# Patient Record
Sex: Female | Born: 1945
Health system: Southern US, Community
[De-identification: ages and names within clinical notes are randomized; demographics above are authoritative.]

## PROBLEM LIST (undated history)

## (undated) DIAGNOSIS — Z95828 Presence of other vascular implants and grafts: Secondary | ICD-10-CM

## (undated) DIAGNOSIS — Z9289 Personal history of other medical treatment: Secondary | ICD-10-CM

## (undated) DIAGNOSIS — E669 Obesity, unspecified: Secondary | ICD-10-CM

## (undated) DIAGNOSIS — I700 Atherosclerosis of aorta: Secondary | ICD-10-CM

## (undated) DIAGNOSIS — D696 Thrombocytopenia, unspecified: Secondary | ICD-10-CM

## (undated) DIAGNOSIS — R011 Cardiac murmur, unspecified: Secondary | ICD-10-CM

## (undated) DIAGNOSIS — I10 Essential (primary) hypertension: Secondary | ICD-10-CM

## (undated) DIAGNOSIS — N135 Crossing vessel and stricture of ureter without hydronephrosis: Secondary | ICD-10-CM

## (undated) DIAGNOSIS — R338 Other retention of urine: Secondary | ICD-10-CM

## (undated) DIAGNOSIS — I5189 Other ill-defined heart diseases: Secondary | ICD-10-CM

## (undated) DIAGNOSIS — Z9221 Personal history of antineoplastic chemotherapy: Secondary | ICD-10-CM

## (undated) DIAGNOSIS — M109 Gout, unspecified: Secondary | ICD-10-CM

## (undated) DIAGNOSIS — M75100 Unspecified rotator cuff tear or rupture of unspecified shoulder, not specified as traumatic: Secondary | ICD-10-CM

## (undated) DIAGNOSIS — I519 Heart disease, unspecified: Secondary | ICD-10-CM

## (undated) DIAGNOSIS — G709 Myoneural disorder, unspecified: Secondary | ICD-10-CM

## (undated) DIAGNOSIS — N1330 Unspecified hydronephrosis: Secondary | ICD-10-CM

## (undated) DIAGNOSIS — D759 Disease of blood and blood-forming organs, unspecified: Secondary | ICD-10-CM

## (undated) DIAGNOSIS — E785 Hyperlipidemia, unspecified: Secondary | ICD-10-CM

## (undated) DIAGNOSIS — I959 Hypotension, unspecified: Secondary | ICD-10-CM

## (undated) DIAGNOSIS — C8330 Diffuse large B-cell lymphoma, unspecified site: Secondary | ICD-10-CM

## (undated) DIAGNOSIS — R112 Nausea with vomiting, unspecified: Secondary | ICD-10-CM

## (undated) DIAGNOSIS — D539 Nutritional anemia, unspecified: Secondary | ICD-10-CM

## (undated) DIAGNOSIS — N2889 Other specified disorders of kidney and ureter: Secondary | ICD-10-CM

## (undated) DIAGNOSIS — M069 Rheumatoid arthritis, unspecified: Secondary | ICD-10-CM

## (undated) DIAGNOSIS — R591 Generalized enlarged lymph nodes: Secondary | ICD-10-CM

## (undated) DIAGNOSIS — Z8709 Personal history of other diseases of the respiratory system: Secondary | ICD-10-CM

## (undated) DIAGNOSIS — M7989 Other specified soft tissue disorders: Secondary | ICD-10-CM

## (undated) DIAGNOSIS — R161 Splenomegaly, not elsewhere classified: Secondary | ICD-10-CM

## (undated) DIAGNOSIS — H269 Unspecified cataract: Secondary | ICD-10-CM

## (undated) DIAGNOSIS — N179 Acute kidney failure, unspecified: Secondary | ICD-10-CM

## (undated) DIAGNOSIS — J90 Pleural effusion, not elsewhere classified: Secondary | ICD-10-CM

## (undated) DIAGNOSIS — A419 Sepsis, unspecified organism: Secondary | ICD-10-CM

## (undated) DIAGNOSIS — Z8719 Personal history of other diseases of the digestive system: Secondary | ICD-10-CM

## (undated) HISTORY — PX: APPENDECTOMY: SHX54

## (undated) HISTORY — PX: BREAST SURGERY: SHX581

## (undated) HISTORY — PX: OTHER SURGICAL HISTORY: SHX169

## (undated) HISTORY — PX: CATARACT EXTRACTION, BILATERAL: SHX1313

## (undated) HISTORY — DX: Other retention of urine: R33.8

## (undated) HISTORY — PX: LAPAROSCOPIC SALPINGO OOPHERECTOMY: SHX5927

## (undated) HISTORY — DX: Hypotension, unspecified: I95.9

## (undated) HISTORY — PX: CARPAL TUNNEL RELEASE: SHX101

## (undated) HISTORY — DX: Sepsis, unspecified organism: A41.9

## (undated) HISTORY — DX: Hypercalcemia: E83.52

## (undated) HISTORY — DX: Hypomagnesemia: E83.42

## (undated) HISTORY — DX: Other disorders of bilirubin metabolism: E80.6

## (undated) HISTORY — DX: Diffuse large B-cell lymphoma, unspecified site: C83.30

## (undated) HISTORY — DX: Disease of blood and blood-forming organs, unspecified: D75.9

## (undated) HISTORY — DX: Nutritional anemia, unspecified: D53.9

## (undated) HISTORY — DX: Acute kidney failure, unspecified: N17.9

## (undated) HISTORY — DX: Thrombocytopenia, unspecified: D69.6

## (undated) HISTORY — DX: Nausea with vomiting, unspecified: R11.2

## (undated) HISTORY — DX: Other disorders of phosphorus metabolism: E83.39

## (undated) HISTORY — DX: Generalized enlarged lymph nodes: R59.1

---

## 2011-12-21 DIAGNOSIS — Z86010 Personal history of colonic polyps: Secondary | ICD-10-CM | POA: Diagnosis not present

## 2011-12-21 DIAGNOSIS — K5730 Diverticulosis of large intestine without perforation or abscess without bleeding: Secondary | ICD-10-CM | POA: Diagnosis not present

## 2012-03-04 DIAGNOSIS — Z79899 Other long term (current) drug therapy: Secondary | ICD-10-CM | POA: Diagnosis not present

## 2012-03-04 DIAGNOSIS — M069 Rheumatoid arthritis, unspecified: Secondary | ICD-10-CM | POA: Diagnosis not present

## 2012-03-04 DIAGNOSIS — M109 Gout, unspecified: Secondary | ICD-10-CM | POA: Diagnosis not present

## 2012-03-04 DIAGNOSIS — M6530 Trigger finger, unspecified finger: Secondary | ICD-10-CM | POA: Diagnosis not present

## 2012-03-07 DIAGNOSIS — M109 Gout, unspecified: Secondary | ICD-10-CM | POA: Diagnosis not present

## 2012-03-07 DIAGNOSIS — M069 Rheumatoid arthritis, unspecified: Secondary | ICD-10-CM | POA: Diagnosis not present

## 2012-03-07 DIAGNOSIS — Z79899 Other long term (current) drug therapy: Secondary | ICD-10-CM | POA: Diagnosis not present

## 2012-04-25 DIAGNOSIS — I10 Essential (primary) hypertension: Secondary | ICD-10-CM | POA: Diagnosis not present

## 2012-04-25 DIAGNOSIS — Z79899 Other long term (current) drug therapy: Secondary | ICD-10-CM | POA: Diagnosis not present

## 2012-04-25 DIAGNOSIS — E785 Hyperlipidemia, unspecified: Secondary | ICD-10-CM | POA: Diagnosis not present

## 2012-05-07 DIAGNOSIS — H16049 Marginal corneal ulcer, unspecified eye: Secondary | ICD-10-CM | POA: Diagnosis not present

## 2012-05-21 DIAGNOSIS — M109 Gout, unspecified: Secondary | ICD-10-CM | POA: Diagnosis not present

## 2012-05-21 DIAGNOSIS — E785 Hyperlipidemia, unspecified: Secondary | ICD-10-CM | POA: Diagnosis not present

## 2012-05-21 DIAGNOSIS — Z79899 Other long term (current) drug therapy: Secondary | ICD-10-CM | POA: Diagnosis not present

## 2012-05-21 DIAGNOSIS — M069 Rheumatoid arthritis, unspecified: Secondary | ICD-10-CM | POA: Diagnosis not present

## 2012-05-29 DIAGNOSIS — M542 Cervicalgia: Secondary | ICD-10-CM | POA: Diagnosis not present

## 2012-05-29 DIAGNOSIS — R079 Chest pain, unspecified: Secondary | ICD-10-CM | POA: Diagnosis not present

## 2012-07-29 DIAGNOSIS — R6889 Other general symptoms and signs: Secondary | ICD-10-CM | POA: Diagnosis not present

## 2012-07-29 DIAGNOSIS — M069 Rheumatoid arthritis, unspecified: Secondary | ICD-10-CM | POA: Diagnosis not present

## 2012-07-29 DIAGNOSIS — M6530 Trigger finger, unspecified finger: Secondary | ICD-10-CM | POA: Diagnosis not present

## 2012-07-29 DIAGNOSIS — M109 Gout, unspecified: Secondary | ICD-10-CM | POA: Diagnosis not present

## 2012-08-21 DIAGNOSIS — Z79899 Other long term (current) drug therapy: Secondary | ICD-10-CM | POA: Diagnosis not present

## 2012-08-21 DIAGNOSIS — M069 Rheumatoid arthritis, unspecified: Secondary | ICD-10-CM | POA: Diagnosis not present

## 2012-09-18 DIAGNOSIS — R062 Wheezing: Secondary | ICD-10-CM | POA: Diagnosis not present

## 2012-09-18 DIAGNOSIS — J209 Acute bronchitis, unspecified: Secondary | ICD-10-CM | POA: Diagnosis not present

## 2012-09-18 DIAGNOSIS — R05 Cough: Secondary | ICD-10-CM | POA: Diagnosis not present

## 2012-10-08 DIAGNOSIS — H524 Presbyopia: Secondary | ICD-10-CM | POA: Diagnosis not present

## 2012-10-08 DIAGNOSIS — H269 Unspecified cataract: Secondary | ICD-10-CM | POA: Diagnosis not present

## 2012-11-19 DIAGNOSIS — L820 Inflamed seborrheic keratosis: Secondary | ICD-10-CM | POA: Diagnosis not present

## 2012-11-19 DIAGNOSIS — D2360 Other benign neoplasm of skin of unspecified upper limb, including shoulder: Secondary | ICD-10-CM | POA: Diagnosis not present

## 2012-12-02 DIAGNOSIS — Z79899 Other long term (current) drug therapy: Secondary | ICD-10-CM | POA: Diagnosis not present

## 2012-12-02 DIAGNOSIS — Z124 Encounter for screening for malignant neoplasm of cervix: Secondary | ICD-10-CM | POA: Diagnosis not present

## 2012-12-02 DIAGNOSIS — Z1231 Encounter for screening mammogram for malignant neoplasm of breast: Secondary | ICD-10-CM | POA: Diagnosis not present

## 2012-12-02 DIAGNOSIS — Z0000 Encounter for general adult medical examination without abnormal findings: Secondary | ICD-10-CM | POA: Diagnosis not present

## 2012-12-02 DIAGNOSIS — E785 Hyperlipidemia, unspecified: Secondary | ICD-10-CM | POA: Diagnosis not present

## 2012-12-02 DIAGNOSIS — M069 Rheumatoid arthritis, unspecified: Secondary | ICD-10-CM | POA: Diagnosis not present

## 2012-12-02 DIAGNOSIS — I10 Essential (primary) hypertension: Secondary | ICD-10-CM | POA: Diagnosis not present

## 2012-12-17 DIAGNOSIS — Z1231 Encounter for screening mammogram for malignant neoplasm of breast: Secondary | ICD-10-CM | POA: Diagnosis not present

## 2012-12-30 DIAGNOSIS — M109 Gout, unspecified: Secondary | ICD-10-CM | POA: Diagnosis not present

## 2012-12-30 DIAGNOSIS — M069 Rheumatoid arthritis, unspecified: Secondary | ICD-10-CM | POA: Diagnosis not present

## 2012-12-30 DIAGNOSIS — Z79899 Other long term (current) drug therapy: Secondary | ICD-10-CM | POA: Diagnosis not present

## 2013-03-06 DIAGNOSIS — J351 Hypertrophy of tonsils: Secondary | ICD-10-CM | POA: Diagnosis not present

## 2013-03-06 DIAGNOSIS — H6120 Impacted cerumen, unspecified ear: Secondary | ICD-10-CM | POA: Diagnosis not present

## 2013-03-06 DIAGNOSIS — H9319 Tinnitus, unspecified ear: Secondary | ICD-10-CM | POA: Diagnosis not present

## 2013-03-06 DIAGNOSIS — H9190 Unspecified hearing loss, unspecified ear: Secondary | ICD-10-CM | POA: Diagnosis not present

## 2013-03-10 DIAGNOSIS — M069 Rheumatoid arthritis, unspecified: Secondary | ICD-10-CM | POA: Diagnosis not present

## 2013-03-10 DIAGNOSIS — Z79899 Other long term (current) drug therapy: Secondary | ICD-10-CM | POA: Diagnosis not present

## 2013-05-20 DIAGNOSIS — M109 Gout, unspecified: Secondary | ICD-10-CM | POA: Diagnosis not present

## 2013-05-20 DIAGNOSIS — Z79899 Other long term (current) drug therapy: Secondary | ICD-10-CM | POA: Diagnosis not present

## 2013-05-20 DIAGNOSIS — M069 Rheumatoid arthritis, unspecified: Secondary | ICD-10-CM | POA: Diagnosis not present

## 2013-05-20 DIAGNOSIS — M6530 Trigger finger, unspecified finger: Secondary | ICD-10-CM | POA: Diagnosis not present

## 2013-06-02 DIAGNOSIS — E785 Hyperlipidemia, unspecified: Secondary | ICD-10-CM | POA: Diagnosis not present

## 2013-06-02 DIAGNOSIS — I10 Essential (primary) hypertension: Secondary | ICD-10-CM | POA: Diagnosis not present

## 2013-06-11 DIAGNOSIS — M6530 Trigger finger, unspecified finger: Secondary | ICD-10-CM | POA: Diagnosis not present

## 2013-06-11 DIAGNOSIS — M069 Rheumatoid arthritis, unspecified: Secondary | ICD-10-CM | POA: Diagnosis not present

## 2013-06-11 DIAGNOSIS — Z79899 Other long term (current) drug therapy: Secondary | ICD-10-CM | POA: Diagnosis not present

## 2013-06-11 DIAGNOSIS — M109 Gout, unspecified: Secondary | ICD-10-CM | POA: Diagnosis not present

## 2013-06-11 DIAGNOSIS — E785 Hyperlipidemia, unspecified: Secondary | ICD-10-CM | POA: Diagnosis not present

## 2013-06-24 DIAGNOSIS — H43819 Vitreous degeneration, unspecified eye: Secondary | ICD-10-CM | POA: Diagnosis not present

## 2013-09-12 DIAGNOSIS — Z23 Encounter for immunization: Secondary | ICD-10-CM | POA: Diagnosis not present

## 2013-09-17 DIAGNOSIS — M109 Gout, unspecified: Secondary | ICD-10-CM | POA: Diagnosis not present

## 2013-09-17 DIAGNOSIS — M6530 Trigger finger, unspecified finger: Secondary | ICD-10-CM | POA: Diagnosis not present

## 2013-09-17 DIAGNOSIS — M069 Rheumatoid arthritis, unspecified: Secondary | ICD-10-CM | POA: Diagnosis not present

## 2013-09-17 DIAGNOSIS — Z79899 Other long term (current) drug therapy: Secondary | ICD-10-CM | POA: Diagnosis not present

## 2013-10-07 DIAGNOSIS — M542 Cervicalgia: Secondary | ICD-10-CM | POA: Diagnosis not present

## 2013-10-09 DIAGNOSIS — H269 Unspecified cataract: Secondary | ICD-10-CM | POA: Diagnosis not present

## 2013-10-09 DIAGNOSIS — H43819 Vitreous degeneration, unspecified eye: Secondary | ICD-10-CM | POA: Diagnosis not present

## 2013-10-09 DIAGNOSIS — H524 Presbyopia: Secondary | ICD-10-CM | POA: Diagnosis not present

## 2013-10-21 DIAGNOSIS — M6530 Trigger finger, unspecified finger: Secondary | ICD-10-CM | POA: Diagnosis not present

## 2013-10-21 DIAGNOSIS — M109 Gout, unspecified: Secondary | ICD-10-CM | POA: Diagnosis not present

## 2013-10-21 DIAGNOSIS — M542 Cervicalgia: Secondary | ICD-10-CM | POA: Diagnosis not present

## 2013-10-21 DIAGNOSIS — M069 Rheumatoid arthritis, unspecified: Secondary | ICD-10-CM | POA: Diagnosis not present

## 2013-10-23 DIAGNOSIS — M5030 Other cervical disc degeneration, unspecified cervical region: Secondary | ICD-10-CM | POA: Diagnosis not present

## 2013-10-23 DIAGNOSIS — M47812 Spondylosis without myelopathy or radiculopathy, cervical region: Secondary | ICD-10-CM | POA: Diagnosis not present

## 2013-10-23 DIAGNOSIS — M542 Cervicalgia: Secondary | ICD-10-CM | POA: Diagnosis not present

## 2013-11-10 DIAGNOSIS — M5412 Radiculopathy, cervical region: Secondary | ICD-10-CM | POA: Diagnosis not present

## 2013-11-10 DIAGNOSIS — M542 Cervicalgia: Secondary | ICD-10-CM | POA: Diagnosis not present

## 2013-12-25 DIAGNOSIS — Z0000 Encounter for general adult medical examination without abnormal findings: Secondary | ICD-10-CM | POA: Diagnosis not present

## 2013-12-25 DIAGNOSIS — I10 Essential (primary) hypertension: Secondary | ICD-10-CM | POA: Diagnosis not present

## 2013-12-25 DIAGNOSIS — E785 Hyperlipidemia, unspecified: Secondary | ICD-10-CM | POA: Diagnosis not present

## 2013-12-25 DIAGNOSIS — Z1231 Encounter for screening mammogram for malignant neoplasm of breast: Secondary | ICD-10-CM | POA: Diagnosis not present

## 2013-12-25 DIAGNOSIS — M069 Rheumatoid arthritis, unspecified: Secondary | ICD-10-CM | POA: Diagnosis not present

## 2013-12-25 DIAGNOSIS — Z79899 Other long term (current) drug therapy: Secondary | ICD-10-CM | POA: Diagnosis not present

## 2013-12-31 DIAGNOSIS — Z23 Encounter for immunization: Secondary | ICD-10-CM | POA: Diagnosis not present

## 2013-12-31 DIAGNOSIS — Z1231 Encounter for screening mammogram for malignant neoplasm of breast: Secondary | ICD-10-CM | POA: Diagnosis not present

## 2014-03-05 DIAGNOSIS — M069 Rheumatoid arthritis, unspecified: Secondary | ICD-10-CM | POA: Diagnosis not present

## 2014-03-05 DIAGNOSIS — M109 Gout, unspecified: Secondary | ICD-10-CM | POA: Diagnosis not present

## 2014-03-05 DIAGNOSIS — M542 Cervicalgia: Secondary | ICD-10-CM | POA: Diagnosis not present

## 2014-03-25 DIAGNOSIS — Z79899 Other long term (current) drug therapy: Secondary | ICD-10-CM | POA: Diagnosis not present

## 2014-03-25 DIAGNOSIS — M069 Rheumatoid arthritis, unspecified: Secondary | ICD-10-CM | POA: Diagnosis not present

## 2014-04-15 DIAGNOSIS — D2360 Other benign neoplasm of skin of unspecified upper limb, including shoulder: Secondary | ICD-10-CM | POA: Diagnosis not present

## 2014-04-15 DIAGNOSIS — L821 Other seborrheic keratosis: Secondary | ICD-10-CM | POA: Diagnosis not present

## 2014-05-27 DIAGNOSIS — Z79899 Other long term (current) drug therapy: Secondary | ICD-10-CM | POA: Diagnosis not present

## 2014-05-27 DIAGNOSIS — I10 Essential (primary) hypertension: Secondary | ICD-10-CM | POA: Diagnosis not present

## 2014-05-27 DIAGNOSIS — E785 Hyperlipidemia, unspecified: Secondary | ICD-10-CM | POA: Diagnosis not present

## 2014-06-16 DIAGNOSIS — M069 Rheumatoid arthritis, unspecified: Secondary | ICD-10-CM | POA: Diagnosis not present

## 2014-06-16 DIAGNOSIS — Z79899 Other long term (current) drug therapy: Secondary | ICD-10-CM | POA: Diagnosis not present

## 2014-06-16 DIAGNOSIS — E785 Hyperlipidemia, unspecified: Secondary | ICD-10-CM | POA: Diagnosis not present

## 2014-08-05 DIAGNOSIS — M069 Rheumatoid arthritis, unspecified: Secondary | ICD-10-CM | POA: Diagnosis not present

## 2014-08-05 DIAGNOSIS — M25449 Effusion, unspecified hand: Secondary | ICD-10-CM | POA: Diagnosis not present

## 2014-08-05 DIAGNOSIS — M109 Gout, unspecified: Secondary | ICD-10-CM | POA: Diagnosis not present

## 2014-08-05 DIAGNOSIS — R7301 Impaired fasting glucose: Secondary | ICD-10-CM | POA: Diagnosis not present

## 2014-09-04 DIAGNOSIS — Z23 Encounter for immunization: Secondary | ICD-10-CM | POA: Diagnosis not present

## 2014-09-10 DIAGNOSIS — M109 Gout, unspecified: Secondary | ICD-10-CM | POA: Diagnosis not present

## 2014-09-10 DIAGNOSIS — R7301 Impaired fasting glucose: Secondary | ICD-10-CM | POA: Diagnosis not present

## 2014-09-10 DIAGNOSIS — M25449 Effusion, unspecified hand: Secondary | ICD-10-CM | POA: Diagnosis not present

## 2014-09-10 DIAGNOSIS — M069 Rheumatoid arthritis, unspecified: Secondary | ICD-10-CM | POA: Diagnosis not present

## 2014-09-10 DIAGNOSIS — Z79899 Other long term (current) drug therapy: Secondary | ICD-10-CM | POA: Diagnosis not present

## 2014-10-08 DIAGNOSIS — H2513 Age-related nuclear cataract, bilateral: Secondary | ICD-10-CM | POA: Diagnosis not present

## 2014-12-17 DIAGNOSIS — I10 Essential (primary) hypertension: Secondary | ICD-10-CM | POA: Diagnosis not present

## 2014-12-17 DIAGNOSIS — R7301 Impaired fasting glucose: Secondary | ICD-10-CM | POA: Diagnosis not present

## 2014-12-17 DIAGNOSIS — M25449 Effusion, unspecified hand: Secondary | ICD-10-CM | POA: Diagnosis not present

## 2014-12-17 DIAGNOSIS — Z79899 Other long term (current) drug therapy: Secondary | ICD-10-CM | POA: Diagnosis not present

## 2014-12-17 DIAGNOSIS — E785 Hyperlipidemia, unspecified: Secondary | ICD-10-CM | POA: Diagnosis not present

## 2014-12-17 DIAGNOSIS — M1000 Idiopathic gout, unspecified site: Secondary | ICD-10-CM | POA: Diagnosis not present

## 2014-12-17 DIAGNOSIS — M069 Rheumatoid arthritis, unspecified: Secondary | ICD-10-CM | POA: Diagnosis not present

## 2014-12-29 DIAGNOSIS — Z1231 Encounter for screening mammogram for malignant neoplasm of breast: Secondary | ICD-10-CM | POA: Diagnosis not present

## 2014-12-29 DIAGNOSIS — E785 Hyperlipidemia, unspecified: Secondary | ICD-10-CM | POA: Diagnosis not present

## 2014-12-29 DIAGNOSIS — R8290 Unspecified abnormal findings in urine: Secondary | ICD-10-CM | POA: Diagnosis not present

## 2014-12-29 DIAGNOSIS — M069 Rheumatoid arthritis, unspecified: Secondary | ICD-10-CM | POA: Diagnosis not present

## 2014-12-29 DIAGNOSIS — I10 Essential (primary) hypertension: Secondary | ICD-10-CM | POA: Diagnosis not present

## 2014-12-29 DIAGNOSIS — Z0001 Encounter for general adult medical examination with abnormal findings: Secondary | ICD-10-CM | POA: Diagnosis not present

## 2014-12-29 DIAGNOSIS — R7301 Impaired fasting glucose: Secondary | ICD-10-CM | POA: Diagnosis not present

## 2014-12-29 DIAGNOSIS — Z124 Encounter for screening for malignant neoplasm of cervix: Secondary | ICD-10-CM | POA: Diagnosis not present

## 2015-01-01 DIAGNOSIS — Z1231 Encounter for screening mammogram for malignant neoplasm of breast: Secondary | ICD-10-CM | POA: Diagnosis not present

## 2015-01-05 DIAGNOSIS — R634 Abnormal weight loss: Secondary | ICD-10-CM | POA: Diagnosis not present

## 2015-01-05 DIAGNOSIS — Z7952 Long term (current) use of systemic steroids: Secondary | ICD-10-CM | POA: Diagnosis not present

## 2015-01-05 DIAGNOSIS — M109 Gout, unspecified: Secondary | ICD-10-CM | POA: Diagnosis not present

## 2015-01-05 DIAGNOSIS — M069 Rheumatoid arthritis, unspecified: Secondary | ICD-10-CM | POA: Diagnosis not present

## 2015-01-05 DIAGNOSIS — M7062 Trochanteric bursitis, left hip: Secondary | ICD-10-CM | POA: Diagnosis not present

## 2015-01-05 DIAGNOSIS — M25441 Effusion, right hand: Secondary | ICD-10-CM | POA: Diagnosis not present

## 2015-03-01 DIAGNOSIS — D125 Benign neoplasm of sigmoid colon: Secondary | ICD-10-CM | POA: Diagnosis not present

## 2015-03-01 DIAGNOSIS — D122 Benign neoplasm of ascending colon: Secondary | ICD-10-CM | POA: Diagnosis not present

## 2015-03-01 DIAGNOSIS — Z1211 Encounter for screening for malignant neoplasm of colon: Secondary | ICD-10-CM | POA: Diagnosis not present

## 2015-03-01 DIAGNOSIS — Z86010 Personal history of colonic polyps: Secondary | ICD-10-CM | POA: Diagnosis not present

## 2015-03-01 DIAGNOSIS — K5730 Diverticulosis of large intestine without perforation or abscess without bleeding: Secondary | ICD-10-CM | POA: Diagnosis not present

## 2015-03-16 DIAGNOSIS — M7062 Trochanteric bursitis, left hip: Secondary | ICD-10-CM | POA: Diagnosis not present

## 2015-03-16 DIAGNOSIS — M0609 Rheumatoid arthritis without rheumatoid factor, multiple sites: Secondary | ICD-10-CM | POA: Diagnosis not present

## 2015-03-16 DIAGNOSIS — M25441 Effusion, right hand: Secondary | ICD-10-CM | POA: Diagnosis not present

## 2015-03-16 DIAGNOSIS — Z7952 Long term (current) use of systemic steroids: Secondary | ICD-10-CM | POA: Diagnosis not present

## 2015-03-16 DIAGNOSIS — R7301 Impaired fasting glucose: Secondary | ICD-10-CM | POA: Diagnosis not present

## 2015-03-16 DIAGNOSIS — E785 Hyperlipidemia, unspecified: Secondary | ICD-10-CM | POA: Diagnosis not present

## 2015-03-22 DIAGNOSIS — L728 Other follicular cysts of the skin and subcutaneous tissue: Secondary | ICD-10-CM | POA: Diagnosis not present

## 2015-04-05 DIAGNOSIS — M25441 Effusion, right hand: Secondary | ICD-10-CM | POA: Diagnosis not present

## 2015-04-05 DIAGNOSIS — J069 Acute upper respiratory infection, unspecified: Secondary | ICD-10-CM | POA: Diagnosis not present

## 2015-04-05 DIAGNOSIS — M059 Rheumatoid arthritis with rheumatoid factor, unspecified: Secondary | ICD-10-CM | POA: Diagnosis not present

## 2015-04-05 DIAGNOSIS — R634 Abnormal weight loss: Secondary | ICD-10-CM | POA: Diagnosis not present

## 2015-04-05 DIAGNOSIS — M109 Gout, unspecified: Secondary | ICD-10-CM | POA: Diagnosis not present

## 2015-04-19 DIAGNOSIS — R634 Abnormal weight loss: Secondary | ICD-10-CM | POA: Diagnosis not present

## 2015-04-19 DIAGNOSIS — M25441 Effusion, right hand: Secondary | ICD-10-CM | POA: Diagnosis not present

## 2015-04-19 DIAGNOSIS — M0570 Rheumatoid arthritis with rheumatoid factor of unspecified site without organ or systems involvement: Secondary | ICD-10-CM | POA: Diagnosis not present

## 2015-04-19 DIAGNOSIS — K069 Disorder of gingiva and edentulous alveolar ridge, unspecified: Secondary | ICD-10-CM | POA: Diagnosis not present

## 2015-07-12 DIAGNOSIS — M109 Gout, unspecified: Secondary | ICD-10-CM | POA: Diagnosis not present

## 2015-07-12 DIAGNOSIS — Z79899 Other long term (current) drug therapy: Secondary | ICD-10-CM | POA: Diagnosis not present

## 2015-07-12 DIAGNOSIS — E785 Hyperlipidemia, unspecified: Secondary | ICD-10-CM | POA: Diagnosis not present

## 2015-07-12 DIAGNOSIS — M069 Rheumatoid arthritis, unspecified: Secondary | ICD-10-CM | POA: Diagnosis not present

## 2015-07-12 DIAGNOSIS — I10 Essential (primary) hypertension: Secondary | ICD-10-CM | POA: Diagnosis not present

## 2015-08-10 DIAGNOSIS — Z79899 Other long term (current) drug therapy: Secondary | ICD-10-CM | POA: Diagnosis not present

## 2015-08-10 DIAGNOSIS — M25431 Effusion, right wrist: Secondary | ICD-10-CM | POA: Diagnosis not present

## 2015-08-10 DIAGNOSIS — M109 Gout, unspecified: Secondary | ICD-10-CM | POA: Diagnosis not present

## 2015-08-10 DIAGNOSIS — M059 Rheumatoid arthritis with rheumatoid factor, unspecified: Secondary | ICD-10-CM | POA: Diagnosis not present

## 2015-08-25 DIAGNOSIS — Z23 Encounter for immunization: Secondary | ICD-10-CM | POA: Diagnosis not present

## 2015-10-13 DIAGNOSIS — H2513 Age-related nuclear cataract, bilateral: Secondary | ICD-10-CM | POA: Diagnosis not present

## 2015-10-15 DIAGNOSIS — Z79899 Other long term (current) drug therapy: Secondary | ICD-10-CM | POA: Diagnosis not present

## 2015-10-15 DIAGNOSIS — M0570 Rheumatoid arthritis with rheumatoid factor of unspecified site without organ or systems involvement: Secondary | ICD-10-CM | POA: Diagnosis not present

## 2016-01-04 DIAGNOSIS — Z79899 Other long term (current) drug therapy: Secondary | ICD-10-CM | POA: Diagnosis not present

## 2016-01-04 DIAGNOSIS — R7301 Impaired fasting glucose: Secondary | ICD-10-CM | POA: Diagnosis not present

## 2016-01-04 DIAGNOSIS — Z1159 Encounter for screening for other viral diseases: Secondary | ICD-10-CM | POA: Diagnosis not present

## 2016-01-04 DIAGNOSIS — M109 Gout, unspecified: Secondary | ICD-10-CM | POA: Diagnosis not present

## 2016-01-04 DIAGNOSIS — Z0000 Encounter for general adult medical examination without abnormal findings: Secondary | ICD-10-CM | POA: Diagnosis not present

## 2016-01-04 DIAGNOSIS — Z1231 Encounter for screening mammogram for malignant neoplasm of breast: Secondary | ICD-10-CM | POA: Diagnosis not present

## 2016-01-04 DIAGNOSIS — I10 Essential (primary) hypertension: Secondary | ICD-10-CM | POA: Diagnosis not present

## 2016-01-04 DIAGNOSIS — E785 Hyperlipidemia, unspecified: Secondary | ICD-10-CM | POA: Diagnosis not present

## 2016-01-05 DIAGNOSIS — M542 Cervicalgia: Secondary | ICD-10-CM | POA: Diagnosis not present

## 2016-01-05 DIAGNOSIS — Z79899 Other long term (current) drug therapy: Secondary | ICD-10-CM | POA: Diagnosis not present

## 2016-01-05 DIAGNOSIS — M109 Gout, unspecified: Secondary | ICD-10-CM | POA: Diagnosis not present

## 2016-01-05 DIAGNOSIS — M25441 Effusion, right hand: Secondary | ICD-10-CM | POA: Diagnosis not present

## 2016-01-05 DIAGNOSIS — M0570 Rheumatoid arthritis with rheumatoid factor of unspecified site without organ or systems involvement: Secondary | ICD-10-CM | POA: Diagnosis not present

## 2016-01-05 DIAGNOSIS — M25442 Effusion, left hand: Secondary | ICD-10-CM | POA: Diagnosis not present

## 2016-01-06 DIAGNOSIS — Z1231 Encounter for screening mammogram for malignant neoplasm of breast: Secondary | ICD-10-CM | POA: Diagnosis not present

## 2016-01-18 DIAGNOSIS — N63 Unspecified lump in breast: Secondary | ICD-10-CM | POA: Diagnosis not present

## 2016-01-18 DIAGNOSIS — R928 Other abnormal and inconclusive findings on diagnostic imaging of breast: Secondary | ICD-10-CM | POA: Diagnosis not present

## 2016-01-18 DIAGNOSIS — N6001 Solitary cyst of right breast: Secondary | ICD-10-CM | POA: Diagnosis not present

## 2016-04-07 DIAGNOSIS — M0570 Rheumatoid arthritis with rheumatoid factor of unspecified site without organ or systems involvement: Secondary | ICD-10-CM | POA: Diagnosis not present

## 2016-04-07 DIAGNOSIS — Z79899 Other long term (current) drug therapy: Secondary | ICD-10-CM | POA: Diagnosis not present

## 2016-05-04 DIAGNOSIS — Z79899 Other long term (current) drug therapy: Secondary | ICD-10-CM | POA: Diagnosis not present

## 2016-05-04 DIAGNOSIS — M0570 Rheumatoid arthritis with rheumatoid factor of unspecified site without organ or systems involvement: Secondary | ICD-10-CM | POA: Diagnosis not present

## 2016-05-04 DIAGNOSIS — F17200 Nicotine dependence, unspecified, uncomplicated: Secondary | ICD-10-CM | POA: Diagnosis not present

## 2016-07-19 DIAGNOSIS — M0570 Rheumatoid arthritis with rheumatoid factor of unspecified site without organ or systems involvement: Secondary | ICD-10-CM | POA: Diagnosis not present

## 2016-07-19 DIAGNOSIS — Z79899 Other long term (current) drug therapy: Secondary | ICD-10-CM | POA: Diagnosis not present

## 2016-09-12 DIAGNOSIS — Z23 Encounter for immunization: Secondary | ICD-10-CM | POA: Diagnosis not present

## 2016-09-21 DIAGNOSIS — Z79899 Other long term (current) drug therapy: Secondary | ICD-10-CM | POA: Diagnosis not present

## 2016-09-21 DIAGNOSIS — F17200 Nicotine dependence, unspecified, uncomplicated: Secondary | ICD-10-CM | POA: Diagnosis not present

## 2016-09-21 DIAGNOSIS — M109 Gout, unspecified: Secondary | ICD-10-CM | POA: Diagnosis not present

## 2016-09-21 DIAGNOSIS — M0570 Rheumatoid arthritis with rheumatoid factor of unspecified site without organ or systems involvement: Secondary | ICD-10-CM | POA: Diagnosis not present

## 2016-10-23 DIAGNOSIS — H43811 Vitreous degeneration, right eye: Secondary | ICD-10-CM | POA: Diagnosis not present

## 2016-10-23 DIAGNOSIS — H2513 Age-related nuclear cataract, bilateral: Secondary | ICD-10-CM | POA: Diagnosis not present

## 2016-10-24 DIAGNOSIS — R05 Cough: Secondary | ICD-10-CM | POA: Diagnosis not present

## 2016-10-24 DIAGNOSIS — J40 Bronchitis, not specified as acute or chronic: Secondary | ICD-10-CM | POA: Diagnosis not present

## 2016-10-24 DIAGNOSIS — R062 Wheezing: Secondary | ICD-10-CM | POA: Diagnosis not present

## 2016-10-31 DIAGNOSIS — H4302 Vitreous prolapse, left eye: Secondary | ICD-10-CM | POA: Diagnosis not present

## 2016-11-14 DIAGNOSIS — Z79899 Other long term (current) drug therapy: Secondary | ICD-10-CM | POA: Diagnosis not present

## 2016-11-14 DIAGNOSIS — M109 Gout, unspecified: Secondary | ICD-10-CM | POA: Diagnosis not present

## 2016-11-14 DIAGNOSIS — M0570 Rheumatoid arthritis with rheumatoid factor of unspecified site without organ or systems involvement: Secondary | ICD-10-CM | POA: Diagnosis not present

## 2016-11-28 DIAGNOSIS — H4302 Vitreous prolapse, left eye: Secondary | ICD-10-CM | POA: Diagnosis not present

## 2017-02-19 DIAGNOSIS — Z79899 Other long term (current) drug therapy: Secondary | ICD-10-CM | POA: Diagnosis not present

## 2017-02-19 DIAGNOSIS — M25431 Effusion, right wrist: Secondary | ICD-10-CM | POA: Diagnosis not present

## 2017-02-19 DIAGNOSIS — M0570 Rheumatoid arthritis with rheumatoid factor of unspecified site without organ or systems involvement: Secondary | ICD-10-CM | POA: Diagnosis not present

## 2017-02-19 DIAGNOSIS — M109 Gout, unspecified: Secondary | ICD-10-CM | POA: Diagnosis not present

## 2017-02-21 DIAGNOSIS — Z79899 Other long term (current) drug therapy: Secondary | ICD-10-CM | POA: Diagnosis not present

## 2017-02-21 DIAGNOSIS — M109 Gout, unspecified: Secondary | ICD-10-CM | POA: Diagnosis not present

## 2017-02-21 DIAGNOSIS — E785 Hyperlipidemia, unspecified: Secondary | ICD-10-CM | POA: Diagnosis not present

## 2017-02-21 DIAGNOSIS — I10 Essential (primary) hypertension: Secondary | ICD-10-CM | POA: Diagnosis not present

## 2017-02-21 DIAGNOSIS — M069 Rheumatoid arthritis, unspecified: Secondary | ICD-10-CM | POA: Diagnosis not present

## 2017-02-21 DIAGNOSIS — Z0001 Encounter for general adult medical examination with abnormal findings: Secondary | ICD-10-CM | POA: Diagnosis not present

## 2017-02-21 DIAGNOSIS — Z1389 Encounter for screening for other disorder: Secondary | ICD-10-CM | POA: Diagnosis not present

## 2017-02-21 DIAGNOSIS — Z1231 Encounter for screening mammogram for malignant neoplasm of breast: Secondary | ICD-10-CM | POA: Diagnosis not present

## 2017-02-21 DIAGNOSIS — H6123 Impacted cerumen, bilateral: Secondary | ICD-10-CM | POA: Diagnosis not present

## 2017-02-21 DIAGNOSIS — R7301 Impaired fasting glucose: Secondary | ICD-10-CM | POA: Diagnosis not present

## 2017-02-23 DIAGNOSIS — H60311 Diffuse otitis externa, right ear: Secondary | ICD-10-CM | POA: Diagnosis not present

## 2017-02-23 DIAGNOSIS — J342 Deviated nasal septum: Secondary | ICD-10-CM | POA: Diagnosis not present

## 2017-02-23 DIAGNOSIS — H6123 Impacted cerumen, bilateral: Secondary | ICD-10-CM | POA: Diagnosis not present

## 2017-02-23 DIAGNOSIS — H9193 Unspecified hearing loss, bilateral: Secondary | ICD-10-CM | POA: Diagnosis not present

## 2017-02-23 DIAGNOSIS — J3489 Other specified disorders of nose and nasal sinuses: Secondary | ICD-10-CM | POA: Diagnosis not present

## 2017-02-23 DIAGNOSIS — H73001 Acute myringitis, right ear: Secondary | ICD-10-CM | POA: Diagnosis not present

## 2017-03-06 DIAGNOSIS — Z1231 Encounter for screening mammogram for malignant neoplasm of breast: Secondary | ICD-10-CM | POA: Diagnosis not present

## 2017-05-03 DIAGNOSIS — K136 Irritative hyperplasia of oral mucosa: Secondary | ICD-10-CM | POA: Diagnosis not present

## 2017-07-06 ENCOUNTER — Other Ambulatory Visit: Payer: Self-pay

## 2017-07-19 DIAGNOSIS — I10 Essential (primary) hypertension: Secondary | ICD-10-CM | POA: Diagnosis not present

## 2017-07-19 DIAGNOSIS — R51 Headache: Secondary | ICD-10-CM | POA: Diagnosis not present

## 2017-07-23 DIAGNOSIS — R51 Headache: Secondary | ICD-10-CM | POA: Diagnosis not present

## 2017-07-23 DIAGNOSIS — I10 Essential (primary) hypertension: Secondary | ICD-10-CM | POA: Diagnosis not present

## 2017-08-13 DIAGNOSIS — I10 Essential (primary) hypertension: Secondary | ICD-10-CM | POA: Diagnosis not present

## 2017-08-13 DIAGNOSIS — R5383 Other fatigue: Secondary | ICD-10-CM | POA: Diagnosis not present

## 2017-08-13 DIAGNOSIS — M109 Gout, unspecified: Secondary | ICD-10-CM | POA: Diagnosis not present

## 2017-08-13 DIAGNOSIS — R110 Nausea: Secondary | ICD-10-CM | POA: Diagnosis not present

## 2017-08-13 DIAGNOSIS — Z79899 Other long term (current) drug therapy: Secondary | ICD-10-CM | POA: Diagnosis not present

## 2017-08-13 DIAGNOSIS — R51 Headache: Secondary | ICD-10-CM | POA: Diagnosis not present

## 2017-08-13 DIAGNOSIS — M069 Rheumatoid arthritis, unspecified: Secondary | ICD-10-CM | POA: Diagnosis not present

## 2017-08-13 DIAGNOSIS — M62838 Other muscle spasm: Secondary | ICD-10-CM | POA: Diagnosis not present

## 2017-08-13 DIAGNOSIS — M791 Myalgia: Secondary | ICD-10-CM | POA: Diagnosis not present

## 2017-08-13 DIAGNOSIS — R0602 Shortness of breath: Secondary | ICD-10-CM | POA: Diagnosis not present

## 2017-08-14 DIAGNOSIS — D649 Anemia, unspecified: Secondary | ICD-10-CM | POA: Diagnosis not present

## 2017-08-14 DIAGNOSIS — R748 Abnormal levels of other serum enzymes: Secondary | ICD-10-CM | POA: Diagnosis not present

## 2017-08-14 DIAGNOSIS — R945 Abnormal results of liver function studies: Secondary | ICD-10-CM | POA: Diagnosis not present

## 2017-08-14 DIAGNOSIS — D696 Thrombocytopenia, unspecified: Secondary | ICD-10-CM | POA: Diagnosis not present

## 2017-08-14 DIAGNOSIS — R110 Nausea: Secondary | ICD-10-CM | POA: Diagnosis not present

## 2017-08-14 DIAGNOSIS — R1084 Generalized abdominal pain: Secondary | ICD-10-CM | POA: Diagnosis not present

## 2017-08-14 DIAGNOSIS — R0602 Shortness of breath: Secondary | ICD-10-CM | POA: Diagnosis not present

## 2017-08-14 DIAGNOSIS — R5383 Other fatigue: Secondary | ICD-10-CM | POA: Diagnosis not present

## 2017-08-16 DIAGNOSIS — N1330 Unspecified hydronephrosis: Secondary | ICD-10-CM | POA: Diagnosis not present

## 2017-08-18 DIAGNOSIS — N2889 Other specified disorders of kidney and ureter: Secondary | ICD-10-CM

## 2017-08-18 HISTORY — DX: Other specified disorders of kidney and ureter: N28.89

## 2017-08-20 ENCOUNTER — Encounter (HOSPITAL_COMMUNITY): Payer: Self-pay | Admitting: *Deleted

## 2017-08-20 ENCOUNTER — Emergency Department (HOSPITAL_COMMUNITY): Payer: Medicare Other

## 2017-08-20 ENCOUNTER — Inpatient Hospital Stay (HOSPITAL_COMMUNITY)
Admission: EM | Admit: 2017-08-20 | Discharge: 2017-09-08 | DRG: 853 | Disposition: A | Discharge status: Rehabilitation Facility | Payer: Medicare Other | Attending: Nephrology | Admitting: Nephrology

## 2017-08-20 DIAGNOSIS — R2681 Unsteadiness on feet: Secondary | ICD-10-CM | POA: Diagnosis not present

## 2017-08-20 DIAGNOSIS — A419 Sepsis, unspecified organism: Secondary | ICD-10-CM | POA: Diagnosis not present

## 2017-08-20 DIAGNOSIS — K5900 Constipation, unspecified: Secondary | ICD-10-CM | POA: Diagnosis present

## 2017-08-20 DIAGNOSIS — D759 Disease of blood and blood-forming organs, unspecified: Secondary | ICD-10-CM | POA: Diagnosis not present

## 2017-08-20 DIAGNOSIS — I700 Atherosclerosis of aorta: Secondary | ICD-10-CM | POA: Diagnosis not present

## 2017-08-20 DIAGNOSIS — N179 Acute kidney failure, unspecified: Secondary | ICD-10-CM | POA: Diagnosis present

## 2017-08-20 DIAGNOSIS — I5041 Acute combined systolic (congestive) and diastolic (congestive) heart failure: Secondary | ICD-10-CM | POA: Diagnosis not present

## 2017-08-20 DIAGNOSIS — N136 Pyonephrosis: Secondary | ICD-10-CM | POA: Diagnosis present

## 2017-08-20 DIAGNOSIS — R410 Disorientation, unspecified: Secondary | ICD-10-CM | POA: Diagnosis not present

## 2017-08-20 DIAGNOSIS — R197 Diarrhea, unspecified: Secondary | ICD-10-CM | POA: Diagnosis not present

## 2017-08-20 DIAGNOSIS — R221 Localized swelling, mass and lump, neck: Secondary | ICD-10-CM | POA: Diagnosis not present

## 2017-08-20 DIAGNOSIS — M069 Rheumatoid arthritis, unspecified: Secondary | ICD-10-CM | POA: Diagnosis present

## 2017-08-20 DIAGNOSIS — D6959 Other secondary thrombocytopenia: Secondary | ICD-10-CM | POA: Diagnosis present

## 2017-08-20 DIAGNOSIS — R1110 Vomiting, unspecified: Secondary | ICD-10-CM | POA: Diagnosis not present

## 2017-08-20 DIAGNOSIS — R509 Fever, unspecified: Secondary | ICD-10-CM | POA: Diagnosis not present

## 2017-08-20 DIAGNOSIS — I959 Hypotension, unspecified: Secondary | ICD-10-CM | POA: Diagnosis present

## 2017-08-20 DIAGNOSIS — E871 Hypo-osmolality and hyponatremia: Secondary | ICD-10-CM | POA: Diagnosis not present

## 2017-08-20 DIAGNOSIS — Z6841 Body Mass Index (BMI) 40.0 and over, adult: Secondary | ICD-10-CM

## 2017-08-20 DIAGNOSIS — E875 Hyperkalemia: Secondary | ICD-10-CM | POA: Diagnosis not present

## 2017-08-20 DIAGNOSIS — M109 Gout, unspecified: Secondary | ICD-10-CM | POA: Diagnosis present

## 2017-08-20 DIAGNOSIS — J9811 Atelectasis: Secondary | ICD-10-CM | POA: Diagnosis not present

## 2017-08-20 DIAGNOSIS — R109 Unspecified abdominal pain: Secondary | ICD-10-CM | POA: Diagnosis not present

## 2017-08-20 DIAGNOSIS — Z823 Family history of stroke: Secondary | ICD-10-CM | POA: Diagnosis not present

## 2017-08-20 DIAGNOSIS — R591 Generalized enlarged lymph nodes: Secondary | ICD-10-CM

## 2017-08-20 DIAGNOSIS — F17200 Nicotine dependence, unspecified, uncomplicated: Secondary | ICD-10-CM | POA: Diagnosis present

## 2017-08-20 DIAGNOSIS — R05 Cough: Secondary | ICD-10-CM | POA: Diagnosis present

## 2017-08-20 DIAGNOSIS — J9601 Acute respiratory failure with hypoxia: Secondary | ICD-10-CM | POA: Diagnosis not present

## 2017-08-20 DIAGNOSIS — M6281 Muscle weakness (generalized): Secondary | ICD-10-CM | POA: Diagnosis not present

## 2017-08-20 DIAGNOSIS — Z5111 Encounter for antineoplastic chemotherapy: Secondary | ICD-10-CM | POA: Diagnosis not present

## 2017-08-20 DIAGNOSIS — I898 Other specified noninfective disorders of lymphatic vessels and lymph nodes: Secondary | ICD-10-CM | POA: Diagnosis not present

## 2017-08-20 DIAGNOSIS — R112 Nausea with vomiting, unspecified: Secondary | ICD-10-CM | POA: Diagnosis not present

## 2017-08-20 DIAGNOSIS — Z7401 Bed confinement status: Secondary | ICD-10-CM

## 2017-08-20 DIAGNOSIS — E876 Hypokalemia: Secondary | ICD-10-CM | POA: Diagnosis present

## 2017-08-20 DIAGNOSIS — J189 Pneumonia, unspecified organism: Secondary | ICD-10-CM | POA: Diagnosis present

## 2017-08-20 DIAGNOSIS — C8338 Diffuse large B-cell lymphoma, lymph nodes of multiple sites: Secondary | ICD-10-CM

## 2017-08-20 DIAGNOSIS — N3000 Acute cystitis without hematuria: Secondary | ICD-10-CM

## 2017-08-20 DIAGNOSIS — Q313 Laryngocele: Secondary | ICD-10-CM | POA: Diagnosis not present

## 2017-08-20 DIAGNOSIS — D500 Iron deficiency anemia secondary to blood loss (chronic): Secondary | ICD-10-CM | POA: Diagnosis not present

## 2017-08-20 DIAGNOSIS — J439 Emphysema, unspecified: Secondary | ICD-10-CM | POA: Diagnosis not present

## 2017-08-20 DIAGNOSIS — Y95 Nosocomial condition: Secondary | ICD-10-CM | POA: Diagnosis not present

## 2017-08-20 DIAGNOSIS — C8330 Diffuse large B-cell lymphoma, unspecified site: Secondary | ICD-10-CM

## 2017-08-20 DIAGNOSIS — N135 Crossing vessel and stricture of ureter without hydronephrosis: Secondary | ICD-10-CM

## 2017-08-20 DIAGNOSIS — R531 Weakness: Secondary | ICD-10-CM | POA: Diagnosis not present

## 2017-08-20 DIAGNOSIS — D638 Anemia in other chronic diseases classified elsewhere: Secondary | ICD-10-CM | POA: Diagnosis present

## 2017-08-20 DIAGNOSIS — J90 Pleural effusion, not elsewhere classified: Secondary | ICD-10-CM | POA: Diagnosis not present

## 2017-08-20 DIAGNOSIS — R0600 Dyspnea, unspecified: Secondary | ICD-10-CM | POA: Diagnosis not present

## 2017-08-20 DIAGNOSIS — N390 Urinary tract infection, site not specified: Secondary | ICD-10-CM | POA: Diagnosis present

## 2017-08-20 DIAGNOSIS — R5381 Other malaise: Secondary | ICD-10-CM | POA: Diagnosis not present

## 2017-08-20 DIAGNOSIS — E785 Hyperlipidemia, unspecified: Secondary | ICD-10-CM | POA: Diagnosis not present

## 2017-08-20 DIAGNOSIS — C8590 Non-Hodgkin lymphoma, unspecified, unspecified site: Secondary | ICD-10-CM

## 2017-08-20 DIAGNOSIS — D696 Thrombocytopenia, unspecified: Secondary | ICD-10-CM | POA: Diagnosis present

## 2017-08-20 DIAGNOSIS — Z87891 Personal history of nicotine dependence: Secondary | ICD-10-CM | POA: Diagnosis not present

## 2017-08-20 DIAGNOSIS — J181 Lobar pneumonia, unspecified organism: Secondary | ICD-10-CM | POA: Diagnosis not present

## 2017-08-20 DIAGNOSIS — D539 Nutritional anemia, unspecified: Secondary | ICD-10-CM | POA: Diagnosis present

## 2017-08-20 DIAGNOSIS — C8250 Diffuse follicle center lymphoma, unspecified site: Secondary | ICD-10-CM | POA: Diagnosis not present

## 2017-08-20 DIAGNOSIS — I10 Essential (primary) hypertension: Secondary | ICD-10-CM | POA: Diagnosis not present

## 2017-08-20 DIAGNOSIS — R590 Localized enlarged lymph nodes: Secondary | ICD-10-CM | POA: Diagnosis not present

## 2017-08-20 DIAGNOSIS — D649 Anemia, unspecified: Secondary | ICD-10-CM | POA: Diagnosis present

## 2017-08-20 DIAGNOSIS — Z79899 Other long term (current) drug therapy: Secondary | ICD-10-CM | POA: Diagnosis not present

## 2017-08-20 DIAGNOSIS — R0602 Shortness of breath: Secondary | ICD-10-CM | POA: Diagnosis not present

## 2017-08-20 DIAGNOSIS — F449 Dissociative and conversion disorder, unspecified: Secondary | ICD-10-CM | POA: Diagnosis not present

## 2017-08-20 DIAGNOSIS — R600 Localized edema: Secondary | ICD-10-CM | POA: Diagnosis not present

## 2017-08-20 DIAGNOSIS — E872 Acidosis: Secondary | ICD-10-CM | POA: Diagnosis present

## 2017-08-20 DIAGNOSIS — N1330 Unspecified hydronephrosis: Secondary | ICD-10-CM | POA: Diagnosis not present

## 2017-08-20 DIAGNOSIS — D6481 Anemia due to antineoplastic chemotherapy: Secondary | ICD-10-CM | POA: Diagnosis not present

## 2017-08-20 DIAGNOSIS — R634 Abnormal weight loss: Secondary | ICD-10-CM | POA: Diagnosis not present

## 2017-08-20 DIAGNOSIS — B9689 Other specified bacterial agents as the cause of diseases classified elsewhere: Secondary | ICD-10-CM | POA: Diagnosis not present

## 2017-08-20 DIAGNOSIS — N329 Bladder disorder, unspecified: Secondary | ICD-10-CM | POA: Diagnosis present

## 2017-08-20 DIAGNOSIS — D72829 Elevated white blood cell count, unspecified: Secondary | ICD-10-CM | POA: Diagnosis not present

## 2017-08-20 DIAGNOSIS — E860 Dehydration: Secondary | ICD-10-CM | POA: Diagnosis present

## 2017-08-20 DIAGNOSIS — E669 Obesity, unspecified: Secondary | ICD-10-CM | POA: Diagnosis present

## 2017-08-20 DIAGNOSIS — R488 Other symbolic dysfunctions: Secondary | ICD-10-CM | POA: Diagnosis not present

## 2017-08-20 DIAGNOSIS — R059 Cough, unspecified: Secondary | ICD-10-CM | POA: Diagnosis present

## 2017-08-20 DIAGNOSIS — R630 Anorexia: Secondary | ICD-10-CM | POA: Diagnosis not present

## 2017-08-20 DIAGNOSIS — Z436 Encounter for attention to other artificial openings of urinary tract: Secondary | ICD-10-CM | POA: Diagnosis not present

## 2017-08-20 DIAGNOSIS — J810 Acute pulmonary edema: Secondary | ICD-10-CM | POA: Diagnosis not present

## 2017-08-20 DIAGNOSIS — D72821 Monocytosis (symptomatic): Secondary | ICD-10-CM | POA: Diagnosis not present

## 2017-08-20 DIAGNOSIS — R160 Hepatomegaly, not elsewhere classified: Secondary | ICD-10-CM | POA: Diagnosis not present

## 2017-08-20 DIAGNOSIS — D630 Anemia in neoplastic disease: Secondary | ICD-10-CM | POA: Diagnosis not present

## 2017-08-20 DIAGNOSIS — R5383 Other fatigue: Secondary | ICD-10-CM | POA: Diagnosis not present

## 2017-08-20 DIAGNOSIS — G9341 Metabolic encephalopathy: Secondary | ICD-10-CM | POA: Diagnosis present

## 2017-08-20 DIAGNOSIS — R609 Edema, unspecified: Secondary | ICD-10-CM | POA: Diagnosis not present

## 2017-08-20 DIAGNOSIS — T83092A Other mechanical complication of nephrostomy catheter, initial encounter: Secondary | ICD-10-CM | POA: Diagnosis not present

## 2017-08-20 DIAGNOSIS — R2689 Other abnormalities of gait and mobility: Secondary | ICD-10-CM | POA: Diagnosis not present

## 2017-08-20 DIAGNOSIS — E46 Unspecified protein-calorie malnutrition: Secondary | ICD-10-CM | POA: Diagnosis not present

## 2017-08-20 DIAGNOSIS — Z881 Allergy status to other antibiotic agents status: Secondary | ICD-10-CM | POA: Diagnosis not present

## 2017-08-20 DIAGNOSIS — Z936 Other artificial openings of urinary tract status: Secondary | ICD-10-CM | POA: Diagnosis not present

## 2017-08-20 DIAGNOSIS — R338 Other retention of urine: Secondary | ICD-10-CM | POA: Diagnosis not present

## 2017-08-20 DIAGNOSIS — D479 Neoplasm of uncertain behavior of lymphoid, hematopoietic and related tissue, unspecified: Secondary | ICD-10-CM | POA: Diagnosis not present

## 2017-08-20 DIAGNOSIS — N99522 Malfunction of other external stoma of urinary tract: Secondary | ICD-10-CM

## 2017-08-20 DIAGNOSIS — Z9889 Other specified postprocedural states: Secondary | ICD-10-CM | POA: Diagnosis not present

## 2017-08-20 HISTORY — DX: Hypotension, unspecified: I95.9

## 2017-08-20 HISTORY — DX: Hypercalcemia: E83.52

## 2017-08-20 HISTORY — DX: Rheumatoid arthritis, unspecified: M06.9

## 2017-08-20 HISTORY — DX: Gout, unspecified: M10.9

## 2017-08-20 HISTORY — DX: Sepsis, unspecified organism: A41.9

## 2017-08-20 HISTORY — DX: Essential (primary) hypertension: I10

## 2017-08-20 HISTORY — DX: Thrombocytopenia, unspecified: D69.6

## 2017-08-20 HISTORY — DX: Nausea with vomiting, unspecified: R11.2

## 2017-08-20 HISTORY — DX: Acute kidney failure, unspecified: N17.9

## 2017-08-20 HISTORY — DX: Hyperlipidemia, unspecified: E78.5

## 2017-08-20 LAB — I-STAT CHEM 8, ED
BUN: 31 mg/dL — AB (ref 6–20)
CALCIUM ION: 1.84 mmol/L — AB (ref 1.15–1.40)
CHLORIDE: 91 mmol/L — AB (ref 101–111)
CREATININE: 2.2 mg/dL — AB (ref 0.44–1.00)
GLUCOSE: 110 mg/dL — AB (ref 65–99)
HEMATOCRIT: 28 % — AB (ref 36.0–46.0)
Hemoglobin: 9.5 g/dL — ABNORMAL LOW (ref 12.0–15.0)
Potassium: 3.7 mmol/L (ref 3.5–5.1)
Sodium: 131 mmol/L — ABNORMAL LOW (ref 135–145)
TCO2: 33 mmol/L — AB (ref 22–32)

## 2017-08-20 LAB — HEPATIC FUNCTION PANEL
ALBUMIN: 2.8 g/dL — AB (ref 3.5–5.0)
ALT: 32 U/L (ref 14–54)
AST: 29 U/L (ref 15–41)
Alkaline Phosphatase: 90 U/L (ref 38–126)
BILIRUBIN DIRECT: 0.7 mg/dL — AB (ref 0.1–0.5)
BILIRUBIN INDIRECT: 1.3 mg/dL — AB (ref 0.3–0.9)
Total Bilirubin: 2 mg/dL — ABNORMAL HIGH (ref 0.3–1.2)
Total Protein: 5.2 g/dL — ABNORMAL LOW (ref 6.5–8.1)

## 2017-08-20 LAB — CBC
HCT: 31.8 % — ABNORMAL LOW (ref 36.0–46.0)
Hemoglobin: 10.3 g/dL — ABNORMAL LOW (ref 12.0–15.0)
MCH: 32.9 pg (ref 26.0–34.0)
MCHC: 32.4 g/dL (ref 30.0–36.0)
MCV: 101.6 fL — ABNORMAL HIGH (ref 78.0–100.0)
PLATELETS: 123 10*3/uL — AB (ref 150–400)
RBC: 3.13 MIL/uL — ABNORMAL LOW (ref 3.87–5.11)
RDW: 17 % — AB (ref 11.5–15.5)
WBC: 19 10*3/uL — AB (ref 4.0–10.5)

## 2017-08-20 LAB — URINALYSIS, ROUTINE W REFLEX MICROSCOPIC
BILIRUBIN URINE: NEGATIVE
Glucose, UA: NEGATIVE mg/dL
HGB URINE DIPSTICK: NEGATIVE
KETONES UR: NEGATIVE mg/dL
Leukocytes, UA: NEGATIVE
Nitrite: NEGATIVE
PH: 5 (ref 5.0–8.0)
Protein, ur: NEGATIVE mg/dL
SPECIFIC GRAVITY, URINE: 1.013 (ref 1.005–1.030)

## 2017-08-20 LAB — I-STAT CG4 LACTIC ACID, ED
LACTIC ACID, VENOUS: 2.7 mmol/L — AB (ref 0.5–1.9)
Lactic Acid, Venous: 2.32 mmol/L (ref 0.5–1.9)

## 2017-08-20 LAB — BASIC METABOLIC PANEL
Anion gap: 11 (ref 5–15)
BUN: 27 mg/dL — AB (ref 6–20)
CALCIUM: 13.7 mg/dL — AB (ref 8.9–10.3)
CO2: 29 mmol/L (ref 22–32)
Chloride: 92 mmol/L — ABNORMAL LOW (ref 101–111)
Creatinine, Ser: 2.08 mg/dL — ABNORMAL HIGH (ref 0.44–1.00)
GFR calc Af Amer: 26 mL/min — ABNORMAL LOW (ref >60)
GFR, EST NON AFRICAN AMERICAN: 23 mL/min — AB (ref >60)
GLUCOSE: 113 mg/dL — AB (ref 65–99)
Potassium: 3.9 mmol/L (ref 3.5–5.1)
SODIUM: 132 mmol/L — AB (ref 135–145)

## 2017-08-20 LAB — LACTIC ACID, PLASMA: Lactic Acid, Venous: 2.4 mmol/L (ref 0.5–1.9)

## 2017-08-20 LAB — PROTIME-INR
INR: 1.16
Prothrombin Time: 14.7 seconds (ref 11.4–15.2)

## 2017-08-20 LAB — SAVE SMEAR

## 2017-08-20 LAB — LACTATE DEHYDROGENASE: LDH: 562 U/L — ABNORMAL HIGH (ref 98–192)

## 2017-08-20 LAB — PROCALCITONIN: Procalcitonin: 1.2 ng/mL

## 2017-08-20 LAB — APTT: aPTT: 24 seconds (ref 24–36)

## 2017-08-20 LAB — LIPASE, BLOOD: Lipase: 22 U/L (ref 11–51)

## 2017-08-20 MED ORDER — ONDANSETRON HCL 4 MG/2ML IJ SOLN
4.0000 mg | Freq: Three times a day (TID) | INTRAMUSCULAR | Status: DC | PRN
  Administered 2017-08-21 – 2017-08-24 (×2): 4 mg via INTRAVENOUS
  Filled 2017-08-20 (×2): qty 2

## 2017-08-20 MED ORDER — CEFTRIAXONE SODIUM 2 G IJ SOLR
2.0000 g | Freq: Once | INTRAMUSCULAR | Status: AC
  Administered 2017-08-20: 2 g via INTRAVENOUS
  Filled 2017-08-20 (×2): qty 2

## 2017-08-20 MED ORDER — DM-GUAIFENESIN ER 30-600 MG PO TB12
1.0000 | ORAL_TABLET | Freq: Two times a day (BID) | ORAL | Status: DC
  Administered 2017-08-21 – 2017-09-08 (×23): 1 via ORAL
  Filled 2017-08-20 (×30): qty 1

## 2017-08-20 MED ORDER — METHOTREXATE 2.5 MG PO TABS
2.5000 mg | ORAL_TABLET | Freq: Every day | ORAL | Status: DC
  Administered 2017-08-21: 2.5 mg via ORAL
  Filled 2017-08-20: qty 1

## 2017-08-20 MED ORDER — ZOLPIDEM TARTRATE 5 MG PO TABS: 5.0000 mg | ORAL_TABLET | Freq: Every evening | ORAL | Status: DC | PRN

## 2017-08-20 MED ORDER — ACETAMINOPHEN 325 MG PO TABS
650.0000 mg | ORAL_TABLET | Freq: Four times a day (QID) | ORAL | Status: DC | PRN
  Administered 2017-08-21 – 2017-09-04 (×7): 650 mg via ORAL
  Filled 2017-08-20 (×8): qty 2

## 2017-08-20 MED ORDER — CALCITONIN (SALMON) 200 UNIT/ML IJ SOLN
400.0000 [IU] | Freq: Once | INTRAMUSCULAR | Status: DC
  Filled 2017-08-20: qty 2

## 2017-08-20 MED ORDER — SODIUM CHLORIDE 0.9 % IV BOLUS (SEPSIS)
1000.0000 mL | Freq: Once | INTRAVENOUS | Status: AC
  Administered 2017-08-20: 1000 mL via INTRAVENOUS

## 2017-08-20 MED ORDER — DEXTROSE 5 % IV SOLN
1.0000 g | INTRAVENOUS | Status: DC
  Administered 2017-08-21: 1 g via INTRAVENOUS
  Filled 2017-08-20 (×2): qty 10

## 2017-08-20 MED ORDER — SIMVASTATIN 20 MG PO TABS
20.0000 mg | ORAL_TABLET | Freq: Every day | ORAL | Status: DC
  Administered 2017-08-21 – 2017-09-08 (×19): 20 mg via ORAL
  Filled 2017-08-20 (×19): qty 1

## 2017-08-20 MED ORDER — HYDRALAZINE HCL 20 MG/ML IJ SOLN: 5.0000 mg | INTRAMUSCULAR | Status: DC | PRN

## 2017-08-20 MED ORDER — SODIUM CHLORIDE 0.9 % IV SOLN
INTRAVENOUS | Status: DC
  Administered 2017-08-20: 21:00:00 via INTRAVENOUS

## 2017-08-20 MED ORDER — ACETAMINOPHEN 650 MG RE SUPP: 650.0000 mg | Freq: Four times a day (QID) | RECTAL | Status: DC | PRN

## 2017-08-20 MED ORDER — ALLOPURINOL 300 MG PO TABS
300.0000 mg | ORAL_TABLET | Freq: Every day | ORAL | Status: DC
  Administered 2017-08-21 – 2017-09-08 (×19): 300 mg via ORAL
  Filled 2017-08-20 (×19): qty 1

## 2017-08-20 MED ORDER — IOPAMIDOL (ISOVUE-300) INJECTION 61%
INTRAVENOUS | Status: AC
  Filled 2017-08-20: qty 30

## 2017-08-20 MED ORDER — SODIUM CHLORIDE 0.9 % IV SOLN
INTRAVENOUS | Status: DC
  Administered 2017-08-21 – 2017-08-22 (×2): via INTRAVENOUS

## 2017-08-20 MED ORDER — SODIUM CHLORIDE 0.9 % IV BOLUS (SEPSIS)
1500.0000 mL | Freq: Once | INTRAVENOUS | Status: AC
  Administered 2017-08-20: 1500 mL via INTRAVENOUS

## 2017-08-20 NOTE — Progress Notes (Signed)
Pharmacy Antibiotic Note  Kristina Torres is a 71 y.o. female admitted on 08/20/2017.  Pharmacy has been consulted for Ceftriaxone dosing for UTI.  Ceftriaxone does not require dose adjustment for renal impairment. Ceftriaxone 2g x1 ordered to be given in ED now.  Plan: Ceftriaxone 1g IV q24h  Pharmacy will sign off  Height: 4' 10.5" (148.6 cm) Weight: 175 lb (79.4 kg) IBW/kg (Calculated) : 42.05  Temp (24hrs), Avg:98.5 F (36.9 C), Min:98.5 F (36.9 C), Max:98.5 F (36.9 C)   Recent Labs Lab 08/20/17 1754 08/20/17 1933 08/20/17 1941  WBC 19.0*  --   --   CREATININE 2.08* 2.20*  --   LATICACIDVEN  --   --  2.70*    Estimated Creatinine Clearance: 21.1 mL/min (A) (by C-G formula based on SCr of 2.2 mg/dL (H)).    Allergies  Allergen Reactions  . Streptomycin Anaphylaxis     Thank you for allowing pharmacy to be a part of this patient's care. Nicole Cella, RPh Clinical Pharmacist Pager: [phone] 08/20/2017 7:53 PM

## 2017-08-20 NOTE — H&P (Addendum)
History and Physical    Jamieson Lisa ZOX:[id number] DOB: [birth year] DOA: 08/20/2017  Referring MD/NP/PA:   PCP: [name], MD   Patient coming from:  The patient is coming from home.  At baseline, pt is independent for most of ADL.       Chief Complaint: Nausea, vomiting, lightheadedness, headache, cough  HPI: Kristina Torres is a 71 y.o. female with medical history significant of hypertension, hyperlipidemia, gout, rheumatoid arthritis, renal mass, who presents with nausea, vomiting, lightheadedness, headache and cough.   Pt states that she has not been feeling good recently. She has been having nausea and vomiting for about a week. No diarrhea. She has mild abdominal pain over right upper quadrant abdomen, which is constant, dull, mild, nonradiating. She also has lightheadedness, generalized weakness. She was seen in [facility] on Tuesday and was found to have "right renal mass [place] with blockage". She was also started on ciprofloxacin for UTI. Pt states she is scheduled to see urologist, Dr. Alvan Dame on Thursday. She is also supposed to get CT scan per pt. Pt state she still has nausea, vomiting, lightheadedness and headache. She vomited 4 times today without blood in the vomitus. She denies symptoms of UTI or hematuria. She has dry cough, but denies chest pain or shortness of breath. No unilateral weakness. No fever or chills. Pt had hypotension with blood pressure 83/65, which improved to 100/55 after giving 1 L normal saline bolus in ED.  ED Course: pt was found to have WBC 19.0, platelet 123 which was 88 on 08/13/17, lactic acid 2.70, negative urinalysis, acute renal injury with creatinine 2.08 which was 0.93 on 08/13/17, negative chest x-ray, hypercalcemia with calcium 13.87, ionized calcium 1.84 tachypnea, oxygen saturation 90 on room air,--> 98% on 2 L nasal cannula oxygen, tachypnea, normal temperature. Patient is admitted to telemetry bed as inpatient.  # CT-abdomen/pelbis  showed:  1. Vague hypodense mass with central calcification in the right hepatic lobe presumably corresponding to the MRI mass from 2011which was thought to represent hemangioma. 2. Moderate retroperitoneal somewhat bulky adenopathy. Splenomegaly. Findings raise concern for metastatic disease or lymphoproliferative abnormality such as lymphoma.  3. Negative for a bowel obstruction or colon wall thickening 4. Severe right hydronephrosis and hydroureter with distal tapering of the ureter. No definitive stones are seen, findings could be secondary to a stricture 5. Calcified uterine fibroids  Review of Systems:   General: no fevers, chills, no body weight gain, has poor appetite, has fatigue HEENT: no blurry vision, hearing changes or sore throat Respiratory: no dyspnea, has coughing, no wheezing CV: no chest pain, no palpitations GI: has nausea, vomiting, abdominal pain, no diarrhea, constipation GU: no dysuria, burning on urination, increased urinary frequency, hematuria  Ext: no leg edema. Has deformity in fingers Neuro: no unilateral weakness, numbness, or tingling, no vision change or hearing loss Skin: no rash, no skin tear. MSK: No muscle spasm, no deformity, no limitation of range of movement in spin Heme: No easy bruising.  Travel history: No recent long distant travel.  Allergy:  Allergies  Allergen Reactions  . Streptomycin Anaphylaxis    Past Medical History:  Diagnosis Date  . Essential hypertension   . Gout   . HLD (hyperlipidemia)   . Rheumatoid arthritis [facility])     Past Surgical History:  Procedure Laterality Date  . APPENDECTOMY    . CARPAL TUNNEL RELEASE Right   . LAPAROSCOPIC SALPINGO OOPHERECTOMY Right     Social History:  reports that she quit smoking  about 2 weeks ago. She has never used smokeless tobacco. She reports that she does not drink alcohol or use drugs.  Family History:  Family History  Problem Relation Age of Onset  . CVA Mother       Prior to Admission medications   Medication Sig Start Date End Date Taking? Authorizing Provider  allopurinol (ZYLOPRIM) 300 MG tablet Take 300 mg by mouth daily. 05/19/17  Yes [provider]  Calcium Carb-Cholecalciferol (CALCIUM 1000 + D PO) Take 1 tablet by mouth daily.   Yes [provider]  ciprofloxacin (CIPRO) 250 MG tablet Take 250 mg by mouth 2 (two) times daily. 08/15/17  Yes [provider]  hydrochlorothiazide (MICROZIDE) 12.5 MG capsule Take 12.5 mg by mouth daily.  07/19/17  Yes [provider]  methotrexate (RHEUMATREX) 2.5 MG tablet Take 2.5 mg by mouth daily. 05/20/17  Yes [provider]  ondansetron (ZOFRAN-ODT) 4 MG disintegrating tablet Take 4 mg by mouth as needed. 08/14/17  Yes [provider]  simvastatin (ZOCOR) 20 MG tablet Take 20 mg by mouth daily. 08/17/17  Yes [provider]    Physical Exam: Vitals:   08/20/17 2130 08/20/17 2200 08/20/17 2334 08/21/17 0023  BP: (!) 102/48 (!) 115/56 115/61 (!) 119/56  Pulse: 72 75 77 71  Resp: 19 (!) 27 (!) 21 19  Temp:    97.6 F (36.4 C)  TempSrc:    Oral  SpO2: 99% 97% 93% 97%  Weight:    82.2 kg (181 lb 4.8 oz)  Height:       General: Not in acute distress. pale looking. HEENT:       Eyes: PERRL, EOMI, no scleral icterus.       ENT: No discharge from the ears and nose, no pharynx injection, no tonsillar enlargement.        Neck: No JVD, no bruit, no mass felt. Heme: No neck lymph node enlargement. Cardiac: S1/S2, RRR, No murmurs, No gallops or rubs. Respiratory: No rales, wheezing, rhonchi or rubs. GI: Soft, nondistended, has mild tenderness in RQU, no rebound pain, no organomegaly, BS present. GU: No hematuria Ext: No pitting leg edema bilaterally. 2+DP/PT pulse bilaterally. Has deformity in fingers Musculoskeletal: No joint deformities, No joint redness or warmth, no limitation of ROM in spin. Skin: No rashes.  Neuro: Alert, oriented X3, cranial  nerves II-XII grossly intact, moves all extremities normally. Psych: Patient is not psychotic, no suicidal or hemocidal ideation.  Labs on Admission: I have personally reviewed following labs and imaging studies  CBC:  Recent Labs Lab 08/20/17 1754 08/20/17 1933 08/21/17 0036 08/21/17 0233  WBC 19.0*  --  15.1* 15.9*  NEUTROABS  --   --  7.3  --   HGB 10.3* 9.5* 8.0* 9.1*  HCT 31.8* 28.0* 24.2* 27.9*  MCV 101.6*  --  100.8* 100.7*  PLT 123*  --  100* 149*   Basic Metabolic Panel:  Recent Labs Lab 08/20/17 1754 08/20/17 1933 08/21/17 0036  NA 132* 131* 132*  133*  K 3.9 3.7 3.5  3.5  CL 92* 91* 96*  96*  CO2 29  --  27  29  GLUCOSE 113* 110* 117*  122*  BUN 27* 31* 28*  28*  CREATININE 2.08* 2.20* 1.75*  1.74*  CALCIUM 13.7*  --  12.2*  12.3*   GFR: Estimated Creatinine Clearance: 27.2 mL/min (A) (by C-G formula based on SCr of 1.74 mg/dL (H)). Liver Function Tests:  Recent Labs Lab 08/20/17 1931  AST 29  ALT 32  ALKPHOS 90  BILITOT 2.0*  PROT 5.2*  ALBUMIN 2.8*    Recent Labs Lab 08/20/17 2255  LIPASE 22   No results for input(s): AMMONIA in the last 168 hours. Coagulation Profile:  Recent Labs Lab 08/20/17 2255  INR 1.16   Cardiac Enzymes: No results for input(s): CKTOTAL, CKMB, CKMBINDEX, TROPONINI in the last 168 hours. BNP (last 3 results) No results for input(s): PROBNP in the last 8760 hours. HbA1C: No results for input(s): HGBA1C in the last 72 hours. CBG: No results for input(s): GLUCAP in the last 168 hours. Lipid Profile: No results for input(s): CHOL, HDL, LDLCALC, TRIG, CHOLHDL, LDLDIRECT in the last 72 hours. Thyroid Function Tests: No results for input(s): TSH, T4TOTAL, FREET4, T3FREE, THYROIDAB in the last 72 hours. Anemia Panel:  Recent Labs  08/21/17 0036  VITAMINB12 767  FOLATE 9.7  FERRITIN 745*  TIBC 214*  IRON 29  RETICCTPCT 3.9*   Urine analysis:    Component Value Date/Time   COLORURINE YELLOW  08/20/2017 2115   APPEARANCEUR HAZY (A) 08/20/2017 2115   LABSPEC 1.013 08/20/2017 2115   PHURINE 5.0 08/20/2017 2115   GLUCOSEU NEGATIVE 08/20/2017 2115   HGBUR NEGATIVE 08/20/2017 2115   BILIRUBINUR NEGATIVE 08/20/2017 2115   KETONESUR NEGATIVE 08/20/2017 2115   PROTEINUR NEGATIVE 08/20/2017 2115   NITRITE NEGATIVE 08/20/2017 2115   LEUKOCYTESUR NEGATIVE 08/20/2017 2115   Sepsis Labs: @LABRCNTIP (procalcitonin:4,lacticidven:4) )No results found for this or any previous visit (from the past 240 hour(s)).   Radiological Exams on Admission: Ct Abdomen Pelvis Wo Contrast  Result Date: 08/20/2017 CLINICAL DATA:  Abdominal pain EXAM: CT ABDOMEN AND PELVIS WITHOUT CONTRAST TECHNIQUE: Multidetector CT imaging of the abdomen and pelvis was performed following the standard protocol without IV contrast. COMPARISON:  MRI 02/10/2010 FINDINGS: Lower chest: Lung bases demonstrate no acute consolidation or pleural effusion. The heart size is within normal limits. Hepatobiliary: Vague hypodense mass with central calcification in the right hepatic lobe measuring at least 8.5 cm and presumably corresponding to the 2011 demonstrated MRI mass/ thought than to represent hemangioma. No calcified gallstones. No biliary dilatation Pancreas: Unremarkable. No pancreatic ductal dilatation or surrounding inflammatory changes. Spleen: Enlarged, measuring 14 cm Adrenals/Urinary Tract: Left adrenal gland is within normal limits. Possible cyst in the mid to lower pole left kidney. Severe hydronephrosis and hydroureter of the right kidney with distal tapering of the ureter. Bladder within normal limits. No stones are seen Stomach/Bowel: The stomach is nonenlarged. There is no dilated small bowel. No colon wall thickening. Vascular/Lymphatic: Aortic atherosclerosis. Multiple enlarged periaortic/retroperitoneal lymph nodes, measuring up to 2 cm. Right greater than left iliac adenopathy. Reproductive: Calcified uterine fibroids.  No  adnexal masses. Other: Negative for free air or free fluid. Musculoskeletal: Degenerative changes. No acute or suspicious bone lesion IMPRESSION: 1. Vague hypodense mass with central calcification in the right hepatic lobe presumably corresponding to the MRI mass from 2011 which was thought to represent hemangioma 2. Moderate retroperitoneal somewhat bulky adenopathy. Splenomegaly. Findings raise concern for metastatic disease or lymphoproliferative abnormality such as lymphoma. Clinical correlation is recommended 3. Negative for a bowel obstruction or colon wall thickening 4. Severe right hydronephrosis and hydroureter with distal tapering of the ureter. No definitive stones are seen, findings could be secondary to a stricture 5. Calcified uterine fibroids Electronically Signed   By: Donavan Foil M.D.   On: 08/20/2017 23:55   Dg Chest 2 View  Result Date: 08/20/2017 CLINICAL DATA:  Weakness nausea and  vomiting EXAM: CHEST  2 VIEW COMPARISON:  None. FINDINGS: The heart size and mediastinal contours are within normal limits. Both lungs are clear. Degenerative changes of the spine. IMPRESSION: No active cardiopulmonary disease. Electronically Signed   By: Donavan Foil M.D.   On: 08/20/2017 19:42     EKG: Independently reviewed.  Sinus rhythm, QTC 430, early R-wave progression, low voltage    Assessment/Plan Principal Problem:   Nausea & vomiting Active Problems:   Rheumatoid arthritis (HCC)   Essential hypertension   Gout   UTI (urinary tract infection)   Sepsis (HCC)   HLD (hyperlipidemia)   Hypotension   AKI (acute kidney injury) ([place])   Hypercalcemia   Thrombocytopenia (HCC)   Cough   Macrocytic anemia   Nausea & vomiting: Etiology is not clear. May be related to liverl mass and possible UTI. Pending CT-abdomen/pelvis. -Will admit to tele bed as inpt -prn Zofran for nausea and morphine for pain -check lipase -f/u CT-abdomen/pelvis which was ordered by EDP physician. -treat  possible UTI as below -get CMP in AM for LFT  Possible UTI: pt has been treated for UTI with ciprofloxacin. in the past 4 days. Patient denies symptoms for UTI. Urinalysis is negative, which may be due to that patient isn't taking antibiotics. Since patient has leukocytosis, hypotension and possible sepsis, also pt is immunosuppressed due to methotrexate use, will continue Abx for now. -will switch cipro to IV Rocephin -f/u Bx and Ux  Hypotension and possible sepsis: Patient is hypotensive initially, which responded to IV fluids. Currently blood pressure is 100/55. Hemodynamically stable. Hypotension is partially from dehydration secondary to nausea and vomiting, but cannot rule out sepsis since patient has leukocytosis and tachypnea. Lactic acid is also elevated. -will get Procalcitonin and trend lactic acid levels per sepsis protocol. -IVF: 2.5L of NS bolus in ED, followed by 125 cc/h  -IV Rocephin as above  Hypercalcemia: Patient is taking calcium and vitamin D supplement. Also taking HCTZ which can partially explain hypercalcemia. Dehydration may also contributed partially. Patient may also malignancy as indicated by CT abdomen/pelvis. Currently patient's mental status is normal, oriented 3. -IVF as above -give one does of Calcitonin 4 units/kg -hold  calcium and vitamin D supplement -check PTH related peptide, vitamin D. 25, and vitamin D 1-25.  -repeat BMP at 2:00 AM  Dry cough: Etiology is not clear, chest x-ray negative. Likely due to upper respiratory viral infection vs. mild bronchitis. Patient denies shortness of breath and chest pain. -When necessary Mucinex for cough -Patient is on Rocephin which is also possible UTI, should cover mild bronchitis.  Essential hypertension: -hold HCTZ due to hypotension  Gout: -continue home allopurinol  HLD: -zocor  AKI and hydronephrosis and hydroureter: Acute renal injury with creatinine 2.08 which was 0.93 on 08/13/17. Likely due to  multifactorial etiology, including prerenal secondary to dehydration, hypercalcemia, and continuation of diuretics. CT scan showed severe right hydronephrosis and hydroureter with distal tapering of the ureter. No definitive stones are seen, findings could be secondary to a stricture - IVF as above - Check FeUrea - Follow up renal function by BMP - Hold HCTZ - Urology, Dr. Doroteo Bradford was consulted.   Thrombocytopenia ([place]):  platelet 123 which was 88 on 08/13/17. No bleeding tendency. Mental status is normal. Etiology is not clear. -f/u by CBC -check LDH and peripheral smear  Liver mass: She was seen in [facility] on Tuesday and was found to have "right renal mass [place] with blockage" per pt's report. CT-scan today  showed a vague hypodense mass with central calcification in the right hepatic lobe presumably corresponding to the MRI mass from 2011which was thought to represent hemangioma; and moderate retroperitoneal somewhat bulky adenopathy. Splenomegaly. Findings raise concern for metastatic disease or lymphoproliferative abnormality such as lymphoma.  -May need to consult to oncology in AM.  Rheumatoid arthritis: Stable -Continue methotrexate  Macrocytic anemia: hgb 10.0 and MCV 101.6 -will check LDH and peripheral smear  DVT ppx: SCD Code Status: Full code Family Communication: Yes, patient's husband at bed side Disposition Plan:  Anticipate discharge back to previous home environment Consults called: Urology, Dr. Doroteo Bradford was consulted. Admission status:   Inpatient/tele  Date of Service 08/21/2017    Ivor Costa [facility] Pager [phone]  If 7PM-7AM, please contact night-coverage [email or web address] Password [email or web address] 08/21/2017, 4:19 AM

## 2017-08-20 NOTE — ED Notes (Signed)
Pa aware of pts critical calcium level

## 2017-08-20 NOTE — ED Provider Notes (Signed)
[facility] DEPT Provider Note   CSN: [id number] Arrival date & time: 08/20/17  1727     History   Chief Complaint Chief Complaint  Patient presents with  . Abnormal Lab    HPI Kristina Torres is a 71 y.o. female.  HPI   Patient is a 71 year old female with a past history of rheumatoid arthritis and gout presenting for approximately 3 weeks of progressive shortness of breath and generalized weakness. Patient had a mechanical fall in the bathroom this morning, without trauma, where she was reaching for something and then had to slowly lower herself to the floor due to weakness and dizziness. Patient reports that this began with a "sinus infection with significant rhinorrhea and postnasal drip that has progressed to significant shortness of breath with exertion. Patient reports that the shortness of breath feels like there is a pressure from her upper abdomen. Patient reports that today she began feeling a pain or pressure in bilateral flanks. Patient reports she has had a headache intermittently since this sinus infection, and has been dizzy particularly with bending over and standing up, and feels that the weakness is primarily in her legs. Additionally patient reports that she has had no appetite for the past 4 days and has lost 5 pounds in one week. Patient has been checking blood pressure and notes that it is significantly lower than her baseline. Patient reports a chronic cough but is nonproductive of blood or sputum, patient denies chest pain, abdominal pain, nausea, vomiting, diarrhea, dysuria, lower extremity edema. Patient visited her primary care physician on August 25 with these complaints, and then again on August 27 with repeat lab work.   per her PCP several for "numbers" had changed prompting additional evaluation. Patient underwent chest x-ray and right upper quadrant ultrasound at [facility], however these records are not available. Patient reports that the right  upper quadrant ultrasound noted a right renal mass. Patient traveled to [facility] during the time of this sinus infection, and visited the [facility] over the past several days.   Past Medical History:  Diagnosis Date  . Gout   . Rheumatoid arthritis ([place])     There are no active problems to display for this patient.   Past Surgical History:  Procedure Laterality Date  . APPENDECTOMY    . CARPAL TUNNEL RELEASE Right   . LAPAROSCOPIC SALPINGO OOPHERECTOMY Right     OB History    No data available       Home Medications    Prior to Admission medications   Medication Sig Start Date End Date Taking? Authorizing Provider  allopurinol (ZYLOPRIM) 300 MG tablet Take 300 mg by mouth daily. 05/19/17  Yes [provider]  Calcium Carb-Cholecalciferol (CALCIUM 1000 + D PO) Take 1 tablet by mouth daily.   Yes [provider]  ciprofloxacin (CIPRO) 250 MG tablet Take 250 mg by mouth 2 (two) times daily. 08/15/17  Yes [provider]  hydrochlorothiazide (MICROZIDE) 12.5 MG capsule Take 12.5 mg by mouth daily.  07/19/17  Yes [provider]  methotrexate (RHEUMATREX) 2.5 MG tablet Take 2.5 mg by mouth daily. 05/20/17  Yes [provider]  ondansetron (ZOFRAN-ODT) 4 MG disintegrating tablet Take 4 mg by mouth as needed. 08/14/17  Yes [provider]  simvastatin (ZOCOR) 20 MG tablet Take 20 mg by mouth daily. 08/17/17  Yes [provider]    Family History No family history on file.  Social History Social History  Substance  Use Topics  . Smoking status: Former Smoker    Quit date: 08/06/2017  . Smokeless tobacco: Never Used  . Alcohol use No     Allergies   Streptomycin   Review of Systems Review of Systems  Constitutional: Positive for activity change, appetite change and unexpected weight change. Negative for chills and fever.  HENT: Positive for congestion and rhinorrhea.   Eyes: Negative  for visual disturbance.  Respiratory: Positive for cough and shortness of breath. Negative for chest tightness.   Cardiovascular: Positive for palpitations. Negative for chest pain and leg swelling.  Gastrointestinal: Positive for constipation. Negative for abdominal pain, nausea and vomiting.  Genitourinary: Positive for flank pain. Negative for dysuria.  Musculoskeletal: Positive for back pain. Negative for myalgias.  Skin: Negative for rash.  Neurological: Positive for dizziness, light-headedness and headaches. Negative for syncope.     Physical Exam Updated Vital Signs BP (!) 94/56   Pulse 83   Temp 98.5 F (36.9 C) (Oral)   Resp 18   Ht 4' 10.5" (1.486 m)   Wt 79.4 kg (175 lb)   SpO2 93%   BMI 35.95 kg/m   Physical Exam  Constitutional: She appears well-developed and well-nourished. No distress.  Patient appears fatigued  HENT:  Head: Normocephalic and atraumatic.  Mild dryness to mucous members.  Eyes: Pupils are equal, round, and reactive to light. Conjunctivae and EOM are normal.  Neck: Normal range of motion. Neck supple.  Cardiovascular: Normal rate, regular rhythm, S1 normal and S2 normal.   No murmur heard. Pulmonary/Chest: Effort normal and breath sounds normal. She has no wheezes. She has no rales.  Abdominal: Soft. She exhibits no distension. There is no tenderness. There is no guarding.  Musculoskeletal: Normal range of motion. She exhibits no edema or deformity.  Lymphadenopathy:    She has no cervical adenopathy.  Neurological: She is alert.  Cranial nerves grossly intact. Normal coordination and movement in upper/lower extremities.   Skin: Skin is warm and dry. No rash noted. No erythema.  Skin appears jaundiced.  Psychiatric: She has a normal mood and affect. Her behavior is normal. Judgment and thought content normal.  Nursing note and vitals reviewed.    ED Treatments / Results  Labs (all labs ordered are listed, but only abnormal results are  displayed) Labs Reviewed  BASIC METABOLIC PANEL - Abnormal; Notable for the following:       Result Value   Sodium 132 (*)    Chloride 92 (*)    Glucose, Bld 113 (*)    BUN 27 (*)    Creatinine, Ser 2.08 (*)    Calcium 13.7 (*)    GFR calc non Af Amer 23 (*)    GFR calc Af Amer 26 (*)    All other components within normal limits  CBC - Abnormal; Notable for the following:    WBC 19.0 (*)    RBC 3.13 (*)    Hemoglobin 10.3 (*)    HCT 31.8 (*)    MCV 101.6 (*)    RDW 17.0 (*)    Platelets 123 (*)    All other components within normal limits  URINALYSIS, ROUTINE W REFLEX MICROSCOPIC  HEPATIC FUNCTION PANEL  CALCIUM, IONIZED  PARATHYROID HORMONE, INTACT (NO CA)  CBG MONITORING, ED  I-STAT CHEM 8, ED    EKG  EKG Interpretation  Date/Time:  Monday August 20 2017 17:46:10 EDT Ventricular Rate:  102 PR Interval:  150 QRS Duration: 82 QT Interval:  330 QTC  Calculation: 430 R Axis:   59 Text Interpretation:  Sinus tachycardia Otherwise normal ECG No old tracing to compare Confirmed by Noemi Chapel [phone]) on 08/20/2017 6:04:27 PM       Radiology No results found.  Procedures Procedures (including critical care time)  Medications Ordered in ED Medications  sodium chloride 0.9 % bolus 1,000 mL (not administered)     Initial Impression / Assessment and Plan / ED Course  I have reviewed the triage vital signs and the nursing notes.  Pertinent labs & imaging results that were available during my care of the patient were reviewed by me and considered in my medical decision making (see chart for details).  Clinical Course as of Aug 21 8  Mon Aug 20, 2017  2016 Sepsis workup for patient discussed with Dr. Noemi Chapel. Additional 200 mL/hour NS maintenance fluid discussed and ordered.  [AM]  2029 Chest x-ray reviewed by me. No acute cardiopulmonary processes noted.  [AM]  2108 Patient informed of her test results. Patient accompanied by her husband. Questions  answered.  [AM]    Clinical Course User Index [AM] Albesa Seen, PA-C    Final Clinical Impressions(s) / ED Diagnoses   Final diagnoses:  None   MDM  Patient is a 71 year old female with a past history of rheumatoid arthritis and gout presenting for approximately 3 weeks of progressive shortness of breath and generalized weakness in the setting of an outpatient workup for renal mass. Patient ultimately found to have marked hypercalcemia (13.7 unionized 1.84 ionized calcium), AKI (SCr 2.08), and SIRS criteria without identified source of infection. Lactic acidosis demonstrated at 2.32. Urinalysis negative for any signs of infection. Patient's hypotension responded to initial fluid bolus. Patient was mentating well and otherwise non-toxic appearing during ED course. Patient was treated empirically with ceftriaxone and admitted to the hospital.  This is a shared visit with Dr. Noemi Chapel. Patient was independently evaluated by this attending physician. Attending physician consulted in evaluation and admission.  New Prescriptions New Prescriptions   No medications on file     Tamala Julian 08/21/17 Pasty Arch, MD 08/21/17 [phone]

## 2017-08-20 NOTE — ED Provider Notes (Signed)
Medical screening examination/treatment/procedure(s) were conducted as a shared visit with non-physician practitioner(s) and myself.  I personally evaluated the patient during the encounter.  Clinical Impression:   Final diagnoses:  AKI (acute kidney injury) ([place])  Hypercalcemia  Sepsis, due to unspecified organism [facility])    The patient is a 71 year old female, she has a complicated recent medical history, found to have an ultrasound confirmed renal cell mass of unknown etiology which is currently being worked up and is supposed to see urology in the near future for CT scan and further evaluation. Over the last week the patient has become more orthostatic, becoming very lightheaded when she stands and also becoming generally weak. On exam the patient has no tachycardia, soft abdomen, appears generally weak, dry and the mucous membranes, normal strength in all 4 extremities.  The patient is slightly hypotensive, she has an elevated white blood cell count, her creatinine is 2.0 and her calcium is over 13. The patient is significantly ill, she will need IV fluid resuscitation and likely antibiotics as a septic workup will need to be initiated.    D/w Dr. Blaine Hamper - will admit  The patient has multiple abnormal labs including an acute kidney injury with of severe leukocytosis and hypotension with an elevated lactic acid. The patient will be treated as if she is septic, the source is not clear, CT scan is pending at the time of admission, these findings and plan were communicated with the admitting hospitalist, Dr. Blaine Hamper who will follow-up`.  CRITICAL CARE Performed by: Johnna Acosta Total critical care time: 35 minutes Critical care time was exclusive of separately billable procedures and treating other patients. Critical care was necessary to treat or prevent imminent or life-threatening deterioration. Critical care was time spent personally by me on the following activities: development of treatment  plan with patient and/or surrogate as well as nursing, discussions with consultants, evaluation of patient's response to treatment, examination of patient, obtaining history from patient or surrogate, ordering and performing treatments and interventions, ordering and review of laboratory studies, ordering and review of radiographic studies, pulse oximetry and re-evaluation of patient's condition.      Noemi Chapel, MD 08/21/17 [phone]

## 2017-08-20 NOTE — ED Triage Notes (Signed)
Pt in for abnormal labs plateletes 88 & hemoglobin 10.2, WBV 11.3, pt c/o fatigue & cough, pt hypotensive in triage, pt c/o headache, c/o n/v/, denies diarrhea, LBM x 6 days, pt reports cyst on Liver, pt reports vomiting twice in the last 24 hrs, pt reports recent diagnosis of kidney mass, pt A&O x4

## 2017-08-20 NOTE — ED Notes (Signed)
Abnormal CG-4  & Chem-8 results reported to Dr. Sabra Heck

## 2017-08-21 ENCOUNTER — Inpatient Hospital Stay (HOSPITAL_COMMUNITY): Payer: Medicare Other

## 2017-08-21 ENCOUNTER — Encounter (HOSPITAL_COMMUNITY): Payer: Self-pay | Admitting: *Deleted

## 2017-08-21 DIAGNOSIS — R591 Generalized enlarged lymph nodes: Secondary | ICD-10-CM

## 2017-08-21 DIAGNOSIS — R112 Nausea with vomiting, unspecified: Secondary | ICD-10-CM

## 2017-08-21 DIAGNOSIS — D539 Nutritional anemia, unspecified: Secondary | ICD-10-CM

## 2017-08-21 DIAGNOSIS — N179 Acute kidney failure, unspecified: Secondary | ICD-10-CM

## 2017-08-21 DIAGNOSIS — D649 Anemia, unspecified: Secondary | ICD-10-CM | POA: Diagnosis present

## 2017-08-21 DIAGNOSIS — I10 Essential (primary) hypertension: Secondary | ICD-10-CM

## 2017-08-21 HISTORY — DX: Nutritional anemia, unspecified: D53.9

## 2017-08-21 HISTORY — PX: IR NEPHROSTOMY PLACEMENT RIGHT: IMG6064

## 2017-08-21 LAB — CBC WITH DIFFERENTIAL/PLATELET
BASOS PCT: 1 %
Basophils Absolute: 0.2 10*3/uL — ABNORMAL HIGH (ref 0.0–0.1)
EOS ABS: 0 10*3/uL (ref 0.0–0.7)
EOS PCT: 0 %
HCT: 24.2 % — ABNORMAL LOW (ref 36.0–46.0)
Hemoglobin: 8 g/dL — ABNORMAL LOW (ref 12.0–15.0)
LYMPHS PCT: 25 %
Lymphs Abs: 3.8 10*3/uL (ref 0.7–4.0)
MCH: 33.3 pg (ref 26.0–34.0)
MCHC: 33.1 g/dL (ref 30.0–36.0)
MCV: 100.8 fL — AB (ref 78.0–100.0)
MONOS PCT: 25 %
Monocytes Absolute: 3.8 10*3/uL — ABNORMAL HIGH (ref 0.1–1.0)
NEUTROS ABS: 7.3 10*3/uL (ref 1.7–7.7)
NEUTROS PCT: 49 %
PLATELETS: 100 10*3/uL — AB (ref 150–400)
RBC: 2.4 MIL/uL — ABNORMAL LOW (ref 3.87–5.11)
RDW: 16.9 % — AB (ref 11.5–15.5)
WBC: 15.1 10*3/uL — ABNORMAL HIGH (ref 4.0–10.5)

## 2017-08-21 LAB — BASIC METABOLIC PANEL
Anion gap: 8 (ref 5–15)
Anion gap: 9 (ref 5–15)
Anion gap: 12 (ref 5–15)
BUN: 28 mg/dL — ABNORMAL HIGH (ref 6–20)
BUN: 28 mg/dL — ABNORMAL HIGH (ref 6–20)
BUN: 28 mg/dL — ABNORMAL HIGH (ref 6–20)
CALCIUM: 12.8 mg/dL — AB (ref 8.9–10.3)
CHLORIDE: 100 mmol/L — AB (ref 101–111)
CHLORIDE: 96 mmol/L — AB (ref 101–111)
CHLORIDE: 96 mmol/L — AB (ref 101–111)
CO2: 27 mmol/L (ref 22–32)
CO2: 29 mmol/L (ref 22–32)
CO2: 23 mmol/L (ref 22–32)
Calcium: 12.2 mg/dL — ABNORMAL HIGH (ref 8.9–10.3)
Calcium: 12.3 mg/dL — ABNORMAL HIGH (ref 8.9–10.3)
Creatinine, Ser: 1.74 mg/dL — ABNORMAL HIGH (ref 0.44–1.00)
Creatinine, Ser: 1.75 mg/dL — ABNORMAL HIGH (ref 0.44–1.00)
Creatinine, Ser: 1.56 mg/dL — ABNORMAL HIGH (ref 0.44–1.00)
GFR calc non Af Amer: 28 mL/min — ABNORMAL LOW (ref >60)
GFR calc non Af Amer: 28 mL/min — ABNORMAL LOW (ref >60)
GFR calc non Af Amer: 32 mL/min — ABNORMAL LOW (ref >60)
GFR, EST AFRICAN AMERICAN: 37 mL/min — AB (ref >60)
GFR, EST AFRICAN AMERICAN: 33 mL/min — AB (ref >60)
GFR, EST AFRICAN AMERICAN: 33 mL/min — AB (ref >60)
Glucose, Bld: 117 mg/dL — ABNORMAL HIGH (ref 65–99)
Glucose, Bld: 122 mg/dL — ABNORMAL HIGH (ref 65–99)
Glucose, Bld: 85 mg/dL (ref 65–99)
POTASSIUM: 3.5 mmol/L (ref 3.5–5.1)
POTASSIUM: 3.5 mmol/L (ref 3.5–5.1)
Potassium: 3.3 mmol/L — ABNORMAL LOW (ref 3.5–5.1)
SODIUM: 132 mmol/L — AB (ref 135–145)
SODIUM: 133 mmol/L — AB (ref 135–145)
Sodium: 135 mmol/L (ref 135–145)

## 2017-08-21 LAB — GLUCOSE, CAPILLARY: GLUCOSE-CAPILLARY: 79 mg/dL (ref 65–99)

## 2017-08-21 LAB — CBC
HEMATOCRIT: 27.9 % — AB (ref 36.0–46.0)
HEMOGLOBIN: 9.1 g/dL — AB (ref 12.0–15.0)
MCH: 32.9 pg (ref 26.0–34.0)
MCHC: 32.6 g/dL (ref 30.0–36.0)
MCV: 100.7 fL — AB (ref 78.0–100.0)
Platelets: 111 10*3/uL — ABNORMAL LOW (ref 150–400)
RBC: 2.77 MIL/uL — AB (ref 3.87–5.11)
RDW: 17.1 % — ABNORMAL HIGH (ref 11.5–15.5)
WBC: 15.9 10*3/uL — AB (ref 4.0–10.5)

## 2017-08-21 LAB — FOLATE: FOLATE: 9.7 ng/mL (ref >5.9)

## 2017-08-21 LAB — LACTIC ACID, PLASMA
LACTIC ACID, VENOUS: 2.1 mmol/L — AB (ref 0.5–1.9)
Lactic Acid, Venous: 2.2 mmol/L (ref 0.5–1.9)

## 2017-08-21 LAB — FERRITIN: Ferritin: 745 ng/mL — ABNORMAL HIGH (ref 11–307)

## 2017-08-21 LAB — CREATININE, URINE, RANDOM: Creatinine, Urine: 133.15 mg/dL

## 2017-08-21 LAB — RETICULOCYTES
RBC.: 2.41 MIL/uL — AB (ref 3.87–5.11)
Retic Count, Absolute: 94 10*3/uL (ref 19.0–186.0)
Retic Ct Pct: 3.9 % — ABNORMAL HIGH (ref 0.4–3.1)

## 2017-08-21 LAB — IRON AND TIBC
Iron: 29 ug/dL (ref 28–170)
SATURATION RATIOS: 14 % (ref 10.4–31.8)
TIBC: 214 ug/dL — ABNORMAL LOW (ref 250–450)
UIBC: 185 ug/dL

## 2017-08-21 LAB — ALBUMIN: ALBUMIN: 2 g/dL — AB (ref 3.5–5.0)

## 2017-08-21 LAB — VITAMIN B12: VITAMIN B 12: 767 pg/mL (ref 180–914)

## 2017-08-21 MED ORDER — SODIUM CHLORIDE 0.9% FLUSH: 5.0000 mL | Freq: Three times a day (TID) | INTRAVENOUS | Status: DC

## 2017-08-21 MED ORDER — SODIUM CHLORIDE 0.9 % IV BOLUS (SEPSIS)
500.0000 mL | Freq: Once | INTRAVENOUS | Status: AC
  Administered 2017-08-21: 500 mL via INTRAVENOUS

## 2017-08-21 MED ORDER — LIDOCAINE HCL (PF) 1 % IJ SOLN
INTRAMUSCULAR | Status: AC
  Filled 2017-08-21: qty 30

## 2017-08-21 MED ORDER — CIPROFLOXACIN IN D5W 400 MG/200ML IV SOLN
INTRAVENOUS | Status: AC
  Administered 2017-08-21: 400 mg via INTRAVENOUS
  Filled 2017-08-21: qty 200

## 2017-08-21 MED ORDER — POTASSIUM CHLORIDE CRYS ER 20 MEQ PO TBCR
40.0000 meq | EXTENDED_RELEASE_TABLET | Freq: Once | ORAL | Status: AC
  Administered 2017-08-21: 40 meq via ORAL
  Filled 2017-08-21: qty 2

## 2017-08-21 MED ORDER — SODIUM CHLORIDE 0.9 % IV BOLUS (SEPSIS)
250.0000 mL | Freq: Once | INTRAVENOUS | Status: AC
  Administered 2017-08-21: 250 mL via INTRAVENOUS

## 2017-08-21 MED ORDER — ONDANSETRON HCL 4 MG/2ML IJ SOLN
INTRAMUSCULAR | Status: AC
  Filled 2017-08-21: qty 2

## 2017-08-21 MED ORDER — IOPAMIDOL (ISOVUE-300) INJECTION 61%
INTRAVENOUS | Status: AC
  Administered 2017-08-21: 10 mL
  Filled 2017-08-21: qty 50

## 2017-08-21 MED ORDER — FENTANYL CITRATE (PF) 100 MCG/2ML IJ SOLN
INTRAMUSCULAR | Status: AC
  Filled 2017-08-21: qty 4

## 2017-08-21 MED ORDER — FENTANYL CITRATE (PF) 100 MCG/2ML IJ SOLN
INTRAMUSCULAR | Status: AC | PRN
  Administered 2017-08-21: 50 ug via INTRAVENOUS

## 2017-08-21 MED ORDER — MIDAZOLAM HCL 2 MG/2ML IJ SOLN
INTRAMUSCULAR | Status: AC | PRN
  Administered 2017-08-21: 1 mg via INTRAVENOUS

## 2017-08-21 MED ORDER — MIDAZOLAM HCL 2 MG/2ML IJ SOLN
INTRAMUSCULAR | Status: AC
  Filled 2017-08-21: qty 4

## 2017-08-21 MED ORDER — LIDOCAINE HCL (PF) 1 % IJ SOLN
INTRAMUSCULAR | Status: AC | PRN
  Administered 2017-08-21: 10 mL

## 2017-08-21 MED ORDER — CIPROFLOXACIN IN D5W 400 MG/200ML IV SOLN
400.0000 mg | INTRAVENOUS | Status: AC
  Administered 2017-08-21: 400 mg via INTRAVENOUS

## 2017-08-21 MED ORDER — METHOTREXATE 2.5 MG PO TABS
17.5000 mg | ORAL_TABLET | ORAL | Status: DC
  Administered 2017-08-27: 17.5 mg via ORAL
  Filled 2017-08-21: qty 7

## 2017-08-21 NOTE — Consult Note (Signed)
[facility] CM Primary Care Navigator  08/21/2017  Kristina Torres 1946-04-07 [id number]    Went to see patient at the bedsideto identify possible discharge needs but sister (in the room)reports that patientis off the unit to radiology (interventional) at this time.  Will attemptto see patient at another time when she is available in the room.     Addendum: (08/22/17)   Went back to see patient to identify possible needs at discharge. Met with sister Kristina Torres) and friend Kristina Torres) as well. Patient reports having nausea, vomiting, lightheadedness, sleepiness and fatigue that had led to this admission. Patient's sister endorses Dr. [name] with [facility] as the primary care provider.  Patient had been driving prior to admission but husband Kristina Torres) will be transporting her to doctors' appointments after discharge.   Patient's sister shared using [facility] in [place] to obtain medications without difficulty. Patient manages her own medications at home using "pill box" system.  Sister reports that patient has a "good family support" to assist her after discharge. Patient's husband, sister and friend will be around to help her with care needs as stated.  Discharge plan is undetermined at this time pending resolution of hydronephrosis and acute kidney injury per MD note. Patient anticipates and hopes to go home as stated.  Patient and sister expressed understanding to call primary care provider's office when she returns home for a post discharge follow-up appointment within a week or sooner if needed.  Patient letter (with PCP's contact number) was provided as a reminder.  Explained to patient regarding[facility] CM services available for health management.  Patient and sister denied any needs or concerns at this time. Both voiced understanding to [facility] care management services from primary care provider if deemed necessaryin the  future.  [facility] care management information provided for future needs that she may have.   For questions, please contact:  Dannielle Huh, BSN, RN- [facility] Primary Care Navigator  Telephone: [phone] [place]

## 2017-08-21 NOTE — Consult Note (Signed)
Urology Consult  Referring physician: Dr. Blaine Hamper Reason for referral: right hydronephrosis, sepsis  Chief Complaint: abdominal pain  History of Present Illness: Ms Gertz is a 71yo with a hx of RA, HTN who presented to the ER with a 3 day history of abdominal pain with associated nausea and vomiting. No history of nephrolithiasis. She denies any LUTS. No dysuria or hematuria. Ct scan from the ER showed bulky retroperitoneal adenopathy and severe right hydronephrosis to the level of the bladder. There is an associated mass in the bladder likely representing a bladder tumor versus a ureteral tumor. Creatinine is elevated at 2.2. No hx of pelvic radiation. Currently she has dull constant, moderate nonradiating right abdominal/flank pain.   Past Medical History:  Diagnosis Date  . Essential hypertension   . Gout   . HLD (hyperlipidemia)   . Rheumatoid arthritis [facility])    Past Surgical History:  Procedure Laterality Date  . APPENDECTOMY    . CARPAL TUNNEL RELEASE Right   . LAPAROSCOPIC SALPINGO OOPHERECTOMY Right     Medications: I have reviewed the patient's current medications. Allergies:  Allergies  Allergen Reactions  . Streptomycin Anaphylaxis    Family History  Problem Relation Age of Onset  . CVA Mother    Social History:  reports that she quit smoking about 2 weeks ago. She has never used smokeless tobacco. She reports that she does not drink alcohol or use drugs.  Review of Systems  Constitutional: Positive for chills and malaise/fatigue.  Gastrointestinal: Positive for abdominal pain, nausea and vomiting.  Genitourinary: Positive for flank pain and urgency.  All other systems reviewed and are negative.   Physical Exam:  Vital signs in last 24 hours: Temp:  [97.6 F (36.4 C)-98.5 F (36.9 C)] 98.1 F (36.7 C) (09/04 0538) Pulse Rate:  [71-98] 75 (09/04 0538) Resp:  [18-27] 18 (09/04 0538) BP: (83-119)/(48-65) 108/48 (09/04 0538) SpO2:  [90 %-99 %] 90 % (09/04  0538) Weight:  [79.4 kg (175 lb)-82.2 kg (181 lb 4.8 oz)] 82.2 kg (181 lb 4.8 oz) (09/04 0023) Physical Exam  Constitutional: She is oriented to person, place, and time. She appears well-developed and well-nourished.  HENT:  Head: Normocephalic and atraumatic.  Eyes: Pupils are equal, round, and reactive to light. EOM are normal.  Neck: Normal range of motion. No thyromegaly present.  Cardiovascular: Normal rate and regular rhythm.   Respiratory: Effort normal. No respiratory distress.  GI: Soft. She exhibits no distension and no mass. There is no tenderness. There is no rebound and no guarding.  Musculoskeletal: Normal range of motion. She exhibits no edema.  Neurological: She is alert and oriented to person, place, and time.  Skin: Skin is warm and dry.  Psychiatric: She has a normal mood and affect. Her behavior is normal. Judgment and thought content normal.    Laboratory Data:  Results for orders placed or performed during the hospital encounter of 08/20/17 (from the past 72 hour(s))  Basic metabolic panel     Status: Abnormal   Collection Time: 08/20/17  5:54 PM  Result Value Ref Range   Sodium 132 (L) 135 - 145 mmol/L   Potassium 3.9 3.5 - 5.1 mmol/L   Chloride 92 (L) 101 - 111 mmol/L   CO2 29 22 - 32 mmol/L   Glucose, Bld 113 (H) 65 - 99 mg/dL   BUN 27 (H) 6 - 20 mg/dL   Creatinine, Ser 2.08 (H) 0.44 - 1.00 mg/dL   Calcium 13.7 (HH) 8.9 - 10.3 mg/dL  Comment: CRITICAL RESULT CALLED TO, READ BACK BY AND VERIFIED WITH: T. MORRIS RN [phone] GREEN R    GFR calc non Af Amer 23 (L) >60 mL/min   GFR calc Af Amer 26 (L) >60 mL/min    Comment: (NOTE) The eGFR has been calculated using the CKD EPI equation. This calculation has not been validated in all clinical situations. eGFR's persistently <60 mL/min signify possible Chronic Kidney Disease.    Anion gap 11 5 - 15  CBC     Status: Abnormal   Collection Time: 08/20/17  5:54 PM  Result Value Ref Range   WBC 19.0 (H)  4.0 - 10.5 K/uL   RBC 3.13 (L) 3.87 - 5.11 MIL/uL   Hemoglobin 10.3 (L) 12.0 - 15.0 g/dL   HCT 31.8 (L) 36.0 - 46.0 %   MCV 101.6 (H) 78.0 - 100.0 fL   MCH 32.9 26.0 - 34.0 pg   MCHC 32.4 30.0 - 36.0 g/dL   RDW 17.0 (H) 11.5 - 15.5 %   Platelets 123 (L) 150 - 400 K/uL  Hepatic function panel     Status: Abnormal   Collection Time: 08/20/17  7:31 PM  Result Value Ref Range   Total Protein 5.2 (L) 6.5 - 8.1 g/dL   Albumin 2.8 (L) 3.5 - 5.0 g/dL   AST 29 15 - 41 U/L   ALT 32 14 - 54 U/L   Alkaline Phosphatase 90 38 - 126 U/L   Total Bilirubin 2.0 (H) 0.3 - 1.2 mg/dL   Bilirubin, Direct 0.7 (H) 0.1 - 0.5 mg/dL   Indirect Bilirubin 1.3 (H) 0.3 - 0.9 mg/dL  I-Stat Chem 8, ED     Status: Abnormal   Collection Time: 08/20/17  7:33 PM  Result Value Ref Range   Sodium 131 (L) 135 - 145 mmol/L   Potassium 3.7 3.5 - 5.1 mmol/L   Chloride 91 (L) 101 - 111 mmol/L   BUN 31 (H) 6 - 20 mg/dL   Creatinine, Ser 2.20 (H) 0.44 - 1.00 mg/dL   Glucose, Bld 110 (H) 65 - 99 mg/dL   Calcium, Ion 1.84 (HH) 1.15 - 1.40 mmol/L   TCO2 33 (H) 22 - 32 mmol/L   Hemoglobin 9.5 (L) 12.0 - 15.0 g/dL   HCT 28.0 (L) 36.0 - 46.0 %   Comment NOTIFIED PHYSICIAN   I-Stat CG4 Lactic Acid, ED  (not at  [facility])     Status: Abnormal   Collection Time: 08/20/17  7:41 PM  Result Value Ref Range   Lactic Acid, Venous 2.70 (HH) 0.5 - 1.9 mmol/L   Comment NOTIFIED PHYSICIAN   Urinalysis, Routine w reflex microscopic     Status: Abnormal   Collection Time: 08/20/17  9:15 PM  Result Value Ref Range   Color, Urine YELLOW YELLOW   APPearance HAZY (A) CLEAR   Specific Gravity, Urine 1.013 1.005 - 1.030   pH 5.0 5.0 - 8.0   Glucose, UA NEGATIVE NEGATIVE mg/dL   Hgb urine dipstick NEGATIVE NEGATIVE   Bilirubin Urine NEGATIVE NEGATIVE   Ketones, ur NEGATIVE NEGATIVE mg/dL   Protein, ur NEGATIVE NEGATIVE mg/dL   Nitrite NEGATIVE NEGATIVE   Leukocytes, UA NEGATIVE NEGATIVE  Lactic acid, plasma     Status: Abnormal    Collection Time: 08/20/17 10:51 PM  Result Value Ref Range   Lactic Acid, Venous 2.4 (HH) 0.5 - 1.9 mmol/L    Comment: CRITICAL RESULT CALLED TO, READ BACK BY AND VERIFIED WITH: HINSON D,RN 08/20/17 2345  WAYK   Save smear     Status: None   Collection Time: 08/20/17 10:55 PM  Result Value Ref Range   Smear Review SMEAR STAINED AND AVAILABLE FOR REVIEW   Lactate dehydrogenase     Status: Abnormal   Collection Time: 08/20/17 10:55 PM  Result Value Ref Range   LDH 562 (H) 98 - 192 U/L  Procalcitonin     Status: None   Collection Time: 08/20/17 10:55 PM  Result Value Ref Range   Procalcitonin 1.20 ng/mL    Comment:        Interpretation: PCT > 0.5 ng/mL and <= 2 ng/mL: Systemic infection (sepsis) is possible, but other conditions are known to elevate PCT as well. (NOTE)         ICU PCT Algorithm               Non ICU PCT Algorithm    ----------------------------     ------------------------------         PCT < 0.25 ng/mL                 PCT < 0.1 ng/mL     Stopping of antibiotics            Stopping of antibiotics       strongly encouraged.               strongly encouraged.    ----------------------------     ------------------------------       PCT level decrease by               PCT < 0.25 ng/mL       >= 80% from peak PCT       OR PCT 0.25 - 0.5 ng/mL          Stopping of antibiotics                                             encouraged.     Stopping of antibiotics           encouraged.    ----------------------------     ------------------------------       PCT level decrease by              PCT >= 0.25 ng/mL       < 80% from peak PCT        AND PCT >= 0.5 ng/mL             Continuing antibiotics                                              encouraged.       Continuing antibiotics            encouraged.    ----------------------------     ------------------------------     PCT level increase compared          PCT > 0.5 ng/mL         with peak PCT AND          PCT >=  0.5 ng/mL             Escalation of antibiotics  strongly encouraged.      Escalation of antibiotics        strongly encouraged.   Protime-INR     Status: None   Collection Time: 08/20/17 10:55 PM  Result Value Ref Range   Prothrombin Time 14.7 11.4 - 15.2 seconds   INR 1.16   APTT     Status: None   Collection Time: 08/20/17 10:55 PM  Result Value Ref Range   aPTT 24 24 - 36 seconds  Lipase, blood     Status: None   Collection Time: 08/20/17 10:55 PM  Result Value Ref Range   Lipase 22 11 - 51 U/L  I-Stat CG4 Lactic Acid, ED  (not at  [facility])     Status: Abnormal   Collection Time: 08/20/17 11:06 PM  Result Value Ref Range   Lactic Acid, Venous 2.32 (HH) 0.5 - 1.9 mmol/L   Comment NOTIFIED PHYSICIAN   Vitamin B12     Status: None   Collection Time: 08/21/17 12:36 AM  Result Value Ref Range   Vitamin B-12 767 180 - 914 pg/mL    Comment: (NOTE) This assay is not validated for testing neonatal or myeloproliferative syndrome specimens for Vitamin B12 levels.   Folate     Status: None   Collection Time: 08/21/17 12:36 AM  Result Value Ref Range   Folate 9.7 >5.9 ng/mL  Iron and TIBC     Status: Abnormal   Collection Time: 08/21/17 12:36 AM  Result Value Ref Range   Iron 29 28 - 170 ug/dL   TIBC 214 (L) 250 - 450 ug/dL   Saturation Ratios 14 10.4 - 31.8 %   UIBC 185 ug/dL  Ferritin     Status: Abnormal   Collection Time: 08/21/17 12:36 AM  Result Value Ref Range   Ferritin 745 (H) 11 - 307 ng/mL  Reticulocytes     Status: Abnormal   Collection Time: 08/21/17 12:36 AM  Result Value Ref Range   Retic Ct Pct 3.9 (H) 0.4 - 3.1 %   RBC. 2.41 (L) 3.87 - 5.11 MIL/uL   Retic Count, Absolute 94.0 19.0 - 186.0 K/uL  CBC with Differential/Platelet     Status: Abnormal   Collection Time: 08/21/17 12:36 AM  Result Value Ref Range   WBC 15.1 (H) 4.0 - 10.5 K/uL   RBC 2.40 (L) 3.87 - 5.11 MIL/uL   Hemoglobin 8.0 (L) 12.0 - 15.0 g/dL   HCT  24.2 (L) 36.0 - 46.0 %   MCV 100.8 (H) 78.0 - 100.0 fL   MCH 33.3 26.0 - 34.0 pg   MCHC 33.1 30.0 - 36.0 g/dL   RDW 16.9 (H) 11.5 - 15.5 %   Platelets 100 (L) 150 - 400 K/uL    Comment: REPEATED TO VERIFY PLATELET COUNT CONFIRMED BY SMEAR    Neutrophils Relative % 49 %   Lymphocytes Relative 25 %   Monocytes Relative 25 %   Eosinophils Relative 0 %   Basophils Relative 1 %   Neutro Abs 7.3 1.7 - 7.7 K/uL   Lymphs Abs 3.8 0.7 - 4.0 K/uL   Monocytes Absolute 3.8 (H) 0.1 - 1.0 K/uL   Eosinophils Absolute 0.0 0.0 - 0.7 K/uL   Basophils Absolute 0.2 (H) 0.0 - 0.1 K/uL   RBC Morphology POLYCHROMASIA PRESENT    WBC Morphology ATYPICAL LYMPHOCYTES   Basic metabolic panel     Status: Abnormal   Collection Time: 08/21/17 12:36 AM  Result Value Ref Range   Sodium 133 (  L) 135 - 145 mmol/L   Potassium 3.5 3.5 - 5.1 mmol/L   Chloride 96 (L) 101 - 111 mmol/L   CO2 29 22 - 32 mmol/L   Glucose, Bld 122 (H) 65 - 99 mg/dL   BUN 28 (H) 6 - 20 mg/dL   Creatinine, Ser 1.74 (H) 0.44 - 1.00 mg/dL   Calcium 12.3 (H) 8.9 - 10.3 mg/dL   GFR calc non Af Amer 28 (L) >60 mL/min   GFR calc Af Amer 33 (L) >60 mL/min    Comment: (NOTE) The eGFR has been calculated using the CKD EPI equation. This calculation has not been validated in all clinical situations. eGFR's persistently <60 mL/min signify possible Chronic Kidney Disease.    Anion gap 8 5 - 15  Basic metabolic panel     Status: Abnormal   Collection Time: 08/21/17 12:36 AM  Result Value Ref Range   Sodium 132 (L) 135 - 145 mmol/L   Potassium 3.5 3.5 - 5.1 mmol/L   Chloride 96 (L) 101 - 111 mmol/L   CO2 27 22 - 32 mmol/L   Glucose, Bld 117 (H) 65 - 99 mg/dL   BUN 28 (H) 6 - 20 mg/dL   Creatinine, Ser 1.75 (H) 0.44 - 1.00 mg/dL   Calcium 12.2 (H) 8.9 - 10.3 mg/dL   GFR calc non Af Amer 28 (L) >60 mL/min   GFR calc Af Amer 33 (L) >60 mL/min    Comment: (NOTE) The eGFR has been calculated using the CKD EPI equation. This calculation has not  been validated in all clinical situations. eGFR's persistently <60 mL/min signify possible Chronic Kidney Disease.    Anion gap 9 5 - 15  CBC     Status: Abnormal   Collection Time: 08/21/17  2:33 AM  Result Value Ref Range   WBC 15.9 (H) 4.0 - 10.5 K/uL   RBC 2.77 (L) 3.87 - 5.11 MIL/uL   Hemoglobin 9.1 (L) 12.0 - 15.0 g/dL   HCT 27.9 (L) 36.0 - 46.0 %   MCV 100.7 (H) 78.0 - 100.0 fL   MCH 32.9 26.0 - 34.0 pg   MCHC 32.6 30.0 - 36.0 g/dL   RDW 17.1 (H) 11.5 - 15.5 %   Platelets 111 (L) 150 - 400 K/uL    Comment: CONSISTENT WITH PREVIOUS RESULT  Lactic acid, plasma     Status: Abnormal   Collection Time: 08/21/17  2:33 AM  Result Value Ref Range   Lactic Acid, Venous 2.2 (HH) 0.5 - 1.9 mmol/L    Comment: CRITICAL RESULT CALLED TO, READ BACK BY AND VERIFIED WITH: WICKER K,RN 08/21/17 0312 WAYK    No results found for this or any previous visit (from the past 240 hour(s)). Creatinine:  Recent Labs  08/20/17 1754 08/20/17 1933 08/21/17 0036  CREATININE 2.08* 2.20* 1.75*  1.74*   Baseline Creatinine: unknown  Impression/Assessment:  70yo with right hydronephrosis, sepsis, ureteral versus bladder mass  Plan:  1. Right hydronephrosis: I discussed the various causes of distal hydronephrosis with the patient including stricture, nephrolithiasis and malignancy. I discussed the management including ureteral stent placement versus nephrostomy tube placement. Given the severity of the hydronephrosis with the likely mass in the ureter/bladder ureteral stents would likely fail. I would recommend nephrostomy tube placement with interventional radiology.  2. Continue broad spectrum antibiotics pending urine culture 3. Ureteral/bladder mass: We will schedule for cystoscopy as an outpatient pending resolution of her sepsis.   Nicolette Bang 08/21/2017, 8:50 AM

## 2017-08-21 NOTE — Procedures (Signed)
R PCN 10 Fr Clear reddish urine sent for Cx EBL 0 Comp 0

## 2017-08-21 NOTE — Sedation Documentation (Signed)
Patient is resting comfortably. 

## 2017-08-21 NOTE — Sedation Documentation (Signed)
Patient denies pain and is resting comfortably.  

## 2017-08-21 NOTE — Progress Notes (Signed)
Patient seen in the bathroom on the floor, when asked patient replied that she was trying to wipe herself, legs gave out and slid to the floor.  Patient was apoligizing for not listening or waiting for the NT to help as the NT clearly stated to the patient to wait so that NT can bring wash clothes and linen.  VS taken BP 81/60 P 92, notified Dr. Karleen Hampshire, new order for NS 250 ml bolus given.  Notified husband regarding the fall, he verbalized understanding and was not surprised and was very appreciative for calling him.  Patient changed to low bed.  Will continue to monitor.

## 2017-08-21 NOTE — Consult Note (Signed)
[place]  [phone]   [place]  DOB: [birth year]  MR#: [id number]  CSN#: [id number]    Requesting Physician: [facility]  Patient Care Team: [name], MD as PCP - General (Internal Medicine)  Reason for consult:  Liver mass in right hepatic lobe, RP adenopathy, rule out malignancy   History of present illness:   Kristina Torres presented to the hospital on 08/20/17 for N/V along with lightheadedness, headache, and cough. She was admitted for right hydronephrosis. I was called to evaluate her skin plan is, and mild pancytopenia, to rule out malignancy.  She has not been feeling well for 2 weeks. BP med was doubled then decreased by PCP, that's when things started to "deteriorate," per husband.  2 weeks ago, appetite decreased and po intake has been low. Denies fever or pain; she has RA so "it's hard to tell, sometimes." RA in thumb is problematic. Methotrexate taken 4 tablets Monday, 3 on Tuesday weekly for 12 years. Never experienced low counts from MTX. Rheumatologist recently retired, so PCP assumed care of this. Last seen in April for RA.   A liver lesion found on MRI in 2011 thought to be hemangioma. She was told not to engage in contact sports. CT scan 08/20/2017 showed the same lesion, still likely representing hemangioma, likely not malignancy. CT also revealed enlarged retroperitoneal lymph nodes. Denies kidney disease, decreased urine output or darker urine. Denies flank pain. Colonoscopy 4 years ago, polyps identified.   Her husbands reports she has been slightly confused at home for 4-5 days. This may be caused by elevated calcium.   Benign calcifications in the liver likely from hemangioma. abnormally enlarged lymph nodes in the abdomen identified. Can be reactive from autoimmune disease.   Elevated Ca, decreased appetite, anemia, weakness, and fatigue can be related to malignancy. I  recommend biopsy of a lymph node and CT chest to rule out lymphoma.   Low blood counts likely related to MTX, we will monitor. We will consider bone marrow biopsy if this worsens or persists.   Mammo 02/2017  Was normal.  Denies blood clots. Denies depression or mood disorder.  Smoking hx 40 years  REVIEW OF SYSTEMS:   Constitutional: Denies fevers, chills or abnormal night sweats, (+) fatigue  Eyes: Denies blurriness of vision, double vision or watery eyes Ears, nose, mouth, throat, and face: Denies mucositis or sore throat Respiratory: Denies cough, dyspnea or wheezes Cardiovascular: Denies palpitation, chest discomfort or lower extremity swelling Gastrointestinal:  Denies nausea, heartburn or change in bowel habits Skin: Denies abnormal skin rashes Lymphatics: Denies new lymphadenopathy or easy bruising Neurological:Denies numbness, tingling or new weaknesses Behavioral/Psych: Mood is stable, no new changes  All other systems were reviewed with the patient and are negative.  MEDICAL HISTORY:  Past Medical History:  Diagnosis Date  . Essential hypertension   . Gout   . HLD (hyperlipidemia)   . Rheumatoid arthritis ([place])     SURGICAL HISTORY: Past Surgical History:  Procedure Laterality Date  . APPENDECTOMY    . CARPAL TUNNEL RELEASE Right   . IR NEPHROSTOMY PLACEMENT RIGHT  08/21/2017  . LAPAROSCOPIC SALPINGO OOPHERECTOMY Right     SOCIAL HISTORY: Social History   Social History  . Marital status: Married    Spouse name: N/A  . Number of children: N/A  . Years of education: N/A   Occupational History  . Not on file.   Social History Main Topics  .  Smoking status: Former Smoker    Quit date: 08/06/2017  . Smokeless tobacco: Never Used  . Alcohol use No  . Drug use: No  . Sexual activity: Not on file   Other Topics Concern  . Not on file   Social History Narrative  . No narrative on file    FAMILY HISTORY: Family History  Problem Relation Age of Onset   . CVA Mother     ALLERGIES:  is allergic to streptomycin.  MEDICATIONS:  Current Facility-Administered Medications  Medication Dose Route Frequency Provider Last Rate Last Dose  . 0.9 %  sodium chloride infusion   Intravenous Continuous Lorretta Harp, MD 125 mL/hr at 08/21/17 0012    . acetaminophen (TYLENOL) tablet 650 mg  650 mg Oral Q6H PRN Lorretta Harp, MD       Or  . acetaminophen (TYLENOL) suppository 650 mg  650 mg Rectal Q6H PRN Lorretta Harp, MD      . allopurinol (ZYLOPRIM) tablet 300 mg  300 mg Oral Daily Lorretta Harp, MD   300 mg at 08/21/17 1126  . cefTRIAXone (ROCEPHIN) 1 g in dextrose 5 % 50 mL IVPB  1 g Intravenous Q24H Eber Hong, MD      . dextromethorphan-guaiFENesin [facility] DM) 30-600 MG per 12 hr tablet 1 tablet  1 tablet Oral BID Lorretta Harp, MD   1 tablet at 08/21/17 1126  . fentaNYL (SUBLIMAZE) 100 MCG/2ML injection           . hydrALAZINE (APRESOLINE) injection 5 mg  5 mg Intravenous Q2H PRN Lorretta Harp, MD      . lidocaine (PF) (XYLOCAINE) 1 % injection           . lidocaine (PF) (XYLOCAINE) 1 % injection    PRN Hoss, Arthur, MD   10 mL at 08/21/17 1425  . [START ON 08/27/2017] methotrexate (RHEUMATREX) tablet 17.5 mg  17.5 mg Oral Weekly Bajbus, Lauren D, RPH      . midazolam (VERSED) 2 MG/2ML injection           . ondansetron (ZOFRAN) injection 4 mg  4 mg Intravenous Q8H PRN Lorretta Harp, MD   4 mg at 08/21/17 0552  . simvastatin (ZOCOR) tablet 20 mg  20 mg Oral Daily Lorretta Harp, MD   20 mg at 08/21/17 1126  . zolpidem (AMBIEN) tablet 5 mg  5 mg Oral QHS PRN Lorretta Harp, MD        PHYSICAL EXAMINATION: ECOG PERFORMANCE STATUS: 3 - Symptomatic, >50% confined to bed  Vitals:   08/21/17 1405 08/21/17 1415  BP: (!) 104/56 107/67  Pulse: 72 76  Resp: (!) 28 (!) 26  Temp:    SpO2: 98% 97%   Filed Weights   08/20/17 1735 08/21/17 0023  Weight: 175 lb (79.4 kg) 181 lb 4.8 oz (82.2 kg)    GENERAL:alert, no distress and comfortable SKIN: skin color, texture, turgor  are normal, no rashes or significant lesions EYES: normal, conjunctiva are pink and non-injected, sclera clear OROPHARYNX:no exudate, no erythema and lips, buccal mucosa, and tongue normal  NECK: supple, thyroid normal size, non-tender, without nodularity LYMPH:  no palpable lymphadenopathy in the cervical, axillary or inguinal LUNGS: clear to auscultation and percussion with normal breathing effort HEART: regular rate & rhythm and no murmurs and no lower extremity edema ABDOMEN:abdomen soft, non-tender and normal bowel sounds Musculoskeletal:no cyanosis of digits and no clubbing  PSYCH: alert & oriented x 3 with fluent speech NEURO: no focal motor/sensory deficits  LABORATORY  DATA:  I have reviewed the data as listed Lab Results  Component Value Date   WBC 15.9 (H) 08/21/2017   HGB 9.1 (L) 08/21/2017   HCT 27.9 (L) 08/21/2017   MCV 100.7 (H) 08/21/2017   PLT 111 (L) 08/21/2017    Recent Labs  08/20/17 1754 08/20/17 1931 08/20/17 1933 08/21/17 0036 08/21/17 0832  NA 132*  --  131* 132*  133* 135  K 3.9  --  3.7 3.5  3.5 3.3*  CL 92*  --  91* 96*  96* 100*  CO2 29  --   --  '27  29 23  '$ GLUCOSE 113*  --  110* 117*  122* 85  BUN 27*  --  31* 28*  28* 28*  CREATININE 2.08*  --  2.20* 1.75*  1.74* 1.56*  CALCIUM 13.7*  --   --  12.2*  12.3* 12.8*  GFRNONAA 23*  --   --  28*  28* 32*  GFRAA 26*  --   --  33*  33* 37*  PROT  --  5.2*  --   --   --   ALBUMIN  --  2.8*  --   --  2.0*  AST  --  29  --   --   --   ALT  --  32  --   --   --   ALKPHOS  --  90  --   --   --   BILITOT  --  2.0*  --   --   --   BILIDIR  --  0.7*  --   --   --   IBILI  --  1.3*  --   --   --     RADIOGRAPHIC STUDIES: I have personally reviewed the radiological images as listed and agreed with the findings in the report. Ct Abdomen Pelvis Wo Contrast  Result Date: 08/20/2017 CLINICAL DATA:  Abdominal pain EXAM: CT ABDOMEN AND PELVIS WITHOUT CONTRAST TECHNIQUE: Multidetector CT imaging of  the abdomen and pelvis was performed following the standard protocol without IV contrast. COMPARISON:  MRI 02/10/2010 FINDINGS: Lower chest: Lung bases demonstrate no acute consolidation or pleural effusion. The heart size is within normal limits. Hepatobiliary: Vague hypodense mass with central calcification in the right hepatic lobe measuring at least 8.5 cm and presumably corresponding to the 2011 demonstrated MRI mass/ thought than to represent hemangioma. No calcified gallstones. No biliary dilatation Pancreas: Unremarkable. No pancreatic ductal dilatation or surrounding inflammatory changes. Spleen: Enlarged, measuring 14 cm Adrenals/Urinary Tract: Left adrenal gland is within normal limits. Possible cyst in the mid to lower pole left kidney. Severe hydronephrosis and hydroureter of the right kidney with distal tapering of the ureter. Bladder within normal limits. No stones are seen Stomach/Bowel: The stomach is nonenlarged. There is no dilated small bowel. No colon wall thickening. Vascular/Lymphatic: Aortic atherosclerosis. Multiple enlarged periaortic/retroperitoneal lymph nodes, measuring up to 2 cm. Right greater than left iliac adenopathy. Reproductive: Calcified uterine fibroids.  No adnexal masses. Other: Negative for free air or free fluid. Musculoskeletal: Degenerative changes. No acute or suspicious bone lesion IMPRESSION: 1. Vague hypodense mass with central calcification in the right hepatic lobe presumably corresponding to the MRI mass from 2011 which was thought to represent hemangioma 2. Moderate retroperitoneal somewhat bulky adenopathy. Splenomegaly. Findings raise concern for metastatic disease or lymphoproliferative abnormality such as lymphoma. Clinical correlation is recommended 3. Negative for a bowel obstruction or colon wall thickening 4. Severe right hydronephrosis and hydroureter  with distal tapering of the ureter. No definitive stones are seen, findings could be secondary to a  stricture 5. Calcified uterine fibroids Electronically Signed   By: Donavan Foil M.D.   On: 08/20/2017 23:55   Dg Chest 2 View  Result Date: 08/20/2017 CLINICAL DATA:  Weakness nausea and vomiting EXAM: CHEST  2 VIEW COMPARISON:  None. FINDINGS: The heart size and mediastinal contours are within normal limits. Both lungs are clear. Degenerative changes of the spine. IMPRESSION: No active cardiopulmonary disease. Electronically Signed   By: Donavan Foil M.D.   On: 08/20/2017 19:42   Ir Nephrostomy Placement Right  Result Date: 08/21/2017 INDICATION: Right ureteral obstruction EXAM: PERCUTANEOUS RIGHT NEPHROSTOMY PLACEMENT COMPARISON:  None. MEDICATIONS: Ciprofloxacin 400 mg IV; The antibiotic was administered in an appropriate time frame prior to skin puncture. ANESTHESIA/SEDATION: Fentanyl 50 mcg IV; Versed 1 mg IV Moderate Sedation Time:  18 The patient was continuously monitored during the procedure by the interventional radiology nurse under my direct supervision. CONTRAST:  10 cc Isovue-300 - administered into the collecting system(s) FLUOROSCOPY TIME:  Fluoroscopy Time: 1 minutes  seconds (3 mGy). COMPLICATIONS: None immediate. PROCEDURE: Informed written consent was obtained from the patient after a thorough discussion of the procedural risks, benefits and alternatives. All questions were addressed. Maximal Sterile Barrier Technique was utilized including caps, mask, sterile gowns, sterile gloves, sterile drape, hand hygiene and skin antiseptic. A timeout was performed prior to the initiation of the procedure. The back was prepped and draped in a sterile fashion. Under sonographic guidance, a 21 gauge needle was inserted into a posterior lower pole calyx and removed over a 018 wire. This was up sized to an Amplatz. Ten [place] dilator followed by a 10 [place] drain were inserted. It was looped and string fixed in the renal pelvis. Contrast was injected. A sample of urine was sent for culture. FINDINGS:  Imaging documents placement of a 10 French right percutaneous nephrostomy catheter into the renal pelvis. IMPRESSION: Successful right percutaneous nephrostomy catheter placement. Electronically Signed   By: Marybelle Killings M.D.   On: 08/21/2017 14:48    ASSESSMENT & PLAN:  Avital Dancy is a 71 y.o. female with a history of HTN, gout, HLD and Rheumatoid Arthritis.   1. Liver Mass in Right hepatic Lobe and PR adenopathy , rule out lymphoma or malignancy 2. Macrocytic anemia, likely secondary to MTX and anemia of chronic disease  3. Mild thrombocytopenia, possibly related to MTX, or autoimmune mediated 4. RA 5. HTN  6. Right hydronephrosis secondary to ureteral obstruction, status post nephrostomy tube placement 7. Hypercalcemia   Recommendations: -scan reviewed, her liver lesion is calcified, probably hemangioma which was found on her prior MRI in 2011. This is not suspicious for malignancy  -she does have retroperitoneal adenopathy, largest lymph node 2 cm, given her recent fatigue, anorexia, and weight loss, hypercalcemia and significantly elevated LDH, malignancy needs to be ruled out. It is feasible, I'll recommend IR CT-guided biopsy.  -given her extensive history of smoking, I will also recommend a CT chest wo contrast to rule out malignancy  -if the above work up negative, then I  think her symptoms probably related to her hydronephrosis, and she should recover after nephrostomy tube placement. I presume she will follow-up with urology  -If her RP node is difficult to biopsy, I think it's OK to followup with a CT scan in 2 months.  -I recommend one dose of pamidronate '60mg'$  for her persistent hypercalcemia.  -will f/u after  biopsy. Please call me for questions   All questions were answered. The patient knows to call the clinic with any problems, questions or concerns.      Truitt Merle, MD 08/21/2017 6:41 PM

## 2017-08-21 NOTE — Progress Notes (Signed)
Patient ID: Kristina Torres, female   DOB: 1946/12/12, 71 y.o.   MRN: [id number] Request for a lymph node biopsy has been made. The patient requires CT with contrast to delineate anatomy prior to LN biopsy.

## 2017-08-21 NOTE — Progress Notes (Signed)
New Admission Note:  Arrival Method: Stretcher  Mental Orientation: Alert and oriented x 4 Telemetry: N/A Assessment: Completed Skin: Warm and dry.  IV: NSL's Pain: Denies  Tubes: N/A Safety Measures: Safety Fall Prevention Plan was given, discussed and signed. Admission: Completed 2 [place] Orientation: Patient has been orientated to the room, unit and the staff. Family: Husband  Orders have been reviewed and implemented. Will continue to monitor the patient. Call light has been placed within reach and bed alarm has been activated.   Sima Matas BSN, RN  Phone Number: [phone]

## 2017-08-21 NOTE — Progress Notes (Signed)
PROGRESS NOTE    Kristina Torres  KKX:[id number] DOB: 10/09/1946 DOA: 08/20/2017 PCP: [name], MD   Brief Narrative: Kristina Torres is a 71 y.o. female with medical history significant of hypertension, hyperlipidemia, gout, rheumatoid arthritis, renal mass, who presents with nausea, vomiting, lightheadedness, headache and cough. She was found to have severe right hydronephrosis and hydroureter, along with AKI. CT abd without contrast showed retroperitoneal adenopathy.  Urology consulted and she is undergoing IR percutaneous nephrostomy, oncology consulted to evaluate the retroperitoneal adenopathy and evaluation of lymphoma.    Assessment & Plan:   Principal Problem:   Nausea & vomiting Active Problems:   Rheumatoid arthritis ([place])   Essential hypertension   Gout   UTI (urinary tract infection)   Sepsis ([facility])   HLD (hyperlipidemia)   Hypotension   AKI (acute kidney injury) ([place])   Hypercalcemia   Thrombocytopenia ([facility])   Cough   Macrocytic anemia    Severe right hydronephrosis and hydroureter: would explain her AKI and UTI and her symptoms of nausea, and vomiting.  Urology consulted and plan for IR  Percutaneous nephrostomy tube placement on the right  today.  Hydrate with IV fluids.  Repeat renal parameters in am.  Continue with rocephin for UTI.  Urine cultures pending. Blood cultures are pending.    Hypotension/ SIRS: Improving with fluid boluses and maintenance fluids.    Nausea and vomiting:  Resolved.    Hypercalcemia:  Hydrate and rpeat calcium level in am.    Elevated lactic acid: possibly from UTI/SIRS/Dehydration.  Hydrate and repeat later today.    Anemia:  Macrocytic.  Anemia panel reviewed.    Hypokalemia: repleted.    Thrombocytopenia and anemia:  - unclear etiology, in view of the retroperitoneal adenopathy, will need LN biopsy for evaluation of lymphoma.  Oncology consulted.   AKI:  Suspect a combination of ATN from  infection and pre renal from dehydration.  Improving with IV fluids.   RA: - on methotrexate.   DVT prophylaxis: SCD'S Code Status:  Full code.  Family Communication: none at bedside. Spoke briefly over the phone with the patient's husband.  Disposition Plan: pending resolution of the hydronephrosis and AKI. Marland Kitchen   Consultants:   IR  UROLOGY.   Procedures: IR nephrostomy tube placement.  Antimicrobials: rocephin. Since admission.  Subjective: Reports feeling better, coughing.   Objective: Vitals:   08/20/17 2334 08/21/17 0023 08/21/17 0538 08/21/17 0900  BP: 115/61 (!) 119/56 (!) 108/48 (!) 81/60  Pulse: 77 71 75 92  Resp: (!) 21 19 18 18   Temp:  97.6 F (36.4 C) 98.1 F (36.7 C) 98.1 F (36.7 C)  TempSrc:  Oral Oral Oral  SpO2: 93% 97% 90% 90%  Weight:  82.2 kg (181 lb 4.8 oz)    Height:        Intake/Output Summary (Last 24 hours) at 08/21/17 1138 Last data filed at 08/21/17 0800  Gross per 24 hour  Intake              725 ml  Output              100 ml  Net              625 ml   Filed Weights   08/20/17 1735 08/21/17 0023  Weight: 79.4 kg (175 lb) 82.2 kg (181 lb 4.8 oz)    Examination:  General exam: Appears calm and comfortable  Respiratory system: Clear to auscultation. Respiratory effort normal. Cardiovascular system: S1 & S2 heard,  RRR. No JVD, murmurs, rubs, gallops or clicks. No pedal edema. Gastrointestinal system: Abdomen is nondistended, soft and nontender. No organomegaly or masses felt. Normal bowel sounds heard. Central nervous system: Alert and oriented. No focal neurological deficits. Extremities: Symmetric 5 x 5 power. Skin: No rashes, lesions or ulcers Psychiatry: Judgement and insight appear normal. Mood & affect appropriate.     Data Reviewed: I have personally reviewed following labs and imaging studies  CBC:  Recent Labs Lab 08/20/17 1754 08/20/17 1933 08/21/17 0036 08/21/17 0233  WBC 19.0*  --  15.1* 15.9*  NEUTROABS  --    --  7.3  --   HGB 10.3* 9.5* 8.0* 9.1*  HCT 31.8* 28.0* 24.2* 27.9*  MCV 101.6*  --  100.8* 100.7*  PLT 123*  --  100* 962*   Basic Metabolic Panel:  Recent Labs Lab 08/20/17 1754 08/20/17 1933 08/21/17 0036 08/21/17 0832  NA 132* 131* 132*  133* 135  K 3.9 3.7 3.5  3.5 3.3*  CL 92* 91* 96*  96* 100*  CO2 29  --  27  29 23   GLUCOSE 113* 110* 117*  122* 85  BUN 27* 31* 28*  28* 28*  CREATININE 2.08* 2.20* 1.75*  1.74* 1.56*  CALCIUM 13.7*  --  12.2*  12.3* 12.8*   GFR: Estimated Creatinine Clearance: 30.3 mL/min (A) (by C-G formula based on SCr of 1.56 mg/dL (H)). Liver Function Tests:  Recent Labs Lab 08/20/17 1931 08/21/17 0832  AST 29  --   ALT 32  --   ALKPHOS 90  --   BILITOT 2.0*  --   PROT 5.2*  --   ALBUMIN 2.8* 2.0*    Recent Labs Lab 08/20/17 2255  LIPASE 22   No results for input(s): AMMONIA in the last 168 hours. Coagulation Profile:  Recent Labs Lab 08/20/17 2255  INR 1.16   Cardiac Enzymes: No results for input(s): CKTOTAL, CKMB, CKMBINDEX, TROPONINI in the last 168 hours. BNP (last 3 results) No results for input(s): PROBNP in the last 8760 hours. HbA1C: No results for input(s): HGBA1C in the last 72 hours. CBG: No results for input(s): GLUCAP in the last 168 hours. Lipid Profile: No results for input(s): CHOL, HDL, LDLCALC, TRIG, CHOLHDL, LDLDIRECT in the last 72 hours. Thyroid Function Tests: No results for input(s): TSH, T4TOTAL, FREET4, T3FREE, THYROIDAB in the last 72 hours. Anemia Panel:  Recent Labs  08/21/17 0036  VITAMINB12 767  FOLATE 9.7  FERRITIN 745*  TIBC 214*  IRON 29  RETICCTPCT 3.9*   Sepsis Labs:  Recent Labs Lab 08/20/17 1941 08/20/17 2251 08/20/17 2255 08/20/17 2306 08/21/17 0233  PROCALCITON  --   --  1.20  --   --   LATICACIDVEN 2.70* 2.4*  --  2.32* 2.2*    No results found for this or any previous visit (from the past 240 hour(s)).       Radiology Studies: Ct Abdomen Pelvis  Wo Contrast  Result Date: 08/20/2017 CLINICAL DATA:  Abdominal pain EXAM: CT ABDOMEN AND PELVIS WITHOUT CONTRAST TECHNIQUE: Multidetector CT imaging of the abdomen and pelvis was performed following the standard protocol without IV contrast. COMPARISON:  MRI 02/10/2010 FINDINGS: Lower chest: Lung bases demonstrate no acute consolidation or pleural effusion. The heart size is within normal limits. Hepatobiliary: Vague hypodense mass with central calcification in the right hepatic lobe measuring at least 8.5 cm and presumably corresponding to the 2011 demonstrated MRI mass/ thought than to represent hemangioma. No calcified gallstones. No biliary  dilatation Pancreas: Unremarkable. No pancreatic ductal dilatation or surrounding inflammatory changes. Spleen: Enlarged, measuring 14 cm Adrenals/Urinary Tract: Left adrenal gland is within normal limits. Possible cyst in the mid to lower pole left kidney. Severe hydronephrosis and hydroureter of the right kidney with distal tapering of the ureter. Bladder within normal limits. No stones are seen Stomach/Bowel: The stomach is nonenlarged. There is no dilated small bowel. No colon wall thickening. Vascular/Lymphatic: Aortic atherosclerosis. Multiple enlarged periaortic/retroperitoneal lymph nodes, measuring up to 2 cm. Right greater than left iliac adenopathy. Reproductive: Calcified uterine fibroids.  No adnexal masses. Other: Negative for free air or free fluid. Musculoskeletal: Degenerative changes. No acute or suspicious bone lesion IMPRESSION: 1. Vague hypodense mass with central calcification in the right hepatic lobe presumably corresponding to the MRI mass from 2011 which was thought to represent hemangioma 2. Moderate retroperitoneal somewhat bulky adenopathy. Splenomegaly. Findings raise concern for metastatic disease or lymphoproliferative abnormality such as lymphoma. Clinical correlation is recommended 3. Negative for a bowel obstruction or colon wall thickening  4. Severe right hydronephrosis and hydroureter with distal tapering of the ureter. No definitive stones are seen, findings could be secondary to a stricture 5. Calcified uterine fibroids Electronically Signed   By: Donavan Foil M.D.   On: 08/20/2017 23:55   Dg Chest 2 View  Result Date: 08/20/2017 CLINICAL DATA:  Weakness nausea and vomiting EXAM: CHEST  2 VIEW COMPARISON:  None. FINDINGS: The heart size and mediastinal contours are within normal limits. Both lungs are clear. Degenerative changes of the spine. IMPRESSION: No active cardiopulmonary disease. Electronically Signed   By: Donavan Foil M.D.   On: 08/20/2017 19:42        Scheduled Meds: . allopurinol  300 mg Oral Daily  . dextromethorphan-guaiFENesin  1 tablet Oral BID  . methotrexate  2.5 mg Oral Daily  . simvastatin  20 mg Oral Daily   Continuous Infusions: . sodium chloride 125 mL/hr at 08/21/17 0012  . cefTRIAXone (ROCEPHIN)  IV       LOS: 1 day    Time spent: [place], MD [facility] Pager [phone] If 7PM-7AM, please contact night-coverage [email or web address] Password [email or web address] 08/21/2017, 11:38 AM

## 2017-08-21 NOTE — Progress Notes (Signed)
Patient given 250 ml bolus.

## 2017-08-21 NOTE — Consult Note (Signed)
Chief Complaint: Patient was seen in consultation today for right percutaneous nephrostomy placement Chief Complaint  Patient presents with  . Abnormal Lab   at the request of Dr Alyson Ingles  Referring Physician(s): [place]  Supervising Physician: Marybelle Killings  Patient Status: [facility] - In-pt  History of Present Illness: Kristina Torres is a 71 y.o. female   Hx HTN; HLD; Rh  Arthritis; renal mass N/V RUQ pain x 1 week [facility] saw pt last week---dx renal mass Scheduled to see Urology Dr Karsten Ro this week Came to ED with N/V and fever  CT yesterday: IMPRESSION: 1. Vague hypodense mass with central calcification in the right hepatic lobe presumably corresponding to the MRI mass from 2011 which was thought to represent hemangioma 2. Moderate retroperitoneal somewhat bulky adenopathy. Splenomegaly. Findings raise concern for metastatic disease or lymphoproliferative abnormality such as lymphoma. Clinical correlation is recommended 3. Negative for a bowel obstruction or colon wall thickening 4. Severe right hydronephrosis and hydroureter with distal tapering of the ureter. No definitive stones are seen, findings could be secondary to a stricture 5. Calcified uterine fibroids  New liver mass; UTI; thrombocytopenia Rt hydronephrosis  Dr Alyson Ingles consulted and requesting Percutaneous nephrostomy placement Dr Barbie Banner has reviewed imaging and approves procedure  Past Medical History:  Diagnosis Date  . Essential hypertension   . Gout   . HLD (hyperlipidemia)   . Rheumatoid arthritis [facility])     Past Surgical History:  Procedure Laterality Date  . APPENDECTOMY    . CARPAL TUNNEL RELEASE Right   . LAPAROSCOPIC SALPINGO OOPHERECTOMY Right     Allergies: Streptomycin  Medications: Prior to Admission medications   Medication Sig Start Date End Date Taking? Authorizing Provider  allopurinol (ZYLOPRIM) 300 MG tablet Take 300 mg by mouth daily. 05/19/17  Yes [provider]  Calcium Carb-Cholecalciferol (CALCIUM 1000 + D PO) Take 1 tablet by mouth daily.   Yes [provider]  ciprofloxacin (CIPRO) 250 MG tablet Take 250 mg by mouth 2 (two) times daily. 08/15/17  Yes [provider]  hydrochlorothiazide (MICROZIDE) 12.5 MG capsule Take 12.5 mg by mouth daily.  07/19/17  Yes [provider]  methotrexate (RHEUMATREX) 2.5 MG tablet Take 2.5 mg by mouth daily. 05/20/17  Yes [provider]  ondansetron (ZOFRAN-ODT) 4 MG disintegrating tablet Take 4 mg by mouth as needed. 08/14/17  Yes [provider]  simvastatin (ZOCOR) 20 MG tablet Take 20 mg by mouth daily. 08/17/17  Yes [provider]     Family History  Problem Relation Age of Onset  . CVA Mother     Social History   Social History  . Marital status: Married    Spouse name: N/A  . Number of children: N/A  . Years of education: N/A   Social History Main Topics  . Smoking status: Former Smoker    Quit date: 08/06/2017  . Smokeless tobacco: Never Used  . Alcohol use No  . Drug use: No  . Sexual activity: Not Asked   Other Topics Concern  . None   Social History Narrative  . None    Review of Systems: A 12 point ROS discussed and pertinent positives are indicated in the HPI above.  All other systems are negative.  Review of Systems  Constitutional: Positive for activity change, fatigue and fever.  Respiratory: Positive for cough and shortness of breath.   Gastrointestinal: Positive for nausea and vomiting.  Neurological: Positive for weakness.  Psychiatric/Behavioral: Negative for behavioral problems  and confusion.    Vital Signs: BP (!) 81/60 (BP Location: Right Arm)   Pulse 92   Temp 98.1 F (36.7 C) (Oral)   Resp 18   Ht 4' 10.5" (1.486 m)   Wt 181 lb 4.8 oz (82.2 kg)   SpO2 90%   BMI 37.25 kg/m   Physical Exam  Constitutional: She is oriented to person, place, and time.  Cardiovascular: Normal rate and regular  rhythm.   Pulmonary/Chest: Effort normal and breath sounds normal.  Abdominal: Soft. Bowel sounds are normal. There is tenderness.  Musculoskeletal: Normal range of motion.  Neurological: She is alert and oriented to person, place, and time.  Skin: Skin is warm and dry.  Psychiatric: She has a normal mood and affect. Her behavior is normal. Judgment and thought content normal.  Nursing note and vitals reviewed.   Mallampati Score:  MD Evaluation Airway: WNL Heart: WNL Abdomen: WNL Chest/ Lungs: WNL ASA  Classification: 2 Mallampati/Airway Score: One  Imaging: Ct Abdomen Pelvis Wo Contrast  Result Date: 08/20/2017 CLINICAL DATA:  Abdominal pain EXAM: CT ABDOMEN AND PELVIS WITHOUT CONTRAST TECHNIQUE: Multidetector CT imaging of the abdomen and pelvis was performed following the standard protocol without IV contrast. COMPARISON:  MRI 02/10/2010 FINDINGS: Lower chest: Lung bases demonstrate no acute consolidation or pleural effusion. The heart size is within normal limits. Hepatobiliary: Vague hypodense mass with central calcification in the right hepatic lobe measuring at least 8.5 cm and presumably corresponding to the 2011 demonstrated MRI mass/ thought than to represent hemangioma. No calcified gallstones. No biliary dilatation Pancreas: Unremarkable. No pancreatic ductal dilatation or surrounding inflammatory changes. Spleen: Enlarged, measuring 14 cm Adrenals/Urinary Tract: Left adrenal gland is within normal limits. Possible cyst in the mid to lower pole left kidney. Severe hydronephrosis and hydroureter of the right kidney with distal tapering of the ureter. Bladder within normal limits. No stones are seen Stomach/Bowel: The stomach is nonenlarged. There is no dilated small bowel. No colon wall thickening. Vascular/Lymphatic: Aortic atherosclerosis. Multiple enlarged periaortic/retroperitoneal lymph nodes, measuring up to 2 cm. Right greater than left iliac adenopathy. Reproductive:  Calcified uterine fibroids.  No adnexal masses. Other: Negative for free air or free fluid. Musculoskeletal: Degenerative changes. No acute or suspicious bone lesion IMPRESSION: 1. Vague hypodense mass with central calcification in the right hepatic lobe presumably corresponding to the MRI mass from 2011 which was thought to represent hemangioma 2. Moderate retroperitoneal somewhat bulky adenopathy. Splenomegaly. Findings raise concern for metastatic disease or lymphoproliferative abnormality such as lymphoma. Clinical correlation is recommended 3. Negative for a bowel obstruction or colon wall thickening 4. Severe right hydronephrosis and hydroureter with distal tapering of the ureter. No definitive stones are seen, findings could be secondary to a stricture 5. Calcified uterine fibroids Electronically Signed   By: Donavan Foil M.D.   On: 08/20/2017 23:55   Dg Chest 2 View  Result Date: 08/20/2017 CLINICAL DATA:  Weakness nausea and vomiting EXAM: CHEST  2 VIEW COMPARISON:  None. FINDINGS: The heart size and mediastinal contours are within normal limits. Both lungs are clear. Degenerative changes of the spine. IMPRESSION: No active cardiopulmonary disease. Electronically Signed   By: Donavan Foil M.D.   On: 08/20/2017 19:42    Labs:  CBC:  Recent Labs  08/20/17 1754 08/20/17 1933 08/21/17 0036 08/21/17 0233  WBC 19.0*  --  15.1* 15.9*  HGB 10.3* 9.5* 8.0* 9.1*  HCT 31.8* 28.0* 24.2* 27.9*  PLT 123*  --  100* 111*  COAGS:  Recent Labs  08/20/17 2255  INR 1.16  APTT 24    BMP:  Recent Labs  08/20/17 1754 08/20/17 1933 08/21/17 0036 08/21/17 0832  NA 132* 131* 132*  133* 135  K 3.9 3.7 3.5  3.5 3.3*  CL 92* 91* 96*  96* 100*  CO2 29  --  27  29 23   GLUCOSE 113* 110* 117*  122* 85  BUN 27* 31* 28*  28* 28*  CALCIUM 13.7*  --  12.2*  12.3* 12.8*  CREATININE 2.08* 2.20* 1.75*  1.74* 1.56*  GFRNONAA 23*  --  28*  28* 32*  GFRAA 26*  --  33*  33* 37*    LIVER  FUNCTION TESTS:  Recent Labs  08/20/17 1931 08/21/17 0832  BILITOT 2.0*  --   AST 29  --   ALT 32  --   ALKPHOS 90  --   PROT 5.2*  --   ALBUMIN 2.8* 2.0*    TUMOR MARKERS: No results for input(s): AFPTM, CEA, CA199, CHROMGRNA in the last 8760 hours.  Assessment and Plan:  R hydronephrosis New liver mass; LAN Scheduled for Right percutaneous nephrostomy placement Risks and benefits discussed with the patient including, but not limited to infection, bleeding, significant bleeding causing loss or decrease in renal function or damage to adjacent structures.  All of the patient's questions were answered, patient is agreeable to proceed. Consent signed and in chart.   Thank you for this interesting consult.  I greatly enjoyed meeting [facility] and look forward to participating in their care.  A copy of this report was sent to the requesting provider on this date.  Electronically Signed: Dagan Heinz A, PA-C 08/21/2017, 10:14 AM   I spent a total of 40 Minutes    in face to face in clinical consultation, greater than 50% of which was counseling/coordinating care for R PCN

## 2017-08-22 LAB — CBC
HEMATOCRIT: 24.7 % — AB (ref 36.0–46.0)
Hemoglobin: 8 g/dL — ABNORMAL LOW (ref 12.0–15.0)
MCH: 33.1 pg (ref 26.0–34.0)
MCHC: 32.4 g/dL (ref 30.0–36.0)
MCV: 102.1 fL — AB (ref 78.0–100.0)
PLATELETS: 130 10*3/uL — AB (ref 150–400)
RBC: 2.42 MIL/uL — ABNORMAL LOW (ref 3.87–5.11)
RDW: 17.5 % — AB (ref 11.5–15.5)
WBC: 14 10*3/uL — ABNORMAL HIGH (ref 4.0–10.5)

## 2017-08-22 LAB — URINE CULTURE
CULTURE: NO GROWTH
Culture: NO GROWTH

## 2017-08-22 LAB — BASIC METABOLIC PANEL
Anion gap: 6 (ref 5–15)
BUN: 27 mg/dL — AB (ref 6–20)
CHLORIDE: 105 mmol/L (ref 101–111)
CO2: 26 mmol/L (ref 22–32)
CREATININE: 1.34 mg/dL — AB (ref 0.44–1.00)
Calcium: 13.3 mg/dL (ref 8.9–10.3)
GFR calc Af Amer: 45 mL/min — ABNORMAL LOW (ref >60)
GFR calc non Af Amer: 39 mL/min — ABNORMAL LOW (ref >60)
Glucose, Bld: 85 mg/dL (ref 65–99)
POTASSIUM: 3.8 mmol/L (ref 3.5–5.1)
Sodium: 137 mmol/L (ref 135–145)

## 2017-08-22 LAB — UREA NITROGEN, URINE: Urea Nitrogen, Ur: 364 mg/dL

## 2017-08-22 LAB — PARATHYROID HORMONE, INTACT (NO CA): PTH: 16 pg/mL (ref 15–65)

## 2017-08-22 LAB — GLUCOSE, CAPILLARY: GLUCOSE-CAPILLARY: 84 mg/dL (ref 65–99)

## 2017-08-22 LAB — VITAMIN D 25 HYDROXY (VIT D DEFICIENCY, FRACTURES): VIT D 25 HYDROXY: 30.2 ng/mL (ref 30.0–100.0)

## 2017-08-22 LAB — PATHOLOGIST SMEAR REVIEW

## 2017-08-22 LAB — CALCIUM, IONIZED: Calcium, Ionized, Serum: 7.9 mg/dL — ABNORMAL HIGH (ref 4.5–5.6)

## 2017-08-22 MED ORDER — CALCITONIN (SALMON) 200 UNIT/ACT NA SOLN
1.0000 | Freq: Every day | NASAL | Status: DC
Start: 2017-08-22 — End: 2017-08-23
  Administered 2017-08-22 – 2017-08-23 (×2): 1 via NASAL
  Filled 2017-08-22: qty 3.7

## 2017-08-22 MED ORDER — SODIUM CHLORIDE 0.9 % IV SOLN
60.0000 mg | Freq: Once | INTRAVENOUS | Status: AC
  Administered 2017-08-22: 60 mg via INTRAVENOUS
  Filled 2017-08-22: qty 20

## 2017-08-22 MED ORDER — SODIUM CHLORIDE 0.9 % IV SOLN: INTRAVENOUS | Status: DC

## 2017-08-22 NOTE — Evaluation (Signed)
Physical Therapy Evaluation Patient Details Name: Deserie Dirks MRN: [id number] DOB: 10-18-1946 Today's Date: 08/22/2017   History of Present Illness  71 year old married female, PMH of HTN, HLD, gout, rheumatoid arthritis and renal mass presented to ED on 08/20/17 with complaints of nausea, nonbloody emesis, RUQ abdominal pain, lightheadedness, generalized weakness and cough.  Found to have UTI with renal blockage due to mass.  Urology consulted. IR placed right percutaneous nephrostomy.  Clinical Impression  Patient presents with decreased independence with mobility due to weakness, decreased cognition and arousal, decreased balance, decreased activity tolerance and will benefit from skilled PT during acute stay.  Spouse present and reports pt with increased lethargy later in the day.  Currently feel she would need SNF level rehab at d/c unless able to demonstrate improved mobility when more alert and able to participate.  Will follow.     Follow Up Recommendations Supervision/Assistance - 24 hour;SNF    Equipment Recommendations  Rolling walker with 5" wheels    Recommendations for Other Services       Precautions / Restrictions        Mobility  Bed Mobility Overal bed mobility: Needs Assistance Bed Mobility: Rolling;Sidelying to Sit;Sit to Supine Rolling: Mod assist Sidelying to sit: Mod assist   Sit to supine: Mod assist   General bed mobility comments: cues, increased time and reaching for rail with assist, assisted legs off bed and trunk upright, then assist for legs into bed  Transfers Overall transfer level: Needs assistance Equipment used: 1 person hand held assist Transfers: Sit to/from Stand Sit to Stand: Max assist         General transfer comment: lifting and lowering assist from EOB, mod A to steady in standing at EOB  Ambulation/Gait             General Gait Details: deferred due to pt unsteady, lethargic and no walker in room  Stairs             Wheelchair Mobility    Modified Rankin (Stroke Patients Only)       Balance Overall balance assessment: Needs assistance   Sitting balance-Leahy Scale: Poor Sitting balance - Comments: leaning to L initially, then anteriorly at EOB with lethargy     Standing balance-Leahy Scale: Poor Standing balance comment: mod support in standing without walker                             Pertinent Vitals/Pain Pain Assessment: No/denies pain    Home Living Family/patient expects to be discharged to:: Private residence Living Arrangements: Spouse/significant other Available Help at Discharge: Family Type of Home: House Home Access: Stairs to enter Entrance Stairs-Rails: Right Entrance Stairs-Number of Steps: 4 Home Layout: One level;Laundry or work area in [employer]: None      Prior Function Level of Independence: Independent               Journalist, newspaper        Extremity/Trunk Assessment   Upper Extremity Assessment Upper Extremity Assessment: Generalized weakness    Lower Extremity Assessment Lower Extremity Assessment: Generalized weakness       Communication   Communication: No difficulties  Cognition Arousal/Alertness: Lethargic Behavior During Therapy: Flat affect Overall Cognitive Status: Impaired/Different from baseline Area of Impairment: Orientation;Attention;Following commands;Problem solving                 Orientation Level: Disoriented to;Place;Time Current Attention Level: Sustained   Following  Commands: Follows one step commands inconsistently     Problem Solving: Slow processing;Decreased initiation;Requires verbal cues;Requires tactile cues General Comments: spouse states pt more lethargic as day goes on      General Comments General comments (skin integrity, edema, etc.): redness on bottom after purewick removal and cleansing due to fecal incontinence, RN informed    Exercises     Assessment/Plan     PT Assessment Patient needs continued PT services  PT Problem List Decreased strength;Decreased activity tolerance;Decreased balance;Decreased knowledge of use of DME;Decreased cognition;Decreased mobility;Decreased safety awareness       PT Treatment Interventions DME instruction;Gait training;Therapeutic exercise;Patient/family education;Therapeutic activities;Balance training;Functional mobility training;Stair training    PT Goals (Current goals can be found in the Care Plan section)  Acute Rehab PT Goals Patient Stated Goal: To return to independent PT Goal Formulation: With patient/family Time For Goal Achievement: 09/05/17 Potential to Achieve Goals: Good    Frequency Min 3X/week   Barriers to discharge        Co-evaluation               AM-PAC PT "6 Clicks" Daily Activity  Outcome Measure Difficulty turning over in bed (including adjusting bedclothes, sheets and blankets)?: Unable Difficulty moving from lying on back to sitting on the side of the bed? : Unable Difficulty sitting down on and standing up from a chair with arms (e.g., wheelchair, bedside commode, etc,.)?: Unable Help needed moving to and from a bed to chair (including a wheelchair)?: A Lot Help needed walking in hospital room?: Total Help needed climbing 3-5 steps with a railing? : Total 6 Click Score: 7    End of Session Equipment Utilized During Treatment: Gait belt Activity Tolerance: Patient limited by lethargy Patient left: in bed;with call bell/phone within reach;with family/visitor present   PT Visit Diagnosis: Other abnormalities of gait and mobility (R26.89);Unsteadiness on feet (R26.81);Muscle weakness (generalized) (M62.81)    Time: 2706-2376 PT Time Calculation (min) (ACUTE ONLY): 23 min   Charges:   PT Evaluation $PT Eval Moderate Complexity: 1 Mod PT Treatments $Therapeutic Activity: 8-22 mins   PT G CodesMagda Kiel, [place] [phone] 08/22/2017   Reginia Naas 08/22/2017, 5:34 PM

## 2017-08-22 NOTE — Progress Notes (Addendum)
PROGRESS NOTE   Kristina Torres  ZOX:[id number]    DOB: 1946/02/20    DOA: 08/20/2017  PCP: [name], MD   I have briefly reviewed patients previous medical records in [facility].  Brief Narrative:  71 year old married female, PMH of HTN, HLD, gout, rheumatoid arthritis and renal mass presented to ED on 08/20/17 with complaints of nausea, nonbloody emesis, RUQ abdominal pain, lightheadedness, generalized weakness and cough. She was seen at [facility] on Tuesday prior to admission and found to have "right renal mass on ultrasound with blockage", started on oral ciprofloxacin for UTI and was supposed to see Urologist on Thursday with a CT scan. In the ED, transiently hypotensive, noted to have acute kidney injury with creatinine of 2.08, hypercalcemia with calcium of 13.87, oxygen saturation 90% on room air and CT abdomen demonstrated right hepatic lobe with hypodense mass, moderate retroperitoneal bulky lymphadenopathy, splenomegaly, severe right hydronephrosis and hydroureter. Urology consulted. IR placed right percutaneous nephrostomy. Oncology consulted and recommended retroperitoneal lymph node biopsy by IR, scheduled for 9/6. Improving.  Assessment & Plan:   Principal Problem:   Nausea & vomiting Active Problems:   Rheumatoid arthritis ([place])   Essential hypertension   Gout   UTI (urinary tract infection)   Sepsis (HCC)   HLD (hyperlipidemia)   Hypotension   AKI (acute kidney injury) ([place])   Hypercalcemia   Thrombocytopenia (HCC)   Cough   Macrocytic anemia   Lymphadenopathy   1. Severe right hydronephrosis and hydroureter: Urology consulted 9/4 and recommended nephrostomy tube placement by IR which was done same day. Patient had been empirically started on IV ceftriaxone but urine culture from 9/3 and from drain on 9/4 are negative and hence ceftriaxone discontinued. Await urology input regarding further management. 2. Hypotension: Likely related to dehydration  from poor oral intake and GI losses. Ongoing soft blood pressures. Continue IV fluids. Although had SIRS criteria on admission, not likely from infectious source. Blood cultures 2: Negative to date. 3. Nausea and vomiting: Secondary to obstructive uropathy and acute kidney injury. Resolved. 4. Hypercalcemia: Patient liberally takes Tums at home for unclear reasons. Despite IV hydration and improvement in her creatinine, calcium 13.3. As per oncology recommendations, will give her dose of pamidronate and also add calcitonin. Holding HCTZ, calcium and vitamin D supplements. Follow BMP. 5. Anemia: Stable. Closely follow CBCs and transfuse if hemoglobin <7. As per oncology, likely related to methotrexate and anemia of chronic disease. 6. Thrombocytopenia: Stable without bleeding. As per oncology, likely related to methotrexate or autoimmune mediated. 7. Hypokalemia: Replaced: 8. Elevated lactate: Likely due to dehydration and acute kidney injury. Improved. 9. Acute kidney injury: As per admitting physician, creatinine was 0.93 on 08/13/17. Presented with creatinine of 2.08. Secondary to obstructive uropathy and dehydration. Creatinine has improved to 1.34. Continue IV fluids and follow BMP in a.m. 10. Dry cough: Apparently chronic and patient smokes. Chest x-ray negative. Stable. 11. Essential hypertension: HCTZ held. Ongoing soft blood pressures but asymptomatic. 12. Gout: No acute flare. Continue allopurinol. 13. Hyperlipidemia: Statins. 14. Liver mass: CT abdomen showed awake hypodense mass with central calcification in the right hepatic lobe presumably corresponding to the MRI mass from 2011 which was thought to represent hemangioma. CT also showed moderate retroperitoneal somewhat bulky lymphadenopathy and splenomegaly raising concerns for metastatic disease or lymphoproliferative abnormality such as lymphoma. Oncology consulted 9/4 and indicate that the liver lesion is not suspicious for malignancy.  Oncology didn't recommend CT-guided retroperitoneal lymph node biopsy which will be done by IR  on 9/6 and a CT chest without contrast to rule out malignancy (ordered). 15. Rheumatoid arthritis: Methotrexate.  16. Urethral/bladder mass: As per urology consultation, outpatient cystoscopy 17. Somnolence/confusion: Likely related to acute kidney injury and hypercalcemia. Improved. Avoid sedating medications. 53. Fall: As per RN, sustained fall yesterday without injuries. PT evaluation. Fall precautions.   DVT prophylaxis: SCDs Code Status: Full Family Communication: Discussed in detail with patient's sister and brother-in-law at bedside. Disposition: To be determined pending clinical improvement and PT evaluation.   Consultants:  Urology Medical oncology Interventional radiology   Procedures:  Right percutaneous nephrostomy  Antimicrobials:  IV Rocephin-discontinued 9/5    Subjective: Seen this morning. Family at bedside. Reports feeling generally weak. As per RN, sustained a fall on 9/4 without sustaining injuries. As per sister, MS improved and much alert and coherant.   ROS: No pain, nausea or vomiting  Objective:  Vitals:   08/22/17 0552 08/22/17 0853 08/22/17 1245 08/22/17 1246  BP: (!) 85/63 (!) 98/59 92/61 (!) 109/43  Pulse: 84 80 78 73  Resp: 18 18 18 18   Temp: 98.5 F (36.9 C) 98.2 F (36.8 C)    TempSrc: Oral Oral    SpO2: 97% 95% 97% 97%  Weight:      Height:        Examination:  General exam: Pleasant elderly female, moderately built and overweight, lying comfortably propped up in bed. Oral mucosa slightly dry. Respiratory system: Clear to auscultation. Respiratory effort normal. Cardiovascular system: S1 & S2 heard, RRR. No JVD, murmurs, rubs, gallops or clicks. No pedal edema. Telemetry: Sinus rhythm with a few PVCs. Gastrointestinal system: Abdomen is nondistended, soft and nontender. No organomegaly or masses felt. Normal bowel sounds heard. Right  percutaneous nephrostomy +. Central nervous system: Alert and oriented. No focal neurological deficits. Extremities: Symmetric 5 x 5 power. Skin: No rashes, lesions or ulcers Psychiatry: Judgement and insight appear normal. Mood & affect flat.     Data Reviewed: I have personally reviewed following labs and imaging studies  CBC:  Recent Labs Lab 08/20/17 1754 08/20/17 1933 08/21/17 0036 08/21/17 0233 08/22/17 1051  WBC 19.0*  --  15.1* 15.9* 14.0*  NEUTROABS  --   --  7.3  --   --   HGB 10.3* 9.5* 8.0* 9.1* 8.0*  HCT 31.8* 28.0* 24.2* 27.9* 24.7*  MCV 101.6*  --  100.8* 100.7* 102.1*  PLT 123*  --  100* 111* 025*   Basic Metabolic Panel:  Recent Labs Lab 08/20/17 1754 08/20/17 1933 08/21/17 0036 08/21/17 0832 08/22/17 1051  NA 132* 131* 132*  133* 135 137  K 3.9 3.7 3.5  3.5 3.3* 3.8  CL 92* 91* 96*  96* 100* 105  CO2 29  --  27  29 23 26   GLUCOSE 113* 110* 117*  122* 85 85  BUN 27* 31* 28*  28* 28* 27*  CREATININE 2.08* 2.20* 1.75*  1.74* 1.56* 1.34*  CALCIUM 13.7*  --  12.2*  12.3* 12.8* 13.3*   Liver Function Tests:  Recent Labs Lab 08/20/17 1931 08/21/17 0832  AST 29  --   ALT 32  --   ALKPHOS 90  --   BILITOT 2.0*  --   PROT 5.2*  --   ALBUMIN 2.8* 2.0*   Coagulation Profile:  Recent Labs Lab 08/20/17 2255  INR 1.16   Cardiac Enzymes: No results for input(s): CKTOTAL, CKMB, CKMBINDEX, TROPONINI in the last 168 hours. HbA1C: No results for input(s): HGBA1C in the last 72  hours. CBG:  Recent Labs Lab 08/21/17 1149 08/22/17 0759  GLUCAP 79 84    Recent Results (from the past 240 hour(s))  Blood Culture (routine x 2)     Status: None (Preliminary result)   Collection Time: 08/20/17  8:10 PM  Result Value Ref Range Status   Specimen Description BLOOD RIGHT HAND  Final   Special Requests IN PEDIATRIC BOTTLE Blood Culture adequate volume  Final   Culture NO GROWTH 2 DAYS  Final   Report Status PENDING  Incomplete  Blood  Culture (routine x 2)     Status: None (Preliminary result)   Collection Time: 08/20/17  8:13 PM  Result Value Ref Range Status   Specimen Description BLOOD RIGHT ANTECUBITAL  Final   Special Requests   Final    BOTTLES DRAWN AEROBIC AND ANAEROBIC Blood Culture adequate volume   Culture NO GROWTH 2 DAYS  Final   Report Status PENDING  Incomplete  Urine Culture     Status: None   Collection Time: 08/20/17  9:15 PM  Result Value Ref Range Status   Specimen Description URINE, CATHETERIZED  Final   Special Requests NONE  Final   Culture NO GROWTH  Final   Report Status 08/22/2017 FINAL  Final  Urine Culture     Status: None   Collection Time: 08/21/17  2:32 PM  Result Value Ref Range Status   Specimen Description URINE, CATHETERIZED  URINE FROM DRAIN PLACEMENT  Final   Special Requests NONE  Final   Culture NO GROWTH  Final   Report Status 08/22/2017 FINAL  Final         Radiology Studies: Ct Abdomen Pelvis Wo Contrast  Result Date: 08/20/2017 CLINICAL DATA:  Abdominal pain EXAM: CT ABDOMEN AND PELVIS WITHOUT CONTRAST TECHNIQUE: Multidetector CT imaging of the abdomen and pelvis was performed following the standard protocol without IV contrast. COMPARISON:  MRI 02/10/2010 FINDINGS: Lower chest: Lung bases demonstrate no acute consolidation or pleural effusion. The heart size is within normal limits. Hepatobiliary: Vague hypodense mass with central calcification in the right hepatic lobe measuring at least 8.5 cm and presumably corresponding to the 2011 demonstrated MRI mass/ thought than to represent hemangioma. No calcified gallstones. No biliary dilatation Pancreas: Unremarkable. No pancreatic ductal dilatation or surrounding inflammatory changes. Spleen: Enlarged, measuring 14 cm Adrenals/Urinary Tract: Left adrenal gland is within normal limits. Possible cyst in the mid to lower pole left kidney. Severe hydronephrosis and hydroureter of the right kidney with distal tapering of the  ureter. Bladder within normal limits. No stones are seen Stomach/Bowel: The stomach is nonenlarged. There is no dilated small bowel. No colon wall thickening. Vascular/Lymphatic: Aortic atherosclerosis. Multiple enlarged periaortic/retroperitoneal lymph nodes, measuring up to 2 cm. Right greater than left iliac adenopathy. Reproductive: Calcified uterine fibroids.  No adnexal masses. Other: Negative for free air or free fluid. Musculoskeletal: Degenerative changes. No acute or suspicious bone lesion IMPRESSION: 1. Vague hypodense mass with central calcification in the right hepatic lobe presumably corresponding to the MRI mass from 2011 which was thought to represent hemangioma 2. Moderate retroperitoneal somewhat bulky adenopathy. Splenomegaly. Findings raise concern for metastatic disease or lymphoproliferative abnormality such as lymphoma. Clinical correlation is recommended 3. Negative for a bowel obstruction or colon wall thickening 4. Severe right hydronephrosis and hydroureter with distal tapering of the ureter. No definitive stones are seen, findings could be secondary to a stricture 5. Calcified uterine fibroids Electronically Signed   By: Donavan Foil M.D.   On: 08/20/2017  23:55   Dg Chest 2 View  Result Date: 08/20/2017 CLINICAL DATA:  Weakness nausea and vomiting EXAM: CHEST  2 VIEW COMPARISON:  None. FINDINGS: The heart size and mediastinal contours are within normal limits. Both lungs are clear. Degenerative changes of the spine. IMPRESSION: No active cardiopulmonary disease. Electronically Signed   By: Donavan Foil M.D.   On: 08/20/2017 19:42   Ir Nephrostomy Placement Right  Result Date: 08/21/2017 INDICATION: Right ureteral obstruction EXAM: PERCUTANEOUS RIGHT NEPHROSTOMY PLACEMENT COMPARISON:  None. MEDICATIONS: Ciprofloxacin 400 mg IV; The antibiotic was administered in an appropriate time frame prior to skin puncture. ANESTHESIA/SEDATION: Fentanyl 50 mcg IV; Versed 1 mg IV Moderate  Sedation Time:  18 The patient was continuously monitored during the procedure by the interventional radiology nurse under my direct supervision. CONTRAST:  10 cc Isovue-300 - administered into the collecting system(s) FLUOROSCOPY TIME:  Fluoroscopy Time: 1 minutes  seconds (3 mGy). COMPLICATIONS: None immediate. PROCEDURE: Informed written consent was obtained from the patient after a thorough discussion of the procedural risks, benefits and alternatives. All questions were addressed. Maximal Sterile Barrier Technique was utilized including caps, mask, sterile gowns, sterile gloves, sterile drape, hand hygiene and skin antiseptic. A timeout was performed prior to the initiation of the procedure. The back was prepped and draped in a sterile fashion. Under sonographic guidance, a 21 gauge needle was inserted into a posterior lower pole calyx and removed over a 018 wire. This was up sized to an Amplatz. Ten [place] dilator followed by a 10 [place] drain were inserted. It was looped and string fixed in the renal pelvis. Contrast was injected. A sample of urine was sent for culture. FINDINGS: Imaging documents placement of a 10 French right percutaneous nephrostomy catheter into the renal pelvis. IMPRESSION: Successful right percutaneous nephrostomy catheter placement. Electronically Signed   By: Marybelle Killings M.D.   On: 08/21/2017 14:48        Scheduled Meds: . allopurinol  300 mg Oral Daily  . dextromethorphan-guaiFENesin  1 tablet Oral BID  . [START ON 08/27/2017] methotrexate  17.5 mg Oral Weekly  . simvastatin  20 mg Oral Daily   Continuous Infusions: . sodium chloride 125 mL/hr at 08/22/17 0005  . cefTRIAXone (ROCEPHIN)  IV Stopped (08/21/17 2351)     LOS: 2 days     Jeilani Grupe, MD, FACP, FHM. [facility] Pager [phone]  If 7PM-7AM, please contact night-coverage [email or web address] Password [facility] 08/22/2017, 4:35 PM

## 2017-08-22 NOTE — Care Management Note (Signed)
Case Management Note  Patient Details  Name: Kristina Torres MRN: [id number] Date of Birth: 04-03-1946  Subjective/Objective:                 Patient from home w spouse. Admitted for NV/D, UTI, on Rocephin. Liver mass in right hepatic lobe, RP adenopathy, rule out malignancy. Nephrostomy tube placed by IR yesterday. Will have lymph node biopsy tomorrow.    Action/Plan:  CM will continue to follow.  Expected Discharge Date:  08/24/17               Expected Discharge Plan:  [place]  In-House Referral:     Discharge planning Services  CM Consult  Post Acute Care Choice:    Choice offered to:     DME Arranged:    DME Agency:     HH Arranged:    HH Agency:     Status of Service:  In process, will continue to follow  If discussed at Long Length of Stay Meetings, dates discussed:    Additional Comments:  Carles Collet, RN 08/22/2017, 2:34 PM

## 2017-08-22 NOTE — Progress Notes (Signed)
Patient ID: Kristina Torres, female   DOB: Apr 18, 1946, 71 y.o.   MRN: [id number]    Referring Physician(s): Dr. Nicolette Bang  Supervising Physician: Sandi Mariscal  Patient Status: [facility] - In-pt  Chief Complaint: right PCN   Subjective: Patient is more awake and alert today.  She has no complaints.    Allergies: Streptomycin  Medications: Prior to Admission medications   Medication Sig Start Date End Date Taking? Authorizing Provider  allopurinol (ZYLOPRIM) 300 MG tablet Take 300 mg by mouth daily. 05/19/17  Yes [provider]  Calcium Carb-Cholecalciferol (CALCIUM 1000 + D PO) Take 1 tablet by mouth daily.   Yes [provider]  ciprofloxacin (CIPRO) 250 MG tablet Take 250 mg by mouth 2 (two) times daily. 08/15/17  Yes [provider]  hydrochlorothiazide (MICROZIDE) 12.5 MG capsule Take 12.5 mg by mouth daily.  07/19/17  Yes [provider]  methotrexate (RHEUMATREX) 2.5 MG tablet Take 17.5 mg by mouth once a week. Take 7 tabs (2.5mg  tablets) qMonday 05/20/17  Yes [provider]  ondansetron (ZOFRAN-ODT) 4 MG disintegrating tablet Take 4 mg by mouth as needed. 08/14/17  Yes [provider]  simvastatin (ZOCOR) 20 MG tablet Take 20 mg by mouth daily. 08/17/17  Yes [provider]    Vital Signs: BP (!) 98/59 (BP Location: Left Arm)   Pulse 80   Temp 98.2 F (36.8 C) (Oral)   Resp 18   Ht 4' 10.5" (1.486 m)   Wt 200 lb (90.7 kg)   SpO2 95%   BMI 41.09 kg/m   Physical Exam: Abd: right flank with PCN in place with drain site c/d/i.  Bag with clear urine output. Heart: regular Lungs: CTAB  Imaging: Ct Abdomen Pelvis Wo Contrast  Result Date: 08/20/2017 CLINICAL DATA:  Abdominal pain EXAM: CT ABDOMEN AND PELVIS WITHOUT CONTRAST TECHNIQUE: Multidetector CT imaging of the abdomen and pelvis was performed following the standard protocol without IV contrast. COMPARISON:  MRI 02/10/2010 FINDINGS: Lower chest: Lung bases  demonstrate no acute consolidation or pleural effusion. The heart size is within normal limits. Hepatobiliary: Vague hypodense mass with central calcification in the right hepatic lobe measuring at least 8.5 cm and presumably corresponding to the 2011 demonstrated MRI mass/ thought than to represent hemangioma. No calcified gallstones. No biliary dilatation Pancreas: Unremarkable. No pancreatic ductal dilatation or surrounding inflammatory changes. Spleen: Enlarged, measuring 14 cm Adrenals/Urinary Tract: Left adrenal gland is within normal limits. Possible cyst in the mid to lower pole left kidney. Severe hydronephrosis and hydroureter of the right kidney with distal tapering of the ureter. Bladder within normal limits. No stones are seen Stomach/Bowel: The stomach is nonenlarged. There is no dilated small bowel. No colon wall thickening. Vascular/Lymphatic: Aortic atherosclerosis. Multiple enlarged periaortic/retroperitoneal lymph nodes, measuring up to 2 cm. Right greater than left iliac adenopathy. Reproductive: Calcified uterine fibroids.  No adnexal masses. Other: Negative for free air or free fluid. Musculoskeletal: Degenerative changes. No acute or suspicious bone lesion IMPRESSION: 1. Vague hypodense mass with central calcification in the right hepatic lobe presumably corresponding to the MRI mass from 2011 which was thought to represent hemangioma 2. Moderate retroperitoneal somewhat bulky adenopathy. Splenomegaly. Findings raise concern for metastatic disease or lymphoproliferative abnormality such as lymphoma. Clinical correlation is recommended 3. Negative for a bowel obstruction or colon wall thickening 4. Severe right hydronephrosis and hydroureter with distal tapering of the ureter. No definitive stones are seen, findings could be secondary to a stricture 5. Calcified uterine fibroids Electronically  Signed   By: Donavan Foil M.D.   On: 08/20/2017 23:55   Dg Chest 2 View  Result Date:  08/20/2017 CLINICAL DATA:  Weakness nausea and vomiting EXAM: CHEST  2 VIEW COMPARISON:  None. FINDINGS: The heart size and mediastinal contours are within normal limits. Both lungs are clear. Degenerative changes of the spine. IMPRESSION: No active cardiopulmonary disease. Electronically Signed   By: Donavan Foil M.D.   On: 08/20/2017 19:42   Ir Nephrostomy Placement Right  Result Date: 08/21/2017 INDICATION: Right ureteral obstruction EXAM: PERCUTANEOUS RIGHT NEPHROSTOMY PLACEMENT COMPARISON:  None. MEDICATIONS: Ciprofloxacin 400 mg IV; The antibiotic was administered in an appropriate time frame prior to skin puncture. ANESTHESIA/SEDATION: Fentanyl 50 mcg IV; Versed 1 mg IV Moderate Sedation Time:  18 The patient was continuously monitored during the procedure by the interventional radiology nurse under my direct supervision. CONTRAST:  10 cc Isovue-300 - administered into the collecting system(s) FLUOROSCOPY TIME:  Fluoroscopy Time: 1 minutes  seconds (3 mGy). COMPLICATIONS: None immediate. PROCEDURE: Informed written consent was obtained from the patient after a thorough discussion of the procedural risks, benefits and alternatives. All questions were addressed. Maximal Sterile Barrier Technique was utilized including caps, mask, sterile gowns, sterile gloves, sterile drape, hand hygiene and skin antiseptic. A timeout was performed prior to the initiation of the procedure. The back was prepped and draped in a sterile fashion. Under sonographic guidance, a 21 gauge needle was inserted into a posterior lower pole calyx and removed over a 018 wire. This was up sized to an Amplatz. Ten [place] dilator followed by a 10 [place] drain were inserted. It was looped and string fixed in the renal pelvis. Contrast was injected. A sample of urine was sent for culture. FINDINGS: Imaging documents placement of a 10 French right percutaneous nephrostomy catheter into the renal pelvis. IMPRESSION: Successful right percutaneous  nephrostomy catheter placement. Electronically Signed   By: Marybelle Killings M.D.   On: 08/21/2017 14:48    Labs:  CBC:  Recent Labs  08/20/17 1754 08/20/17 1933 08/21/17 0036 08/21/17 0233 08/22/17 1051  WBC 19.0*  --  15.1* 15.9* 14.0*  HGB 10.3* 9.5* 8.0* 9.1* 8.0*  HCT 31.8* 28.0* 24.2* 27.9* 24.7*  PLT 123*  --  100* 111* 130*    COAGS:  Recent Labs  08/20/17 2255  INR 1.16  APTT 24    BMP:  Recent Labs  08/20/17 1754 08/20/17 1933 08/21/17 0036 08/21/17 0832 08/22/17 1051  NA 132* 131* 132*  133* 135 137  K 3.9 3.7 3.5  3.5 3.3* 3.8  CL 92* 91* 96*  96* 100* 105  CO2 29  --  27  29 23 26   GLUCOSE 113* 110* 117*  122* 85 85  BUN 27* 31* 28*  28* 28* 27*  CALCIUM 13.7*  --  12.2*  12.3* 12.8* 13.3*  CREATININE 2.08* 2.20* 1.75*  1.74* 1.56* 1.34*  GFRNONAA 23*  --  28*  28* 32* 39*  GFRAA 26*  --  33*  33* 37* 45*    LIVER FUNCTION TESTS:  Recent Labs  08/20/17 1931 08/21/17 0832  BILITOT 2.0*  --   AST 29  --   ALT 32  --   ALKPHOS 90  --   PROT 5.2*  --   ALBUMIN 2.8* 2.0*    Assessment and Plan: 1. S/p R PCN placement Drain is doing well and working well. Drain will need to stay in place until urology can plan for cystoscopy  as an outpatient and further plans.  Will defer further care to urology.  She will need a routine drain exchange at 6 weeks if drain still in place.  2. RTP LAD  We will plan to pursue a left RTP lymph node biopsy tomorrow.  She will be NPo p MN.  I have discussed this with the patient and she understands and is agreeable to proceed. Risks and benefits discussed with the patient including, but not limited to bleeding, infection, damage to adjacent structures or low yield requiring additional tests. All of the patient's questions were answered, patient is agreeable to proceed. Consent signed and in chart.    Electronically Signed: Henreitta Cea 08/22/2017, 12:16 PM   I spent a total of 25 Minutes at  the the patient's bedside AND on the patient's hospital floor or unit, greater than 50% of which was counseling/coordinating care for obstructive uropathy, retroperitoneal lymphadenopathy

## 2017-08-23 ENCOUNTER — Inpatient Hospital Stay (HOSPITAL_COMMUNITY): Payer: Medicare Other

## 2017-08-23 DIAGNOSIS — N1330 Unspecified hydronephrosis: Secondary | ICD-10-CM

## 2017-08-23 DIAGNOSIS — F449 Dissociative and conversion disorder, unspecified: Secondary | ICD-10-CM

## 2017-08-23 DIAGNOSIS — R160 Hepatomegaly, not elsewhere classified: Secondary | ICD-10-CM

## 2017-08-23 DIAGNOSIS — G9341 Metabolic encephalopathy: Secondary | ICD-10-CM

## 2017-08-23 DIAGNOSIS — D696 Thrombocytopenia, unspecified: Secondary | ICD-10-CM

## 2017-08-23 DIAGNOSIS — R531 Weakness: Secondary | ICD-10-CM

## 2017-08-23 DIAGNOSIS — M069 Rheumatoid arthritis, unspecified: Secondary | ICD-10-CM

## 2017-08-23 LAB — CBC
HEMATOCRIT: 23.8 % — AB (ref 36.0–46.0)
Hemoglobin: 7.7 g/dL — ABNORMAL LOW (ref 12.0–15.0)
MCH: 33.3 pg (ref 26.0–34.0)
MCHC: 32.4 g/dL (ref 30.0–36.0)
MCV: 103 fL — ABNORMAL HIGH (ref 78.0–100.0)
Platelets: 120 10*3/uL — ABNORMAL LOW (ref 150–400)
RBC: 2.31 MIL/uL — ABNORMAL LOW (ref 3.87–5.11)
RDW: 18.1 % — AB (ref 11.5–15.5)
WBC: 14.3 10*3/uL — AB (ref 4.0–10.5)

## 2017-08-23 LAB — BASIC METABOLIC PANEL
ANION GAP: 8 (ref 5–15)
BUN: 31 mg/dL — ABNORMAL HIGH (ref 6–20)
CALCIUM: 12.9 mg/dL — AB (ref 8.9–10.3)
CO2: 24 mmol/L (ref 22–32)
Chloride: 105 mmol/L (ref 101–111)
Creatinine, Ser: 1.14 mg/dL — ABNORMAL HIGH (ref 0.44–1.00)
GFR, EST AFRICAN AMERICAN: 55 mL/min — AB (ref >60)
GFR, EST NON AFRICAN AMERICAN: 47 mL/min — AB (ref >60)
Glucose, Bld: 76 mg/dL (ref 65–99)
Potassium: 3.5 mmol/L (ref 3.5–5.1)
Sodium: 137 mmol/L (ref 135–145)

## 2017-08-23 LAB — GLUCOSE, CAPILLARY
GLUCOSE-CAPILLARY: 84 mg/dL (ref 65–99)
Glucose-Capillary: 96 mg/dL (ref 65–99)

## 2017-08-23 MED ORDER — SODIUM CHLORIDE 0.9 % IV SOLN
INTRAVENOUS | Status: DC
  Administered 2017-08-23 – 2017-08-24 (×2): via INTRAVENOUS

## 2017-08-23 MED ORDER — FENTANYL CITRATE (PF) 100 MCG/2ML IJ SOLN
INTRAMUSCULAR | Status: AC | PRN
  Administered 2017-08-23: 50 ug via INTRAVENOUS

## 2017-08-23 MED ORDER — CALCITONIN (SALMON) 200 UNIT/ML IJ SOLN
4.0000 [IU]/kg | Freq: Two times a day (BID) | INTRAMUSCULAR | Status: AC
  Administered 2017-08-23 – 2017-08-24 (×4): 362 [IU] via INTRAMUSCULAR
  Filled 2017-08-23 (×4): qty 1.81

## 2017-08-23 MED ORDER — MIDAZOLAM HCL 2 MG/2ML IJ SOLN
INTRAMUSCULAR | Status: AC
  Filled 2017-08-23: qty 2

## 2017-08-23 MED ORDER — MIDAZOLAM HCL 2 MG/2ML IJ SOLN
INTRAMUSCULAR | Status: AC | PRN
  Administered 2017-08-23: 1 mg via INTRAVENOUS

## 2017-08-23 MED ORDER — FENTANYL CITRATE (PF) 100 MCG/2ML IJ SOLN
INTRAMUSCULAR | Status: AC
  Filled 2017-08-23: qty 2

## 2017-08-23 MED ORDER — LIDOCAINE HCL (PF) 1 % IJ SOLN
INTRAMUSCULAR | Status: AC
  Filled 2017-08-23: qty 30

## 2017-08-23 MED ORDER — FUROSEMIDE 10 MG/ML IJ SOLN
20.0000 mg | Freq: Once | INTRAMUSCULAR | Status: AC
  Administered 2017-08-23: 20 mg via INTRAVENOUS
  Filled 2017-08-23: qty 2

## 2017-08-23 NOTE — Progress Notes (Addendum)
PROGRESS NOTE   Kristina Torres  XVQ:[id number]    DOB: [birth year]    DOA: 08/20/2017  PCP: [name], MD   I have briefly reviewed patients previous medical records in [facility].  Brief Narrative:  71 year old married female, PMH of HTN, HLD, gout, rheumatoid arthritis and renal mass presented to ED on 08/20/17 with complaints of nausea, nonbloody emesis, RUQ abdominal pain, lightheadedness, generalized weakness and cough. She was seen at [facility] on Tuesday prior to admission and found to have "right renal mass on ultrasound with blockage", started on oral ciprofloxacin for UTI and was supposed to see Urologist on Thursday with a CT scan. In the ED, transiently hypotensive, noted to have acute kidney injury with creatinine of 2.08, hypercalcemia with calcium of 13.87, oxygen saturation 90% on room air and CT abdomen demonstrated right hepatic lobe with hypodense mass, moderate retroperitoneal bulky lymphadenopathy, splenomegaly, severe right hydronephrosis and hydroureter. Urology consulted. IR placed right percutaneous nephrostomy. Oncology consulted and recommended retroperitoneal lymph node biopsy by IR, scheduled for 9/6. Ongoing hypercalcemia and mental status changes, being managed as below.  Assessment & Plan:   Principal Problem:   Nausea & vomiting Active Problems:   Rheumatoid arthritis ([place])   Essential hypertension   Gout   UTI (urinary tract infection)   Sepsis (HCC)   HLD (hyperlipidemia)   Hypotension   AKI (acute kidney injury) ([place])   Hypercalcemia   Thrombocytopenia (HCC)   Cough   Macrocytic anemia   Lymphadenopathy   1. Severe right hydronephrosis and hydroureter: Urology consulted 9/4 and recommended nephrostomy tube placement by IR which was done same day. Patient had been empirically started on IV ceftriaxone but urine culture from 9/3 and from drain on 9/4 were negative and hence ceftriaxone discontinued. Await urology input regarding  further management. 2. Hypotension: Likely related to dehydration from poor oral intake and GI losses. Continue IV fluids. Although had SIRS criteria on admission, not likely from infectious source. Blood cultures 2: Negative to date. Resolved. 3. Nausea and vomiting: Secondary to obstructive uropathy and acute kidney injury. Resolved. 4. Hypercalcemia: Patient liberally takes Tums at home for unclear reasons. Holding HCTZ, calcium and vitamin D supplements. Despite aggressive IV normal saline hydration and creatinine normalized, persistent hypercalcemia (corrected calcium approximately 14.5 on 9/6). Status post pamidronate 60 mg IV 1 on 6/5 and may take a couple days to take effect. Changed calcitonin to IV as discussed with pharmacy and oncology. Also provided a dose of Lasix IV. This is likely contributing to patient's mental status changes. Follow CMP in a.m. Vitamin D, 25-hydroxy: 30.2. PTH related peptide: Pending. PTH: 16. 5. Anemia: Closely follow CBCs and transfuse if hemoglobin <7. As per oncology, likely related to methotrexate and anemia of chronic disease. Hemoglobin has dropped to 7.7 in the absence of reported acute bleeding. Follow CBC in a.m. anemia panel: Iron 29, TIBC 214, saturation 14, ferritin 745, folate 9.6, B12: 767. Suggests anemia of chronic disease. 6. Thrombocytopenia: Stable without bleeding. As per oncology, likely related to methotrexate or autoimmune mediated. 7. Hypokalemia: Replaced: 8. Elevated lactate: Likely due to dehydration and acute kidney injury. Improved. 9. Acute kidney injury: As per admitting physician, creatinine was 0.93 on 08/13/17. Presented with creatinine of 2.08. Secondary to obstructive uropathy and dehydration. Creatinine has improved to 1.14. Continue IV fluids and follow BMP in a.m. 10. Dry cough: Apparently chronic and patient smokes. Chest x-ray negative. Stable. 11. Essential hypertension: HCTZ held. Blood pressure is currently controlled off  antihypertensives.  12. Gout: No acute flare. Continue allopurinol. 13. Hyperlipidemia: Statins. 14. Liver mass: CT abdomen showed awake hypodense mass with central calcification in the right hepatic lobe presumably corresponding to the MRI mass from 2011 which was thought to represent hemangioma. CT also showed moderate retroperitoneal somewhat bulky lymphadenopathy and splenomegaly raising concerns for metastatic disease or lymphoproliferative abnormality such as lymphoma. Oncology consulted 9/4 and indicate that the liver lesion is not suspicious for malignancy. Oncology did recommend CT-guided retroperitoneal lymph node biopsy which will be done by IR on 9/6 and a CT chest without contrast to rule out malignancy (ordered). Discussed with oncology. 15. Rheumatoid arthritis: Methotrexate.  16. Urethral/bladder mass: As per urology consultation, outpatient cystoscopy 17. Acute metabolic encephalopathy: Likely related to acute kidney injury and hypercalcemia (likely the main cause). Improved. Avoid sedating medications and carefully reviewed current medications. No focal deficits. If mental status does not start to improve with correction of metabolic abnormalities, then consider CT head. 37. Fall: As per RN, sustained fall yesterday without injuries. PT evaluation. Fall precautions.   DVT prophylaxis: SCDs Code Status: Full Family Communication: Discussed in detail with patient's sister and brother-in-law at bedside. Disposition: To be determined pending clinical improvement. Will likely need SNF.   Consultants:  Urology Medical oncology Interventional radiology   Procedures:  Right percutaneous nephrostomy  Antimicrobials:  IV Rocephin-discontinued 9/5    Subjective: Patient somnolent but easily arousable. Oriented to self. Mumbles incomprehensively at times. As per spouse at bedside, ongoing intermittent confusion, talks about people who have not been part of that leg for a long time,  poor appetite, did not sleep well all last night and started sleeping earlier this morning. No significant pain reported. Generalized weakness.  ROS: No pain, nausea or vomiting  Objective:  Vitals:   08/23/17 1446 08/23/17 1515 08/23/17 1517 08/23/17 1520  BP: 115/62 105/63 101/66 109/74  Pulse: 95 91 (!) 101 (!) 102  Resp: (!) 30 (!) 27 (!) 28 10  Temp:      TempSrc:      SpO2: 94% 100% 94% 90%  Weight:      Height:      Temperature 97.52F.  Examination:  General exam: Pleasant elderly female, moderately built and overweight, lying comfortably propped up in bed. Oral mucosa moist. Generally ill looking but does not appear in distress. Respiratory system: Clear to auscultation without wheezing, rhonchi or crackles. No increased work of breathing. Cardiovascular system: S1 and S2 heard, RRR. No JVD, murmurs or pedal edema. Telemetry: Sinus rhythm. Gastrointestinal system: Abdomen is nondistended, soft and nontender. No organomegaly or masses felt. Normal bowel sounds heard. Right percutaneous nephrostomy +. Central nervous system: Mental status changes as indicated above. No focal neurological deficits. Extremities: Moves all extremities symmetrically. Skin: No rashes, lesions or ulcers Psychiatry: Judgement and insight impaired. Mood & affect cannot be assessed.     Data Reviewed: I have personally reviewed following labs and imaging studies  CBC:  Recent Labs Lab 08/20/17 1754 08/20/17 1933 08/21/17 0036 08/21/17 0233 08/22/17 1051 08/23/17 0422  WBC 19.0*  --  15.1* 15.9* 14.0* 14.3*  NEUTROABS  --   --  7.3  --   --   --   HGB 10.3* 9.5* 8.0* 9.1* 8.0* 7.7*  HCT 31.8* 28.0* 24.2* 27.9* 24.7* 23.8*  MCV 101.6*  --  100.8* 100.7* 102.1* 103.0*  PLT 123*  --  100* 111* 130* 301*   Basic Metabolic Panel:  Recent Labs Lab 08/20/17 1754 08/20/17 1933 08/21/17 0036 08/21/17  4403 08/22/17 1051 08/23/17 0422  NA 132* 131* 132*  133* 135 137 137  K 3.9 3.7 3.5   3.5 3.3* 3.8 3.5  CL 92* 91* 96*  96* 100* 105 105  CO2 29  --  27  29 23 26 24   GLUCOSE 113* 110* 117*  122* 85 85 76  BUN 27* 31* 28*  28* 28* 27* 31*  CREATININE 2.08* 2.20* 1.75*  1.74* 1.56* 1.34* 1.14*  CALCIUM 13.7*  --  12.2*  12.3* 12.8* 13.3* 12.9*   Liver Function Tests:  Recent Labs Lab 08/20/17 1931 08/21/17 0832  AST 29  --   ALT 32  --   ALKPHOS 90  --   BILITOT 2.0*  --   PROT 5.2*  --   ALBUMIN 2.8* 2.0*   Coagulation Profile:  Recent Labs Lab 08/20/17 2255  INR 1.16   Cardiac Enzymes: No results for input(s): CKTOTAL, CKMB, CKMBINDEX, TROPONINI in the last 168 hours. HbA1C: No results for input(s): HGBA1C in the last 72 hours. CBG:  Recent Labs Lab 08/21/17 1149 08/22/17 0759 08/23/17 0738 08/23/17 1151  GLUCAP 79 84 84 96    Recent Results (from the past 240 hour(s))  Blood Culture (routine x 2)     Status: None (Preliminary result)   Collection Time: 08/20/17  8:10 PM  Result Value Ref Range Status   Specimen Description BLOOD RIGHT HAND  Final   Special Requests IN PEDIATRIC BOTTLE Blood Culture adequate volume  Final   Culture NO GROWTH 3 DAYS  Final   Report Status PENDING  Incomplete  Blood Culture (routine x 2)     Status: None (Preliminary result)   Collection Time: 08/20/17  8:13 PM  Result Value Ref Range Status   Specimen Description BLOOD RIGHT ANTECUBITAL  Final   Special Requests   Final    BOTTLES DRAWN AEROBIC AND ANAEROBIC Blood Culture adequate volume   Culture NO GROWTH 3 DAYS  Final   Report Status PENDING  Incomplete  Urine Culture     Status: None   Collection Time: 08/20/17  9:15 PM  Result Value Ref Range Status   Specimen Description URINE, CATHETERIZED  Final   Special Requests NONE  Final   Culture NO GROWTH  Final   Report Status 08/22/2017 FINAL  Final  Urine Culture     Status: None   Collection Time: 08/21/17  2:32 PM  Result Value Ref Range Status   Specimen Description URINE, CATHETERIZED   URINE FROM DRAIN PLACEMENT  Final   Special Requests NONE  Final   Culture NO GROWTH  Final   Report Status 08/22/2017 FINAL  Final         Radiology Studies: No results found.      Scheduled Meds: . allopurinol  300 mg Oral Daily  . calcitonin  4 Units/kg Intramuscular BID  . dextromethorphan-guaiFENesin  1 tablet Oral BID  . fentaNYL      . lidocaine (PF)      . [START ON 08/27/2017] methotrexate  17.5 mg Oral Weekly  . midazolam      . simvastatin  20 mg Oral Daily   Continuous Infusions: . sodium chloride 125 mL/hr at 08/23/17 1055     LOS: 3 days     HONGALGI,ANAND, MD, FACP, FHM. [facility] Pager [phone]  If 7PM-7AM, please contact night-coverage [email or web address] Password [email or web address] 08/23/2017, 3:28 PM

## 2017-08-23 NOTE — Progress Notes (Signed)
Kristina Torres   DOB:12-15-1946   PP#:[id number]   WPY#:[id number]  Hematology and Oncology service follow up note   Subjective: Pt underwent retroperitoneal lymph node biopsy by interventional radiology this afternoon, was very sleepy when I saw her after procedure. Her husband is very concerned that she is still very weak, intermittently confused, poor appetite. Her calcium remains to be high.   Objective:  Vitals:   08/23/17 1520 08/23/17 1825  BP: 109/74 (!) 104/49  Pulse: (!) 102 80  Resp: 10 (!) 22  Temp:  97.9 F (36.6 C)  SpO2: 90% 95%    Body mass index is 41.09 kg/m.  Intake/Output Summary (Last 24 hours) at 08/23/17 2055 Last data filed at 08/23/17 1749  Gross per 24 hour  Intake              205 ml  Output             1702 ml  Net            -1497 ml     Sclerae unicteric  Oropharynx clear  No peripheral adenopathy  Lungs clear -- no rales or rhonchi  Heart regular rate and rhythm  Abdomen benign  MSK no focal spinal tenderness, no peripheral edema  Neuro nonfocal, somnolent    CBG (last 3)   Recent Labs  08/22/17 0759 08/23/17 0738 08/23/17 1151  GLUCAP 84 84 96     Labs:  Urine Studies No results for input(s): UHGB, CRYS in the last 72 hours.  Invalid input(s): UACOL, UAPR, USPG, UPH, UTP, UGL, UKET, UBIL, UNIT, UROB, [place], UEPI, UWBC, [place], [place], [place], [place], [place]  Basic Metabolic Panel:  Recent Labs Lab 08/20/17 1754 08/20/17 1933 08/21/17 0036 08/21/17 0832 08/22/17 1051 08/23/17 0422  NA 132* 131* 132*  133* 135 137 137  K 3.9 3.7 3.5  3.5 3.3* 3.8 3.5  CL 92* 91* 96*  96* 100* 105 105  CO2 29  --  27  29 23 26 24   GLUCOSE 113* 110* 117*  122* 85 85 76  BUN 27* 31* 28*  28* 28* 27* 31*  CREATININE 2.08* 2.20* 1.75*  1.74* 1.56* 1.34* 1.14*  CALCIUM 13.7*  --  12.2*  12.3* 12.8* 13.3* 12.9*   GFR Estimated Creatinine Clearance: 43.9 mL/min (A) (by C-G formula based on SCr of 1.14 mg/dL (H)). Liver Function  Tests:  Recent Labs Lab 08/20/17 1931 08/21/17 0832  AST 29  --   ALT 32  --   ALKPHOS 90  --   BILITOT 2.0*  --   PROT 5.2*  --   ALBUMIN 2.8* 2.0*    Recent Labs Lab 08/20/17 2255  LIPASE 22   No results for input(s): AMMONIA in the last 168 hours. Coagulation profile  Recent Labs Lab 08/20/17 2255  INR 1.16    CBC:  Recent Labs Lab 08/20/17 1754 08/20/17 1933 08/21/17 0036 08/21/17 0233 08/22/17 1051 08/23/17 0422  WBC 19.0*  --  15.1* 15.9* 14.0* 14.3*  NEUTROABS  --   --  7.3  --   --   --   HGB 10.3* 9.5* 8.0* 9.1* 8.0* 7.7*  HCT 31.8* 28.0* 24.2* 27.9* 24.7* 23.8*  MCV 101.6*  --  100.8* 100.7* 102.1* 103.0*  PLT 123*  --  100* 111* 130* 120*   Cardiac Enzymes: No results for input(s): CKTOTAL, CKMB, CKMBINDEX, TROPONINI in the last 168 hours. BNP: Invalid input(s): POCBNP CBG:  Recent Labs Lab 08/21/17 1149 08/22/17 0759 08/23/17  8242 08/23/17 1151  GLUCAP 79 84 84 96   D-Dimer No results for input(s): DDIMER in the last 72 hours. Hgb A1c No results for input(s): HGBA1C in the last 72 hours. Lipid Profile No results for input(s): CHOL, HDL, LDLCALC, TRIG, CHOLHDL, LDLDIRECT in the last 72 hours. Thyroid function studies No results for input(s): TSH, T4TOTAL, T3FREE, THYROIDAB in the last 72 hours.  Invalid input(s): FREET3 Anemia work up  Recent Labs  08/21/17 0036  VITAMINB12 767  FOLATE 9.7  FERRITIN 745*  TIBC 214*  IRON 29  RETICCTPCT 3.9*   Microbiology Recent Results (from the past 240 hour(s))  Blood Culture (routine x 2)     Status: None (Preliminary result)   Collection Time: 08/20/17  8:10 PM  Result Value Ref Range Status   Specimen Description BLOOD RIGHT HAND  Final   Special Requests IN PEDIATRIC BOTTLE Blood Culture adequate volume  Final   Culture NO GROWTH 3 DAYS  Final   Report Status PENDING  Incomplete  Blood Culture (routine x 2)     Status: None (Preliminary result)   Collection Time: 08/20/17   8:13 PM  Result Value Ref Range Status   Specimen Description BLOOD RIGHT ANTECUBITAL  Final   Special Requests   Final    BOTTLES DRAWN AEROBIC AND ANAEROBIC Blood Culture adequate volume   Culture NO GROWTH 3 DAYS  Final   Report Status PENDING  Incomplete  Urine Culture     Status: None   Collection Time: 08/20/17  9:15 PM  Result Value Ref Range Status   Specimen Description URINE, CATHETERIZED  Final   Special Requests NONE  Final   Culture NO GROWTH  Final   Report Status 08/22/2017 FINAL  Final  Urine Culture     Status: None   Collection Time: 08/21/17  2:32 PM  Result Value Ref Range Status   Specimen Description URINE, CATHETERIZED  URINE FROM DRAIN PLACEMENT  Final   Special Requests NONE  Final   Culture NO GROWTH  Final   Report Status 08/22/2017 FINAL  Final      Studies:  Ct Chest Wo Contrast  Result Date: 08/23/2017 CLINICAL DATA:  History of dry cough EXAM: CT CHEST WITHOUT CONTRAST TECHNIQUE: Multidetector CT imaging of the chest was performed following the standard protocol without IV contrast. Patient appears to have been scanned in the prone position. COMPARISON:  08/20/2017, 08/23/2017, 08/20/2017 FINDINGS: Cardiovascular: Limited evaluation without intravenous contrast. Atherosclerotic calcifications of the aorta. No aneurysmal dilatation. Borderline heart size. No pericardial effusion. Mediastinum/Nodes: No thyroid mass. Midline trachea. Scattered subcentimeter mediastinal lymph nodes. Limited evaluation for hilar adenopathy. Esophagus within normal limits. Enlarged left supraclavicular lymph node measuring 14 mm. Lungs/Pleura: Hazy dependent atelectasis anteriorly. Mild emphysema in the upper lobes. Mild bronchiectasis and bandlike consolidation in the posterior lower lobes, possibly scarring. Negative for pneumothorax. Small dependent anterior pleural effusions. Upper Abdomen: Vague hypodense mass in the liver with coarse calcification re- demonstrated. Prominent  appearing spleen. Edema within the lateral chest walls inferiorly. Musculoskeletal: Degenerative changes. No definite acute or suspicious bone lesion. IMPRESSION: 1. Mild emphysema and small pleural effusions. Mild posterior lower lobe bronchiectasis with associated consolidation, uncertain if this is chronic consolidation or mild infiltrate. 2. Enlarged left supraclavicular lymph node measuring up to 14 mm 3. Small pleural effusions 4. Visible spleen in the upper abdomen appears enlarged 5. Partially visualized centrally calcified mass in the liver Aortic Atherosclerosis (ICD10-I70.0) and Emphysema (ICD10-J43.9). Electronically Signed   By: Maudie Mercury  Francoise Ceo M.D.   On: 08/23/2017 15:34   Ct Biopsy  Result Date: 08/23/2017 INDICATION: 71 year old female with retroperitoneal lymphadenopathy. She presents for biopsy for tissue diagnosis. EXAM: CT BIOPSY MEDICATIONS: None. ANESTHESIA/SEDATION: Moderate (conscious) sedation was employed during this procedure. A total of Versed 1 mg and Fentanyl 50 mcg was administered intravenously. Moderate Sedation Time: 15 minutes. The patient's level of consciousness and vital signs were monitored continuously by radiology nursing throughout the procedure under my direct supervision. FLUOROSCOPY TIME:  Fluoroscopy Time: 0 minutes 0 seconds (0 mGy). COMPLICATIONS: None immediate. PROCEDURE: Informed written consent was obtained from the patient after a thorough discussion of the procedural risks, benefits and alternatives. All questions were addressed. A timeout was performed prior to the initiation of the procedure. The patient was placed prone on the CT gantry. A planning axial CT scan was performed. A left para-aortic retroperitoneal lymph node was successfully identified. The overlying skin was marked and then sterilely prepped and draped in standard fashion using chlorhexidine skin prep. Local anesthesia was attained by infiltration with 1% lidocaine. A small dermatotomy was  made. Under intermittent CT guidance, a 17 gauge introducer needle was advanced through the left paraspinal musculature and positioned at the margin of the retroperitoneal lymph node. 18 gauge core biopsies were then coaxially obtained. Biopsy specimens were placed in saline and delivered to pathology for further analysis. The introducer needle was removed. Post biopsy axial CT imaging demonstrates no evidence of immediate complication. The patient tolerated the procedure well. IMPRESSION: Successful core biopsy of left periaortic retroperitoneal lymph node. Of note, the lymph node is very deep and only 1 core could be obtained. Signed, Criselda Peaches, MD [facility] Radiology Electronically Signed   By: Jacqulynn Cadet M.D.   On: 08/23/2017 17:33    Assessment: Kristina Torres is a 71 y.o. female with a history of HTN, gout, HLD and Rheumatoid Arthritis.   1. Liver Mass in Right hepatic Lobe and PR adenopathy , rule out lymphoma or malignancy 2. Macrocytic anemia, likely secondary to MTX and anemia of chronic disease, worsening  3. Mild thrombocytopenia, possibly related to MTX, or autoimmune mediated 4. RA 5. HTN  6. Right hydronephrosis secondary to ureteral obstruction, status post nephrostomy tube placement 7. Severe hypercalcemia, persistent  8. Nausea and vomiting  9. Metabolic encephalopathy, likely secondary to hypercalcemia  Plan:  -Her retroperitoneal lymph node biopsy was done this afternoon, results still pending, we'll contact pathology tomorrow morning. -I reviewed her CT chest scan findings with her husband, which was unremarkable, except a 1.4 cm left supraclavicular lymph node -if her node biopsy is negative, I will suggest re-consult urology to consider cystoscopy to rule out ureter or bladder cancer  -I discussed hypercalcemia management with Dr. Algis Liming, will try lasix and calcitonin, she has received pamidronate on  9/5, I anticipate her calcium level will come down in the next few days -her worsening anemia is likely related to hydration, consider 1-2u RBC transfusion if Hb<7.5 -Her baseline anemia and thrombocytopenia is probably related to her methotrexate, I don't have a high suspicion for primary bone marrow disease, will check MM panel although it's unlikely (no bone lesion on CT, resolving AKI, low TP level, etc), no lab evidence of nutritional anemia or hemoptysis  -I will follow up tomorrow    Truitt Merle, MD 08/23/2017  8:55 PM

## 2017-08-23 NOTE — Procedures (Signed)
Interventional Radiology Procedure Note  Procedure: Bx of left RP adenopathy.  Very deep, could only obtain 1 core.   Complications: None  Estimated Blood Loss: None  Recommendations: - Bedrest  Signed,  Criselda Peaches, MD

## 2017-08-24 ENCOUNTER — Other Ambulatory Visit: Payer: Self-pay | Admitting: Interventional Radiology

## 2017-08-24 ENCOUNTER — Inpatient Hospital Stay (HOSPITAL_COMMUNITY): Payer: Medicare Other

## 2017-08-24 DIAGNOSIS — E876 Hypokalemia: Secondary | ICD-10-CM

## 2017-08-24 HISTORY — PX: OTHER SURGICAL HISTORY: SHX169

## 2017-08-24 LAB — BASIC METABOLIC PANEL WITH GFR
Anion gap: 6 (ref 5–15)
BUN: 32 mg/dL — ABNORMAL HIGH (ref 6–20)
CO2: 28 mmol/L (ref 22–32)
Calcium: 11.6 mg/dL — ABNORMAL HIGH (ref 8.9–10.3)
Chloride: 105 mmol/L (ref 101–111)
Creatinine, Ser: 1.05 mg/dL — ABNORMAL HIGH (ref 0.44–1.00)
GFR calc Af Amer: >60 mL/min
GFR calc non Af Amer: 52 mL/min — ABNORMAL LOW
Glucose, Bld: 110 mg/dL — ABNORMAL HIGH (ref 65–99)
Potassium: 3.2 mmol/L — ABNORMAL LOW (ref 3.5–5.1)
Sodium: 139 mmol/L (ref 135–145)

## 2017-08-24 LAB — CBC
HCT: 23.7 % — ABNORMAL LOW (ref 36.0–46.0)
Hemoglobin: 7.7 g/dL — ABNORMAL LOW (ref 12.0–15.0)
MCH: 33.2 pg (ref 26.0–34.0)
MCHC: 32.5 g/dL (ref 30.0–36.0)
MCV: 102.2 fL — ABNORMAL HIGH (ref 78.0–100.0)
PLATELETS: 120 10*3/uL — AB (ref 150–400)
RBC: 2.32 MIL/uL — AB (ref 3.87–5.11)
RDW: 18 % — ABNORMAL HIGH (ref 11.5–15.5)
WBC: 14.5 10*3/uL — ABNORMAL HIGH (ref 4.0–10.5)

## 2017-08-24 LAB — ALBUMIN: ALBUMIN: 1.9 g/dL — AB (ref 3.5–5.0)

## 2017-08-24 MED ORDER — FENTANYL CITRATE (PF) 100 MCG/2ML IJ SOLN
INTRAMUSCULAR | Status: AC
  Filled 2017-08-24: qty 2

## 2017-08-24 MED ORDER — PIPERACILLIN-TAZOBACTAM 3.375 G IVPB
3.3750 g | Freq: Three times a day (TID) | INTRAVENOUS | Status: DC
  Administered 2017-08-24 – 2017-08-28 (×8): 3.375 g via INTRAVENOUS
  Filled 2017-08-24 (×15): qty 50

## 2017-08-24 MED ORDER — LIDOCAINE HCL (PF) 1 % IJ SOLN
INTRAMUSCULAR | Status: AC
  Filled 2017-08-24: qty 30

## 2017-08-24 MED ORDER — VANCOMYCIN HCL 10 G IV SOLR
2000.0000 mg | Freq: Once | INTRAVENOUS | Status: AC
  Administered 2017-08-24: 2000 mg via INTRAVENOUS
  Filled 2017-08-24 (×2): qty 2000

## 2017-08-24 MED ORDER — VANCOMYCIN HCL IN DEXTROSE 750-5 MG/150ML-% IV SOLN
750.0000 mg | Freq: Two times a day (BID) | INTRAVENOUS | Status: DC
  Administered 2017-08-25 – 2017-08-27 (×4): 750 mg via INTRAVENOUS
  Filled 2017-08-24 (×7): qty 150

## 2017-08-24 MED ORDER — SODIUM CHLORIDE 0.9 % IV SOLN
INTRAVENOUS | Status: AC
  Administered 2017-08-24: 11:00:00 via INTRAVENOUS

## 2017-08-24 MED ORDER — FUROSEMIDE 10 MG/ML IJ SOLN
20.0000 mg | Freq: Once | INTRAMUSCULAR | Status: AC
  Administered 2017-08-24: 20 mg via INTRAVENOUS
  Filled 2017-08-24: qty 2

## 2017-08-24 MED ORDER — MIDAZOLAM HCL 2 MG/2ML IJ SOLN
INTRAMUSCULAR | Status: AC
  Filled 2017-08-24: qty 2

## 2017-08-24 MED ORDER — POTASSIUM CHLORIDE CRYS ER 20 MEQ PO TBCR
40.0000 meq | EXTENDED_RELEASE_TABLET | Freq: Every day | ORAL | Status: DC
  Administered 2017-08-24: 40 meq via ORAL
  Filled 2017-08-24 (×2): qty 2

## 2017-08-24 MED ORDER — POTASSIUM CHLORIDE CRYS ER 20 MEQ PO TBCR
40.0000 meq | EXTENDED_RELEASE_TABLET | Freq: Once | ORAL | Status: AC
  Administered 2017-08-24: 40 meq via ORAL
  Filled 2017-08-24: qty 2

## 2017-08-24 NOTE — Progress Notes (Signed)
PROGRESS NOTE   Kristina Torres  ERD:[id number]    DOB: [birth year]    DOA: 08/20/2017  PCP: [name], MD   I have briefly reviewed patients previous medical records in [facility].  Brief Narrative:  71 year old married female, PMH of HTN, HLD, gout, rheumatoid arthritis and renal mass presented to ED on 08/20/17 with complaints of nausea, nonbloody emesis, RUQ abdominal pain, lightheadedness, generalized weakness and cough. She was seen at [facility] on Tuesday prior to admission and found to have "right renal mass on ultrasound with blockage", started on oral ciprofloxacin for UTI and was supposed to see Urologist on Thursday with a CT scan. In the ED, transiently hypotensive, noted to have acute kidney injury with creatinine of 2.08, hypercalcemia with calcium of 13.87, oxygen saturation 90% on room air and CT abdomen demonstrated right hepatic lobe with hypodense mass, moderate retroperitoneal bulky lymphadenopathy, splenomegaly, severe right hydronephrosis and hydroureter. Urology consulted. IR placed right percutaneous nephrostomy. Oncology consulted and underwent retroperitoneal lymph node biopsy 9/6, preliminary pathology shows malignancy, suspicious for lymphoma but inadequate sample and hence oncology requested IR for additional biopsy to be done 9/7. Hypercalcemia improving. Mental status slightly better.  Assessment & Plan:   Principal Problem:   Nausea & vomiting Active Problems:   Rheumatoid arthritis ([place])   Essential hypertension   Gout   UTI (urinary tract infection)   Sepsis (HCC)   HLD (hyperlipidemia)   Hypotension   AKI (acute kidney injury) ([place])   Hypercalcemia   Thrombocytopenia (HCC)   Cough   Macrocytic anemia   Lymphadenopathy   1. Severe right hydronephrosis and hydroureter: Urology consulted 9/4 and recommended nephrostomy tube placement by IR which was done same day. Patient had been empirically started on IV ceftriaxone but urine culture  from 9/3 and from drain on 9/4 were negative and hence ceftriaxone discontinued. Discussed with consulting urologist on 9/7, recommends DC with percutaneous nephrostomy until outpatient follow-up with Dr. Karsten Ro regarding further evaluation including cystoscopy etc. He indicated that bladder mass could well be lymphoma. 2. Hypotension: Likely related to dehydration from poor oral intake and GI losses. Continue IV fluids. Although had SIRS criteria on admission, not likely from infectious source. Blood cultures 2: Negative to date. Ongoing intermittent soft blood pressures but asymptomatic. 3. Nausea and vomiting: Secondary to obstructive uropathy and acute kidney injury. Resolved. 4. Hypercalcemia: Patient liberally takes Tums at home for unclear reasons. Holding HCTZ, calcium and vitamin D supplements. Despite aggressive IV normal saline hydration and creatinine normalized, persistent hypercalcemia (corrected calcium approximately 14.5 on 9/6). Status post pamidronate 60 mg IV 1 on 6/5 and may take a couple days to take effect. Changed calcitonin to IM as discussed with pharmacy and oncology. Also provided a dose of Lasix IV, continue. This is likely contributing to patient's mental status changes. Vitamin D, 25-hydroxy: 30.2. PTH related peptide: Pending. PTH: 16. Hypercalcemia in mental status slowly improving. 5. Anemia: Closely follow CBCs and transfuse if hemoglobin <7. As per oncology, likely related to methotrexate and anemia of chronic disease. Hemoglobin has dropped to 7.7 in the absence of reported acute bleeding. Anemia panel: Iron 29, TIBC 214, saturation 14, ferritin 745, folate 9.6, B12: 767. Suggests anemia of chronic disease. Hemoglobin stable in the mid 7 range. As per oncology, transfuse if hemoglobin <7.5 g per DL. 6. Thrombocytopenia: Stable without bleeding. As per oncology, likely related to methotrexate or autoimmune mediated. 7. Hypokalemia: Replaced: 8. Elevated lactate: Likely  due to dehydration and acute kidney injury.  Improved. 9. Acute kidney injury: As per admitting physician, creatinine was 0.93 on 08/13/17. Presented with creatinine of 2.08. Secondary to obstructive uropathy and dehydration. Creatinine has improved to 1.14. Continue IV fluids and follow BMP in a.m. 10. Dry cough: Apparently chronic and patient smokes. Chest x-ray negative. CT chest shows mild posterior lower lobe bronchiectasis with associated consolidation, uncertain if this is chronic consolidation or mild infiltrate. As per family, has some chronic cough. Monitor off of antibiotics for now. 11. Essential hypertension: HCTZ held. Blood pressure is currently controlled off antihypertensives. 12. Gout: No acute flare. Continue allopurinol. 13. Hyperlipidemia: Statins. 14. Liver mass: CT abdomen showed awake hypodense mass with central calcification in the right hepatic lobe presumably corresponding to the MRI mass from 2011 which was thought to represent hemangioma. CT also showed moderate retroperitoneal somewhat bulky lymphadenopathy and splenomegaly raising concerns for metastatic disease or lymphoproliferative abnormality such as lymphoma. Oncology consulted 9/4 and indicate that the liver lesion is not suspicious for malignancy. As per oncology, preliminary results from retroperitoneal lymph node biopsy done on 9/6 by IR suggest malignancy and possible lymphoma but inadequate tissue sample and hence repeat biopsy was requested of IR. 15. Rheumatoid arthritis: Methotrexate.  16. Urethral/bladder mass: As per urology consultation, outpatient cystoscopy. Please see discussion in problem #1. 17. Acute metabolic encephalopathy: Likely related to acute kidney injury and hypercalcemia (likely the main cause). Avoid sedating medications and carefully reviewed current medications. No focal deficits. If mental status does not start to improve with correction of metabolic abnormalities, then consider CT head.  Mental status better today as per family at bedside. Continue to monitor closely. 82. Fall: As per RN, sustained fall yesterday without injuries. PT evaluation. Fall precautions. 19. Nutrition: Poor oral intake as per family. Dietitian consulted.   DVT prophylaxis: SCDs Code Status: Full Family Communication: Discussed in detail with patient's spouse and sister at bedside. Disposition: To be determined pending clinical improvement. Will likely need SNF. Not medically stable already for discharge at this time.   Consultants:  Urology Medical oncology Interventional radiology   Procedures:  Right percutaneous nephrostomy Retroperitoneal lymph node biopsy 9/7.  Antimicrobials:  IV Rocephin-discontinued 9/5    Subjective: Patient seen this morning. Awake, alert and oriented to person and place but not to time. Denies complaints. Denies pain, dyspnea. Has intermittent mild dry cough. As per family, much more alert and coherent today than she was yesterday.  ROS: No pain, nausea or vomiting  Objective:  Vitals:   08/24/17 1603 08/24/17 1605 08/24/17 1610 08/24/17 1616  BP: 116/61 112/64 109/62 101/65  Pulse: 91 87 90 90  Resp: (!) 28 (!) 26 (!) 31 (!) 24  Temp:      TempSrc:      SpO2: 91% 93% 96% 95%  Weight:      Height:        Examination:  General exam: Pleasant elderly female, moderately built and overweight, lying comfortably propped up in bed. Oral mucosa moist. Generally ill looking but does not appear in distress.Looks better than she did compared to yesterday. Awake and alert, smiling and answer simple questions appropriately. Respiratory system: Clear to auscultation without wheezing, rhonchi or crackles. No increased work of breathing. Stable. Cardiovascular system: S1 and S2 heard, RRR. No JVD, murmurs or pedal edema. Telemetry: Sinus rhythm. Stable. Gastrointestinal system: Abdomen is nondistended, soft and nontender. No organomegaly or masses felt. Normal  bowel sounds heard. Right percutaneous nephrostomy +. Central nervous system: Mental status changes as indicated above. No  focal neurological deficits. Improved. Extremities: Moves all extremities symmetrically. Skin: No rashes, lesions or ulcers Psychiatry: Judgement and insight impaired. Mood & affect cannot be assessed.     Data Reviewed: I have personally reviewed following labs and imaging studies  CBC:  Recent Labs Lab 08/21/17 0036 08/21/17 0233 08/22/17 1051 08/23/17 0422 08/24/17 0454  WBC 15.1* 15.9* 14.0* 14.3* 14.5*  NEUTROABS 7.3  --   --   --   --   HGB 8.0* 9.1* 8.0* 7.7* 7.7*  HCT 24.2* 27.9* 24.7* 23.8* 23.7*  MCV 100.8* 100.7* 102.1* 103.0* 102.2*  PLT 100* 111* 130* 120* 098*   Basic Metabolic Panel:  Recent Labs Lab 08/21/17 0036 08/21/17 0832 08/22/17 1051 08/23/17 0422 08/24/17 0454  NA 132*  133* 135 137 137 139  K 3.5  3.5 3.3* 3.8 3.5 3.2*  CL 96*  96* 100* 105 105 105  CO2 27  29 23 26 24 28   GLUCOSE 117*  122* 85 85 76 110*  BUN 28*  28* 28* 27* 31* 32*  CREATININE 1.75*  1.74* 1.56* 1.34* 1.14* 1.05*  CALCIUM 12.2*  12.3* 12.8* 13.3* 12.9* 11.6*   Liver Function Tests:  Recent Labs Lab 08/20/17 1931 08/21/17 0832 08/24/17 0454  AST 29  --   --   ALT 32  --   --   ALKPHOS 90  --   --   BILITOT 2.0*  --   --   PROT 5.2*  --   --   ALBUMIN 2.8* 2.0* 1.9*   Coagulation Profile:  Recent Labs Lab 08/20/17 2255  INR 1.16   Cardiac Enzymes: No results for input(s): CKTOTAL, CKMB, CKMBINDEX, TROPONINI in the last 168 hours. HbA1C: No results for input(s): HGBA1C in the last 72 hours. CBG:  Recent Labs Lab 08/21/17 1149 08/22/17 0759 08/23/17 0738 08/23/17 1151  GLUCAP 79 84 84 96    Recent Results (from the past 240 hour(s))  Blood Culture (routine x 2)     Status: None (Preliminary result)   Collection Time: 08/20/17  8:10 PM  Result Value Ref Range Status   Specimen Description BLOOD RIGHT HAND  Final    Special Requests IN PEDIATRIC BOTTLE Blood Culture adequate volume  Final   Culture NO GROWTH 4 DAYS  Final   Report Status PENDING  Incomplete  Blood Culture (routine x 2)     Status: None (Preliminary result)   Collection Time: 08/20/17  8:13 PM  Result Value Ref Range Status   Specimen Description BLOOD RIGHT ANTECUBITAL  Final   Special Requests   Final    BOTTLES DRAWN AEROBIC AND ANAEROBIC Blood Culture adequate volume   Culture NO GROWTH 4 DAYS  Final   Report Status PENDING  Incomplete  Urine Culture     Status: None   Collection Time: 08/20/17  9:15 PM  Result Value Ref Range Status   Specimen Description URINE, CATHETERIZED  Final   Special Requests NONE  Final   Culture NO GROWTH  Final   Report Status 08/22/2017 FINAL  Final  Urine Culture     Status: None   Collection Time: 08/21/17  2:32 PM  Result Value Ref Range Status   Specimen Description URINE, CATHETERIZED  URINE FROM DRAIN PLACEMENT  Final   Special Requests NONE  Final   Culture NO GROWTH  Final   Report Status 08/22/2017 FINAL  Final         Radiology Studies: Ct Chest Wo Contrast  Result Date:  08/23/2017 CLINICAL DATA:  History of dry cough EXAM: CT CHEST WITHOUT CONTRAST TECHNIQUE: Multidetector CT imaging of the chest was performed following the standard protocol without IV contrast. Patient appears to have been scanned in the prone position. COMPARISON:  08/20/2017, 08/23/2017, 08/20/2017 FINDINGS: Cardiovascular: Limited evaluation without intravenous contrast. Atherosclerotic calcifications of the aorta. No aneurysmal dilatation. Borderline heart size. No pericardial effusion. Mediastinum/Nodes: No thyroid mass. Midline trachea. Scattered subcentimeter mediastinal lymph nodes. Limited evaluation for hilar adenopathy. Esophagus within normal limits. Enlarged left supraclavicular lymph node measuring 14 mm. Lungs/Pleura: Hazy dependent atelectasis anteriorly. Mild emphysema in the upper lobes. Mild  bronchiectasis and bandlike consolidation in the posterior lower lobes, possibly scarring. Negative for pneumothorax. Small dependent anterior pleural effusions. Upper Abdomen: Vague hypodense mass in the liver with coarse calcification re- demonstrated. Prominent appearing spleen. Edema within the lateral chest walls inferiorly. Musculoskeletal: Degenerative changes. No definite acute or suspicious bone lesion. IMPRESSION: 1. Mild emphysema and small pleural effusions. Mild posterior lower lobe bronchiectasis with associated consolidation, uncertain if this is chronic consolidation or mild infiltrate. 2. Enlarged left supraclavicular lymph node measuring up to 14 mm 3. Small pleural effusions 4. Visible spleen in the upper abdomen appears enlarged 5. Partially visualized centrally calcified mass in the liver Aortic Atherosclerosis (ICD10-I70.0) and Emphysema (ICD10-J43.9). Electronically Signed   By: Donavan Foil M.D.   On: 08/23/2017 15:34   Ct Biopsy  Result Date: 08/23/2017 INDICATION: 71 year old female with retroperitoneal lymphadenopathy. She presents for biopsy for tissue diagnosis. EXAM: CT BIOPSY MEDICATIONS: None. ANESTHESIA/SEDATION: Moderate (conscious) sedation was employed during this procedure. A total of Versed 1 mg and Fentanyl 50 mcg was administered intravenously. Moderate Sedation Time: 15 minutes. The patient's level of consciousness and vital signs were monitored continuously by radiology nursing throughout the procedure under my direct supervision. FLUOROSCOPY TIME:  Fluoroscopy Time: 0 minutes 0 seconds (0 mGy). COMPLICATIONS: None immediate. PROCEDURE: Informed written consent was obtained from the patient after a thorough discussion of the procedural risks, benefits and alternatives. All questions were addressed. A timeout was performed prior to the initiation of the procedure. The patient was placed prone on the CT gantry. A planning axial CT scan was performed. A left para-aortic  retroperitoneal lymph node was successfully identified. The overlying skin was marked and then sterilely prepped and draped in standard fashion using chlorhexidine skin prep. Local anesthesia was attained by infiltration with 1% lidocaine. A small dermatotomy was made. Under intermittent CT guidance, a 17 gauge introducer needle was advanced through the left paraspinal musculature and positioned at the margin of the retroperitoneal lymph node. 18 gauge core biopsies were then coaxially obtained. Biopsy specimens were placed in saline and delivered to pathology for further analysis. The introducer needle was removed. Post biopsy axial CT imaging demonstrates no evidence of immediate complication. The patient tolerated the procedure well. IMPRESSION: Successful core biopsy of left periaortic retroperitoneal lymph node. Of note, the lymph node is very deep and only 1 core could be obtained. Signed, Criselda Peaches, MD [facility] Electronically Signed   By: Jacqulynn Cadet M.D.   On: 08/23/2017 17:33        Scheduled Meds: . allopurinol  300 mg Oral Daily  . calcitonin  4 Units/kg Intramuscular BID  . dextromethorphan-guaiFENesin  1 tablet Oral BID  . lidocaine (PF)      . [START ON 08/27/2017] methotrexate  17.5 mg Oral Weekly  . simvastatin  20 mg Oral Daily   Continuous Infusions: .  sodium chloride 125 mL/hr at 08/24/17 1100     LOS: 4 days     HONGALGI,ANAND, MD, FACP, FHM. [facility] Pager [phone]  If 7PM-7AM, please contact night-coverage [email or web address] Password [facility] 08/24/2017, 4:42 PM

## 2017-08-24 NOTE — Progress Notes (Signed)
Addendum  After had just put in my progress note, patient's RN paged to indicate that patient was not febrile with a temperature of 101.12F. Unclear source. As stated in previous note, CT chest had shown bronchiectasis with concern for chronic or acute infiltrate. We will get blood cultures, urine culture from nephrostomy tube and start empirically on vancomycin and Zosyn. Discussed with RN.  Vernell Leep, MD, FACP, FHM. [facility] Pager [phone]  If 7PM-7AM, please contact night-coverage [email or web address] Password [email or web address] 08/24/2017, 5:07 PM

## 2017-08-24 NOTE — Progress Notes (Signed)
Referring Physician(s): Dr Ky Barban  Supervising Physician: Jacqulynn Cadet  Patient Status:  [facility] - In-pt  Chief Complaint:  LAN Rt hydronephrosis Weakness; fatigue; wt loss  Subjective:  LAN---L retroperitoneal LN bx 9/6 R Hydronephrosis---PCN placed 08/21/17  Dr Burr Medico calls and asking for additional samples for further diagnosis and treatment plans  Allergies: Streptomycin  Medications: Prior to Admission medications   Medication Sig Start Date End Date Taking? Authorizing Provider  allopurinol (ZYLOPRIM) 300 MG tablet Take 300 mg by mouth daily. 05/19/17  Yes [provider]  Calcium Carb-Cholecalciferol (CALCIUM 1000 + D PO) Take 1 tablet by mouth daily.   Yes [provider]  ciprofloxacin (CIPRO) 250 MG tablet Take 250 mg by mouth 2 (two) times daily. 08/15/17  Yes [provider]  hydrochlorothiazide (MICROZIDE) 12.5 MG capsule Take 12.5 mg by mouth daily.  07/19/17  Yes [provider]  methotrexate (RHEUMATREX) 2.5 MG tablet Take 17.5 mg by mouth once a week. Take 7 tabs (2.5mg  tablets) qMonday 05/20/17  Yes [provider]  ondansetron (ZOFRAN-ODT) 4 MG disintegrating tablet Take 4 mg by mouth as needed. 08/14/17  Yes [provider]  simvastatin (ZOCOR) 20 MG tablet Take 20 mg by mouth daily. 08/17/17  Yes [provider]     Vital Signs: BP (!) 93/49 (BP Location: Right Arm)   Pulse 75   Temp (!) 97.5 F (36.4 C) (Oral)   Resp 18   Ht 4' 10.5" (1.486 m)   Wt 200 lb 6.4 oz (90.9 kg)   SpO2 92%   BMI 41.17 kg/m   Physical Exam  Cardiovascular: Normal rate and regular rhythm.   Pulmonary/Chest: Effort normal.  Abdominal: Soft.  Musculoskeletal: Normal range of motion.  Neurological: She is alert.  Confusion is minimal; but thoughts are jumbled  Skin: Skin is warm and dry.  Psychiatric: She has a normal mood and affect.  Pt is confused this am Consented husband at bedside  Nursing note and  vitals reviewed.   Imaging: Ct Abdomen Pelvis Wo Contrast  Result Date: 08/20/2017 CLINICAL DATA:  Abdominal pain EXAM: CT ABDOMEN AND PELVIS WITHOUT CONTRAST TECHNIQUE: Multidetector CT imaging of the abdomen and pelvis was performed following the standard protocol without IV contrast. COMPARISON:  MRI 02/10/2010 FINDINGS: Lower chest: Lung bases demonstrate no acute consolidation or pleural effusion. The heart size is within normal limits. Hepatobiliary: Vague hypodense mass with central calcification in the right hepatic lobe measuring at least 8.5 cm and presumably corresponding to the 2011 demonstrated MRI mass/ thought than to represent hemangioma. No calcified gallstones. No biliary dilatation Pancreas: Unremarkable. No pancreatic ductal dilatation or surrounding inflammatory changes. Spleen: Enlarged, measuring 14 cm Adrenals/Urinary Tract: Left adrenal gland is within normal limits. Possible cyst in the mid to lower pole left kidney. Severe hydronephrosis and hydroureter of the right kidney with distal tapering of the ureter. Bladder within normal limits. No stones are seen Stomach/Bowel: The stomach is nonenlarged. There is no dilated small bowel. No colon wall thickening. Vascular/Lymphatic: Aortic atherosclerosis. Multiple enlarged periaortic/retroperitoneal lymph nodes, measuring up to 2 cm. Right greater than left iliac adenopathy. Reproductive: Calcified uterine fibroids.  No adnexal masses. Other: Negative for free air or free fluid. Musculoskeletal: Degenerative changes. No acute or suspicious bone lesion IMPRESSION: 1. Vague hypodense mass with central calcification in the right hepatic lobe presumably corresponding to the MRI mass from 2011 which was thought to represent hemangioma 2. Moderate retroperitoneal somewhat bulky adenopathy. Splenomegaly. Findings raise concern for  metastatic disease or lymphoproliferative abnormality such as lymphoma. Clinical correlation is recommended 3.  Negative for a bowel obstruction or colon wall thickening 4. Severe right hydronephrosis and hydroureter with distal tapering of the ureter. No definitive stones are seen, findings could be secondary to a stricture 5. Calcified uterine fibroids Electronically Signed   By: Donavan Foil M.D.   On: 08/20/2017 23:55   Dg Chest 2 View  Result Date: 08/20/2017 CLINICAL DATA:  Weakness nausea and vomiting EXAM: CHEST  2 VIEW COMPARISON:  None. FINDINGS: The heart size and mediastinal contours are within normal limits. Both lungs are clear. Degenerative changes of the spine. IMPRESSION: No active cardiopulmonary disease. Electronically Signed   By: Donavan Foil M.D.   On: 08/20/2017 19:42   Ct Chest Wo Contrast  Result Date: 08/23/2017 CLINICAL DATA:  History of dry cough EXAM: CT CHEST WITHOUT CONTRAST TECHNIQUE: Multidetector CT imaging of the chest was performed following the standard protocol without IV contrast. Patient appears to have been scanned in the prone position. COMPARISON:  08/20/2017, 08/23/2017, 08/20/2017 FINDINGS: Cardiovascular: Limited evaluation without intravenous contrast. Atherosclerotic calcifications of the aorta. No aneurysmal dilatation. Borderline heart size. No pericardial effusion. Mediastinum/Nodes: No thyroid mass. Midline trachea. Scattered subcentimeter mediastinal lymph nodes. Limited evaluation for hilar adenopathy. Esophagus within normal limits. Enlarged left supraclavicular lymph node measuring 14 mm. Lungs/Pleura: Hazy dependent atelectasis anteriorly. Mild emphysema in the upper lobes. Mild bronchiectasis and bandlike consolidation in the posterior lower lobes, possibly scarring. Negative for pneumothorax. Small dependent anterior pleural effusions. Upper Abdomen: Vague hypodense mass in the liver with coarse calcification re- demonstrated. Prominent appearing spleen. Edema within the lateral chest walls inferiorly. Musculoskeletal: Degenerative changes. No definite acute  or suspicious bone lesion. IMPRESSION: 1. Mild emphysema and small pleural effusions. Mild posterior lower lobe bronchiectasis with associated consolidation, uncertain if this is chronic consolidation or mild infiltrate. 2. Enlarged left supraclavicular lymph node measuring up to 14 mm 3. Small pleural effusions 4. Visible spleen in the upper abdomen appears enlarged 5. Partially visualized centrally calcified mass in the liver Aortic Atherosclerosis (ICD10-I70.0) and Emphysema (ICD10-J43.9). Electronically Signed   By: Donavan Foil M.D.   On: 08/23/2017 15:34   Ct Biopsy  Result Date: 08/23/2017 INDICATION: 71 year old female with retroperitoneal lymphadenopathy. She presents for biopsy for tissue diagnosis. EXAM: CT BIOPSY MEDICATIONS: None. ANESTHESIA/SEDATION: Moderate (conscious) sedation was employed during this procedure. A total of Versed 1 mg and Fentanyl 50 mcg was administered intravenously. Moderate Sedation Time: 15 minutes. The patient's level of consciousness and vital signs were monitored continuously by radiology nursing throughout the procedure under my direct supervision. FLUOROSCOPY TIME:  Fluoroscopy Time: 0 minutes 0 seconds (0 mGy). COMPLICATIONS: None immediate. PROCEDURE: Informed written consent was obtained from the patient after a thorough discussion of the procedural risks, benefits and alternatives. All questions were addressed. A timeout was performed prior to the initiation of the procedure. The patient was placed prone on the CT gantry. A planning axial CT scan was performed. A left para-aortic retroperitoneal lymph node was successfully identified. The overlying skin was marked and then sterilely prepped and draped in standard fashion using chlorhexidine skin prep. Local anesthesia was attained by infiltration with 1% lidocaine. A small dermatotomy was made. Under intermittent CT guidance, a 17 gauge introducer needle was advanced through the left paraspinal musculature and  positioned at the margin of the retroperitoneal lymph node. 18 gauge core biopsies were then coaxially obtained. Biopsy specimens were placed in saline and delivered to pathology for  further analysis. The introducer needle was removed. Post biopsy axial CT imaging demonstrates no evidence of immediate complication. The patient tolerated the procedure well. IMPRESSION: Successful core biopsy of left periaortic retroperitoneal lymph node. Of note, the lymph node is very deep and only 1 core could be obtained. Signed, Criselda Peaches, MD [facility] Electronically Signed   By: Jacqulynn Cadet M.D.   On: 08/23/2017 17:33   Ir Nephrostomy Placement Right  Result Date: 08/21/2017 INDICATION: Right ureteral obstruction EXAM: PERCUTANEOUS RIGHT NEPHROSTOMY PLACEMENT COMPARISON:  None. MEDICATIONS: Ciprofloxacin 400 mg IV; The antibiotic was administered in an appropriate time frame prior to skin puncture. ANESTHESIA/SEDATION: Fentanyl 50 mcg IV; Versed 1 mg IV Moderate Sedation Time:  18 The patient was continuously monitored during the procedure by the interventional radiology nurse under my direct supervision. CONTRAST:  10 cc Isovue-300 - administered into the collecting system(s) FLUOROSCOPY TIME:  Fluoroscopy Time: 1 minutes  seconds (3 mGy). COMPLICATIONS: None immediate. PROCEDURE: Informed written consent was obtained from the patient after a thorough discussion of the procedural risks, benefits and alternatives. All questions were addressed. Maximal Sterile Barrier Technique was utilized including caps, mask, sterile gowns, sterile gloves, sterile drape, hand hygiene and skin antiseptic. A timeout was performed prior to the initiation of the procedure. The back was prepped and draped in a sterile fashion. Under sonographic guidance, a 21 gauge needle was inserted into a posterior lower pole calyx and removed over a 018 wire. This was up sized to an  Amplatz. Ten [place] dilator followed by a 10 [place] drain were inserted. It was looped and string fixed in the renal pelvis. Contrast was injected. A sample of urine was sent for culture. FINDINGS: Imaging documents placement of a 10 French right percutaneous nephrostomy catheter into the renal pelvis. IMPRESSION: Successful right percutaneous nephrostomy catheter placement. Electronically Signed   By: Marybelle Killings M.D.   On: 08/21/2017 14:48    Labs:  CBC:  Recent Labs  08/21/17 0233 08/22/17 1051 08/23/17 0422 08/24/17 0454  WBC 15.9* 14.0* 14.3* 14.5*  HGB 9.1* 8.0* 7.7* 7.7*  HCT 27.9* 24.7* 23.8* 23.7*  PLT 111* 130* 120* 120*    COAGS:  Recent Labs  08/20/17 2255  INR 1.16  APTT 24    BMP:  Recent Labs  08/21/17 0832 08/22/17 1051 08/23/17 0422 08/24/17 0454  NA 135 137 137 139  K 3.3* 3.8 3.5 3.2*  CL 100* 105 105 105  CO2 23 26 24 28   GLUCOSE 85 85 76 110*  BUN 28* 27* 31* 32*  CALCIUM 12.8* 13.3* 12.9* 11.6*  CREATININE 1.56* 1.34* 1.14* 1.05*  GFRNONAA 32* 39* 47* 52*  GFRAA 37* 45* 55* >60    LIVER FUNCTION TESTS:  Recent Labs  08/20/17 1931 08/21/17 0832 08/24/17 0454  BILITOT 2.0*  --   --   AST 29  --   --   ALT 32  --   --   ALKPHOS 90  --   --   PROT 5.2*  --   --   ALBUMIN 2.8* 2.0* 1.9*    Assessment and Plan:  Left RP LN bx 08/23/2017 Need for additonal samples for diagnosis and treatment planning Risks and benefits discussed with the patient including, but not limited to bleeding, infection, damage to adjacent structures or low yield requiring additional tests. All of the patient's questions were answered, patient is agreeable to proceed. Consent signed and in chart.   Electronically Signed:  Abhishek Levesque A, PA-C 08/24/2017, 10:08 AM   I spent a total of  20 minutesat the the patient's bedside AND on the patient's hospital floor or unit, greater than 50% of which was counseling/coordinating care for LAN bx

## 2017-08-24 NOTE — Progress Notes (Signed)
Pt's urostomy was flushed with 7ml of normal saline. Line is patent and ns saline returns observed with no active bleeding noted. Bleeding noted from the biopsy noted as well. Will cont to monitor

## 2017-08-24 NOTE — Care Management Important Message (Signed)
Important Message  Patient Details  Name: Kristina Torres MRN: [id number] Date of Birth: 11-04-1946   Medicare Important Message Given:  Yes    Kaili Castille Abena 08/24/2017, 9:10 AM

## 2017-08-24 NOTE — Progress Notes (Signed)
Report given to Caitlyn and bedside rounding done. No significant changes noted

## 2017-08-24 NOTE — Progress Notes (Signed)
Kristina Torres   DOB:[birth year]   ZO#:[id number]   JWJ#:[id number]  Hematology and Oncology service follow up note   Subjective: Pt's calcium has come down to 11.6 this morning, more alert this morning. She underwent core biopsy of left [place] node by IR in late afternoon, per my request. She developed fever after the biopsy. Antibiotics started, cultures sent  Objective:  Vitals:   08/24/17 1610 08/24/17 1616  BP: 109/62 101/65  Pulse: 90 90  Resp: (!) 31 (!) 24  Temp:    SpO2: 96% 95%    Body mass index is 41.17 kg/m.  Intake/Output Summary (Last 24 hours) at 08/24/17 2015 Last data filed at 08/24/17 1836  Gross per 24 hour  Intake             2220 ml  Output             2600 ml  Net             -380 ml     Sclerae unicteric  Oropharynx clear  No peripheral adenopathy  Lungs clear -- no rales or rhonchi  Heart regular rate and rhythm  Abdomen benign  MSK no focal spinal tenderness, no peripheral edema  Neuro nonfocal, somnolent    CBG (last 3)   Recent Labs  08/22/17 0759 08/23/17 0738 08/23/17 1151  GLUCAP 84 84 96     Labs:  Urine Studies No results for input(s): UHGB, CRYS in the last 72 hours.  Invalid input(s): UACOL, UAPR, USPG, UPH, UTP, UGL, UKET, UBIL, UNIT, UROB, [place], UEPI, UWBC, [place], [place], [place], [place], [place]  Basic Metabolic Panel:  Recent Labs Lab 08/21/17 0036 08/21/17 0832 08/22/17 1051 08/23/17 0422 08/24/17 0454  NA 132*  133* 135 137 137 139  K 3.5  3.5 3.3* 3.8 3.5 3.2*  CL 96*  96* 100* 105 105 105  CO2 27  29 23 26 24 28   GLUCOSE 117*  122* 85 85 76 110*  BUN 28*  28* 28* 27* 31* 32*  CREATININE 1.75*  1.74* 1.56* 1.34* 1.14* 1.05*  CALCIUM 12.2*  12.3* 12.8* 13.3* 12.9* 11.6*   GFR Estimated Creatinine Clearance: 47.8 mL/min (A) (by C-G formula based on SCr of 1.05 mg/dL (H)). Liver Function Tests:  Recent Labs Lab 08/20/17 1931 08/21/17 0832 08/24/17 0454  AST 29  --   --   ALT 32  --   --   ALKPHOS 90  --    --   BILITOT 2.0*  --   --   PROT 5.2*  --   --   ALBUMIN 2.8* 2.0* 1.9*    Recent Labs Lab 08/20/17 2255  LIPASE 22   No results for input(s): AMMONIA in the last 168 hours. Coagulation profile  Recent Labs Lab 08/20/17 2255  INR 1.16    CBC:  Recent Labs Lab 08/21/17 0036 08/21/17 0233 08/22/17 1051 08/23/17 0422 08/24/17 0454  WBC 15.1* 15.9* 14.0* 14.3* 14.5*  NEUTROABS 7.3  --   --   --   --   HGB 8.0* 9.1* 8.0* 7.7* 7.7*  HCT 24.2* 27.9* 24.7* 23.8* 23.7*  MCV 100.8* 100.7* 102.1* 103.0* 102.2*  PLT 100* 111* 130* 120* 120*   Cardiac Enzymes: No results for input(s): CKTOTAL, CKMB, CKMBINDEX, TROPONINI in the last 168 hours. BNP: Invalid input(s): POCBNP CBG:  Recent Labs Lab 08/21/17 1149 08/22/17 0759 08/23/17 0738 08/23/17 1151  GLUCAP 79 84 84 96   D-Dimer No results for input(s): DDIMER in the  last 72 hours. Hgb A1c No results for input(s): HGBA1C in the last 72 hours. Lipid Profile No results for input(s): CHOL, HDL, LDLCALC, TRIG, CHOLHDL, LDLDIRECT in the last 72 hours. Thyroid function studies No results for input(s): TSH, T4TOTAL, T3FREE, THYROIDAB in the last 72 hours.  Invalid input(s): FREET3 Anemia work up No results for input(s): VITAMINB12, FOLATE, FERRITIN, TIBC, IRON, RETICCTPCT in the last 72 hours. Microbiology Recent Results (from the past 240 hour(s))  Blood Culture (routine x 2)     Status: None (Preliminary result)   Collection Time: 08/20/17  8:10 PM  Result Value Ref Range Status   Specimen Description BLOOD RIGHT HAND  Final   Special Requests IN PEDIATRIC BOTTLE Blood Culture adequate volume  Final   Culture NO GROWTH 4 DAYS  Final   Report Status PENDING  Incomplete  Blood Culture (routine x 2)     Status: None (Preliminary result)   Collection Time: 08/20/17  8:13 PM  Result Value Ref Range Status   Specimen Description BLOOD RIGHT ANTECUBITAL  Final   Special Requests   Final    BOTTLES DRAWN AEROBIC AND  ANAEROBIC Blood Culture adequate volume   Culture NO GROWTH 4 DAYS  Final   Report Status PENDING  Incomplete  Urine Culture     Status: None   Collection Time: 08/20/17  9:15 PM  Result Value Ref Range Status   Specimen Description URINE, CATHETERIZED  Final   Special Requests NONE  Final   Culture NO GROWTH  Final   Report Status 08/22/2017 FINAL  Final  Urine Culture     Status: None   Collection Time: 08/21/17  2:32 PM  Result Value Ref Range Status   Specimen Description URINE, CATHETERIZED  URINE FROM DRAIN PLACEMENT  Final   Special Requests NONE  Final   Culture NO GROWTH  Final   Report Status 08/22/2017 FINAL  Final  Culture, blood (Routine X 2) w Reflex to ID Panel     Status: None (Preliminary result)   Collection Time: 08/24/17  6:14 PM  Result Value Ref Range Status   Specimen Description BLOOD RIGHT HAND  Final   Special Requests   Final    BOTTLES DRAWN AEROBIC AND ANAEROBIC Blood Culture adequate volume   Culture PENDING  Incomplete   Report Status PENDING  Incomplete      Studies:  Ct Chest Wo Contrast  Result Date: 08/23/2017 CLINICAL DATA:  History of dry cough EXAM: CT CHEST WITHOUT CONTRAST TECHNIQUE: Multidetector CT imaging of the chest was performed following the standard protocol without IV contrast. Patient appears to have been scanned in the prone position. COMPARISON:  08/20/2017, 08/23/2017, 08/20/2017 FINDINGS: Cardiovascular: Limited evaluation without intravenous contrast. Atherosclerotic calcifications of the aorta. No aneurysmal dilatation. Borderline heart size. No pericardial effusion. Mediastinum/Nodes: No thyroid mass. Midline trachea. Scattered subcentimeter mediastinal lymph nodes. Limited evaluation for hilar adenopathy. Esophagus within normal limits. Enlarged left supraclavicular lymph node measuring 14 mm. Lungs/Pleura: Hazy dependent atelectasis anteriorly. Mild emphysema in the upper lobes. Mild bronchiectasis and bandlike consolidation in  the posterior lower lobes, possibly scarring. Negative for pneumothorax. Small dependent anterior pleural effusions. Upper Abdomen: Vague hypodense mass in the liver with coarse calcification re- demonstrated. Prominent appearing spleen. Edema within the lateral chest walls inferiorly. Musculoskeletal: Degenerative changes. No definite acute or suspicious bone lesion. IMPRESSION: 1. Mild emphysema and small pleural effusions. Mild posterior lower lobe bronchiectasis with associated consolidation, uncertain if this is chronic consolidation or mild infiltrate. 2.  Enlarged left supraclavicular lymph node measuring up to 14 mm 3. Small pleural effusions 4. Visible spleen in the upper abdomen appears enlarged 5. Partially visualized centrally calcified mass in the liver Aortic Atherosclerosis (ICD10-I70.0) and Emphysema (ICD10-J43.9). Electronically Signed   By: Donavan Foil M.D.   On: 08/23/2017 15:34   US Guided Needle Placement  Result Date: 08/24/2017 INDICATION: 71 year old female with high-grade lymphoma of uncertain type. Repeat biopsy is requested to facilitate flow cytometry and ultimate diagnosis. EXAM: Ultrasound-guided biopsy left supraclavicular lymph node MEDICATIONS: None. ANESTHESIA/SEDATION: None FLUOROSCOPY TIME:  Fluoroscopy Time: 0 minutes 0 seconds (0 mGy). COMPLICATIONS: None immediate. PROCEDURE: Informed written consent was obtained from the patient after a thorough discussion of the procedural risks, benefits and alternatives. All questions were addressed. A timeout was performed prior to the initiation of the procedure. The left neck was interrogated with ultrasound. There are multiple prominent hypoechoic lymph nodes. The patient's breathing is hyperdynamic. The lymph nodes are in close proximity to the carotid artery. The region was sterilely prepped and draped in standard fashion using chlorhexidine skin prep. Local anesthesia was attained by infiltration with 1% lidocaine. A small  dermatotomy was made. Under real-time sonographic guidance, multiple 18 gauge core biopsies were carefully obtained using a a [employer] automated biopsy device. The course the needle was identified on all needle passes confirming that biopsy specimens were obtained of the lymph nodes. There is no evidence of immediate complication. IMPRESSION: Technically successful ultrasound-guided core biopsy of left supraclavicular adenopathy. Of note, the biopsy cores were fragmented and gelatinous. Numerous biopsy passes were obtained in an attempt to acquire enough material for testing. Electronically Signed   By: Jacqulynn Cadet M.D.   On: 08/24/2017 17:09   Ct Biopsy  Result Date: 08/23/2017 INDICATION: 71 year old female with retroperitoneal lymphadenopathy. She presents for biopsy for tissue diagnosis. EXAM: CT BIOPSY MEDICATIONS: None. ANESTHESIA/SEDATION: Moderate (conscious) sedation was employed during this procedure. A total of Versed 1 mg and Fentanyl 50 mcg was administered intravenously. Moderate Sedation Time: 15 minutes. The patient's level of consciousness and vital signs were monitored continuously by radiology nursing throughout the procedure under my direct supervision. FLUOROSCOPY TIME:  Fluoroscopy Time: 0 minutes 0 seconds (0 mGy). COMPLICATIONS: None immediate. PROCEDURE: Informed written consent was obtained from the patient after a thorough discussion of the procedural risks, benefits and alternatives. All questions were addressed. A timeout was performed prior to the initiation of the procedure. The patient was placed prone on the CT gantry. A planning axial CT scan was performed. A left para-aortic retroperitoneal lymph node was successfully identified. The overlying skin was marked and then sterilely prepped and draped in standard fashion using chlorhexidine skin prep. Local anesthesia was attained by infiltration with 1% lidocaine. A small dermatotomy was made. Under intermittent CT  guidance, a 17 gauge introducer needle was advanced through the left paraspinal musculature and positioned at the margin of the retroperitoneal lymph node. 18 gauge core biopsies were then coaxially obtained. Biopsy specimens were placed in saline and delivered to pathology for further analysis. The introducer needle was removed. Post biopsy axial CT imaging demonstrates no evidence of immediate complication. The patient tolerated the procedure well. IMPRESSION: Successful core biopsy of left periaortic retroperitoneal lymph node. Of note, the lymph node is very deep and only 1 core could be obtained. Signed, Criselda Peaches, MD [facility] Rad[facility] Electronically Signed   By: Jacqulynn Cadet M.D.   On: 08/23/2017 17:33    Assessment:  Kristina Torres is a 71 y.o. female with a history of HTN, gout, HLD and Rheumatoid Arthritis.   1. Liver Mass in Right hepatic Lobe and PR adenopathy , rule out lymphoma or malignancy 2. Macrocytic anemia, likely secondary to MTX and anemia of chronic disease, worsening  3. Mild thrombocytopenia, possibly related to MTX, or autoimmune mediated 4. RA 5. HTN  6. Right hydronephrosis secondary to ureteral obstruction, status post nephrostomy tube placement 7. Severe hypercalcemia, improved  8. Nausea and vomiting, anorexia  9. Metabolic encephalopathy, likely secondary to hypercalcemia, improved 10. Fever 08/24/2017  Plan:  -her PR node biopsy was reviewed by pathologist Dr. Berdine Dance and Smir today, and I spoke to both of them. Although lymphoma is suspected, Dr. Gari Crown feels there is insufficient tissue to make the diagnosis and obtain further tests such as flow cytometry, he also feels this could be  lymphoproliferative disorder secondary to methotrexate or rheumatoid arthritis, which could be difficult to diagnosis sometime.  -I have spoken with IR Dr. Laurence Ferrari and he agrees with second core needle biopsy of her  left [place] node for tissue which was done later this afternoon. I will review the pathology with Dr. Gari Crown on Monday  -I have updated the above with pt's husband and sister today -I agree with antibiotics and ID work up for her fever today  -I will follow up on Monday, Dr. Julien Nordmann is on call this weekend, please call if needed.   Truitt Merle, MD 08/24/2017  8:15 PM

## 2017-08-24 NOTE — Progress Notes (Signed)
Pharmacy Antibiotic Note  Kristina Torres is a 71 y.o. female admitted on 08/20/2017 with general complaints of n/v, lightheadedness.  Pharmacy has been consulted for empiric vancomycin and zosyn dosing.  Patient recently on ceftriaxone for possible UTI but discontinued after negative cultures. Patient now febrile to 101.1, unclear source. Possible infiltrate on CXR. Planning blood and urine cultures.  Plan: Vancomycin 2000mg  IV now then 750mg  IV every 12  hours.  Goal trough 15-20 mcg/mL. Zosyn 3.375g IV q8h (4 hour infusion).  Height: 4' 10.5" (148.6 cm) Weight: 200 lb 6.4 oz (90.9 kg) IBW/kg (Calculated) : 42.05  Temp (24hrs), Avg:97.9 F (36.6 C), Min:97.5 F (36.4 C), Max:98.2 F (36.8 C)   Recent Labs Lab 08/20/17 1941 08/20/17 2251 08/20/17 2306 08/21/17 0036 08/21/17 0233 08/21/17 0832 08/21/17 1636 08/22/17 1051 08/23/17 0422 08/24/17 0454  WBC  --   --   --  15.1* 15.9*  --   --  14.0* 14.3* 14.5*  CREATININE  --   --   --  1.75*  1.74*  --  1.56*  --  1.34* 1.14* 1.05*  LATICACIDVEN 2.70* 2.4* 2.32*  --  2.2*  --  2.1*  --   --   --     Estimated Creatinine Clearance: 47.8 mL/min (A) (by C-G formula based on SCr of 1.05 mg/dL (H)).    Allergies  Allergen Reactions  . Streptomycin Anaphylaxis    Thank you for allowing pharmacy to be a part of this patient's care.  Erin Hearing PharmD., BCPS Clinical Pharmacist Pager [phone] 08/24/2017 5:19 PM

## 2017-08-24 NOTE — Progress Notes (Signed)
Physical Therapy Treatment Patient Details Name: Kristina Torres MRN: [id number] DOB: 06-16-1946 Today's Date: 08/24/2017    History of Present Illness 71 year old married female, PMH of HTN, HLD, gout, rheumatoid arthritis and renal mass presented to ED on 08/20/17 with complaints of nausea, nonbloody emesis, RUQ abdominal pain, lightheadedness, generalized weakness and cough.  Found to have UTI with renal blockage due to mass.  Urology consulted. IR placed right percutaneous nephrostomy.    PT Comments    Patient limited today by pain and lethargy.  Patient did agree to and performed LE exercises.  Will return at later date.   Follow Up Recommendations  SNF;Supervision/Assistance - 24 hour     Equipment Recommendations  Rolling walker with 5" wheels    Recommendations for Other Services       Precautions / Restrictions Precautions Precautions: Fall Restrictions Weight Bearing Restrictions: No    Mobility  Bed Mobility               General bed mobility comments: Declined any mobility.  Transfers                    Ambulation/Gait                 Stairs            Wheelchair Mobility    Modified Rankin (Stroke Patients Only)       Balance                                            Cognition Arousal/Alertness: Lethargic Behavior During Therapy: Flat affect Overall Cognitive Status: Impaired/Different from baseline Area of Impairment: Orientation;Attention;Following commands;Problem solving                 Orientation Level: Disoriented to;Place;Time Current Attention Level: Sustained   Following Commands: Follows one step commands inconsistently     Problem Solving: Slow processing;Decreased initiation;Requires verbal cues;Requires tactile cues General Comments: Patient has had biopsies today.  Fatigued and lethargic.      Exercises General Exercises - Lower Extremity Ankle Circles/Pumps:  AROM;Both;10 reps;Supine;Other (comment) (Performed PROM to bil ankles and forefoot) Heel Slides: AAROM;Both;Supine;Limitations Heel Slides Limitations: Able to attempt 1-2 reps on each side.  Unable due to pain. Hip ABduction/ADduction: AAROM;Both;5 reps;Supine;Limitations Hip Abduction/Adduction Limitations: Limited by pain.  Performed passive hip rotation for stretching with knees in extension.    General Comments        Pertinent Vitals/Pain Pain Assessment: Faces Faces Pain Scale: Hurts whole lot Pain Location: "All over" Pain Descriptors / Indicators: Aching;Sore Pain Intervention(s): Limited activity within patient's tolerance;Monitored during session;Repositioned    Home Living                      Prior Function            PT Goals (current goals can now be found in the care plan section) Progress towards PT goals: Not progressing toward goals - comment (Due to pain)    Frequency    Min 3X/week      PT Plan Current plan remains appropriate    Co-evaluation              AM-PAC PT "6 Clicks" Daily Activity  Outcome Measure  Difficulty turning over in bed (including adjusting bedclothes, sheets and blankets)?: Unable Difficulty moving from lying on back to  sitting on the side of the bed? : Unable Difficulty sitting down on and standing up from a chair with arms (e.g., wheelchair, bedside commode, etc,.)?: Unable Help needed moving to and from a bed to chair (including a wheelchair)?: A Lot Help needed walking in hospital room?: Total Help needed climbing 3-5 steps with a railing? : Total 6 Click Score: 7    End of Session   Activity Tolerance: Patient limited by pain;Patient limited by lethargy Patient left: in bed;with call bell/phone within reach;with family/visitor present   PT Visit Diagnosis: Other abnormalities of gait and mobility (R26.89);Unsteadiness on feet (R26.81);Muscle weakness (generalized) (M62.81);Pain Pain - part of body:   (Generalized)     Time: 3729-0211 PT Time Calculation (min) (ACUTE ONLY): 14 min  Charges:  $Therapeutic Exercise: 8-22 mins                    G Codes:       Carita Pian. Sanjuana Kava, [facility] Acute Rehab Services Pager [place] 08/24/2017, 7:38 PM

## 2017-08-24 NOTE — Sedation Documentation (Signed)
NO SEDATION MEDICATIONS GIVEN DURING PROCEDURE.

## 2017-08-24 NOTE — Sedation Documentation (Signed)
Pt very sleepy.  Will continue to monitor the need for sedation medications.

## 2017-08-24 NOTE — Progress Notes (Signed)
Initial Nutrition Assessment  DOCUMENTATION CODES:   Morbid obesity  INTERVENTION:  - Once diet is advanced, provide medically appropriate nutritional supplements  NUTRITION DIAGNOSIS:   Inadequate oral intake related to poor appetite as evidenced by per patient/family report.  GOAL:   Patient will meet greater than or equal to 90% of their needs  MONITOR:   PO intake, Supplement acceptance, Weight trends, I & O's  REASON FOR ASSESSMENT:   Consult Assessment of nutrition requirement/status  ASSESSMENT:   Pt with PMH of arthritis, gout, essential HTN, HLD presents with N/V   Spoke with pt's family at bedside. Pt actively vomiting at time of visit.   Pt's husband reports that pt has had recent weight loss of 15 lbs. However, reported UBW is 165-175 lbs. Pt is current weight is 200 lbs. Per chart review, no recent weight history is available.   Pt's husband reports decreased PO intake for the past week, stating she has only consuming bites. Per family report, pt got a few bites and all liquids down this morning. Per chart review, pt is consuming 0-80% of meals.  Pt's family brought in Boost supplements for pt while admitted. RD discussed providing nutritional supplements while pt admitted once diet is advanced.  Unable to complete nutrition focused physical exam at this time.  Labs reviewed; Potassium 3.2, Hemoglobin 7.7, Albumin 1.9 Medications reviewed; Zofran  Diet Order:  Diet NPO time specified Except for: Sips with Meds  Skin:  Reviewed, no issues  Last BM:  08/22/17  Height:   Ht Readings from Last 1 Encounters:  08/20/17 4' 10.5" (1.486 m)    Weight:   Wt Readings from Last 1 Encounters:  08/23/17 200 lb 6.4 oz (90.9 kg)    Ideal Body Weight:  44 kg  BMI:  Body mass index is 41.17 kg/m.  Estimated Nutritional Needs:   Kcal:  2000-2200  Protein:  110-120 grams  Fluid:  >/= 2 L/d  EDUCATION NEEDS:   Education needs no appropriate at this  time  [place] Ranger, MS, RDN, LDN 08/24/2017 3:07 PM

## 2017-08-25 ENCOUNTER — Inpatient Hospital Stay (HOSPITAL_COMMUNITY): Payer: Medicare Other

## 2017-08-25 DIAGNOSIS — R630 Anorexia: Secondary | ICD-10-CM

## 2017-08-25 DIAGNOSIS — R634 Abnormal weight loss: Secondary | ICD-10-CM

## 2017-08-25 DIAGNOSIS — E860 Dehydration: Secondary | ICD-10-CM

## 2017-08-25 DIAGNOSIS — J189 Pneumonia, unspecified organism: Secondary | ICD-10-CM

## 2017-08-25 DIAGNOSIS — J9601 Acute respiratory failure with hypoxia: Secondary | ICD-10-CM

## 2017-08-25 LAB — ALBUMIN: Albumin: 2 g/dL — ABNORMAL LOW (ref 3.5–5.0)

## 2017-08-25 LAB — BASIC METABOLIC PANEL
ANION GAP: 7 (ref 5–15)
BUN: 31 mg/dL — ABNORMAL HIGH (ref 6–20)
CHLORIDE: 108 mmol/L (ref 101–111)
CO2: 27 mmol/L (ref 22–32)
Calcium: 10.4 mg/dL — ABNORMAL HIGH (ref 8.9–10.3)
Creatinine, Ser: 1.01 mg/dL — ABNORMAL HIGH (ref 0.44–1.00)
GFR calc non Af Amer: 55 mL/min — ABNORMAL LOW (ref >60)
Glucose, Bld: 107 mg/dL — ABNORMAL HIGH (ref 65–99)
Potassium: 3.2 mmol/L — ABNORMAL LOW (ref 3.5–5.1)
Sodium: 142 mmol/L (ref 135–145)

## 2017-08-25 LAB — CBC
HCT: 27.1 % — ABNORMAL LOW (ref 36.0–46.0)
Hemoglobin: 8.6 g/dL — ABNORMAL LOW (ref 12.0–15.0)
MCH: 32.7 pg (ref 26.0–34.0)
MCHC: 31.7 g/dL (ref 30.0–36.0)
MCV: 103 fL — AB (ref 78.0–100.0)
Platelets: 113 10*3/uL — ABNORMAL LOW (ref 150–400)
RBC: 2.63 MIL/uL — AB (ref 3.87–5.11)
RDW: 17.9 % — ABNORMAL HIGH (ref 11.5–15.5)
WBC: 14.4 10*3/uL — AB (ref 4.0–10.5)

## 2017-08-25 LAB — CULTURE, BLOOD (ROUTINE X 2)
Culture: NO GROWTH
Culture: NO GROWTH
SPECIAL REQUESTS: ADEQUATE
SPECIAL REQUESTS: ADEQUATE

## 2017-08-25 LAB — PROCALCITONIN: PROCALCITONIN: 1.48 ng/mL

## 2017-08-25 MED ORDER — PANTOPRAZOLE SODIUM 40 MG PO TBEC
40.0000 mg | DELAYED_RELEASE_TABLET | Freq: Every day | ORAL | Status: DC
  Administered 2017-08-25 – 2017-09-08 (×15): 40 mg via ORAL
  Filled 2017-08-25 (×15): qty 1

## 2017-08-25 MED ORDER — POTASSIUM CHLORIDE CRYS ER 20 MEQ PO TBCR
40.0000 meq | EXTENDED_RELEASE_TABLET | Freq: Two times a day (BID) | ORAL | Status: DC
  Administered 2017-08-25 – 2017-08-26 (×2): 40 meq via ORAL
  Filled 2017-08-25 (×5): qty 2

## 2017-08-25 MED ORDER — POLYETHYLENE GLYCOL 3350 17 G PO PACK
17.0000 g | PACK | Freq: Every day | ORAL | Status: DC
  Administered 2017-08-25 – 2017-08-30 (×3): 17 g via ORAL
  Filled 2017-08-25 (×6): qty 1

## 2017-08-25 MED ORDER — SODIUM CHLORIDE 0.9 % IV SOLN
INTRAVENOUS | Status: AC
Start: 2017-08-25 — End: 2017-08-26
  Administered 2017-08-25 – 2017-08-26 (×2): via INTRAVENOUS

## 2017-08-25 MED ORDER — GUAIFENESIN 200 MG PO TABS
200.0000 mg | ORAL_TABLET | ORAL | Status: DC | PRN
  Filled 2017-08-25: qty 1

## 2017-08-25 MED ORDER — SENNA 8.6 MG PO TABS
1.0000 | ORAL_TABLET | Freq: Every day | ORAL | Status: DC
  Filled 2017-08-25 (×6): qty 1

## 2017-08-25 MED ORDER — GUAIFENESIN-DM 100-10 MG/5ML PO SYRP: 5.0000 mL | ORAL_SOLUTION | ORAL | Status: DC | PRN

## 2017-08-25 MED ORDER — FUROSEMIDE 10 MG/ML IJ SOLN
20.0000 mg | Freq: Once | INTRAMUSCULAR | Status: AC
  Administered 2017-08-25: 20 mg via INTRAVENOUS
  Filled 2017-08-25: qty 2

## 2017-08-25 MED ORDER — ALUM & MAG HYDROXIDE-SIMETH 200-200-20 MG/5ML PO SUSP
15.0000 mL | ORAL | Status: DC | PRN
  Administered 2017-08-25: 15 mL via ORAL
  Filled 2017-08-25: qty 30

## 2017-08-25 NOTE — Progress Notes (Signed)
Patient ID: Kristina Torres, female   DOB: March 02, 1946, 71 y.o.   MRN: [id number] Subjective: The patient is seen and examined today. Her husband was at the bedside. She is feeling fine today with no specific complaints except for dry mouth and lack of appetite. She denied having any fever or chills. She has no nausea, vomiting, diarrhea or constipation.  Objective: Vital signs in last 24 hours: Temp:  [97.5 F (36.4 C)-101.1 F (38.4 C)] 98.3 F (36.8 C) (09/08 0452) Pulse Rate:  [75-91] 90 (09/08 0452) Resp:  [18-31] 19 (09/08 0452) BP: (93-138)/(49-68) 94/53 (09/08 0452) SpO2:  [90 %-96 %] 90 % (09/08 0452) Weight:  [91.2 kg (201 lb)] 91.2 kg (201 lb) (09/07 2134)  Intake/Output from previous day: 09/07 0701 - 09/08 0700 In: 2785 [P.O.:480; I.V.:2250; IV Piggyback:50] Out: 1525 [IONGE:9528] Intake/Output this shift: No intake/output data recorded.  General appearance: alert, cooperative, fatigued and no distress Resp: clear to auscultation bilaterally Cardio: regular rate and rhythm, S1, S2 normal, no murmur, click, rub or gallop GI: soft, non-tender; bowel sounds normal; no masses,  no organomegaly Extremities: extremities normal, atraumatic, no cyanosis or edema  Lab Results:   Recent Labs  08/24/17 0454 08/25/17 0336  WBC 14.5* 14.4*  HGB 7.7* 8.6*  HCT 23.7* 27.1*  PLT 120* 113*   BMET  Recent Labs  08/24/17 0454 08/25/17 0336  NA 139 142  K 3.2* 3.2*  CL 105 108  CO2 28 27  GLUCOSE 110* 107*  BUN 32* 31*  CREATININE 1.05* 1.01*  CALCIUM 11.6* 10.4*    Studies/Results: Ct Chest Wo Contrast  Result Date: 08/23/2017 CLINICAL DATA:  History of dry cough EXAM: CT CHEST WITHOUT CONTRAST TECHNIQUE: Multidetector CT imaging of the chest was performed following the standard protocol without IV contrast. Patient appears to have been scanned in the prone position. COMPARISON:  08/20/2017, 08/23/2017, 08/20/2017 FINDINGS: Cardiovascular: Limited evaluation without  intravenous contrast. Atherosclerotic calcifications of the aorta. No aneurysmal dilatation. Borderline heart size. No pericardial effusion. Mediastinum/Nodes: No thyroid mass. Midline trachea. Scattered subcentimeter mediastinal lymph nodes. Limited evaluation for hilar adenopathy. Esophagus within normal limits. Enlarged left supraclavicular lymph node measuring 14 mm. Lungs/Pleura: Hazy dependent atelectasis anteriorly. Mild emphysema in the upper lobes. Mild bronchiectasis and bandlike consolidation in the posterior lower lobes, possibly scarring. Negative for pneumothorax. Small dependent anterior pleural effusions. Upper Abdomen: Vague hypodense mass in the liver with coarse calcification re- demonstrated. Prominent appearing spleen. Edema within the lateral chest walls inferiorly. Musculoskeletal: Degenerative changes. No definite acute or suspicious bone lesion. IMPRESSION: 1. Mild emphysema and small pleural effusions. Mild posterior lower lobe bronchiectasis with associated consolidation, uncertain if this is chronic consolidation or mild infiltrate. 2. Enlarged left supraclavicular lymph node measuring up to 14 mm 3. Small pleural effusions 4. Visible spleen in the upper abdomen appears enlarged 5. Partially visualized centrally calcified mass in the liver Aortic Atherosclerosis (ICD10-I70.0) and Emphysema (ICD10-J43.9). Electronically Signed   By: Donavan Foil M.D.   On: 08/23/2017 15:34   US Guided Needle Placement  Result Date: 08/24/2017 INDICATION: 71 year old female with high-grade lymphoma of uncertain type. Repeat biopsy is requested to facilitate flow cytometry and ultimate diagnosis. EXAM: Ultrasound-guided biopsy left supraclavicular lymph node MEDICATIONS: None. ANESTHESIA/SEDATION: None FLUOROSCOPY TIME:  Fluoroscopy Time: 0 minutes 0 seconds (0 mGy). COMPLICATIONS: None immediate. PROCEDURE: Informed written consent was obtained from the patient after a thorough discussion of the  procedural risks, benefits and alternatives. All questions were addressed. A timeout was performed prior to  the initiation of the procedure. The left neck was interrogated with ultrasound. There are multiple prominent hypoechoic lymph nodes. The patient's breathing is hyperdynamic. The lymph nodes are in close proximity to the carotid artery. The region was sterilely prepped and draped in standard fashion using chlorhexidine skin prep. Local anesthesia was attained by infiltration with 1% lidocaine. A small dermatotomy was made. Under real-time sonographic guidance, multiple 18 gauge core biopsies were carefully obtained using a a [employer] automated biopsy device. The course the needle was identified on all needle passes confirming that biopsy specimens were obtained of the lymph nodes. There is no evidence of immediate complication. IMPRESSION: Technically successful ultrasound-guided core biopsy of left supraclavicular adenopathy. Of note, the biopsy cores were fragmented and gelatinous. Numerous biopsy passes were obtained in an attempt to acquire enough material for testing. Electronically Signed   By: Jacqulynn Cadet M.D.   On: 08/24/2017 17:09   Ct Biopsy  Result Date: 08/23/2017 INDICATION: 71 year old female with retroperitoneal lymphadenopathy. She presents for biopsy for tissue diagnosis. EXAM: CT BIOPSY MEDICATIONS: None. ANESTHESIA/SEDATION: Moderate (conscious) sedation was employed during this procedure. A total of Versed 1 mg and Fentanyl 50 mcg was administered intravenously. Moderate Sedation Time: 15 minutes. The patient's level of consciousness and vital signs were monitored continuously by radiology nursing throughout the procedure under my direct supervision. FLUOROSCOPY TIME:  Fluoroscopy Time: 0 minutes 0 seconds (0 mGy). COMPLICATIONS: None immediate. PROCEDURE: Informed written consent was obtained from the patient after a thorough discussion of the procedural risks, benefits and  alternatives. All questions were addressed. A timeout was performed prior to the initiation of the procedure. The patient was placed prone on the CT gantry. A planning axial CT scan was performed. A left para-aortic retroperitoneal lymph node was successfully identified. The overlying skin was marked and then sterilely prepped and draped in standard fashion using chlorhexidine skin prep. Local anesthesia was attained by infiltration with 1% lidocaine. A small dermatotomy was made. Under intermittent CT guidance, a 17 gauge introducer needle was advanced through the left paraspinal musculature and positioned at the margin of the retroperitoneal lymph node. 18 gauge core biopsies were then coaxially obtained. Biopsy specimens were placed in saline and delivered to pathology for further analysis. The introducer needle was removed. Post biopsy axial CT imaging demonstrates no evidence of immediate complication. The patient tolerated the procedure well. IMPRESSION: Successful core biopsy of left periaortic retroperitoneal lymph node. Of note, the lymph node is very deep and only 1 core could be obtained. Signed, Criselda Peaches, MD [facility] Electronically Signed   By: Jacqulynn Cadet M.D.   On: 08/23/2017 17:33    Medications: I have reviewed the patient's current medications.  Assessment/Plan: This is a very pleasant 71 years old white female with: 1) persistent lymphadenopathy suspicious for low-grade lymphoma. Initial core biopsy of the retroperitoneal lymph node was not conclusive. The patient had repeat ultrasound guided biopsy of left supraclavicular lymph node but the pathology report is still pending. 2) hypercalcemia: Improving with IV hydration. 3) dehydration: Continue IV hydration. 4) lack of appetite and weight loss: Management per the primary team. She was seen by the dietitian.   LOS: 5 days    Eilleen Kempf 08/25/2017

## 2017-08-25 NOTE — Progress Notes (Signed)
Unable to wean from 02 Sats 83% on room air 92% on 2 L [place] Will continue to encourage incentive spirometer

## 2017-08-25 NOTE — Progress Notes (Signed)
PROGRESS NOTE   Kristina Torres  IRS:[id number]    DOB: 10/28/1946    DOA: 08/20/2017  PCP: [name], MD   I have briefly reviewed patients previous medical records in [facility].  Brief Narrative:  71 year old married female, PMH of HTN, HLD, gout, rheumatoid arthritis and renal mass presented to ED on 08/20/17 with complaints of nausea, nonbloody emesis, RUQ abdominal pain, lightheadedness, generalized weakness and cough. She was seen at [facility] on Tuesday prior to admission and found to have "right renal mass on ultrasound with blockage", started on oral ciprofloxacin for UTI and was supposed to see Urologist on Thursday with a CT scan. In the ED, transiently hypotensive, noted to have acute kidney injury with creatinine of 2.08, hypercalcemia with calcium of 13.87, oxygen saturation 90% on room air and CT abdomen demonstrated right hepatic lobe with hypodense mass, moderate retroperitoneal bulky lymphadenopathy, splenomegaly, severe right hydronephrosis and hydroureter. Urology consulted. IR placed right percutaneous nephrostomy. Oncology consulted and underwent retroperitoneal lymph node biopsy 9/6, preliminary pathology shows malignancy, suspicious for lymphoma but inadequate sample and hence oncology had IR do additional biopsy on 9/7-results pending. Hypercalcemia improving. Mental status slightly better.  Assessment & Plan:   Principal Problem:   Nausea & vomiting Active Problems:   Rheumatoid arthritis ([place])   Essential hypertension   Gout   UTI (urinary tract infection)   Sepsis (HCC)   HLD (hyperlipidemia)   Hypotension   AKI (acute kidney injury) ([place])   Hypercalcemia   Thrombocytopenia (HCC)   Cough   Macrocytic anemia   Lymphadenopathy   1. Severe right hydronephrosis and hydroureter: Urology consulted 9/4 and recommended nephrostomy tube placement by IR which was done same day. Patient had been empirically started on IV ceftriaxone but urine  culture from 9/3 and from drain on 9/4 were negative and hence ceftriaxone discontinued. Discussed with consulting urologist on 9/7, recommends DC with percutaneous nephrostomy until outpatient follow-up with Dr. Karsten Ro regarding further evaluation including cystoscopy etc. He indicated that bladder mass could well be lymphoma. 2. Hypotension: Likely related to dehydration from poor oral intake and GI losses. Continue IV fluids. Although had SIRS criteria on admission, not likely from infectious source. Blood cultures 2: Negative to date. Ongoing intermittent soft blood pressures but asymptomatic. 3. Nausea and vomiting: Secondary to obstructive uropathy and acute kidney injury. Having intermittent nausea but no vomiting. Poor appetite. 4. Hypercalcemia: Patient liberally takes Tums at home for unclear reasons. Holding HCTZ, calcium and vitamin D supplements. Despite aggressive IV normal saline hydration and creatinine normalized, persistent hypercalcemia (corrected calcium approximately 14.5 on 9/6). Status post pamidronate 60 mg IV 1 on 6/5 and may take a couple days to take effect. Changed calcitonin to IM as discussed with pharmacy and oncology. Also provided a dose of Lasix IV, continue. This is likely contributing to patient's mental status changes. Vitamin D, 25-hydroxy: 30.2. PTH related peptide: Pending. PTH: 16. Corrected serum calcium: 12. Improving. Mental status better but still has periods of lethargy, somnolence and confusion. 5. Anemia: Closely follow CBCs and transfuse if hemoglobin <7. As per oncology, likely related to methotrexate and anemia of chronic disease. Anemia panel: Iron 29, TIBC 214, saturation 14, ferritin 745, folate 9.6, B12: 767. Suggests anemia of chronic disease. As per oncology, transfuse if hemoglobin <7.5 g per DL. Hemoglobin 8.6. 6. Thrombocytopenia: Stable without bleeding. As per oncology, likely related to methotrexate or autoimmune mediated. 7. Hypokalemia:  Replaced: 8. Elevated lactate: Likely due to dehydration and acute kidney injury.  Improved. 9. Acute kidney injury: As per admitting physician, creatinine was 0.93 on 08/13/17. Presented with creatinine of 2.08. Secondary to obstructive uropathy and dehydration. Creatinine normalized. Continue IV fluids and follow BMP in a.m. 10. Dry cough/fever/? HCAP: Has chronic cough and patient smokes. Chest x-ray negative. CT chest shows mild posterior lower lobe bronchiectasis with associated consolidation, uncertain if this is chronic consolidation or mild infiltrate. Developed fever of 101F on 9/7. Cultures drawn and initiated IV vancomycin and Zosyn. Pro-calcitonin 1.48. Repeat blood cultures from 9/7: Negative to date. 11. Essential hypertension: HCTZ held. Blood pressure is currently controlled off antihypertensives. 12. Gout: No acute flare. Continue allopurinol. 13. Hyperlipidemia: Statins. 14. Liver mass: CT abdomen showed awake hypodense mass with central calcification in the right hepatic lobe presumably corresponding to the MRI mass from 2011 which was thought to represent hemangioma. CT also showed moderate retroperitoneal somewhat bulky lymphadenopathy and splenomegaly raising concerns for metastatic disease or lymphoproliferative abnormality such as lymphoma. Oncology consulted 9/4 and indicate that the liver lesion is not suspicious for malignancy. As per oncology, based on initial lymph node biopsy, lymphoma is suspected but inadequate tissue sample and findings could also be due to lymphoproliferative disorder secondary to MTX or RA. Second biopsy off left [place] node done on 9/7-results to be reviewed on Monday. 15. Rheumatoid arthritis: Methotrexate.  16. Urethral/bladder mass: As per urology consultation, outpatient cystoscopy. Please see discussion in problem #1. 17. Acute metabolic encephalopathy: Likely related to acute kidney injury and hypercalcemia (likely the main cause). Avoid sedating  medications and carefully reviewed current medications. No focal deficits. If mental status does not start to improve with correction of metabolic abnormalities, then consider CT head. Mental status as outlined above. Continue to monitor closely. 4. Fall: As per RN, sustained fall yesterday without injuries. PT evaluation. Fall precautions. 19. Nutrition: Poor oral intake as per family. Dietitian consulted. 20. Acute respiratory failure with hypoxia: May be related to bronchiectasis, possible pneumonia and atelectasis. Treat pneumonia. Incentive spirometry. Wean as tolerated.   DVT prophylaxis: SCDs Code Status: Full Family Communication: Discussed in detail with patient's spouse and sister at bedside. Disposition: To be determined pending clinical improvement. Will likely need SNF. Not medically stable already for discharge at this time.   Consultants:  Urology Medical oncology Interventional radiology   Procedures:  Right percutaneous nephrostomy Retroperitoneal lymph node biopsy 9/6. Left supraclavicular lymph node biopsy 9/7  Antimicrobials:  IV Rocephin-discontinued 9/5  IV vancomycin and Zosyn 9/7 >   Subjective: Patient had been sitting on chair since 5 this morning until my visit at approximately 9 AM. Wanted to return to the bed. As per spouse, mental status has improved and is more alert than 2 days ago but still has periods of somnolence and confusion. Advised him that this may take several days to clear as her metabolic meters continued to improve. No other complaints reported.  ROS: No pain, nausea or vomiting  Objective:  Vitals:   08/24/17 1700 08/24/17 2134 08/25/17 0452 08/25/17 1131  BP:  (!) 138/51 (!) 94/53   Pulse:  88 90   Resp:  19 19   Temp: (!) 101.1 F (38.4 C) 98.1 F (36.7 C) 98.3 F (36.8 C)   TempSrc: Rectal Oral Oral   SpO2:  96% 90% 93%  Weight:  91.2 kg (201 lb)    Height:        Examination:  General exam: Pleasant elderly female,  moderately built and overweight, sitting up on her reclining chair this  morning. Chronically ill-looking but in no obvious distress. Does not look septic or toxic. Respiratory system: Slightly diminished breath sounds in the bases with occasional basal crackles but otherwise clear to auscultation. No increased work of breathing. Cardiovascular system: S1 and S2 heard, RRR. No JVD, murmurs or pedal edema. Telemetry: Sinus rhythm. Stable Gastrointestinal system: Abdomen is nondistended, soft and nontender. No organomegaly or masses felt. Normal bowel sounds heard. Right percutaneous nephrostomy +. Stable Central nervous system: Alert and oriented 2. No focal neurological deficits. Improved. Extremities: Moves all extremities symmetrically. Skin: No rashes, lesions or ulcers Psychiatry: Judgement and insight impaired. Mood & affect pleasant.     Data Reviewed: I have personally reviewed following labs and imaging studies  CBC:  Recent Labs Lab 08/21/17 0036 08/21/17 0233 08/22/17 1051 08/23/17 0422 08/24/17 0454 08/25/17 0336  WBC 15.1* 15.9* 14.0* 14.3* 14.5* 14.4*  NEUTROABS 7.3  --   --   --   --   --   HGB 8.0* 9.1* 8.0* 7.7* 7.7* 8.6*  HCT 24.2* 27.9* 24.7* 23.8* 23.7* 27.1*  MCV 100.8* 100.7* 102.1* 103.0* 102.2* 103.0*  PLT 100* 111* 130* 120* 120* 696*   Basic Metabolic Panel:  Recent Labs Lab 08/21/17 0832 08/22/17 1051 08/23/17 0422 08/24/17 0454 08/25/17 0336  NA 135 137 137 139 142  K 3.3* 3.8 3.5 3.2* 3.2*  CL 100* 105 105 105 108  CO2 23 26 24 28 27   GLUCOSE 85 85 76 110* 107*  BUN 28* 27* 31* 32* 31*  CREATININE 1.56* 1.34* 1.14* 1.05* 1.01*  CALCIUM 12.8* 13.3* 12.9* 11.6* 10.4*   Liver Function Tests:  Recent Labs Lab 08/20/17 1931 08/21/17 0832 08/24/17 0454 08/25/17 0336  AST 29  --   --   --   ALT 32  --   --   --   ALKPHOS 90  --   --   --   BILITOT 2.0*  --   --   --   PROT 5.2*  --   --   --   ALBUMIN 2.8* 2.0* 1.9* 2.0*    Coagulation Profile:  Recent Labs Lab 08/20/17 2255  INR 1.16   Cardiac Enzymes: No results for input(s): CKTOTAL, CKMB, CKMBINDEX, TROPONINI in the last 168 hours. HbA1C: No results for input(s): HGBA1C in the last 72 hours. CBG:  Recent Labs Lab 08/21/17 1149 08/22/17 0759 08/23/17 0738 08/23/17 1151  GLUCAP 79 84 84 96    Recent Results (from the past 240 hour(s))  Blood Culture (routine x 2)     Status: None   Collection Time: 08/20/17  8:10 PM  Result Value Ref Range Status   Specimen Description BLOOD RIGHT HAND  Final   Special Requests IN PEDIATRIC BOTTLE Blood Culture adequate volume  Final   Culture NO GROWTH 5 DAYS  Final   Report Status 08/25/2017 FINAL  Final  Blood Culture (routine x 2)     Status: None   Collection Time: 08/20/17  8:13 PM  Result Value Ref Range Status   Specimen Description BLOOD RIGHT ANTECUBITAL  Final   Special Requests   Final    BOTTLES DRAWN AEROBIC AND ANAEROBIC Blood Culture adequate volume   Culture NO GROWTH 5 DAYS  Final   Report Status 08/25/2017 FINAL  Final  Urine Culture     Status: None   Collection Time: 08/20/17  9:15 PM  Result Value Ref Range Status   Specimen Description URINE, CATHETERIZED  Final   Special Requests NONE  Final   Culture NO GROWTH  Final   Report Status 08/22/2017 FINAL  Final  Urine Culture     Status: None   Collection Time: 08/21/17  2:32 PM  Result Value Ref Range Status   Specimen Description URINE, CATHETERIZED  URINE FROM DRAIN PLACEMENT  Final   Special Requests NONE  Final   Culture NO GROWTH  Final   Report Status 08/22/2017 FINAL  Final  Culture, blood (Routine X 2) w Reflex to ID Panel     Status: None (Preliminary result)   Collection Time: 08/24/17  6:13 PM  Result Value Ref Range Status   Specimen Description BLOOD LEFT HAND  Final   Special Requests   Final    IN PEDIATRIC BOTTLE Blood Culture results may not be optimal due to an excessive volume of blood received in  culture bottles   Culture NO GROWTH < 24 HOURS  Final   Report Status PENDING  Incomplete  Culture, blood (Routine X 2) w Reflex to ID Panel     Status: None (Preliminary result)   Collection Time: 08/24/17  6:14 PM  Result Value Ref Range Status   Specimen Description BLOOD RIGHT HAND  Final   Special Requests   Final    BOTTLES DRAWN AEROBIC AND ANAEROBIC Blood Culture adequate volume   Culture NO GROWTH < 24 HOURS  Final   Report Status PENDING  Incomplete         Radiology Studies: Ct Chest Wo Contrast  Result Date: 08/23/2017 CLINICAL DATA:  History of dry cough EXAM: CT CHEST WITHOUT CONTRAST TECHNIQUE: Multidetector CT imaging of the chest was performed following the standard protocol without IV contrast. Patient appears to have been scanned in the prone position. COMPARISON:  08/20/2017, 08/23/2017, 08/20/2017 FINDINGS: Cardiovascular: Limited evaluation without intravenous contrast. Atherosclerotic calcifications of the aorta. No aneurysmal dilatation. Borderline heart size. No pericardial effusion. Mediastinum/Nodes: No thyroid mass. Midline trachea. Scattered subcentimeter mediastinal lymph nodes. Limited evaluation for hilar adenopathy. Esophagus within normal limits. Enlarged left supraclavicular lymph node measuring 14 mm. Lungs/Pleura: Hazy dependent atelectasis anteriorly. Mild emphysema in the upper lobes. Mild bronchiectasis and bandlike consolidation in the posterior lower lobes, possibly scarring. Negative for pneumothorax. Small dependent anterior pleural effusions. Upper Abdomen: Vague hypodense mass in the liver with coarse calcification re- demonstrated. Prominent appearing spleen. Edema within the lateral chest walls inferiorly. Musculoskeletal: Degenerative changes. No definite acute or suspicious bone lesion. IMPRESSION: 1. Mild emphysema and small pleural effusions. Mild posterior lower lobe bronchiectasis with associated consolidation, uncertain if this is chronic  consolidation or mild infiltrate. 2. Enlarged left supraclavicular lymph node measuring up to 14 mm 3. Small pleural effusions 4. Visible spleen in the upper abdomen appears enlarged 5. Partially visualized centrally calcified mass in the liver Aortic Atherosclerosis (ICD10-I70.0) and Emphysema (ICD10-J43.9). Electronically Signed   By: Donavan Foil M.D.   On: 08/23/2017 15:34   US Guided Needle Placement  Result Date: 08/24/2017 INDICATION: 71 year old female with high-grade lymphoma of uncertain type. Repeat biopsy is requested to facilitate flow cytometry and ultimate diagnosis. EXAM: Ultrasound-guided biopsy left supraclavicular lymph node MEDICATIONS: None. ANESTHESIA/SEDATION: None FLUOROSCOPY TIME:  Fluoroscopy Time: 0 minutes 0 seconds (0 mGy). COMPLICATIONS: None immediate. PROCEDURE: Informed written consent was obtained from the patient after a thorough discussion of the procedural risks, benefits and alternatives. All questions were addressed. A timeout was performed prior to the initiation of the procedure. The left neck was interrogated with ultrasound. There are multiple prominent hypoechoic lymph nodes.  The patient's breathing is hyperdynamic. The lymph nodes are in close proximity to the carotid artery. The region was sterilely prepped and draped in standard fashion using chlorhexidine skin prep. Local anesthesia was attained by infiltration with 1% lidocaine. A small dermatotomy was made. Under real-time sonographic guidance, multiple 18 gauge core biopsies were carefully obtained using a a [employer] automated biopsy device. The course the needle was identified on all needle passes confirming that biopsy specimens were obtained of the lymph nodes. There is no evidence of immediate complication. IMPRESSION: Technically successful ultrasound-guided core biopsy of left supraclavicular adenopathy. Of note, the biopsy cores were fragmented and gelatinous. Numerous biopsy passes were obtained in  an attempt to acquire enough material for testing. Electronically Signed   By: Jacqulynn Cadet M.D.   On: 08/24/2017 17:09   Ct Biopsy  Result Date: 08/23/2017 INDICATION: 71 year old female with retroperitoneal lymphadenopathy. She presents for biopsy for tissue diagnosis. EXAM: CT BIOPSY MEDICATIONS: None. ANESTHESIA/SEDATION: Moderate (conscious) sedation was employed during this procedure. A total of Versed 1 mg and Fentanyl 50 mcg was administered intravenously. Moderate Sedation Time: 15 minutes. The patient's level of consciousness and vital signs were monitored continuously by radiology nursing throughout the procedure under my direct supervision. FLUOROSCOPY TIME:  Fluoroscopy Time: 0 minutes 0 seconds (0 mGy). COMPLICATIONS: None immediate. PROCEDURE: Informed written consent was obtained from the patient after a thorough discussion of the procedural risks, benefits and alternatives. All questions were addressed. A timeout was performed prior to the initiation of the procedure. The patient was placed prone on the CT gantry. A planning axial CT scan was performed. A left para-aortic retroperitoneal lymph node was successfully identified. The overlying skin was marked and then sterilely prepped and draped in standard fashion using chlorhexidine skin prep. Local anesthesia was attained by infiltration with 1% lidocaine. A small dermatotomy was made. Under intermittent CT guidance, a 17 gauge introducer needle was advanced through the left paraspinal musculature and positioned at the margin of the retroperitoneal lymph node. 18 gauge core biopsies were then coaxially obtained. Biopsy specimens were placed in saline and delivered to pathology for further analysis. The introducer needle was removed. Post biopsy axial CT imaging demonstrates no evidence of immediate complication. The patient tolerated the procedure well. IMPRESSION: Successful core biopsy of left periaortic retroperitoneal lymph node. Of  note, the lymph node is very deep and only 1 core could be obtained. Signed, Criselda Peaches, MD [facility] Electronically Signed   By: Jacqulynn Cadet M.D.   On: 08/23/2017 17:33        Scheduled Meds: . allopurinol  300 mg Oral Daily  . dextromethorphan-guaiFENesin  1 tablet Oral BID  . [START ON 08/27/2017] methotrexate  17.5 mg Oral Weekly  . potassium chloride  40 mEq Oral BID  . simvastatin  20 mg Oral Daily   Continuous Infusions: . piperacillin-tazobactam (ZOSYN)  IV Stopped (08/25/17 1055)  . vancomycin Stopped (08/25/17 0958)     LOS: 5 days     Zoey Bidwell, MD, FACP, FHM. [facility] Pager [phone]  If 7PM-7AM, please contact night-coverage [email or web address] Password [email or web address] 08/25/2017, 2:53 PM

## 2017-08-26 ENCOUNTER — Inpatient Hospital Stay (HOSPITAL_COMMUNITY): Payer: Medicare Other

## 2017-08-26 LAB — URINE CULTURE: CULTURE: NO GROWTH

## 2017-08-26 LAB — BASIC METABOLIC PANEL
ANION GAP: 5 (ref 5–15)
BUN: 29 mg/dL — ABNORMAL HIGH (ref 6–20)
CHLORIDE: 109 mmol/L (ref 101–111)
CO2: 27 mmol/L (ref 22–32)
Calcium: 8.5 mg/dL — ABNORMAL LOW (ref 8.9–10.3)
Creatinine, Ser: 1.12 mg/dL — ABNORMAL HIGH (ref 0.44–1.00)
GFR calc non Af Amer: 48 mL/min — ABNORMAL LOW (ref >60)
GFR, EST AFRICAN AMERICAN: 56 mL/min — AB (ref >60)
Glucose, Bld: 102 mg/dL — ABNORMAL HIGH (ref 65–99)
POTASSIUM: 3.4 mmol/L — AB (ref 3.5–5.1)
Sodium: 141 mmol/L (ref 135–145)

## 2017-08-26 LAB — PTH-RELATED PEPTIDE: PTH-related peptide: <2 pmol/L

## 2017-08-26 LAB — ALBUMIN: Albumin: 1.7 g/dL — ABNORMAL LOW (ref 3.5–5.0)

## 2017-08-26 MED ORDER — SODIUM CHLORIDE 0.9 % IV SOLN: INTRAVENOUS | Status: DC

## 2017-08-26 NOTE — NC FL2 (Signed)
[place] LEVEL OF CARE SCREENING TOOL     IDENTIFICATION  Patient Name: Kristina Torres Birthdate: 13-Jul-1946 Sex: female Admission Date (Current Location): 08/20/2017  [facility] and [place] Number:  [employer] and Address:  [facility] [place] [place] [address] [place], [place] [address]      Provider Number: [id number]  Attending Physician Name and Address:  Modena Jansky, MD  Relative Name and Phone Number:       Current Level of Care: Hospital Recommended Level of Care: [place] Prior Approval Number:    Date Approved/Denied:   PASRR Number: [id number]  Discharge Plan: SNF    Current Diagnoses: Patient Active Problem List   Diagnosis Date Noted  . Macrocytic anemia 08/21/2017  . Lymphadenopathy   . UTI (urinary tract infection) 08/20/2017  . Sepsis ([place]) 08/20/2017  . Hypotension 08/20/2017  . AKI (acute kidney injury) ([place]) 08/20/2017  . Nausea & vomiting 08/20/2017  . Hypercalcemia 08/20/2017  . Thrombocytopenia ([place]) 08/20/2017  . Cough 08/20/2017  . Rheumatoid arthritis ([place])   . Essential hypertension   . Gout   . HLD (hyperlipidemia)     Orientation RESPIRATION BLADDER Height & Weight     Self, Place  Normal Urostomy, External catheter Weight: 201 lb 11.5 oz (91.5 kg) Height:  4' 10.5" (148.6 cm)  BEHAVIORAL SYMPTOMS/MOOD NEUROLOGICAL BOWEL NUTRITION STATUS      Incontinent    AMBULATORY STATUS COMMUNICATION OF NEEDS Skin   Extensive Assist Verbally Normal                       Personal Care Assistance Level of Assistance  Bathing, Feeding, Dressing Bathing Assistance: Maximum assistance Feeding assistance: Limited assistance Dressing Assistance: Maximum assistance     Functional Limitations Info             SPECIAL CARE FACTORS FREQUENCY  PT (By licensed PT), OT (By licensed OT)     PT Frequency: 5x/wk OT Frequency: 5x/wk            Contractures       Additional Factors Info  Code Status, Allergies Code Status Info: Full Allergies Info: Streptomycin           Current Medications (08/26/2017):  This is the current hospital active medication list Current Facility-Administered Medications  Medication Dose Route Frequency Provider Last Rate Last Dose  . 0.9 %  sodium chloride infusion   Intravenous Continuous Hongalgi, Anand D, MD      . acetaminophen (TYLENOL) tablet 650 mg  650 mg Oral Q6H PRN Ivor Costa, MD   650 mg at 08/24/17 1704   Or  . acetaminophen (TYLENOL) suppository 650 mg  650 mg Rectal Q6H PRN Ivor Costa, MD      . allopurinol (ZYLOPRIM) tablet 300 mg  300 mg Oral Daily Ivor Costa, MD   300 mg at 08/26/17 1020  . alum & mag hydroxide-simeth (MAALOX/MYLANTA) 200-200-20 MG/5ML suspension 15 mL  15 mL Oral Q4H PRN Modena Jansky, MD   15 mL at 08/25/17 0755  . dextromethorphan-guaiFENesin (MUCINEX DM) 30-600 MG per 12 hr tablet 1 tablet  1 tablet Oral BID Ivor Costa, MD   1 tablet at 08/26/17 1020  . guaiFENesin-dextromethorphan (ROBITUSSIN DM) 100-10 MG/5ML syrup 5 mL  5 mL Oral Q4H PRN Modena Jansky, MD      . Derrill Memo ON 08/27/2017] methotrexate (RHEUMATREX) tablet 17.5 mg  17.5 mg Oral Weekly Bajbus, Lauren  D, RPH      . ondansetron (ZOFRAN) injection 4 mg  4 mg Intravenous Q8H PRN Ivor Costa, MD   4 mg at 08/24/17 1438  . pantoprazole (PROTONIX) EC tablet 40 mg  40 mg Oral Daily Modena Jansky, MD   40 mg at 08/26/17 1000  . piperacillin-tazobactam (ZOSYN) IVPB 3.375 g  3.375 g Intravenous Q8H Lyndee Leo, [facility]   Stopped at 08/26/17 1041  . polyethylene glycol (MIRALAX / GLYCOLAX) packet 17 g  17 g Oral Daily Modena Jansky, MD   17 g at 08/26/17 1020  . potassium chloride SA (K-DUR,KLOR-CON) CR tablet 40 mEq  40 mEq Oral BID Modena Jansky, MD   40 mEq at 08/26/17 1000  . senna (SENOKOT) tablet 8.6 mg  1 tablet Oral QHS Hongalgi, Anand D, MD      . simvastatin (ZOCOR) tablet 20 mg  20 mg Oral Daily Ivor Costa, MD   20 mg at 08/26/17 1020  . vancomycin (VANCOCIN) IVPB 750 mg/150 ml premix  750 mg Intravenous Q12H Lyndee Leo, [facility]   Stopped at 08/26/17 1146     Discharge Medications: Please see discharge summary for a list of discharge medications.  Relevant Imaging Results:  Relevant Lab Results:   Additional Information SS#: [id number]  Geralynn Ochs, LCSW

## 2017-08-26 NOTE — Progress Notes (Signed)
Pt refused to take night time oral medicines,pt did not complain of nausea explain to pt why she needed to take her meds pt still refused, family member at bedside will continue to monitor

## 2017-08-26 NOTE — Progress Notes (Signed)
Patient's IV access infiltrated, nurse and IV team nurse tried, waiting for another team of IV nurses to check the patient, notified pharmacy regarding Zosyn not started at 2 pm.  Will start Zosyn as soon as patient has the access.

## 2017-08-26 NOTE — Progress Notes (Signed)
PROGRESS NOTE   Kristina Torres  YIR:[id number]    DOB: 1946-03-27    DOA: 08/20/2017  PCP: [name], MD   I have briefly reviewed patients previous medical records in [facility].  Brief Narrative:  71 year old married female, PMH of HTN, HLD, gout, rheumatoid arthritis and renal mass presented to ED on 08/20/17 with complaints of nausea, nonbloody emesis, RUQ abdominal pain, lightheadedness, generalized weakness and cough. She was seen at [facility] on Tuesday prior to admission and found to have "right renal mass on ultrasound with blockage", started on oral ciprofloxacin for UTI and was supposed to see Urologist on Thursday with a CT scan. In the ED, transiently hypotensive, noted to have acute kidney injury with creatinine of 2.08, hypercalcemia with calcium of 13.87, oxygen saturation 90% on room air and CT abdomen demonstrated right hepatic lobe with hypodense mass, moderate retroperitoneal bulky lymphadenopathy, splenomegaly, severe right hydronephrosis and hydroureter. Urology consulted. IR placed right percutaneous nephrostomy. Oncology consulted and underwent retroperitoneal lymph node biopsy 9/6, preliminary pathology shows malignancy, suspicious for lymphoma but inadequate sample and hence oncology had IR do additional biopsy on 9/7-results pending. Hypercalcemia almost resolved. Mental status slowly improving..  Assessment & Plan:   Principal Problem:   Nausea & vomiting Active Problems:   Rheumatoid arthritis ([place])   Essential hypertension   Gout   UTI (urinary tract infection)   Sepsis (HCC)   HLD (hyperlipidemia)   Hypotension   AKI (acute kidney injury) ([place])   Hypercalcemia   Thrombocytopenia (HCC)   Cough   Macrocytic anemia   Lymphadenopathy   1. Severe right hydronephrosis and hydroureter: Urology consulted 9/4 and recommended nephrostomy tube placement by IR which was done same day. Patient had been empirically started on IV ceftriaxone but  urine culture from 9/3 and from drain on 9/4 were negative and hence ceftriaxone discontinued. Discussed with consulting urologist on 9/7, recommends DC with percutaneous nephrostomy until outpatient follow-up with Dr. Karsten Ro regarding further evaluation including cystoscopy etc. He indicated that bladder mass could well be lymphoma. 2. Hypotension: Likely related to dehydration from poor oral intake and GI losses. Continue IV fluids. Although had SIRS criteria on admission, not likely from infectious source. Blood cultures 2: Negative to date. Ongoing intermittent soft blood pressures but asymptomatic. 3. Nausea and vomiting: Secondary to obstructive uropathy and acute kidney injury. Resolved. Poor appetite. 4. Hypercalcemia: Patient liberally takes Tums at home for unclear reasons. Holding HCTZ, calcium and vitamin D supplements. Despite aggressive IV normal saline hydration and creatinine normalized, persistent hypercalcemia (corrected calcium approximately 14.5 on 9/6). Status post pamidronate 60 mg IV 1 on 6/5 and may take a couple days to take effect. Changed calcitonin to IM as discussed with pharmacy and oncology. Also provided a dose of Lasix IV, continue. This was likely contributing to patient's mental status changes. Vitamin D, 25-hydroxy: 30.2. PTH related peptide: Pending. PTH: 16. Corrected serum calcium: 10.3. Calcium almost normal. 5. Anemia: Closely follow CBCs and transfuse if hemoglobin <7. As per oncology, likely related to methotrexate and anemia of chronic disease. Anemia panel: Iron 29, TIBC 214, saturation 14, ferritin 745, folate 9.6, B12: 767. Suggests anemia of chronic disease. As per oncology, transfuse if hemoglobin <7.5 g per DL. Hemoglobin 8.6. 6. Thrombocytopenia: Stable without bleeding. As per oncology, likely related to methotrexate or autoimmune mediated. 7. Hypokalemia: Replaced. Follow. 8. Elevated lactate: Likely due to dehydration and acute kidney injury.  Improved. 9. Acute kidney injury: As per admitting physician, creatinine was 0.93 on  08/13/17. Presented with creatinine of 2.08. Secondary to obstructive uropathy and dehydration. Creatinine normalized. Reduce IVF 10. HCAP, lobar pneumonia (left mid and lower lung): CT chest shows mild posterior lower lobe bronchiectasis with associated consolidation, uncertain if this is chronic consolidation or mild infiltrate. Developed fever of 101F on 9/7. Cultures drawn and initiated IV vancomycin and Zosyn. Pro-calcitonin 1.48. Repeat blood cultures from 9/7: Negative to date. Chest x-ray 9/8 confirmed pneumonia. 11. Essential hypertension: HCTZ held. Blood pressure is currently controlled off antihypertensives. 12. Gout: No acute flare. Continue allopurinol. 13. Hyperlipidemia: Statins. 14. Liver mass: CT abdomen showed awake hypodense mass with central calcification in the right hepatic lobe presumably corresponding to the MRI mass from 2011 which was thought to represent hemangioma. CT also showed moderate retroperitoneal somewhat bulky lymphadenopathy and splenomegaly raising concerns for metastatic disease or lymphoproliferative abnormality such as lymphoma. Oncology consulted 9/4 and indicate that the liver lesion is not suspicious for malignancy. As per oncology, based on initial RP lymph node biopsy, lymphoma is suspected but inadequate tissue sample and findings could also be due to lymphoproliferative disorder secondary to MTX or RA. Second biopsy off left [place] node done on 9/7-results to be reviewed on Monday. 15. Rheumatoid arthritis: Methotrexate.  16. Urethral/bladder mass: As per urology consultation, outpatient cystoscopy. Please see discussion in problem #1. 17. Acute metabolic encephalopathy: Likely related to acute kidney injury and hypercalcemia (likely the main cause). Avoid sedating medications. Encouraged sleep hygiene. No focal deficits. Mental status slowly improving but family still  concerned. After discussing with patient and family, ordered CT head to evaluate. 59. Fall: As per RN, sustained fall yesterday without injuries. PT evaluation. Fall precautions. 19. Nutrition: Poor oral intake as per family. Dietitian consulted. 20. Acute respiratory failure with hypoxia: May be related to bronchiectasis, pneumonia and atelectasis. Treat pneumonia. Incentive spirometry. Wean as tolerated.   DVT prophylaxis: SCDs Code Status: Full Family Communication: Discussed in detail with patient's 2 sisters and a RN friend at bedside in the morning and then subsequently with spouse and daughter later in the afternoon. Spent approximately 30 minutes in all in discussing with them. Disposition: To be determined pending clinical improvement. Will likely need SNF. Not medically stable already for discharge at this time.   Consultants:  Urology Medical oncology Interventional radiology   Procedures:  Right percutaneous nephrostomy Retroperitoneal lymph node biopsy 9/6. Left supraclavicular lymph node biopsy 9/7  Antimicrobials:  IV Rocephin-discontinued 9/5  IV vancomycin and Zosyn 9/7 >   Subjective: As per family, patient definitely more alert than she has been over the last couple of days, ongoing periods of confusion. Appetite remains poor. Denies dyspnea or cough. Patient wants to get out of bed.  ROS: No pain, nausea or vomiting  Objective:  Vitals:   08/25/17 1131 08/25/17 1842 08/25/17 2216 08/26/17 0539  BP:   110/60 98/60  Pulse:  75 94 95  Resp:  18 18 18   Temp:  98 F (36.7 C) 98.5 F (36.9 C) 99 F (37.2 C)  TempSrc:  Oral Oral Oral  SpO2: 93% 98% 94% 90%  Weight:   91.5 kg (201 lb 11.5 oz)   Height:        Examination:  General exam: Pleasant elderly female, moderately built and overweight, lying propped up in bed. Chronically ill-looking. Respiratory system: Reduced breath sounds in the bases with occasional basal crackles. Rest of lung fields clear  to auscultation. No increased work of breathing. Cardiovascular system: S1 and S2 heard, RRR. No JVD,  murmurs or pedal edema. Telemetry: Sinus rhythm. Stable Gastrointestinal system: Abdomen is nondistended, soft and nontender. No organomegaly or masses felt. Normal bowel sounds heard. Right percutaneous nephrostomy +. Stable Central nervous system: Alert and oriented 2. No focal neurological deficits. Mental status gradually improving. Extremities: Moves all extremities symmetrically. Skin: No rashes, lesions or ulcers Psychiatry: Judgement and insight impaired. Mood & affect pleasant.     Data Reviewed: I have personally reviewed following labs and imaging studies  CBC:  Recent Labs Lab 08/21/17 0036 08/21/17 0233 08/22/17 1051 08/23/17 0422 08/24/17 0454 08/25/17 0336  WBC 15.1* 15.9* 14.0* 14.3* 14.5* 14.4*  NEUTROABS 7.3  --   --   --   --   --   HGB 8.0* 9.1* 8.0* 7.7* 7.7* 8.6*  HCT 24.2* 27.9* 24.7* 23.8* 23.7* 27.1*  MCV 100.8* 100.7* 102.1* 103.0* 102.2* 103.0*  PLT 100* 111* 130* 120* 120* 539*   Basic Metabolic Panel:  Recent Labs Lab 08/22/17 1051 08/23/17 0422 08/24/17 0454 08/25/17 0336 08/26/17 0418  NA 137 137 139 142 141  K 3.8 3.5 3.2* 3.2* 3.4*  CL 105 105 105 108 109  CO2 26 24 28 27 27   GLUCOSE 85 76 110* 107* 102*  BUN 27* 31* 32* 31* 29*  CREATININE 1.34* 1.14* 1.05* 1.01* 1.12*  CALCIUM 13.3* 12.9* 11.6* 10.4* 8.5*   Liver Function Tests:  Recent Labs Lab 08/20/17 1931 08/21/17 0832 08/24/17 0454 08/25/17 0336 08/26/17 0418  AST 29  --   --   --   --   ALT 32  --   --   --   --   ALKPHOS 90  --   --   --   --   BILITOT 2.0*  --   --   --   --   PROT 5.2*  --   --   --   --   ALBUMIN 2.8* 2.0* 1.9* 2.0* 1.7*   Coagulation Profile:  Recent Labs Lab 08/20/17 2255  INR 1.16   Cardiac Enzymes: No results for input(s): CKTOTAL, CKMB, CKMBINDEX, TROPONINI in the last 168 hours. HbA1C: No results for input(s): HGBA1C in the  last 72 hours. CBG:  Recent Labs Lab 08/21/17 1149 08/22/17 0759 08/23/17 0738 08/23/17 1151  GLUCAP 79 84 84 96    Recent Results (from the past 240 hour(s))  Blood Culture (routine x 2)     Status: None   Collection Time: 08/20/17  8:10 PM  Result Value Ref Range Status   Specimen Description BLOOD RIGHT HAND  Final   Special Requests IN PEDIATRIC BOTTLE Blood Culture adequate volume  Final   Culture NO GROWTH 5 DAYS  Final   Report Status 08/25/2017 FINAL  Final  Blood Culture (routine x 2)     Status: None   Collection Time: 08/20/17  8:13 PM  Result Value Ref Range Status   Specimen Description BLOOD RIGHT ANTECUBITAL  Final   Special Requests   Final    BOTTLES DRAWN AEROBIC AND ANAEROBIC Blood Culture adequate volume   Culture NO GROWTH 5 DAYS  Final   Report Status 08/25/2017 FINAL  Final  Urine Culture     Status: None   Collection Time: 08/20/17  9:15 PM  Result Value Ref Range Status   Specimen Description URINE, CATHETERIZED  Final   Special Requests NONE  Final   Culture NO GROWTH  Final   Report Status 08/22/2017 FINAL  Final  Urine Culture     Status:  None   Collection Time: 08/21/17  2:32 PM  Result Value Ref Range Status   Specimen Description URINE, CATHETERIZED  URINE FROM DRAIN PLACEMENT  Final   Special Requests NONE  Final   Culture NO GROWTH  Final   Report Status 08/22/2017 FINAL  Final  Culture, blood (Routine X 2) w Reflex to ID Panel     Status: None (Preliminary result)   Collection Time: 08/24/17  6:13 PM  Result Value Ref Range Status   Specimen Description BLOOD LEFT HAND  Final   Special Requests   Final    IN PEDIATRIC BOTTLE Blood Culture results may not be optimal due to an excessive volume of blood received in culture bottles   Culture NO GROWTH 2 DAYS  Final   Report Status PENDING  Incomplete  Culture, blood (Routine X 2) w Reflex to ID Panel     Status: None (Preliminary result)   Collection Time: 08/24/17  6:14 PM  Result  Value Ref Range Status   Specimen Description BLOOD RIGHT HAND  Final   Special Requests   Final    BOTTLES DRAWN AEROBIC AND ANAEROBIC Blood Culture adequate volume   Culture NO GROWTH 2 DAYS  Final   Report Status PENDING  Incomplete  Urine Culture     Status: None   Collection Time: 08/24/17 11:01 PM  Result Value Ref Range Status   Specimen Description URINE, CLEAN CATCH  Final   Special Requests NONE  Final   Culture NO GROWTH  Final   Report Status 08/26/2017 FINAL  Final         Radiology Studies: US Guided Needle Placement  Result Date: 08/24/2017 INDICATION: 71 year old female with high-grade lymphoma of uncertain type. Repeat biopsy is requested to facilitate flow cytometry and ultimate diagnosis. EXAM: Ultrasound-guided biopsy left supraclavicular lymph node MEDICATIONS: None. ANESTHESIA/SEDATION: None FLUOROSCOPY TIME:  Fluoroscopy Time: 0 minutes 0 seconds (0 mGy). COMPLICATIONS: None immediate. PROCEDURE: Informed written consent was obtained from the patient after a thorough discussion of the procedural risks, benefits and alternatives. All questions were addressed. A timeout was performed prior to the initiation of the procedure. The left neck was interrogated with ultrasound. There are multiple prominent hypoechoic lymph nodes. The patient's breathing is hyperdynamic. The lymph nodes are in close proximity to the carotid artery. The region was sterilely prepped and draped in standard fashion using chlorhexidine skin prep. Local anesthesia was attained by infiltration with 1% lidocaine. A small dermatotomy was made. Under real-time sonographic guidance, multiple 18 gauge core biopsies were carefully obtained using a a [employer] automated biopsy device. The course the needle was identified on all needle passes confirming that biopsy specimens were obtained of the lymph nodes. There is no evidence of immediate complication. IMPRESSION: Technically successful ultrasound-guided  core biopsy of left supraclavicular adenopathy. Of note, the biopsy cores were fragmented and gelatinous. Numerous biopsy passes were obtained in an attempt to acquire enough material for testing. Electronically Signed   By: Jacqulynn Cadet M.D.   On: 08/24/2017 17:09   Dg Chest Port 1 View  Result Date: 08/25/2017 CLINICAL DATA:  Patient with fever. EXAM: PORTABLE CHEST 1 VIEW COMPARISON:  Chest radiograph 08/20/2017. FINDINGS: Monitoring leads overlie the patient. Enlarged cardiac and mediastinal contours. Patchy consolidative opacities within the left mid lower lung. Minimal right basilar atelectasis. No pleural effusion or pneumothorax. IMPRESSION: Patchy consolidation left mid and lower lung concerning for pneumonia in the appropriate clinical setting. Followup PA and lateral chest X-ray is  recommended in 3-4 weeks following trial of antibiotic therapy to ensure resolution and exclude underlying malignancy. Cardiomegaly. Electronically Signed   By: Lovey Newcomer M.D.   On: 08/25/2017 15:35        Scheduled Meds: . allopurinol  300 mg Oral Daily  . dextromethorphan-guaiFENesin  1 tablet Oral BID  . [START ON 08/27/2017] methotrexate  17.5 mg Oral Weekly  . pantoprazole  40 mg Oral Daily  . polyethylene glycol  17 g Oral Daily  . potassium chloride  40 mEq Oral BID  . senna  1 tablet Oral QHS  . simvastatin  20 mg Oral Daily   Continuous Infusions: . piperacillin-tazobactam (ZOSYN)  IV Stopped (08/26/17 1041)  . vancomycin Stopped (08/26/17 1146)     LOS: 6 days     HONGALGI,ANAND, MD, FACP, FHM. [facility] Pager [phone]  If 7PM-7AM, please contact night-coverage [email or web address] Password [facility] 08/26/2017, 3:43 PM

## 2017-08-26 NOTE — Progress Notes (Signed)
IV team nurse suggesting central or PICC line as 2 nurses tried and couldn't get peripheral IV access, notified the on call doctor and also the oncoming nurse.

## 2017-08-27 DIAGNOSIS — J181 Lobar pneumonia, unspecified organism: Secondary | ICD-10-CM

## 2017-08-27 DIAGNOSIS — D638 Anemia in other chronic diseases classified elsewhere: Secondary | ICD-10-CM

## 2017-08-27 DIAGNOSIS — R410 Disorientation, unspecified: Secondary | ICD-10-CM

## 2017-08-27 DIAGNOSIS — M109 Gout, unspecified: Secondary | ICD-10-CM

## 2017-08-27 DIAGNOSIS — R109 Unspecified abdominal pain: Secondary | ICD-10-CM

## 2017-08-27 LAB — BASIC METABOLIC PANEL
Anion gap: 5 (ref 5–15)
BUN: 32 mg/dL — ABNORMAL HIGH (ref 6–20)
CHLORIDE: 105 mmol/L (ref 101–111)
CO2: 25 mmol/L (ref 22–32)
CREATININE: 1.03 mg/dL — AB (ref 0.44–1.00)
Calcium: 7.8 mg/dL — ABNORMAL LOW (ref 8.9–10.3)
GFR, EST NON AFRICAN AMERICAN: 53 mL/min — AB (ref >60)
Glucose, Bld: 99 mg/dL (ref 65–99)
POTASSIUM: 4.2 mmol/L (ref 3.5–5.1)
SODIUM: 135 mmol/L (ref 135–145)

## 2017-08-27 LAB — PREPARE RBC (CROSSMATCH)

## 2017-08-27 LAB — CBC
HCT: 21.8 % — ABNORMAL LOW (ref 36.0–46.0)
HEMOGLOBIN: 7.2 g/dL — AB (ref 12.0–15.0)
MCH: 34.1 pg — AB (ref 26.0–34.0)
MCHC: 33 g/dL (ref 30.0–36.0)
MCV: 103.3 fL — ABNORMAL HIGH (ref 78.0–100.0)
Platelets: 100 10*3/uL — ABNORMAL LOW (ref 150–400)
RBC: 2.11 MIL/uL — AB (ref 3.87–5.11)
RDW: 18.3 % — ABNORMAL HIGH (ref 11.5–15.5)
WBC: 11.2 10*3/uL — ABNORMAL HIGH (ref 4.0–10.5)

## 2017-08-27 LAB — KAPPA/LAMBDA LIGHT CHAINS
KAPPA FREE LGHT CHN: 13.2 mg/L (ref 3.3–19.4)
KAPPA, LAMDA LIGHT CHAIN RATIO: 1 (ref 0.26–1.65)
LAMDA FREE LIGHT CHAINS: 13.2 mg/L (ref 5.7–26.3)

## 2017-08-27 LAB — ABO/RH: ABO/RH(D): B NEG

## 2017-08-27 LAB — ALBUMIN: ALBUMIN: 1.7 g/dL — AB (ref 3.5–5.0)

## 2017-08-27 LAB — GLUCOSE, CAPILLARY: Glucose-Capillary: 103 mg/dL — ABNORMAL HIGH (ref 65–99)

## 2017-08-27 LAB — PROCALCITONIN: PROCALCITONIN: 1.67 ng/mL

## 2017-08-27 MED ORDER — SODIUM CHLORIDE 0.9 % IV SOLN: Freq: Once | INTRAVENOUS | Status: DC

## 2017-08-27 NOTE — Progress Notes (Signed)
Pharmacy Antibiotic Note  Kristina Torres is a 71 y.o. female admitted on 08/20/2017 with general complaints of n/v, lightheadedness.  Pharmacy has been consulted for empiric vancomycin and zosyn dosing.  Patient recently on ceftriaxone for possible UTI but discontinued after negative cultures. Patient then spiked fever after biopsy on 9/7. Possible infiltrate on CXR.   Cultures remain negative to date.  Plan: Vancomycin 750mg  IV q12h.  Goal trough 15-20 mcg/mL. Zosyn 3.375g IV q8h EI Could consider de-escalation to Levaquin- would not treat longer than 7 days Follow renal function, clinical progression  Height: 4' 10.5" (148.6 cm) Weight: 210 lb (95.3 kg) IBW/kg (Calculated) : 42.05  Temp (24hrs), Avg:98.8 F (37.1 C), Min:98.2 F (36.8 C), Max:99.7 F (37.6 C)   Recent Labs Lab 08/20/17 1941 08/20/17 2251 08/20/17 2306  08/21/17 0233  08/21/17 1636 08/22/17 1051 08/23/17 0422 08/24/17 0454 08/25/17 0336 08/26/17 0418 08/27/17 0432  WBC  --   --   --   < > 15.9*  --   --  14.0* 14.3* 14.5* 14.4*  --  11.2*  CREATININE  --   --   --   < >  --   < >  --  1.34* 1.14* 1.05* 1.01* 1.12* 1.03*  LATICACIDVEN 2.70* 2.4* 2.32*  --  2.2*  --  2.1*  --   --   --   --   --   --   < > = values in this interval not displayed.  Estimated Creatinine Clearance: 50.1 mL/min (A) (by C-G formula based on SCr of 1.03 mg/dL (H)).    Allergies  Allergen Reactions  . Streptomycin Anaphylaxis   Vanc 9/7>> Zosyn 9/7>> CTX 9/3>>9/5  9/3 BCx: neg 9/3 urine: neg 9/4 urine: neg 9/7 BCx: ngtd 9/7 UCx: neg  Thank you for allowing pharmacy to be a part of this patient's care.  Minta Fair D. Pranathi Winfree, PharmD, BCPS Clinical Pharmacist Pager: [phone] Clinical Phone for 08/27/2017 until 3:30pm: [phone] If after 3:30pm, please call main pharmacy at [phone] 08/27/2017 2:20 PM

## 2017-08-27 NOTE — Progress Notes (Signed)
Physical Therapy Treatment Patient Details Name: Kristina Torres MRN: [id number] DOB: 03/31/1946 Today's Date: 08/27/2017    History of Present Illness 71 year old married female, PMH of HTN, HLD, gout, rheumatoid arthritis and renal mass presented to ED on 08/20/17 with complaints of nausea, nonbloody emesis, RUQ abdominal pain, lightheadedness, generalized weakness and cough.  Found to have UTI with renal blockage due to mass.  Urology consulted. IR placed right percutaneous nephrostomy.    PT Comments    Continuing work on functional mobility and activity tolerance;  Noting progress today, with pt standing approx 1 minute at RW; as well as pt's O2 sats stayed at or above 93% on Room Air   Follow Up Recommendations  SNF;Supervision/Assistance - 24 hour     Equipment Recommendations  Rolling walker with 5" wheels    Recommendations for Other Services       Precautions / Restrictions Precautions Precautions: Fall Restrictions Weight Bearing Restrictions: No    Mobility  Bed Mobility Overal bed mobility: Needs Assistance             General bed mobility comments: in chair upon arrival  Transfers Overall transfer level: Needs assistance Equipment used: Rolling walker (2 wheeled) Transfers: Sit to/from Stand Sit to Stand: Max assist         General transfer comment: Cues for safety and hand placement; Heavy assist to power up; verbal and tactile Cues for fully upright posture once standing  Ambulation/Gait             General Gait Details: Attempted -- unable to take steps today   Stairs            Wheelchair Mobility    Modified Rankin (Stroke Patients Only)       Balance             Standing balance-Leahy Scale: Poor                              Cognition Arousal/Alertness: Awake/alert (somewhat sleepy) Behavior During Therapy: Flat affect Overall Cognitive Status: Impaired/Different from baseline                                Problem Solving: Slow processing;Decreased initiation;Requires verbal cues;Requires tactile cues        Exercises General Exercises - Lower Extremity Long Arc Quad: AROM;Right;Left;5 reps (alternating)    General Comments General comments (skin integrity, edema, etc.): Session conducted on Room air, and pt's O2 sats remained greater than or equla to 93%; RN notified      Pertinent Vitals/Pain Pain Assessment: No/denies pain    Home Living                      Prior Function            PT Goals (current goals can now be found in the care plan section) Acute Rehab PT Goals Patient Stated Goal: To return to independent PT Goal Formulation: With patient/family Time For Goal Achievement: 09/05/17 Potential to Achieve Goals: Good Progress towards PT goals: Progressing toward goals (slowly)    Frequency    Min 3X/week      PT Plan Current plan remains appropriate    Co-evaluation              AM-PAC PT "6 Clicks" Daily Activity  Outcome Measure  Difficulty turning over in bed (  including adjusting bedclothes, sheets and blankets)?: A Lot Difficulty moving from lying on back to sitting on the side of the bed? : Unable Difficulty sitting down on and standing up from a chair with arms (e.g., wheelchair, bedside commode, etc,.)?: Unable Help needed moving to and from a bed to chair (including a wheelchair)?: A Lot Help needed walking in hospital room?: Total Help needed climbing 3-5 steps with a railing? : Total 6 Click Score: 8    End of Session Equipment Utilized During Treatment: Gait belt Activity Tolerance: Patient limited by fatigue Patient left: in chair;with call bell/phone within reach;with chair alarm set Nurse Communication: Mobility status;Need for lift equipment PT Visit Diagnosis: Other abnormalities of gait and mobility (R26.89);Unsteadiness on feet (R26.81);Muscle weakness (generalized) (M62.81);Pain Pain - part of  body:  (generalized)     Time: 7342-8768 PT Time Calculation (min) (ACUTE ONLY): 24 min  Charges:  $Therapeutic Activity: 23-37 mins                    G Codes:       Roney Marion, PT  Acute Rehabilitation Services Pager [phone] Office [phone]    Kristina Torres 08/27/2017, 1:18 PM

## 2017-08-27 NOTE — Progress Notes (Signed)
PROGRESS NOTE   Kristina Torres  ZOX:[id number]    DOB: Sep 12, 1946    DOA: 08/20/2017  PCP: [name], MD   I have briefly reviewed patients previous medical records in [facility].  Brief Narrative:  71 year old married female, PMH of HTN, HLD, gout, rheumatoid arthritis and renal mass presented to ED on 08/20/17 with complaints of nausea, nonbloody emesis, RUQ abdominal pain, lightheadedness, generalized weakness and cough. She was seen at [facility] on Tuesday prior to admission and found to have "right renal mass on ultrasound with blockage", started on oral ciprofloxacin for UTI and was supposed to see Urologist on Thursday with a CT scan. In the ED, transiently hypotensive, noted to have acute kidney injury with creatinine of 2.08, hypercalcemia with calcium of 13.87, oxygen saturation 90% on room air and CT abdomen demonstrated right hepatic lobe with hypodense mass, moderate retroperitoneal bulky lymphadenopathy, splenomegaly, severe right hydronephrosis and hydroureter. Urology consulted. IR placed right percutaneous nephrostomy. Oncology consulted and underwent retroperitoneal lymph node biopsy 9/6, preliminary pathology shows malignancy, suspicious for lymphoma but inadequate sample and hence oncology had IR do additional biopsy on 9/7-results pending. Hypercalcemia resolved. Mental status slowly improving.Awaiting further recommendations by oncology.  Assessment & Plan:   Principal Problem:   Nausea & vomiting Active Problems:   Rheumatoid arthritis ([place])   Essential hypertension   Gout   UTI (urinary tract infection)   Sepsis (HCC)   HLD (hyperlipidemia)   Hypotension   AKI (acute kidney injury) ([place])   Hypercalcemia   Thrombocytopenia (HCC)   Cough   Macrocytic anemia   Lymphadenopathy   1. Severe right hydronephrosis and hydroureter: Urology consulted 9/4 and recommended nephrostomy tube placement by IR which was done same day. Urine cultures negative.  Discussed with consulting Urologist on 9/7, recommends DC with percutaneous nephrostomy until outpatient follow-up with Dr. Karsten Ro regarding further evaluation including cystoscopy etc. He indicated that bladder mass could well be lymphoma. 2. Hypotension: Resolved. 3. Nausea and vomiting: Secondary to obstructive uropathy and acute kidney injury. Resolved. Poor appetite but may be slowly improving. 4. Hypercalcemia: May be secondary to underlying malignancy/lymphoma complicated by use of Tums, HCTZ, calcium and vitamin D supplements at home which have been held. Corrected calcium was as high as 14.5 on 9/6. Treated with IV normal saline, IV Lasix, a dose of pamidronate 60 mg on 6/5, IM calcitonin 4 doses. Vitamin D, 25-hydroxy: 30.2. PTH related peptide: Pending. PTH: 16. Resolved. 5. Anemia: As per oncology, likely related to methotrexate and anemia of chronic disease. Anemia panel: Iron 29, TIBC 214, saturation 14, ferritin 745, folate 9.6, B12: 767. As per oncology, transfuse if hemoglobin <7.5 g per DL. Hemoglobin down to 7.2 on 9/10. As discussed with oncology today, transfuse 1 unit of PRBC. Patient and spouse agreeable. Follow CBC in a.m.  6. Thrombocytopenia: Stable without bleeding. As per oncology, likely related to methotrexate or autoimmune mediated. 7. Hypokalemia: Replaced.  8. Elevated lactate: Likely due to dehydration and acute kidney injury. Improved. 9. Acute kidney injury: As per admitting physician, creatinine was 0.93 on 08/13/17. Presented with creatinine of 2.08. Secondary to obstructive uropathy and dehydration. Resolved.  10. HCAP, lobar pneumonia (left mid and lower lung): CT chest shows mild posterior lower lobe bronchiectasis with associated consolidation, uncertain if this is chronic consolidation or mild infiltrate. Developed fever of 101F on 9/7. Cultures drawn and initiated IV vancomycin and Zosyn. Pro-calcitonin 1.48 >1.67 . Repeat blood cultures from 9/7: Negative to  date. Chest x-ray 9/8 confirmed  pneumonia. Since cultures continue to be negative, DC vancomycin. Continue Zosyn but consider transitioning to oral antibiotics in the next day or so.  11. Essential hypertension: HCTZ held. Blood pressure is currently controlled off antihypertensives. 12. Gout: No acute flare. Continue allopurinol. 13. Hyperlipidemia: Statins. 14. Liver mass: CT abdomen showed vague hypodense mass with central calcification in the right hepatic lobe presumably corresponding to the MRI mass from 2011 which was thought to represent hemangioma. CT also showed moderate retroperitoneal somewhat bulky lymphadenopathy and splenomegaly raising concerns for metastatic disease or lymphoproliferative abnormality such as lymphoma. Oncology consulted 9/4 and indicate that the liver lesion is not suspicious for malignancy. As per oncology, based on initial RP lymph node biopsy, lymphoma is suspected but inadequate tissue sample and findings could also be due to lymphoproliferative disorder secondary to MTX or RA. Second biopsy off left [place] node done on 9/7, results pending. I discussed with Dr. Burr Medico who is coordinating with pathology regarding results and will follow-up patient later this evening.  15. Rheumatoid arthritis: Methotrexate.  16. Urethral/bladder mass: As per urology consultation, outpatient cystoscopy. Please see discussion in problem #1. 17. Acute metabolic encephalopathy: Likely related to acute kidney injury and hypercalcemia (likely the main cause) complicating underlying possible mild dementia. Avoid sedating medications. Encouraged sleep hygiene. No focal deficits. CT head without acute findings.  62. Fall: Sustained a few days ago in her hospital room. No injury sustained. As per PT, SNF at time of discharge. 19. Nutrition: Poor oral intake as per family. Dietitian consulted. Encouraged oral intake. May be slowly increasing. 20. Acute respiratory failure with hypoxia: May be related  to bronchiectasis, pneumonia and atelectasis. Treat pneumonia. Incentive spirometry. Wean as tolerated.   DVT prophylaxis: SCDs Code Status: Full Family Communication: Discussed in detail with patient's spouse at bedside. Updated care and answered questions.  Disposition: To be determined pending clinical improvement. Will likely need SNF. Not medically stable already for discharge at this time.   Consultants:  Urology Medical oncology Interventional radiology   Procedures:  Right percutaneous nephrostomy Retroperitoneal lymph node biopsy 9/6. Left supraclavicular lymph node biopsy 9/7  Antimicrobials:  IV Rocephin-discontinued 9/5  IV vancomycin (discontinued 9/10)  and Zosyn 9/7 >   Subjective: Seen this morning. Spouse at bedside. Patient denies complaints. Denies dyspnea or pain. Reports mild dry cough. As per spouse, mental status has slowly improved and may be better by 25%. Still with poor appetite but seems to be improving slowly. He does indicate that patient has had issues with memory deficits and not recognizing people that she should know, over the last couple months. As per RN, difficulty with peripheral IV access.   ROS: No pain, nausea or vomiting  Objective:  Vitals:   08/26/17 2346 08/27/17 0436 08/27/17 0800 08/27/17 1300  BP: 104/84 (!) 101/43 (!) 106/45   Pulse: 84 80 80   Resp: 17 18 18    Temp: 98.2 F (36.8 C) 99.7 F (37.6 C) 98.4 F (36.9 C)   TempSrc: Oral Oral Oral   SpO2: 94% 92% 92% 94%  Weight: 95.3 kg (210 lb)     Height:        Examination:  General exam: Pleasant elderly female, moderately built and overweight, sitting up in chair. Chronically ill-looking. Respiratory system: Reduced breath sounds in the bases with occasional basal crackles. Rest of lung fields clear to auscultation. No increased work of breathing. Stable. Cardiovascular system: S1 and S2 heard, RRR. No JVD, murmurs. Trace ankle edema. Stable. Gastrointestinal system:  Abdomen is nondistended, soft and nontender. No organomegaly or masses felt. Normal bowel sounds heard. Right percutaneous nephrostomy +. Stable Central nervous system: Alert and oriented 2. No focal neurological deficits. Mental status gradually improving. Extremities: Moves all extremities symmetrically. Skin: No rashes, lesions or ulcers Psychiatry: Judgement and insight impaired. Mood & affect pleasant.     Data Reviewed: I have personally reviewed following labs and imaging studies  CBC:  Recent Labs Lab 08/21/17 0036  08/22/17 1051 08/23/17 0422 08/24/17 0454 08/25/17 0336 08/27/17 0432  WBC 15.1*  < > 14.0* 14.3* 14.5* 14.4* 11.2*  NEUTROABS 7.3  --   --   --   --   --   --   HGB 8.0*  < > 8.0* 7.7* 7.7* 8.6* 7.2*  HCT 24.2*  < > 24.7* 23.8* 23.7* 27.1* 21.8*  MCV 100.8*  < > 102.1* 103.0* 102.2* 103.0* 103.3*  PLT 100*  < > 130* 120* 120* 113* 100*  < > = values in this interval not displayed. Basic Metabolic Panel:  Recent Labs Lab 08/23/17 0422 08/24/17 0454 08/25/17 0336 08/26/17 0418 08/27/17 0432  NA 137 139 142 141 135  K 3.5 3.2* 3.2* 3.4* 4.2  CL 105 105 108 109 105  CO2 24 28 27 27 25   GLUCOSE 76 110* 107* 102* 99  BUN 31* 32* 31* 29* 32*  CREATININE 1.14* 1.05* 1.01* 1.12* 1.03*  CALCIUM 12.9* 11.6* 10.4* 8.5* 7.8*   Liver Function Tests:  Recent Labs Lab 08/20/17 1931 08/21/17 0832 08/24/17 0454 08/25/17 0336 08/26/17 0418 08/27/17 0432  AST 29  --   --   --   --   --   ALT 32  --   --   --   --   --   ALKPHOS 90  --   --   --   --   --   BILITOT 2.0*  --   --   --   --   --   PROT 5.2*  --   --   --   --   --   ALBUMIN 2.8* 2.0* 1.9* 2.0* 1.7* 1.7*   Coagulation Profile:  Recent Labs Lab 08/20/17 2255  INR 1.16   CBG:  Recent Labs Lab 08/21/17 1149 08/22/17 0759 08/23/17 0738 08/23/17 1151 08/27/17 0748  GLUCAP 79 84 84 96 103*    Recent Results (from the past 240 hour(s))  Blood Culture (routine x 2)     Status:  None   Collection Time: 08/20/17  8:10 PM  Result Value Ref Range Status   Specimen Description BLOOD RIGHT HAND  Final   Special Requests IN PEDIATRIC BOTTLE Blood Culture adequate volume  Final   Culture NO GROWTH 5 DAYS  Final   Report Status 08/25/2017 FINAL  Final  Blood Culture (routine x 2)     Status: None   Collection Time: 08/20/17  8:13 PM  Result Value Ref Range Status   Specimen Description BLOOD RIGHT ANTECUBITAL  Final   Special Requests   Final    BOTTLES DRAWN AEROBIC AND ANAEROBIC Blood Culture adequate volume   Culture NO GROWTH 5 DAYS  Final   Report Status 08/25/2017 FINAL  Final  Urine Culture     Status: None   Collection Time: 08/20/17  9:15 PM  Result Value Ref Range Status   Specimen Description URINE, CATHETERIZED  Final   Special Requests NONE  Final   Culture NO GROWTH  Final   Report Status  08/22/2017 FINAL  Final  Urine Culture     Status: None   Collection Time: 08/21/17  2:32 PM  Result Value Ref Range Status   Specimen Description URINE, CATHETERIZED  URINE FROM DRAIN PLACEMENT  Final   Special Requests NONE  Final   Culture NO GROWTH  Final   Report Status 08/22/2017 FINAL  Final  Culture, blood (Routine X 2) w Reflex to ID Panel     Status: None (Preliminary result)   Collection Time: 08/24/17  6:13 PM  Result Value Ref Range Status   Specimen Description BLOOD LEFT HAND  Final   Special Requests   Final    IN PEDIATRIC BOTTLE Blood Culture results may not be optimal due to an excessive volume of blood received in culture bottles   Culture NO GROWTH 2 DAYS  Final   Report Status PENDING  Incomplete  Culture, blood (Routine X 2) w Reflex to ID Panel     Status: None (Preliminary result)   Collection Time: 08/24/17  6:14 PM  Result Value Ref Range Status   Specimen Description BLOOD RIGHT HAND  Final   Special Requests   Final    BOTTLES DRAWN AEROBIC AND ANAEROBIC Blood Culture adequate volume   Culture NO GROWTH 2 DAYS  Final   Report  Status PENDING  Incomplete  Urine Culture     Status: None   Collection Time: 08/24/17 11:01 PM  Result Value Ref Range Status   Specimen Description URINE, CLEAN CATCH  Final   Special Requests NONE  Final   Culture NO GROWTH  Final   Report Status 08/26/2017 FINAL  Final         Radiology Studies: Ct Head Wo Contrast  Result Date: 08/26/2017 CLINICAL DATA:  Confusion. EXAM: CT HEAD WITHOUT CONTRAST TECHNIQUE: Contiguous axial images were obtained from the base of the skull through the vertex without intravenous contrast. COMPARISON:  None. FINDINGS: Brain: There is no evidence of acute infarct, intracranial hemorrhage, mass, midline shift, or extra-axial fluid collection. The ventricles and sulci are normal for age. Minimal low density in the deep cerebral white matter is nonspecific but may reflect minimal chronic small vessel ischemia, not greater than expected for patient's age. Vascular: Mild calcified atherosclerosis at the skullbase. No hyperdense vessel. Skull: No fracture focal osseous lesion. Sinuses/Orbits: Left maxillary sinus mucous retention cysts. Clear mastoid air cells. Unremarkable orbits. Other: None. IMPRESSION: No evidence of acute intracranial abnormality. Unremarkable CT appearance of the brain for age. Electronically Signed   By: Logan Bores M.D.   On: 08/26/2017 18:26        Scheduled Meds: . allopurinol  300 mg Oral Daily  . dextromethorphan-guaiFENesin  1 tablet Oral BID  . methotrexate  17.5 mg Oral Weekly  . pantoprazole  40 mg Oral Daily  . polyethylene glycol  17 g Oral Daily  . senna  1 tablet Oral QHS  . simvastatin  20 mg Oral Daily   Continuous Infusions: . sodium chloride    . piperacillin-tazobactam (ZOSYN)  IV 3.375 g (08/27/17 1415)     LOS: 7 days     Ellenora Talton, MD, FACP, FHM. [facility] Pager [phone]  If 7PM-7AM, please contact night-coverage [email or web address] Password [email or web address] 08/27/2017, 4:02 PM

## 2017-08-27 NOTE — Progress Notes (Signed)
Kristina Torres   DOB:[birth year]   WE#:[id number]   YPP#:[id number]  Hematology and Oncology service follow up note   Subjective: Pt is more awake, alert, although still has mild intermittent confusion, per her husband. Hb drops to 7.2g/dl this morning, no clinical bleeding. No more fever  Objective:  Vitals:   08/27/17 1718 08/27/17 2104  BP: (!) 92/45 (!) 102/46  Pulse: 83 88  Resp: 18 18  Temp: 99.7 F (37.6 C) 100.3 F (37.9 C)  SpO2: 97% 91%    Body mass index is 43.25 kg/m.  Intake/Output Summary (Last 24 hours) at 08/27/17 2118 Last data filed at 08/27/17 2106  Gross per 24 hour  Intake              580 ml  Output              825 ml  Net             -245 ml     Sclerae unicteric  Oropharynx clear  No peripheral adenopathy  Lungs clear -- no rales or rhonchi  Heart regular rate and rhythm  Abdomen benign  MSK no focal spinal tenderness, no peripheral edema  Neuro nonfocal, somnolent    CBG (last 3)   Recent Labs  08/27/17 0748  GLUCAP 103*     Labs:  Urine Studies No results for input(s): UHGB, CRYS in the last 72 hours.  Invalid input(s): UACOL, UAPR, USPG, UPH, UTP, UGL, UKET, UBIL, UNIT, UROB, [place], UEPI, UWBC, Duwayne Heck [place], [place]  Basic Metabolic Panel:  Recent Labs Lab 08/23/17 0422 08/24/17 0454 08/25/17 0336 08/26/17 0418 08/27/17 0432  NA 137 139 142 141 135  K 3.5 3.2* 3.2* 3.4* 4.2  CL 105 105 108 109 105  CO2 24 28 27 27 25   GLUCOSE 76 110* 107* 102* 99  BUN 31* 32* 31* 29* 32*  CREATININE 1.14* 1.05* 1.01* 1.12* 1.03*  CALCIUM 12.9* 11.6* 10.4* 8.5* 7.8*   GFR Estimated Creatinine Clearance: 50.2 mL/min (A) (by C-G formula based on SCr of 1.03 mg/dL (H)). Liver Function Tests:  Recent Labs Lab 08/21/17 0832 08/24/17 0454 08/25/17 0336 08/26/17 0418 08/27/17 0432  ALBUMIN 2.0* 1.9* 2.0* 1.7* 1.7*    Recent Labs Lab 08/20/17 2255  LIPASE 22   No results for input(s): AMMONIA in the last 168  hours. Coagulation profile  Recent Labs Lab 08/20/17 2255  INR 1.16    CBC:  Recent Labs Lab 08/21/17 0036  08/22/17 1051 08/23/17 0422 08/24/17 0454 08/25/17 0336 08/27/17 0432  WBC 15.1*  < > 14.0* 14.3* 14.5* 14.4* 11.2*  NEUTROABS 7.3  --   --   --   --   --   --   HGB 8.0*  < > 8.0* 7.7* 7.7* 8.6* 7.2*  HCT 24.2*  < > 24.7* 23.8* 23.7* 27.1* 21.8*  MCV 100.8*  < > 102.1* 103.0* 102.2* 103.0* 103.3*  PLT 100*  < > 130* 120* 120* 113* 100*  < > = values in this interval not displayed. Cardiac Enzymes: No results for input(s): CKTOTAL, CKMB, CKMBINDEX, TROPONINI in the last 168 hours. BNP: Invalid input(s): POCBNP CBG:  Recent Labs Lab 08/21/17 1149 08/22/17 0759 08/23/17 0738 08/23/17 1151 08/27/17 0748  GLUCAP 79 84 84 96 103*   D-Dimer No results for input(s): DDIMER in the last 72 hours. Hgb A1c No results for input(s): HGBA1C in the last 72 hours. Lipid Profile No results for input(s): CHOL, HDL, LDLCALC, TRIG, CHOLHDL,  LDLDIRECT in the last 72 hours. Thyroid function studies No results for input(s): TSH, T4TOTAL, T3FREE, THYROIDAB in the last 72 hours.  Invalid input(s): FREET3 Anemia work up No results for input(s): VITAMINB12, FOLATE, FERRITIN, TIBC, IRON, RETICCTPCT in the last 72 hours. Microbiology Recent Results (from the past 240 hour(s))  Blood Culture (routine x 2)     Status: None   Collection Time: 08/20/17  8:10 PM  Result Value Ref Range Status   Specimen Description BLOOD RIGHT HAND  Final   Special Requests IN PEDIATRIC BOTTLE Blood Culture adequate volume  Final   Culture NO GROWTH 5 DAYS  Final   Report Status 08/25/2017 FINAL  Final  Blood Culture (routine x 2)     Status: None   Collection Time: 08/20/17  8:13 PM  Result Value Ref Range Status   Specimen Description BLOOD RIGHT ANTECUBITAL  Final   Special Requests   Final    BOTTLES DRAWN AEROBIC AND ANAEROBIC Blood Culture adequate volume   Culture NO GROWTH 5 DAYS   Final   Report Status 08/25/2017 FINAL  Final  Urine Culture     Status: None   Collection Time: 08/20/17  9:15 PM  Result Value Ref Range Status   Specimen Description URINE, CATHETERIZED  Final   Special Requests NONE  Final   Culture NO GROWTH  Final   Report Status 08/22/2017 FINAL  Final  Urine Culture     Status: None   Collection Time: 08/21/17  2:32 PM  Result Value Ref Range Status   Specimen Description URINE, CATHETERIZED  URINE FROM DRAIN PLACEMENT  Final   Special Requests NONE  Final   Culture NO GROWTH  Final   Report Status 08/22/2017 FINAL  Final  Culture, blood (Routine X 2) w Reflex to ID Panel     Status: None (Preliminary result)   Collection Time: 08/24/17  6:13 PM  Result Value Ref Range Status   Specimen Description BLOOD LEFT HAND  Final   Special Requests   Final    IN PEDIATRIC BOTTLE Blood Culture results may not be optimal due to an excessive volume of blood received in culture bottles   Culture NO GROWTH 3 DAYS  Final   Report Status PENDING  Incomplete  Culture, blood (Routine X 2) w Reflex to ID Panel     Status: None (Preliminary result)   Collection Time: 08/24/17  6:14 PM  Result Value Ref Range Status   Specimen Description BLOOD RIGHT HAND  Final   Special Requests   Final    BOTTLES DRAWN AEROBIC AND ANAEROBIC Blood Culture adequate volume   Culture NO GROWTH 3 DAYS  Final   Report Status PENDING  Incomplete  Urine Culture     Status: None   Collection Time: 08/24/17 11:01 PM  Result Value Ref Range Status   Specimen Description URINE, CLEAN CATCH  Final   Special Requests NONE  Final   Culture NO GROWTH  Final   Report Status 08/26/2017 FINAL  Final      Studies:  Ct Head Wo Contrast  Result Date: 08/26/2017 CLINICAL DATA:  Confusion. EXAM: CT HEAD WITHOUT CONTRAST TECHNIQUE: Contiguous axial images were obtained from the base of the skull through the vertex without intravenous contrast. COMPARISON:  None. FINDINGS: Brain: There is  no evidence of acute infarct, intracranial hemorrhage, mass, midline shift, or extra-axial fluid collection. The ventricles and sulci are normal for age. Minimal low density in the deep cerebral white matter  is nonspecific but may reflect minimal chronic small vessel ischemia, not greater than expected for patient's age. Vascular: Mild calcified atherosclerosis at the skullbase. No hyperdense vessel. Skull: No fracture focal osseous lesion. Sinuses/Orbits: Left maxillary sinus mucous retention cysts. Clear mastoid air cells. Unremarkable orbits. Other: None. IMPRESSION: No evidence of acute intracranial abnormality. Unremarkable CT appearance of the brain for age. Electronically Signed   By: Logan Bores M.D.   On: 08/26/2017 18:26    Assessment: Kristina Torres is a 71 y.o. female with a history of HTN, gout, HLD and Rheumatoid Arthritis on MTX, presented with severe right hydronephrosis, fatigue, nausea,.and weakness    1. Liver Mass in Right hepatic Lobe and PR adenopathy , rule out lymphoma or malignancy 2. Macrocytic anemia, likely secondary to MTX and anemia of chronic disease, worsening  3. Mild thrombocytopenia, possibly related to MTX, or autoimmune mediated 4. RA 5. HTN  6. Right hydronephrosis secondary to ureteral obstruction, status post nephrostomy tube placement 7. Severe hypercalcemia, resolved  8. Nausea and vomiting, anorexia  9. Metabolic encephalopathy, likely secondary to hypercalcemia, improved 10. HCAP, on antibiotics   Plan:  -her left supraclavicular lymph node biopsy material was very limited, fragmented, pathologist Dr. Gari Crown is running flow cytometry, and will review the slides tomorrow. No preliminary report yet. -If her lymph node biopsy are nondiagnostic, giving the mild adenopathy, I'll recommend clinical follow-up, and repeat scan in 4-8 weeks -Methotrexate-induced lymphoproliferative disorder is not ruled out, I will recommend holding MTX for the next few  months, before we repeat scan. Her previous rheumatologist has retired, she would need a new rheumatologist as soon as possible if we need to hold methotrexate, which means she will probably need alternative therapies. Please arrange urgent rheumatology follow-up upon discharge if possible. -I recommend blood transfusion to keep hemoglobin above 8.0, 1u RBC was ordered today  -she is likely going to need rehab, pt's husband would prefer a rehab in [place]  -I will plan to see her back in my clinic in 2-3 weeks, will set up her f/u upon discharge  -Further recommendations will be determined by the final node biopsy results, hopefully we will have in a couple of days.    Truitt Merle, MD 08/27/2017  9:18 PM

## 2017-08-28 DIAGNOSIS — R05 Cough: Secondary | ICD-10-CM

## 2017-08-28 LAB — BASIC METABOLIC PANEL
Anion gap: 7 (ref 5–15)
BUN: 30 mg/dL — AB (ref 6–20)
CALCIUM: 7.4 mg/dL — AB (ref 8.9–10.3)
CO2: 22 mmol/L (ref 22–32)
CREATININE: 1.01 mg/dL — AB (ref 0.44–1.00)
Chloride: 103 mmol/L (ref 101–111)
GFR calc Af Amer: >60 mL/min (ref >60)
GFR, EST NON AFRICAN AMERICAN: 55 mL/min — AB (ref >60)
GLUCOSE: 101 mg/dL — AB (ref 65–99)
Potassium: 3.7 mmol/L (ref 3.5–5.1)
SODIUM: 132 mmol/L — AB (ref 135–145)

## 2017-08-28 LAB — BPAM RBC
BLOOD PRODUCT EXPIRATION DATE: 201809142359
ISSUE DATE / TIME: 201809102300
Unit Type and Rh: 1700

## 2017-08-28 LAB — CBC
HCT: 25.8 % — ABNORMAL LOW (ref 36.0–46.0)
Hemoglobin: 8.5 g/dL — ABNORMAL LOW (ref 12.0–15.0)
MCH: 32.4 pg (ref 26.0–34.0)
MCHC: 32.9 g/dL (ref 30.0–36.0)
MCV: 98.5 fL (ref 78.0–100.0)
PLATELETS: 80 10*3/uL — AB (ref 150–400)
RBC: 2.62 MIL/uL — ABNORMAL LOW (ref 3.87–5.11)
RDW: 19.9 % — AB (ref 11.5–15.5)
WBC: 9.3 10*3/uL (ref 4.0–10.5)

## 2017-08-28 LAB — TYPE AND SCREEN
ABO/RH(D): B NEG
ANTIBODY SCREEN: NEGATIVE
UNIT DIVISION: 0

## 2017-08-28 LAB — MULTIPLE MYELOMA PANEL, SERUM
ALBUMIN SERPL ELPH-MCNC: 1.9 g/dL — AB (ref 2.9–4.4)
ALPHA 1: 0.3 g/dL (ref 0.0–0.4)
Albumin/Glob SerPl: 1.2 (ref 0.7–1.7)
Alpha2 Glob SerPl Elph-Mcnc: 0.5 g/dL (ref 0.4–1.0)
B-Globulin SerPl Elph-Mcnc: 0.5 g/dL — ABNORMAL LOW (ref 0.7–1.3)
Gamma Glob SerPl Elph-Mcnc: 0.4 g/dL (ref 0.4–1.8)
Globulin, Total: 1.6 g/dL — ABNORMAL LOW (ref 2.2–3.9)
IGA: 62 mg/dL — AB (ref 64–422)
IGM (IMMUNOGLOBULIN M), SRM: 32 mg/dL (ref 26–217)
IgG (Immunoglobin G), Serum: 503 mg/dL — ABNORMAL LOW (ref 700–1600)
Total Protein ELP: 3.5 g/dL — ABNORMAL LOW (ref 6.0–8.5)

## 2017-08-28 LAB — ALBUMIN: ALBUMIN: 1.8 g/dL — AB (ref 3.5–5.0)

## 2017-08-28 MED ORDER — LEVOFLOXACIN 500 MG PO TABS
500.0000 mg | ORAL_TABLET | Freq: Every day | ORAL | Status: AC
  Administered 2017-08-28 – 2017-08-31 (×4): 500 mg via ORAL
  Filled 2017-08-28 (×4): qty 1

## 2017-08-28 MED ORDER — LEVOFLOXACIN 25 MG/ML PO SOLN
500.0000 mg | Freq: Every day | ORAL | Status: DC
  Filled 2017-08-28 (×2): qty 20

## 2017-08-28 NOTE — Progress Notes (Signed)
Physical Therapy Treatment Patient Details Name: Kristina Torres MRN: [id number] DOB: 30-Dec-1945 Today's Date: 08/28/2017    History of Present Illness 71 year old married female, PMH of HTN, HLD, gout, rheumatoid arthritis and renal mass presented to ED on 08/20/17 with complaints of nausea, nonbloody emesis, RUQ abdominal pain, lightheadedness, generalized weakness and cough.  Found to have UTI with renal blockage due to mass.  Urology consulted. IR placed right percutaneous nephrostomy.    PT Comments    Continuing work on functional mobility and activity tolerance;  Session focused on transfers and standing tolerance, and employing Stedy as a safe option for transfers out of bed and back to bed; Overall improving standing tolerance compared to yesterday's session; very fatigued in standing; BPs as follows:     08/28/17 1000 08/28/17 1004 08/28/17 1006  Vital Signs  Pulse Rate (!) 110 97 97  BP (!) 102/52 (!) 88/49 (!) 91/50  Patient Position (if appropriate) Standing (within [place]) Sitting Sitting (reclined)     08/28/17 1009  Vital Signs  Pulse Rate 92  BP (!) 90/46  Patient Position (if appropriate) Sitting (Reclined)      Follow Up Recommendations  SNF;Supervision/Assistance - 24 hour     Equipment Recommendations  Rolling walker with 5" wheels    Recommendations for Other Services       Precautions / Restrictions Precautions Precautions: Fall Restrictions Weight Bearing Restrictions: No    Mobility  Bed Mobility               General bed mobility comments: in chair upon arrival  Transfers Overall transfer level: Needs assistance Equipment used:  [facility] lift equipment used) Transfers: Sit to/from Stand Sit to Stand: Mod assist         General transfer comment: Cues for safety and hand placement; Heavy assist to power up; verbal and tactile Cues for fully upright posture once standing; cues also to self-monitor for activity tolerance; Fatigued  quickly in standing, and sat to high Stedy seat; performed 3 sit to stands from high stedy seat with min assist  Ambulation/Gait                 Stairs            Wheelchair Mobility    Modified Rankin (Stroke Patients Only)       Balance             Standing balance-Leahy Scale: Poor                              Cognition Arousal/Alertness: Awake/alert Behavior During Therapy: Flat affect Overall Cognitive Status: Impaired/Different from baseline Area of Impairment: Attention;Following commands;Problem solving                   Current Attention Level: Sustained   Following Commands: Follows one step commands with increased time     Problem Solving: Slow processing;Decreased initiation;Requires verbal cues;Requires tactile cues General Comments: Did not remember demostration of use of Stedy, which was done just before using it -- likely decr recall is related to decr attention      Exercises      General Comments        Pertinent Vitals/Pain Pain Assessment: No/denies pain    Home Living                      Prior Function  PT Goals (current goals can now be found in the care plan section) Acute Rehab PT Goals Patient Stated Goal: To return to independent PT Goal Formulation: With patient/family Time For Goal Achievement: 09/05/17 Potential to Achieve Goals: Good Progress towards PT goals: Progressing toward goals    Frequency    Min 3X/week      PT Plan Current plan remains appropriate    Co-evaluation              AM-PAC PT "6 Clicks" Daily Activity  Outcome Measure  Difficulty turning over in bed (including adjusting bedclothes, sheets and blankets)?: A Lot Difficulty moving from lying on back to sitting on the side of the bed? : Unable Difficulty sitting down on and standing up from a chair with arms (e.g., wheelchair, bedside commode, etc,.)?: Unable Help needed moving to  and from a bed to chair (including a wheelchair)?: A Lot Help needed walking in hospital room?: A Lot Help needed climbing 3-5 steps with a railing? : Total 6 Click Score: 9    End of Session Equipment Utilized During Treatment: Gait belt Activity Tolerance: Patient limited by fatigue Patient left: in chair;with call bell/phone within reach Nurse Communication: Mobility status;Need for lift equipment PT Visit Diagnosis: Other abnormalities of gait and mobility (R26.89);Unsteadiness on feet (R26.81);Muscle weakness (generalized) (M62.81);Pain Pain - part of body:  (generalized)     Time: 3953-2023 PT Time Calculation (min) (ACUTE ONLY): 32 min  Charges:  $Therapeutic Activity: 23-37 mins                    G Codes:       Roney Marion, PT  Acute Rehabilitation Services Pager [phone] Office [phone]    Kristina Torres 08/28/2017, 11:15 AM

## 2017-08-28 NOTE — Progress Notes (Signed)
Referring Physician(s): Dr Bonner Puna Dr Ky Barban   Supervising Physician: Corrie Mckusick  Patient Status:  [facility] - In-pt  Chief Complaint:  Rt hydronephrosis Obstructive LAN  Subjective:  Right PCN in place Draining well Yellow urine in bag 300-500 daily from PCN Up in chair; pleasant   Allergies: Streptomycin  Medications: Prior to Admission medications   Medication Sig Start Date End Date Taking? Authorizing Provider  allopurinol (ZYLOPRIM) 300 MG tablet Take 300 mg by mouth daily. 05/19/17  Yes [provider]  Calcium Carb-Cholecalciferol (CALCIUM 1000 + D PO) Take 1 tablet by mouth daily.   Yes [provider]  ciprofloxacin (CIPRO) 250 MG tablet Take 250 mg by mouth 2 (two) times daily. 08/15/17  Yes [provider]  hydrochlorothiazide (MICROZIDE) 12.5 MG capsule Take 12.5 mg by mouth daily.  07/19/17  Yes [provider]  methotrexate (RHEUMATREX) 2.5 MG tablet Take 17.5 mg by mouth once a week. Take 7 tabs (2.5mg  tablets) qMonday 05/20/17  Yes [provider]  ondansetron (ZOFRAN-ODT) 4 MG disintegrating tablet Take 4 mg by mouth as needed. 08/14/17  Yes [provider]  simvastatin (ZOCOR) 20 MG tablet Take 20 mg by mouth daily. 08/17/17  Yes [provider]     Vital Signs: BP (!) 101/47   Pulse 91   Temp 99 F (37.2 C) (Oral)   Resp 18   Ht 4' 10.5" (1.486 m)   Wt 210 lb 8.6 oz (95.5 kg)   SpO2 96%   BMI 43.25 kg/m   Physical Exam  Neurological: She is alert.  Skin: Skin is warm and dry.  Site of R PCN is clan and dry NT no bleeding OP is clear yellow: 100 cc in bag Cr almost wnl  Nursing note and vitals reviewed.   Imaging: Ct Head Wo Contrast  Result Date: 08/26/2017 CLINICAL DATA:  Confusion. EXAM: CT HEAD WITHOUT CONTRAST TECHNIQUE: Contiguous axial images were obtained from the base of the skull through the vertex without intravenous contrast. COMPARISON:  None. FINDINGS: Brain: There  is no evidence of acute infarct, intracranial hemorrhage, mass, midline shift, or extra-axial fluid collection. The ventricles and sulci are normal for age. Minimal low density in the deep cerebral white matter is nonspecific but may reflect minimal chronic small vessel ischemia, not greater than expected for patient's age. Vascular: Mild calcified atherosclerosis at the skullbase. No hyperdense vessel. Skull: No fracture focal osseous lesion. Sinuses/Orbits: Left maxillary sinus mucous retention cysts. Clear mastoid air cells. Unremarkable orbits. Other: None. IMPRESSION: No evidence of acute intracranial abnormality. Unremarkable CT appearance of the brain for age. Electronically Signed   By: Logan Bores M.D.   On: 08/26/2017 18:26   US Guided Needle Placement  Result Date: 08/24/2017 INDICATION: 71 year old female with high-grade lymphoma of uncertain type. Repeat biopsy is requested to facilitate flow cytometry and ultimate diagnosis. EXAM: Ultrasound-guided biopsy left supraclavicular lymph node MEDICATIONS: None. ANESTHESIA/SEDATION: None FLUOROSCOPY TIME:  Fluoroscopy Time: 0 minutes 0 seconds (0 mGy). COMPLICATIONS: None immediate. PROCEDURE: Informed written consent was obtained from the patient after a thorough discussion of the procedural risks, benefits and alternatives. All questions were addressed. A timeout was performed prior to the initiation of the procedure. The left neck was interrogated with ultrasound. There are multiple prominent hypoechoic lymph nodes. The patient's breathing is hyperdynamic. The lymph nodes are in close proximity to the carotid artery. The region was sterilely prepped and draped in standard fashion using chlorhexidine skin prep. Local anesthesia was  attained by infiltration with 1% lidocaine. A small dermatotomy was made. Under real-time sonographic guidance, multiple 18 gauge core biopsies were carefully obtained using a a [employer] automated biopsy device. The  course the needle was identified on all needle passes confirming that biopsy specimens were obtained of the lymph nodes. There is no evidence of immediate complication. IMPRESSION: Technically successful ultrasound-guided core biopsy of left supraclavicular adenopathy. Of note, the biopsy cores were fragmented and gelatinous. Numerous biopsy passes were obtained in an attempt to acquire enough material for testing. Electronically Signed   By: Jacqulynn Cadet M.D.   On: 08/24/2017 17:09   Dg Chest Port 1 View  Result Date: 08/25/2017 CLINICAL DATA:  Patient with fever. EXAM: PORTABLE CHEST 1 VIEW COMPARISON:  Chest radiograph 08/20/2017. FINDINGS: Monitoring leads overlie the patient. Enlarged cardiac and mediastinal contours. Patchy consolidative opacities within the left mid lower lung. Minimal right basilar atelectasis. No pleural effusion or pneumothorax. IMPRESSION: Patchy consolidation left mid and lower lung concerning for pneumonia in the appropriate clinical setting. Followup PA and lateral chest X-ray is recommended in 3-4 weeks following trial of antibiotic therapy to ensure resolution and exclude underlying malignancy. Cardiomegaly. Electronically Signed   By: Lovey Newcomer M.D.   On: 08/25/2017 15:35    Labs:  CBC:  Recent Labs  08/24/17 0454 08/25/17 0336 08/27/17 0432 08/28/17 0825  WBC 14.5* 14.4* 11.2* 9.3  HGB 7.7* 8.6* 7.2* 8.5*  HCT 23.7* 27.1* 21.8* 25.8*  PLT 120* 113* 100* 80*    COAGS:  Recent Labs  08/20/17 2255  INR 1.16  APTT 24    BMP:  Recent Labs  08/25/17 0336 08/26/17 0418 08/27/17 0432 08/28/17 0825  NA 142 141 135 132*  K 3.2* 3.4* 4.2 3.7  CL 108 109 105 103  CO2 27 27 25 22   GLUCOSE 107* 102* 99 101*  BUN 31* 29* 32* 30*  CALCIUM 10.4* 8.5* 7.8* 7.4*  CREATININE 1.01* 1.12* 1.03* 1.01*  GFRNONAA 55* 48* 53* 55*  GFRAA >60 56* >60 >60    LIVER FUNCTION TESTS:  Recent Labs  08/20/17 1931  08/25/17 0336 08/26/17 0418  08/27/17 0432 08/28/17 0825  BILITOT 2.0*  --   --   --   --   --   AST 29  --   --   --   --   --   ALT 32  --   --   --   --   --   ALKPHOS 90  --   --   --   --   --   PROT 5.2*  --   --   --   --   --   ALBUMIN 2.8*  < > 2.0* 1.7* 1.7* 1.8*  < > = values in this interval not displayed.  Assessment and Plan:  R PCN in place Plan per Urology  Electronically Signed: Monia Sabal A, PA-C 08/28/2017, 10:25 AM   I spent a total of 15 Minutes at the the patient's bedside AND on the patient's hospital floor or unit, greater than 50% of which was counseling/coordinating care for R PCN

## 2017-08-28 NOTE — Care Management Important Message (Signed)
Important Message  Patient Details  Name: Kristina Torres MRN: [id number] Date of Birth: 05/23/1946   Medicare Important Message Given:  Yes    Nathen May 08/28/2017, 8:54 AM

## 2017-08-28 NOTE — Progress Notes (Signed)
PROGRESS NOTE   Kristina Torres  EXH:[id number]    DOB: April 15, 1946    DOA: 08/20/2017  PCP: [name], MD   Brief Narrative:  Kristina Torres is a 71 y.o. female, PMH of HTN, HLD, gout, RA on MTX and renal mass presented to ED on 08/20/17 with complaints of nausea, nonbloody emesis, RUQ abdominal pain, lightheadedness, generalized weakness and cough. She was seen at [facility] on Tuesday prior to admission and found to have "right renal mass on ultrasound with blockage", started on oral ciprofloxacin for UTI and was supposed to see Urologist on Thursday with a CT scan. In the ED, transiently hypotensive, noted to have acute kidney injury with creatinine of 2.08, hypercalcemia with calcium of 13.87, oxygen saturation 90% on room air and CT abdomen demonstrated right hepatic lobe with hypodense mass, moderate retroperitoneal bulky lymphadenopathy, splenomegaly, severe right hydronephrosis and hydroureter. Urology consulted. IR placed right percutaneous nephrostomy. Oncology consulted and underwent retroperitoneal lymph node biopsy 9/6, preliminary pathology shows malignancy, suspicious for lymphoma but inadequate sample and hence oncology had IR do additional biopsy on 9/7-results pending. Hypercalcemia resolved. Mental status slowly improving.  Assessment & Plan:  Principal Problem:   Nausea & vomiting Active Problems:   Rheumatoid arthritis ([place])   Essential hypertension   Gout   UTI (urinary tract infection)   Sepsis (HCC)   HLD (hyperlipidemia)   Hypotension   AKI (acute kidney injury) ([place])   Hypercalcemia   Thrombocytopenia (HCC)   Cough   Macrocytic anemia   Lymphadenopathy  Severe right hydronephrosis and hydroureter: Urology consulted 9/4 and recommended nephrostomy tube placement by IR which was done same day. Urine cultures negative.  - Discussed with consulting Urologist on 9/7, recommends DC with percutaneous nephrostomy until outpatient follow-up with Dr. Karsten Ro  regarding further evaluation including cystoscopy etc. He indicated that bladder mass could well be lymphoma.  Nausea and vomiting: Secondary to obstructive uropathy and acute kidney injury. - Resolved.   Hypercalcemia: May be secondary to underlying malignancy/lymphoma complicated by use of Tums, HCTZ, calcium and vitamin D supplements at home which have been held. Corrected calcium was as high as 14.5 on 9/6. Treated with IV normal saline, IV Lasix, a dose of pamidronate 60 mg on 6/5, IM calcitonin 4 doses. Vitamin D, 25-hydroxy: 30.2. PTH related peptide: Pending. PTH: 16.  - Resolved.  Anemia: As per oncology, likely related to methotrexate and anemia of chronic disease. Anemia panel: Iron 29, TIBC 214, saturation 14, ferritin 745, folate 9.6, B12: 767.  - As per oncology, transfuse to maintain hgb >8mg /dL. Transfused 1u PRBC 9/10. This seemed to improve mentation considerably. - Recheck CBC 9/12.  Patient and spouse agreeable. Follow CBC in a.m.  - Hold methotrexate per oncology  Thrombocytopenia: Stable without bleeding. As per oncology, likely related to methotrexate or autoimmune mediated. - Monitor  Hypokalemia: Replaced.   Elevated lactate: Likely due to dehydration and acute kidney injury. Improved.  AKI: SCr 2.08 on admission up from baseline of 0.9 - 1.0 due to obstructive uropathy.  - Resolved with PCN.   Acute hypoxic respiratory failure: Due to pneumonia as below. - Resolved with treatments as below  HCAP, lobar pneumonia (left mid and lower lung): CT chest shows mild posterior lower lobe bronchiectasis with associated consolidation. Developed fever of 101F on 9/7. Cultures drawn and initiated IV vancomycin and Zosyn. Pro-calcitonin 1.48 >1.67 . Repeat blood cultures from 9/7: Negative to date. Chest x-ray 9/8 confirmed pneumonia.  - Since cultures continue to be negative, DC'ed  vancomycin. Continues to improve, so will transition zosyn > po levaquin  Essential  hypertension: HCTZ held. Blood pressure is currently soft off antihypertensives.  Gout: No acute flare.  - Continue allopurinol.  Hyperlipidemia: Chronic, stable - Continue statin  Liver mass: CT abdomen showed vague hypodense mass with central calcification in the right hepatic lobe presumably corresponding to the MRI mass from 2011 which was thought to represent hemangioma. CT also showed moderate retroperitoneal somewhat bulky lymphadenopathy and splenomegaly raising concerns for metastatic disease or lymphoproliferative abnormality such as lymphoma. Oncology consulted 9/4 and indicate that the liver lesion is not suspicious for malignancy. As per oncology, based on initial RP lymph node biopsy, lymphoma is suspected but inadequate tissue sample and findings could also be due to lymphoproliferative disorder secondary to MTX or RA. Second biopsy off left Omar node done on 9/7, results pending.  - Dr. Burr Medico is coordinating with pathology regarding results and will follow-up patient later this evening.   Rheumatoid arthritis: Chronic, stable.  - Stopping methotrexate - Will need rheumatology follow up  Urethral/bladder mass:  - As per urology consultation, outpatient cystoscopy.   Acute metabolic encephalopathy: Likely related to acute kidney injury and hypercalcemia (likely the main cause) complicating underlying possible mild dementia. No focal deficits. CT head without acute findings.  - Avoid sedating medications. Encouraged sleep hygiene.  - Consider neurocognitive testing as outpatient  Fall: Sustained a few days ago in her hospital room. No injury sustained.  - As per PT, SNF at time of discharge.  Nutrition: Poor oral intake as per family.  - Dietitian consulted.  - Encouraged oral intake. May be slowly increasing.  DVT prophylaxis: SCDs Code Status: Full Family Communication: Husband at bedside Disposition: SNF likely in next 24 hours  Consultants:  Urology Medical  oncology Interventional radiology   Procedures:  Right percutaneous nephrostomy Retroperitoneal lymph node biopsy 9/6. Left supraclavicular lymph node biopsy 9/7  Antimicrobials:  IV Rocephin-discontinued 9/5  IV vancomycin (discontinued 9/10)  and Zosyn 9/7 > 9/11 Levaquin >>  Subjective: Patient with controlled pain, no dyspnea. Much, much more clear mentally per her husband.   ROS: No fever, N/V/D  Objective: BP (!) 98/54 (BP Location: Right Arm)   Pulse 86   Temp 98.7 F (37.1 C) (Oral)   Resp 20   Ht 4' 10.5" (1.486 m)   Wt 95.5 kg (210 lb 8.6 oz)   SpO2 97%   BMI 43.25 kg/m  Gen: Elderly, pleasant, overweight female in NAD HEENT: MMM, posterior oropharynx clear Pulm: Non-labored; CTAB, no wheezes  CV: Regular rate, no murmur appreciated; distal pulses intact/symmetric. Trace pedal edema GI: + BS; soft, non-tender, non-distended. Right PCN without erythema Skin: As above, otherwise no rashes or wounds. Neuro: A&Ox3, CN II-XII without deficits     CBC:  Recent Labs Lab 08/23/17 0422 08/24/17 0454 08/25/17 0336 08/27/17 0432 08/28/17 0825  WBC 14.3* 14.5* 14.4* 11.2* 9.3  HGB 7.7* 7.7* 8.6* 7.2* 8.5*  HCT 23.8* 23.7* 27.1* 21.8* 25.8*  MCV 103.0* 102.2* 103.0* 103.3* 98.5  PLT 120* 120* 113* 100* 80*   Basic Metabolic Panel:  Recent Labs Lab 08/24/17 0454 08/25/17 0336 08/26/17 0418 08/27/17 0432 08/28/17 0825  NA 139 142 141 135 132*  K 3.2* 3.2* 3.4* 4.2 3.7  CL 105 108 109 105 103  CO2 28 27 27 25 22   GLUCOSE 110* 107* 102* 99 101*  BUN 32* 31* 29* 32* 30*  CREATININE 1.05* 1.01* 1.12* 1.03* 1.01*  CALCIUM 11.6* 10.4*  8.5* 7.8* 7.4*   Liver Function Tests:  Recent Labs Lab 08/24/17 0454 08/25/17 0336 08/26/17 0418 08/27/17 0432 08/28/17 0825  ALBUMIN 1.9* 2.0* 1.7* 1.7* 1.8*   CBG:  Recent Labs Lab 08/22/17 0759 08/23/17 0738 08/23/17 1151 08/27/17 0748  GLUCAP 84 84 96 103*    Recent Results (from the past 240  hour(s))  Blood Culture (routine x 2)     Status: None   Collection Time: 08/20/17  8:10 PM  Result Value Ref Range Status   Specimen Description BLOOD RIGHT HAND  Final   Special Requests IN PEDIATRIC BOTTLE Blood Culture adequate volume  Final   Culture NO GROWTH 5 DAYS  Final   Report Status 08/25/2017 FINAL  Final  Blood Culture (routine x 2)     Status: None   Collection Time: 08/20/17  8:13 PM  Result Value Ref Range Status   Specimen Description BLOOD RIGHT ANTECUBITAL  Final   Special Requests   Final    BOTTLES DRAWN AEROBIC AND ANAEROBIC Blood Culture adequate volume   Culture NO GROWTH 5 DAYS  Final   Report Status 08/25/2017 FINAL  Final  Urine Culture     Status: None   Collection Time: 08/20/17  9:15 PM  Result Value Ref Range Status   Specimen Description URINE, CATHETERIZED  Final   Special Requests NONE  Final   Culture NO GROWTH  Final   Report Status 08/22/2017 FINAL  Final  Urine Culture     Status: None   Collection Time: 08/21/17  2:32 PM  Result Value Ref Range Status   Specimen Description URINE, CATHETERIZED  URINE FROM DRAIN PLACEMENT  Final   Special Requests NONE  Final   Culture NO GROWTH  Final   Report Status 08/22/2017 FINAL  Final  Culture, blood (Routine X 2) w Reflex to ID Panel     Status: None (Preliminary result)   Collection Time: 08/24/17  6:13 PM  Result Value Ref Range Status   Specimen Description BLOOD LEFT HAND  Final   Special Requests   Final    IN PEDIATRIC BOTTLE Blood Culture results may not be optimal due to an excessive volume of blood received in culture bottles   Culture NO GROWTH 4 DAYS  Final   Report Status PENDING  Incomplete  Culture, blood (Routine X 2) w Reflex to ID Panel     Status: None (Preliminary result)   Collection Time: 08/24/17  6:14 PM  Result Value Ref Range Status   Specimen Description BLOOD RIGHT HAND  Final   Special Requests   Final    BOTTLES DRAWN AEROBIC AND ANAEROBIC Blood Culture adequate  volume   Culture NO GROWTH 4 DAYS  Final   Report Status PENDING  Incomplete  Urine Culture     Status: None   Collection Time: 08/24/17 11:01 PM  Result Value Ref Range Status   Specimen Description URINE, CLEAN CATCH  Final   Special Requests NONE  Final   Culture NO GROWTH  Final   Report Status 08/26/2017 FINAL  Final      LOS: 8 days   Vance Gather, MD [facility] Pager [phone]  If 7PM-7AM, please contact night-coverage [email or web address] Password [facility] 08/28/2017, 6:45 PM

## 2017-08-28 NOTE — Clinical Social Work Placement (Signed)
   CLINICAL SOCIAL WORK PLACEMENT  NOTE  Date:  08/28/2017  Patient Details  Name: Kristina Torres MRN: [id number] Date of Birth: 1946-11-20  Clinical Social Work is seeking post-discharge placement for this patient at the [place] level of care (*CSW will initial, date and re-position this form in  chart as items are completed):  Yes   Patient/family provided with [facility] Work Department's list of facilities offering this level of care within the geographic area requested by the patient (or if unable, by the patient's family).  Yes   Patient/family informed of their freedom to choose among providers that offer the needed level of care, that participate in Medicare, Medicaid or managed care program needed by the patient, have an available bed and are willing to accept the patient.  Yes   Patient/family informed of [facility]'s ownership interest in [facility] and [facility], as well as of the fact that they are under no obligation to receive care at these facilities.  PASRR submitted to EDS on 08/26/17     PASRR number received on 08/26/17     Existing PASRR number confirmed on       FL2 transmitted to all facilities in geographic area requested by pt/family on 08/26/17     FL2 transmitted to all facilities within larger geographic area on       Patient informed that his/her managed care company has contracts with or will negotiate with certain facilities, including the following:        Yes   Patient/family informed of bed offers received.  Patient chooses bed at       Physician recommends and patient chooses bed at      Patient to be transferred to   on  .  Patient to be transferred to facility by       Patient family notified on   of transfer.  Name of family member notified:        PHYSICIAN       Additional Comment:    _______________________________________________ Sable Feil, LCSW 08/28/2017, 1:09  PM

## 2017-08-28 NOTE — Clinical Social Work Note (Signed)
Clinical Social Work Assessment  Patient Details  Name: Kristina Torres MRN: [id number] Date of Birth: 06-26-1946  Date of referral:  08/28/17               Reason for consult:  Facility Placement                Permission sought to share information with:  Family Supports Permission granted to share information::  Yes, Verbal Permission Granted  Name::     [name]  Agency::     Relationship::  Husband  Contact Information:  [phone] (h)  Housing/Transportation Living arrangements for the past 2 months:  [place] of Information:  Patient Patient Interpreter Needed:    Criminal Activity/Legal Involvement Pertinent to Current Situation/Hospitalization:  No - Comment as needed Significant Relationships:  Adult Children, Siblings, Spouse Lives with:  Spouse Do you feel safe going back to the place where you live?  No (Patient agreeable to short-term rehab prior to returning home) Need for family participation in patient care:  Yes (Comment)  Care giving concerns: Patient is aware and understanding of the need for rehab before going home. Mrs. Gragert reported  that her husband is currently employed and it is she and her husband in the home.   Social Worker assessment / plan:  CSW visited the room to talk with patient regarding her discharge plan. Mrs. Althouse was sitting up in a chair and was alert, oriented, pleasant and engaged easily with CSW and student intern, as did her sisters. Patient's 3 sister's:  [name], [name], [name] and Vivien Rota [facility]) were at the bedside. Patient also reported that they have a brother [name] who lives in [place]. Mrs. Basques was advised of recommendation of ST rehab and explained the process facility search process as patient has never been to rehab before. Although they live in [place], patient requesting a facility in [place], as her husband works in [place]. Mrs. Ehresman provided with SNF list  and facility responses. Patient informed CSW that she will talk with her husband regarding the facility responses and he may visit some facilities before a decision is made. Mrs. Couse aware that d/c is 9/12.  Employment status:  Retired Forensic scientist:  Information systems manager, Public librarian Supplement) PT Recommendations:  [place] / Referral to community resources:  [place] (Patient provided with SNF list for [place])  Patient/Family's Response to care:  Patient nor sisters expressed any concerns regarding her care during hospitalization.  Patient/Family's Understanding of and Emotional Response to Diagnosis, Current Treatment, and Prognosis:  Patient voiced  an awareness of medical issues and need for rehab to assist in her physical recovery.  Emotional Assessment Appearance:  Appears younger than stated age Attitude/Demeanor/Rapport:  Other (Appropriate) Affect (typically observed):  Accepting, Adaptable, Appropriate, Calm, Pleasant Orientation:  Oriented to Self, Oriented to Place, Oriented to Situation Alcohol / Substance use:  Tobacco Use, Alcohol Use, Illicit Drugs (Patient reports that she quit smoking 2 weeks ago and does not smoke or use illicit) Psych involvement (Current and /or in the community):  No (Comment)  Discharge Needs  Concerns to be addressed:  Discharge Planning Concerns Readmission within the last 30 days:  No Current discharge risk:  None Barriers to Discharge:  No Barriers Identified   Sable Feil, LCSW 08/28/2017, 12:54 PM

## 2017-08-29 DIAGNOSIS — R338 Other retention of urine: Secondary | ICD-10-CM

## 2017-08-29 DIAGNOSIS — E871 Hypo-osmolality and hyponatremia: Secondary | ICD-10-CM

## 2017-08-29 HISTORY — DX: Other disorders of bilirubin metabolism: E80.6

## 2017-08-29 HISTORY — DX: Other disorders of phosphorus metabolism: E83.39

## 2017-08-29 HISTORY — DX: Other retention of urine: R33.8

## 2017-08-29 HISTORY — DX: Hypomagnesemia: E83.42

## 2017-08-29 LAB — CBC
HEMATOCRIT: 25 % — AB (ref 36.0–46.0)
HEMOGLOBIN: 8.3 g/dL — AB (ref 12.0–15.0)
MCH: 32.3 pg (ref 26.0–34.0)
MCHC: 33.2 g/dL (ref 30.0–36.0)
MCV: 97.3 fL (ref 78.0–100.0)
Platelets: 63 10*3/uL — ABNORMAL LOW (ref 150–400)
RBC: 2.57 MIL/uL — ABNORMAL LOW (ref 3.87–5.11)
RDW: 19.4 % — ABNORMAL HIGH (ref 11.5–15.5)
WBC: 5.7 10*3/uL (ref 4.0–10.5)

## 2017-08-29 LAB — CULTURE, BLOOD (ROUTINE X 2)
CULTURE: NO GROWTH
Culture: NO GROWTH
SPECIAL REQUESTS: ADEQUATE

## 2017-08-29 LAB — COMPREHENSIVE METABOLIC PANEL
ALBUMIN: 1.7 g/dL — AB (ref 3.5–5.0)
ALK PHOS: 143 U/L — AB (ref 38–126)
ALT: 33 U/L (ref 14–54)
ANION GAP: 9 (ref 5–15)
AST: 47 U/L — ABNORMAL HIGH (ref 15–41)
BILIRUBIN TOTAL: 2.2 mg/dL — AB (ref 0.3–1.2)
BUN: 24 mg/dL — AB (ref 6–20)
CALCIUM: 7.2 mg/dL — AB (ref 8.9–10.3)
CO2: 19 mmol/L — ABNORMAL LOW (ref 22–32)
Chloride: 103 mmol/L (ref 101–111)
Creatinine, Ser: 0.96 mg/dL (ref 0.44–1.00)
GFR calc Af Amer: >60 mL/min (ref >60)
GFR calc non Af Amer: 58 mL/min — ABNORMAL LOW (ref >60)
GLUCOSE: 83 mg/dL (ref 65–99)
Potassium: 3.7 mmol/L (ref 3.5–5.1)
SODIUM: 131 mmol/L — AB (ref 135–145)
TOTAL PROTEIN: 3.6 g/dL — AB (ref 6.5–8.1)

## 2017-08-29 LAB — PROCALCITONIN: PROCALCITONIN: 2.03 ng/mL

## 2017-08-29 LAB — PHOSPHORUS: Phosphorus: 1.3 mg/dL — ABNORMAL LOW (ref 2.5–4.6)

## 2017-08-29 LAB — MAGNESIUM: Magnesium: 1.4 mg/dL — ABNORMAL LOW (ref 1.7–2.4)

## 2017-08-29 MED ORDER — POTASSIUM PHOSPHATES 15 MMOLE/5ML IV SOLN
30.0000 mmol | Freq: Once | INTRAVENOUS | Status: AC
  Administered 2017-08-29: 30 mmol via INTRAVENOUS
  Filled 2017-08-29: qty 10

## 2017-08-29 MED ORDER — MAGNESIUM SULFATE 2 GM/50ML IV SOLN
2.0000 g | Freq: Once | INTRAVENOUS | Status: AC
  Administered 2017-08-29: 2 g via INTRAVENOUS
  Filled 2017-08-29: qty 50

## 2017-08-29 NOTE — Progress Notes (Signed)
Nutrition Follow-up  DOCUMENTATION CODES:   Morbid obesity  INTERVENTION:   -Smaller, more frequent meals. Snacks ordered between meals   NUTRITION DIAGNOSIS:   Inadequate oral intake related to poor appetite as evidenced by per patient/family report.  Conttnues but being addressed via snacks  GOAL:   Patient will meet greater than or equal to 90% of their needs  Progressing  MONITOR:   PO intake, Supplement acceptance, Weight trends, I & O's  REASON FOR ASSESSMENT:   Consult Assessment of nutrition requirement/status  ASSESSMENT:   Pt with PMH of arthritis, gout, essential HTN, HLD presents with N/V  Pt reports appetite slowly improving. Pt ate most of boiled egg, bites of pancake, bites of bacon and most of V8 Juice at breakfast. Recorded po intake 15% of meals. Pt decline oral nutrition supplements but agreeable to snacks between meals  Pt reports N/V has resolved Metabolic encephalopathy improving, likely related to hypercalemia Pt reports no weight loss PTA  Nutrition-Focused physical exam completed. Findings are WDL for fat depletion, muscle depletion, and edema.   Labs: sodium 131, phosphorus 1.3 (supplemented), magnesium 1.4 (supplemented Meds: reviewed   Diet Order:  Diet regular Room service appropriate? Yes; Fluid consistency: Thin  Skin:  Reviewed, no issues  Last BM:  08/22/17  Height:   Ht Readings from Last 1 Encounters:  08/20/17 4' 10.5" (1.486 m)    Weight:   Wt Readings from Last 1 Encounters:  08/28/17 211 lb 13.8 oz (96.1 kg)    Ideal Body Weight:  44 kg  BMI:  Body mass index is 43.53 kg/m.  Estimated Nutritional Needs:   Kcal:  0947-0962 kcals  Protein:  95-105 g  Fluid:  >/= 2 L/d  EDUCATION NEEDS:   Education needs no appropriate at this time  [place], [place], LDN [phone] Pager  [phone] Weekend/On-Call Pager

## 2017-08-29 NOTE — Progress Notes (Signed)
PROGRESS NOTE    Kristina Torres  FVC:[id number] DOB: [birth year] DOA: 08/20/2017 PCP: [name], MD   Brief Narrative:  Kristina Torres is a 71 y.o. female, PMH of HTN, HLD, gout, RA on MTX and renal mass presented to ED on 08/20/17 with complaints of nausea, nonbloody emesis, RUQ abdominal pain, lightheadedness, generalized weakness and cough. She was seen at [facility] on Tuesday prior to admission and found to have "right renal mass on ultrasound with blockage", started on oral ciprofloxacin for UTI and was supposed to see Urologist on Thursday with a CT scan. In the ED, transiently hypotensive, noted to have acute kidney injury with creatinine of 2.08, hypercalcemia with calcium of 13.87, oxygen saturation 90% on room air and CT abdomen demonstrated right hepatic lobe with hypodense mass, moderate retroperitoneal bulky lymphadenopathy, splenomegaly, severe right hydronephrosis and hydroureter. Urology consulted. IR placed right percutaneous nephrostomy. Oncology consulted and underwent retroperitoneal lymph node biopsy 9/6, preliminary pathology shows malignancy, suspicious for lymphoma but inadequate sample and hence oncology had IR do additional biopsy on 9/7-results showed ? Highgrade Lymphoma. Hypercalcemia resolved. Mental status slowly improving. Patient still feels weak.   Assessment & Plan:   Principal Problem:   Nausea & vomiting Active Problems:   Rheumatoid arthritis ([place])   Essential hypertension   Gout   UTI (urinary tract infection)   Sepsis (HCC)   HLD (hyperlipidemia)   Hypotension   AKI (acute kidney injury) ([place])   Hypercalcemia   Thrombocytopenia (HCC)   Cough   Macrocytic anemia   Lymphadenopathy  Severe right hydronephrosis and hydroureter:  -Urology consulted 9/4 and recommended nephrostomy tube placement by IR which was done same day. Urine cultures negative.  - Discussed with consulting Urologist on 9/7, recommends DC with percutaneous  nephrostomy until outpatient follow-up with Dr. Karsten Ro regarding further evaluation including cystoscopy etc. He indicated that bladder mass could well be lymphoma. -Will discuss with Urology for Inpatient Cystoscopy and possible Biopsy -Follow up with Urology as an outpatient   Acute Urinary Retention -Foley Catheter Placed as Bladder Scan showed >511 mL of Urine -Will need follow up with Urology as an outpatient   Nausea and vomiting:  - Secondary to obstructive uropathy and acute kidney injury. - Resolved.   Hypercalcemia:  -May be secondary to underlying malignancy/lymphoma complicated by use of Tums, HCTZ, calcium and vitamin D supplements at home which have been held.  -Corrected calcium was as high as 14.5 on 9/6.  -Treated with IV normal saline, IV Lasix, a dose of pamidronate 60 mg on 6/5, IM calcitonin 4 doses. Vitamin D, 25-hydroxy: 30.2. PTH related peptide: <2.0  PTH: 16.  - Resolved.  Anemia:  -As per oncology, likely related to methotrexate and anemia of chronic disease.  -Anemia panel: Iron 29, TIBC 214, saturation 14, ferritin 745, folate 9.6, B12: 767.  - As per oncology, transfuse to maintain hgb >74m/dL. -Transfused 1u PRBC 9/10. This seemed to improve mentation considerably. - CBC showed Hb/Hct of 8.3/25.0 - Hold methotrexate per oncology  Thrombocytopenia:  -Worsening. Platelet Count went from 100 -> 80 -> 63 -Stable without bleeding. As per oncology, likely related to methotrexate or autoimmune mediated. -Will need to rule out HIT so will send Ab -Possible Bone Marrow Biopsy -Continue to Monitor and discussed with Oncology and will re-evaluate likely in AM   Hypokalemia:  -Improved -Continue to Monitor and Replete as Necessary -Repeat CMP in AM    Elevated lactate:  -Likely due to dehydration and acute kidney injury. Improved.  AKI:  -SCr 2.08 on admission up from baseline of 0.9 - 1.0 due to obstructive uropathy.  - Resolved with PCN. -  BUN/Cr now 24/0.96  Acute hypoxic respiratory failure:  - Due to pneumonia as below. - Resolved with treatments as below  HCAP, Lobar Pneumonia (left mid and lower lung):  -CT chest shows mild posterior lower lobe bronchiectasis with associated consolidation. Developed fever of 101F on 9/7. Cultures drawn and initiated IV vancomycin and Zosyn.  -Pro-calcitonin 1.48 -> 1.67 -> 2.03 . Repeat blood cultures from 9/7: Negative to date.  -Chest x-ray 9/8 confirmed pneumonia.  - Since cultures continue to be negative, DC'ed vancomycin. Continues to improve, so will transition zosyn > po Levaquin - Afebrile and WBC improved to 5.7  Essential Hypertension:  -HCTZ held. Blood pressure is currently soft off antihypertensives.  Gout:  -No acute flare.  - Continue allopurinol.  Hyperlipidemia: Chronic, stable - Continue statin  Liver mass:  -CT abdomen showed vague hypodense mass with central calcification in the right hepatic lobe presumably corresponding to the MRI mass from 2011 which was thought to represent hemangioma.  -CT also showed moderate retroperitoneal somewhat bulky lymphadenopathy and splenomegaly raising concerns for metastatic disease or lymphoproliferative abnormality such as lymphoma.  -Oncology consulted 9/4 and indicate that the liver lesion is not suspicious for malignancy.  -As per oncology, based on initial RP lymph node biopsy, lymphoma is suspected but inadequate tissue sample and findings could also be due to lymphoproliferative disorder secondary to MTX or RA. Second biopsy off left [place] node done on 9/7, results showed ? Highgrade Lymphoma.  - Dr. Burr Medico is coordinating with pathology regarding results and will follow-up patient after discussion with Pathologist    Rheumatoid arthritis: Chronic, stable.  - Stopping methotrexate - Will need rheumatology follow up  Urethral/bladder mass:  - As per urology consultation, outpatient cystoscopy but will discuss with  them in AM -?Lymphoma. -As Above  Acute metabolic encephalopathy, improved  -Likely related to acute kidney injury and hypercalcemia (likely the main cause) complicating underlying possible mild dementia. No focal deficits. CT head without acute findings.  - Avoid sedating medications. Encouraged sleep hygiene.  - Consider neurocognitive testing as outpatient  Fall:  -Sustained a few days ago in her hospital room. No injury sustained.  - As per PT, SNF at time of discharge.  Nutrition: Poor oral intake as per family.  - Dietitian consulted.  - Encouraged oral intake. May be slowly increasing.  Hypophosphatemia -Patient's Phos Level was 1.3 -Replete with IV KPhos 30 mmol -Continue to Monitor and Replete Mag as Necessary -Repeat Phos Level in AM   Hypomagnesemia -Patient's Mag Level was 1.4 -Replete with IV Mag Sulfate 2 grams -Continue to Monitor and Replete Mag as Necessary -Repeat Mag Level in AM   Hyponatremia -Worsened -Sodium went from 142 -> 131 -Continue to Monitor; May need to start back on IVF in AM -Repeat CMP in AM   Hyperbilirubinemia -T Bili was 2.2 -Continue to Monitor and Repeat LFT's in AM   DVT prophylaxis: SCDs Code Status: FULL CODE Family Communication: Discussed with Sisters at bedside Disposition Plan: SNF likely in AM   Consultants:   Urology  Medical Oncology  Interventional Radiology   Procedures: Right percutaneous nephrostomy Retroperitoneal lymph node biopsy 9/6. Left supraclavicular lymph node biopsy 9/7   Antimicrobials:  Anti-infectives    Start     Dose/Rate Route Frequency Ordered Stop   08/28/17 1815  levofloxacin (LEVAQUIN) tablet 500 mg  500 mg Oral Daily with supper 08/28/17 1804 09/01/17 1659   08/28/17 1700  levofloxacin (LEVAQUIN) 25 MG/ML solution 500 mg  Status:  Discontinued     500 mg Oral Daily with supper 08/28/17 1432 08/28/17 1804   08/25/17 0800  vancomycin (VANCOCIN) IVPB 750 mg/150 ml premix   Status:  Discontinued     750 mg 150 mL/hr over 60 Minutes Intravenous Every 12 hours 08/24/17 1720 08/27/17 1601   08/24/17 1800  vancomycin (VANCOCIN) 2,000 mg in sodium chloride 0.9 % 500 mL IVPB     2,000 mg 250 mL/hr over 120 Minutes Intravenous  Once 08/24/17 1720 08/24/17 2036   08/24/17 1800  piperacillin-tazobactam (ZOSYN) IVPB 3.375 g  Status:  Discontinued     3.375 g 12.5 mL/hr over 240 Minutes Intravenous Every 8 hours 08/24/17 1720 08/28/17 1432   08/21/17 2000  cefTRIAXone (ROCEPHIN) 1 g in dextrose 5 % 50 mL IVPB  Status:  Discontinued     1 g 100 mL/hr over 30 Minutes Intravenous Every 24 hours 08/20/17 1957 08/22/17 1709   08/21/17 1330  ciprofloxacin (CIPRO) IVPB 400 mg     400 mg 200 mL/hr over 60 Minutes Intravenous To Radiology 08/21/17 1319 08/21/17 1430   08/20/17 1945  cefTRIAXone (ROCEPHIN) 2 g in dextrose 5 % 50 mL IVPB     2 g 100 mL/hr over 30 Minutes Intravenous  Once 08/20/17 1932 08/20/17 2242     Subjective: Seen and examined at bedside and felt weak. No nausea or vomiting. Patient's sisters stated she was thinking clearly today. No CP or so SOB. No other concerns or complaints at this time.   Objective: Vitals:   08/28/17 1723 08/28/17 2100 08/29/17 0456 08/29/17 0900  BP: (!) 98/54 (!) 92/45 (!) 90/44 (!) 115/46  Pulse: 86 92 91 84  Resp: _0 Temp: 98.7 F (37.1 C) 98.4 F (36.9 C) 98.1 F (36.7 C) 98.1 F (36.7 C)  TempSrc: Oral Oral Oral Oral  SpO2: 97% 99% 96% 96%  Weight:  96.1 kg (211 lb 13.8 oz)    Height:        Intake/Output Summary (Last 24 hours) at 08/29/17 1809 Last data filed at 08/29/17 1715  Gross per 24 hour  Intake              965 ml  Output              475 ml  Net              490 ml   Filed Weights   08/26/17 2346 08/27/17 2104 08/28/17 2100  Weight: 95.3 kg (210 lb) 95.5 kg (210 lb 8.6 oz) 96.1 kg (211 lb 13.8 oz)   Examination: Physical Exam:  Constitutional: WN/WD obese Caucasian female NAD and  appears calm and comfortable Eyes:  Lids and conjunctivae normal, sclerae anicteric  ENMT: External Ears, Nose appear normal. Grossly normal hearing. Mucous membranes are moist. Neck: Appears normal, supple, no cervical masses, normal ROM, no appreciable thyromegaly, no JVD Respiratory: Clear to auscultation bilaterally, no wheezing, rales, rhonchi or crackles. Normal respiratory effort and patient is not tachypenic. No accessory muscle use.  Cardiovascular: RRR, no murmurs / rubs / gallops. S1 and S2 auscultated. 1+ LE extremity edema.   Abdomen: Soft, non-tender, non-distended. No masses palpated. No appreciable hepatosplenomegaly. Bowel sounds positive x4.  GU: Deferred. Musculoskeletal: No clubbing / cyanosis of digits/nails. No joint deformity upper and lower extremities.  Skin: No rashes,  lesions, ulcers but patient had upper Right arm bruising. No induration; Warm and dry.  Neurologic: CN 2-12 grossly intact with no focal deficits. Strength 5/5 in all 4. Romberg sign cerebellar reflexes not assessed.  Psychiatric: Normal judgment and insight. Alert and oriented x 3. Normal mood and appropriate affect.   Data Reviewed: I have personally reviewed following labs and imaging studies  CBC:  Recent Labs Lab 08/24/17 0454 08/25/17 0336 08/27/17 0432 08/28/17 0825 08/29/17 0433  WBC 14.5* 14.4* 11.2* 9.3 5.7  HGB 7.7* 8.6* 7.2* 8.5* 8.3*  HCT 23.7* 27.1* 21.8* 25.8* 25.0*  MCV 102.2* 103.0* 103.3* 98.5 97.3  PLT 120* 113* 100* 80* 63*   Basic Metabolic Panel:  Recent Labs Lab 08/25/17 0336 08/26/17 0418 08/27/17 0432 08/28/17 0825 08/29/17 0433  NA 142 141 135 132* 131*  K 3.2* 3.4* 4.2 3.7 3.7  CL 108 109 105 103 103  CO2 _0 19*  GLUCOSE 107* 102* 99 101* 83  BUN 31* 29* 32* 30* 24*  CREATININE 1.01* 1.12* 1.03* 1.01* 0.96  CALCIUM 10.4* 8.5* 7.8* 7.4* 7.2*  MG  --   --   --   --  1.4*  PHOS  --   --   --   --  1.3*   GFR: Estimated Creatinine Clearance:  54.1 mL/min (by C-G formula based on SCr of 0.96 mg/dL). Liver Function Tests:  Recent Labs Lab 08/25/17 0336 08/26/17 0418 08/27/17 0432 08/28/17 0825 08/29/17 0433  AST  --   --   --   --  47*  ALT  --   --   --   --  33  ALKPHOS  --   --   --   --  143*  BILITOT  --   --   --   --  2.2*  PROT  --   --   --   --  3.6*  ALBUMIN 2.0* 1.7* 1.7* 1.8* 1.7*   No results for input(s): LIPASE, AMYLASE in the last 168 hours. No results for input(s): AMMONIA in the last 168 hours. Coagulation Profile: No results for input(s): INR, PROTIME in the last 168 hours. Cardiac Enzymes: No results for input(s): CKTOTAL, CKMB, CKMBINDEX, TROPONINI in the last 168 hours. BNP (last 3 results) No results for input(s): PROBNP in the last 8760 hours. HbA1C: No results for input(s): HGBA1C in the last 72 hours. CBG:  Recent Labs Lab 08/23/17 0738 08/23/17 1151 08/27/17 0748  GLUCAP 84 96 103*   Lipid Profile: No results for input(s): CHOL, HDL, LDLCALC, TRIG, CHOLHDL, LDLDIRECT in the last 72 hours. Thyroid Function Tests: No results for input(s): TSH, T4TOTAL, FREET4, T3FREE, THYROIDAB in the last 72 hours. Anemia Panel: No results for input(s): VITAMINB12, FOLATE, FERRITIN, TIBC, IRON, RETICCTPCT in the last 72 hours. Sepsis Labs:  Recent Labs Lab 08/25/17 0743 08/27/17 0432 08/29/17 0433  PROCALCITON 1.48 1.67 2.03    Recent Results (from the past 240 hour(s))  Blood Culture (routine x 2)     Status: None   Collection Time: 08/20/17  8:10 PM  Result Value Ref Range Status   Specimen Description BLOOD RIGHT HAND  Final   Special Requests IN PEDIATRIC BOTTLE Blood Culture adequate volume  Final   Culture NO GROWTH 5 DAYS  Final   Report Status 08/25/2017 FINAL  Final  Blood Culture (routine x 2)     Status: None   Collection Time: 08/20/17  8:13 PM  Result Value Ref Range Status  Specimen Description BLOOD RIGHT ANTECUBITAL  Final   Special Requests   Final    BOTTLES DRAWN  AEROBIC AND ANAEROBIC Blood Culture adequate volume   Culture NO GROWTH 5 DAYS  Final   Report Status 08/25/2017 FINAL  Final  Urine Culture     Status: None   Collection Time: 08/20/17  9:15 PM  Result Value Ref Range Status   Specimen Description URINE, CATHETERIZED  Final   Special Requests NONE  Final   Culture NO GROWTH  Final   Report Status 08/22/2017 FINAL  Final  Urine Culture     Status: None   Collection Time: 08/21/17  2:32 PM  Result Value Ref Range Status   Specimen Description URINE, CATHETERIZED  URINE FROM DRAIN PLACEMENT  Final   Special Requests NONE  Final   Culture NO GROWTH  Final   Report Status 08/22/2017 FINAL  Final  Culture, blood (Routine X 2) w Reflex to ID Panel     Status: None   Collection Time: 08/24/17  6:13 PM  Result Value Ref Range Status   Specimen Description BLOOD LEFT HAND  Final   Special Requests   Final    IN PEDIATRIC BOTTLE Blood Culture results may not be optimal due to an excessive volume of blood received in culture bottles   Culture NO GROWTH 5 DAYS  Final   Report Status 08/29/2017 FINAL  Final  Culture, blood (Routine X 2) w Reflex to ID Panel     Status: None   Collection Time: 08/24/17  6:14 PM  Result Value Ref Range Status   Specimen Description BLOOD RIGHT HAND  Final   Special Requests   Final    BOTTLES DRAWN AEROBIC AND ANAEROBIC Blood Culture adequate volume   Culture NO GROWTH 5 DAYS  Final   Report Status 08/29/2017 FINAL  Final  Urine Culture     Status: None   Collection Time: 08/24/17 11:01 PM  Result Value Ref Range Status   Specimen Description URINE, CLEAN CATCH  Final   Special Requests NONE  Final   Culture NO GROWTH  Final   Report Status 08/26/2017 FINAL  Final    Radiology Studies: No results found.  Scheduled Meds: . allopurinol  300 mg Oral Daily  . dextromethorphan-guaiFENesin  1 tablet Oral BID  . levofloxacin  500 mg Oral Q supper  . pantoprazole  40 mg Oral Daily  . polyethylene glycol  17  g Oral Daily  . senna  1 tablet Oral QHS  . simvastatin  20 mg Oral Daily   Continuous Infusions: . sodium chloride    . potassium PHOSPHATE IVPB (mmol) 30 mmol (08/29/17 1536)    LOS: 9 days   Kerney Elbe, DO [facility] Pager [phone]  If 7PM-7AM, please contact night-coverage [email or web address] Password [facility] 08/29/2017, 6:09 PM

## 2017-08-29 NOTE — Progress Notes (Addendum)
Notified MD Alfredia Ferguson of Mg= 1.4 and that pt and family would like to speak to him regarding care and possible DC.  Pt has a red splotchy rash  on palms of hands. Pt states this rash occurs when she takes Methotrexarte medicine, which she took on Monday. She does not contribute the rash to new medication Levaquin since she states she had the rash before starting it.   Paulla Fore, RN

## 2017-08-30 ENCOUNTER — Inpatient Hospital Stay (HOSPITAL_COMMUNITY): Payer: Medicare Other

## 2017-08-30 DIAGNOSIS — N99522 Malfunction of other external stoma of urinary tract: Secondary | ICD-10-CM

## 2017-08-30 DIAGNOSIS — R590 Localized enlarged lymph nodes: Secondary | ICD-10-CM

## 2017-08-30 DIAGNOSIS — R0600 Dyspnea, unspecified: Secondary | ICD-10-CM

## 2017-08-30 LAB — ECHOCARDIOGRAM COMPLETE
Height: 58.5 in
Weight: 3400.38 oz

## 2017-08-30 LAB — CBC WITH DIFFERENTIAL/PLATELET
BASOS ABS: 0.1 10*3/uL (ref 0.0–0.1)
Basophils Relative: 1 %
Eosinophils Absolute: 0 10*3/uL (ref 0.0–0.7)
Eosinophils Relative: 0 %
HCT: 24.7 % — ABNORMAL LOW (ref 36.0–46.0)
HEMOGLOBIN: 8.2 g/dL — AB (ref 12.0–15.0)
LYMPHS ABS: 1.9 10*3/uL (ref 0.7–4.0)
LYMPHS PCT: 27 %
MCH: 32.3 pg (ref 26.0–34.0)
MCHC: 33.2 g/dL (ref 30.0–36.0)
MCV: 97.2 fL (ref 78.0–100.0)
MONOS PCT: 8 %
Monocytes Absolute: 0.6 10*3/uL (ref 0.1–1.0)
NEUTROS PCT: 64 %
Neutro Abs: 4.5 10*3/uL (ref 1.7–7.7)
Platelets: 76 10*3/uL — ABNORMAL LOW (ref 150–400)
RBC: 2.54 MIL/uL — AB (ref 3.87–5.11)
RDW: 18.8 % — ABNORMAL HIGH (ref 11.5–15.5)
WBC: 7.1 10*3/uL (ref 4.0–10.5)

## 2017-08-30 LAB — MAGNESIUM: Magnesium: 1.7 mg/dL (ref 1.7–2.4)

## 2017-08-30 LAB — COMPREHENSIVE METABOLIC PANEL
ALK PHOS: 173 U/L — AB (ref 38–126)
ALT: 39 U/L (ref 14–54)
ANION GAP: 6 (ref 5–15)
AST: 50 U/L — AB (ref 15–41)
Albumin: 1.7 g/dL — ABNORMAL LOW (ref 3.5–5.0)
BUN: 21 mg/dL — AB (ref 6–20)
CALCIUM: 7.1 mg/dL — AB (ref 8.9–10.3)
CO2: 23 mmol/L (ref 22–32)
Chloride: 100 mmol/L — ABNORMAL LOW (ref 101–111)
Creatinine, Ser: 1.02 mg/dL — ABNORMAL HIGH (ref 0.44–1.00)
GFR calc Af Amer: >60 mL/min (ref >60)
GFR calc non Af Amer: 54 mL/min — ABNORMAL LOW (ref >60)
GLUCOSE: 94 mg/dL (ref 65–99)
Potassium: 3.7 mmol/L (ref 3.5–5.1)
SODIUM: 129 mmol/L — AB (ref 135–145)
Total Bilirubin: 2.3 mg/dL — ABNORMAL HIGH (ref 0.3–1.2)
Total Protein: 3.6 g/dL — ABNORMAL LOW (ref 6.5–8.1)

## 2017-08-30 LAB — RETICULOCYTES
RBC.: 2.45 MIL/uL — ABNORMAL LOW (ref 3.87–5.11)
RETIC COUNT ABSOLUTE: 68.6 10*3/uL (ref 19.0–186.0)
Retic Ct Pct: 2.8 % (ref 0.4–3.1)

## 2017-08-30 LAB — DIC (DISSEMINATED INTRAVASCULAR COAGULATION)PANEL
D-Dimer, Quant: 1.28 ug/mL-FEU — ABNORMAL HIGH (ref 0.00–0.50)
INR: 1.23
Platelets: 74 10*3/uL — ABNORMAL LOW (ref 150–400)
Smear Review: NONE SEEN

## 2017-08-30 LAB — DIC (DISSEMINATED INTRAVASCULAR COAGULATION) PANEL
APTT: 27 s (ref 24–36)
FIBRINOGEN: 246 mg/dL (ref 210–475)
PROTHROMBIN TIME: 15.4 s — AB (ref 11.4–15.2)

## 2017-08-30 LAB — PHOSPHORUS: Phosphorus: 1.6 mg/dL — ABNORMAL LOW (ref 2.5–4.6)

## 2017-08-30 LAB — LACTATE DEHYDROGENASE: LDH: 602 U/L — ABNORMAL HIGH (ref 98–192)

## 2017-08-30 LAB — PATHOLOGIST SMEAR REVIEW

## 2017-08-30 MED ORDER — FUROSEMIDE 10 MG/ML IJ SOLN
20.0000 mg | Freq: Once | INTRAMUSCULAR | Status: AC
  Administered 2017-08-30: 20 mg via INTRAVENOUS
  Filled 2017-08-30: qty 2

## 2017-08-30 MED ORDER — SODIUM CHLORIDE 0.9 % IV SOLN
INTRAVENOUS | Status: DC
  Administered 2017-08-30: 09:00:00 via INTRAVENOUS

## 2017-08-30 MED ORDER — POTASSIUM PHOSPHATES 15 MMOLE/5ML IV SOLN
30.0000 mmol | Freq: Once | INTRAVENOUS | Status: AC
  Administered 2017-08-30: 30 mmol via INTRAVENOUS
  Filled 2017-08-30: qty 10

## 2017-08-30 NOTE — Progress Notes (Signed)
  Echocardiogram 2D Echocardiogram has been performed.  Kristina Torres 08/30/2017, 4:54 PM

## 2017-08-30 NOTE — Progress Notes (Signed)
PROGRESS NOTE    Kristina Torres  OBS:[id number] DOB: Jun 09, 1946 DOA: 08/20/2017 PCP: [name], MD   Brief Narrative:  Kristina Torres is a 71 y.o. female, PMH of HTN, HLD, gout, RA on MTX and renal mass presented to ED on 08/20/17 with complaints of nausea, nonbloody emesis, RUQ abdominal pain, lightheadedness, generalized weakness and cough. She was seen at [facility] on Tuesday prior to admission and found to have "right renal mass on ultrasound with blockage", started on oral ciprofloxacin for UTI and was supposed to see Urologist on Thursday with a CT scan. In the ED, transiently hypotensive, noted to have acute kidney injury with creatinine of 2.08, hypercalcemia with calcium of 13.87, oxygen saturation 90% on room air and CT abdomen demonstrated right hepatic lobe with hypodense mass, moderate retroperitoneal bulky lymphadenopathy, splenomegaly, severe right hydronephrosis and hydroureter. Urology consulted. IR placed right percutaneous nephrostomy. Oncology consulted and underwent retroperitoneal lymph node biopsy 9/6, preliminary pathology shows malignancy, suspicious for lymphoma but inadequate sample and hence oncology had IR do additional biopsy on 9/7-results showed ? Highgrade Lymphoma. Hypercalcemia resolved. Mental status slowly improving. Patient still feels weak.   Patient stated that Nephrostomy tube stopped working so IR was consulted and ordered a Nephrostogram tomorrow. Dr. Burr Medico of Oncology re-evaluated and because cannot definitively call LNBiopsy High grade Lymphoma, FISH panel was ordered and Surgical Biopsy would be necessary for definitive Diagnosis. CT of Neck to evaluate adenopathy ordered by Heme/Onc and Dr. Burr Medico spoke with Dr. Constance Holster who could potentially do biopsy. Patient also to get Bone Marrow Biopsy in AM and Dr. Burr Medico evaluating Thrombocytopenia and ruling out TTP.   Assessment & Plan:   Principal Problem:   Nausea & vomiting Active Problems:  Rheumatoid arthritis ([place])   Essential hypertension   Gout   UTI (urinary tract infection)   Sepsis (HCC)   HLD (hyperlipidemia)   Hypotension   AKI (acute kidney injury) ([place])   Hypercalcemia   Thrombocytopenia (HCC)   Cough   Macrocytic anemia   Lymphadenopathy   Hypophosphatemia   Hypomagnesemia   Hyponatremia   Acute urinary retention   Hyperbilirubinemia  Severe right hydronephrosis and hydroureter:  -Urology consulted 9/4 and recommended nephrostomy tube placement by IR which was done same day. Urine cultures negative.  - Discussed with consulting Urologist on 9/7, recommends DC with percutaneous nephrostomy until outpatient follow-up with Dr. Karsten Ro regarding further evaluation including cystoscopy etc. He indicated that bladder mass could well be lymphoma. -Will discuss with Urology for Inpatient Cystoscopy and possible Biopsy; Discussed with Dr. Alyson Ingles and he states cystoscopy is not possible in the hospital  -Follow up with Urology as an outpatient  -Right PCN stopped draining so IR re-consulted and Patient to undergo a Nephrostogram in AM  Acute Urinary Retention -Foley Catheter Placed as Bladder Scan showed >511 mL of Urine yesterady -Will need follow up with Urology as an outpatient and will need to follow up with Dr. Karsten Ro as an outpatient   Nausea and vomiting:  - Secondary to obstructive uropathy and acute kidney injury. - Resolved.   Hypercalcemia:  -May be secondary to underlying malignancy/lymphoma complicated by use of Tums, HCTZ, calcium and vitamin D supplements at home which have been held.  -Corrected calcium was as high as 14.5 on 9/6.  -Treated with IV normal saline, IV Lasix, a dose of pamidronate 60 mg on 6/5, IM calcitonin 4 doses. Vitamin D, 25-hydroxy: 30.2. PTH related peptide: <2.0  PTH: 16.  - Resolved.  Anemia:  -As  per oncology, likely related to methotrexate and anemia of chronic disease.  -Anemia panel: Iron 29, TIBC 214,  saturation 14, ferritin 745, folate 9.6, B12: 767.  - As per oncology, transfuse to maintain hgb >'8mg'$ /dL. -Transfused 1u PRBC 9/10. This seemed to improve mentation considerably. - CBC showed Hb/Hct of 8.2/24.7 - Hold methotrexate per oncology - Patient to undergo Bone Marrow Biopsy in AM   Thrombocytopenia:  -Worsening. Platelet Count went from 100 -> 80 -> 63 -> 74 -Stable without bleeding. As per oncology, likely related to methotrexate or autoimmune mediated. -Will need to rule out HIT so will send Ab -Bone Marrow Biopsy in AM -DIC Panel being ordered by Hematology  -Continue to Monitor and discussed with Oncology and appreciate Recc's -[id number] ordered to r/o TTP though suspicion is low   Hypokalemia:  -Improved -Continue to Monitor and Replete as Necessary -Repeat CMP in AM    Elevated lactate:  -Likely due to dehydration and acute kidney injury. Improved.  AKI:  -SCr 2.08 on admission up from baseline of 0.9 - 1.0 due to obstructive uropathy.  - Resolved with PCN. - BUN/Cr now 21/1.02  Acute hypoxic respiratory failure:  - Due to pneumonia as below. - Resolved with treatments as below  HCAP, Lobar Pneumonia (left mid and lower lung):  -CT chest shows mild posterior lower lobe bronchiectasis with associated consolidation. Developed fever of 101F on 9/7. Cultures drawn and initiated IV vancomycin and Zosyn.  -Pro-calcitonin 1.48 -> 1.67 -> 2.03 . Repeat blood cultures from 9/7: Negative to date.  -Chest x-ray 9/8 confirmed pneumonia.  - Since cultures continue to be negative, DC'ed vancomycin. Continues to improve, so will transition zosyn > po Levaquin - Afebrile and WBC improved to 5.7 -CXR this AM showed Decreased pulmonary interstitial edema or infiltrates bilaterally may reflect improving CHF or less likely interstitial pneumonia. Persistent atelectasis or scarring at the left base. Thoracic aortic atherosclerosis  Essential Hypertension:  -HCTZ held. Blood  pressure is currently soft off antihypertensives.  Gout:  - No acute flare.  - Continue allopurinol.  Hyperlipidemia:  - Chronic, stable - Continue statin  Liver mass:  -CT abdomen showed vague hypodense mass with central calcification in the right hepatic lobe presumably corresponding to the MRI mass from 2011 which was thought to represent hemangioma.  -CT also showed moderate retroperitoneal somewhat bulky lymphadenopathy and splenomegaly raising concerns for metastatic disease or lymphoproliferative abnormality such as lymphoma.  -Oncology consulted 9/4 and indicate that the liver lesion is not suspicious for malignancy.  -As per oncology, based on initial RP lymph node biopsy, lymphoma is suspected but inadequate tissue sample and findings could also be due to lymphoproliferative disorder secondary to MTX or RA. Second biopsy off left Thornton node done on 9/7, results showed ? Highgrade Lymphoma.  - Dr. Burr Medico is coordinating with pathology regarding results and will follow-up patient after discussion with Pathologist    Rheumatoid arthritis: Chronic, stable.  - Stopping methotrexate - Will need rheumatology follow up  Urethral/bladder mass:  - As per urology consultation, outpatient cystoscopy but will discuss with them in AM -?Lymphoma. -As Above  Acute metabolic encephalopathy, improved  -Likely related to acute kidney injury and hypercalcemia (likely the main cause) complicating underlying possible mild dementia. No focal deficits. CT head without acute findings.  - Avoid sedating medications. Encouraged sleep hygiene.  - Consider neurocognitive testing as outpatient  Fall:  -Sustained a few days ago in her hospital room. No injury sustained.  - As per PT, SNF  at time of discharge.  Nutrition: Poor oral intake as per family.  - Dietitian consulted.  - Encouraged oral intake. May be slowly increasing.  Hypophosphatemia -Patient's Phos Level was 1.6 -Replete with IV  KPhos 30 mmol -Continue to Monitor and Replete Mag as Necessary -Repeat Phos Level in AM   Hypomagnesemia -Patient's Mag Level was 1.7 -Replete with IV Mag Sulfate 2 grams yesterday -Continue to Monitor and Replete Mag as Necessary -Repeat Mag Level in AM   Hyponatremia -Worsened -Sodium went from 142 -> 131 -> 129 -Suspect Hypervolemia Hyponatremia; Will try Lasix in AM -Continue to Monitor;  -Repeat CMP in AM   Hyperbilirubinemia -T Bili was 2.3 -DIC Panel being ordered by Oncology -Continue to Monitor and Repeat LFT's in AM   Bilateral LE Edema suspect Acute Diastolic CHF Exacerbation -ECHO ordered and showed EF of 60-65% with G1DD -Ordered Lasix -Strict I's/O's; Patient is +1.314 Liters -Daily Weights; Patient is +37 lbs -Fluid Restrict -Will Consult with Cardiology in AM   Adenopathy -suspicious for Lymphoma -CT Neck ordered by Oncology  DVT prophylaxis: SCDs Code Status: FULL CODE Family Communication: Discussed with Sisters at bedside Disposition Plan: SNF likely in AM   Consultants:   Urology  Medical Oncology  Interventional Radiology   Procedures: Right percutaneous nephrostomy Retroperitoneal lymph node biopsy 9/6. Left supraclavicular lymph node biopsy 9/7   Antimicrobials:  Anti-infectives    Start     Dose/Rate Route Frequency Ordered Stop   08/28/17 1815  levofloxacin (LEVAQUIN) tablet 500 mg     500 mg Oral Daily with supper 08/28/17 1804 09/01/17 1659   08/28/17 1700  levofloxacin (LEVAQUIN) 25 MG/ML solution 500 mg  Status:  Discontinued     500 mg Oral Daily with supper 08/28/17 1432 08/28/17 1804   08/25/17 0800  vancomycin (VANCOCIN) IVPB 750 mg/150 ml premix  Status:  Discontinued     750 mg 150 mL/hr over 60 Minutes Intravenous Every 12 hours 08/24/17 1720 08/27/17 1601   08/24/17 1800  vancomycin (VANCOCIN) 2,000 mg in sodium chloride 0.9 % 500 mL IVPB     2,000 mg 250 mL/hr over 120 Minutes Intravenous  Once 08/24/17 1720  08/24/17 2036   08/24/17 1800  piperacillin-tazobactam (ZOSYN) IVPB 3.375 g  Status:  Discontinued     3.375 g 12.5 mL/hr over 240 Minutes Intravenous Every 8 hours 08/24/17 1720 08/28/17 1432   08/21/17 2000  cefTRIAXone (ROCEPHIN) 1 g in dextrose 5 % 50 mL IVPB  Status:  Discontinued     1 g 100 mL/hr over 30 Minutes Intravenous Every 24 hours 08/20/17 1957 08/22/17 1709   08/21/17 1330  ciprofloxacin (CIPRO) IVPB 400 mg     400 mg 200 mL/hr over 60 Minutes Intravenous To Radiology 08/21/17 1319 08/21/17 1430   08/20/17 1945  cefTRIAXone (ROCEPHIN) 2 g in dextrose 5 % 50 mL IVPB     2 g 100 mL/hr over 30 Minutes Intravenous  Once 08/20/17 1932 08/20/17 2242     Subjective: Seen and examined at bedside and mentation was improved but was still weak. Had more swelling in legs. Had a little SOB this AM   Objective: Vitals:   08/29/17 2051 08/30/17 0622 08/30/17 0846 08/30/17 1825  BP: (!) 92/41 (!) 96/51 (!) 100/49 96/74  Pulse: 88 82 78 79  Resp: '18 18 19 18  '$ Temp: 98.5 F (36.9 C) 98.4 F (36.9 C) 97.8 F (36.6 C) 98.2 F (36.8 C)  TempSrc: Oral Oral Oral Oral  SpO2: 96%  98% 96% 97%  Weight: 96.4 kg (212 lb 8.4 oz)     Height:        Intake/Output Summary (Last 24 hours) at 08/30/17 2039 Last data filed at 08/30/17 1350  Gross per 24 hour  Intake              365 ml  Output             1300 ml  Net             -935 ml   Filed Weights   08/27/17 2104 08/28/17 2100 08/29/17 2051  Weight: 95.5 kg (210 lb 8.6 oz) 96.1 kg (211 lb 13.8 oz) 96.4 kg (212 lb 8.4 oz)   Examination: Physical Exam:  Constitutional: Pleasant, WN/WD obse Caucasian female in NAD Eyes: Sclerae anicteric; Conjuctivae non-injected ENMT: Grossly normal hearing. Mucous membranes appear moist Neck: Supple with no visible JVD Respiratory: Diminished but no appreciable wheezing/rales/rhonchi. Patient not tachypenic or using any accessory muscles to breathe Cardiovascular: RRR; 2+ LE Bilateral  Edema Abdomen: Soft, NT, Distended due to body habitus.  GU: Deferred; Has foley catheter in place and Right PCN (not draining as much) Musculoskeletal: No contractures; No Cyanosis Skin: Warn and Dry. No ulcers appreciated on limited skin eval Neurologic: CN 2-12 grossly intact. No appreciable focal deficits Psychiatric: Normal mood and affect. Intact judgement and insight  Data Reviewed: I have personally reviewed following labs and imaging studies  CBC:  Recent Labs Lab 08/25/17 0336 08/27/17 0432 08/28/17 0825 08/29/17 0433 08/30/17 0316 08/30/17 1447  WBC 14.4* 11.2* 9.3 5.7 7.1  --   NEUTROABS  --   --   --   --  4.5  --   HGB 8.6* 7.2* 8.5* 8.3* 8.2*  --   HCT 27.1* 21.8* 25.8* 25.0* 24.7*  --   MCV 103.0* 103.3* 98.5 97.3 97.2  --   PLT 113* 100* 80* 63* 76* 74*   Basic Metabolic Panel:  Recent Labs Lab 08/26/17 0418 08/27/17 0432 08/28/17 0825 08/29/17 0433 08/30/17 0316  NA 141 135 132* 131* 129*  K 3.4* 4.2 3.7 3.7 3.7  CL 109 105 103 103 100*  CO2 '27 25 22 '$ 19* 23  GLUCOSE 102* 99 101* 83 94  BUN 29* 32* 30* 24* 21*  CREATININE 1.12* 1.03* 1.01* 0.96 1.02*  CALCIUM 8.5* 7.8* 7.4* 7.2* 7.1*  MG  --   --   --  1.4* 1.7  PHOS  --   --   --  1.3* 1.6*   GFR: Estimated Creatinine Clearance: 51 mL/min (A) (by C-G formula based on SCr of 1.02 mg/dL (H)). Liver Function Tests:  Recent Labs Lab 08/26/17 0418 08/27/17 0432 08/28/17 0825 08/29/17 0433 08/30/17 0316  AST  --   --   --  47* 50*  ALT  --   --   --  33 39  ALKPHOS  --   --   --  143* 173*  BILITOT  --   --   --  2.2* 2.3*  PROT  --   --   --  3.6* 3.6*  ALBUMIN 1.7* 1.7* 1.8* 1.7* 1.7*   No results for input(s): LIPASE, AMYLASE in the last 168 hours. No results for input(s): AMMONIA in the last 168 hours. Coagulation Profile:  Recent Labs Lab 08/30/17 1447  INR 1.23   Cardiac Enzymes: No results for input(s): CKTOTAL, CKMB, CKMBINDEX, TROPONINI in the last 168 hours. BNP (last 3  results) No results for  input(s): PROBNP in the last 8760 hours. HbA1C: No results for input(s): HGBA1C in the last 72 hours. CBG:  Recent Labs Lab 08/27/17 0748  GLUCAP 103*   Lipid Profile: No results for input(s): CHOL, HDL, LDLCALC, TRIG, CHOLHDL, LDLDIRECT in the last 72 hours. Thyroid Function Tests: No results for input(s): TSH, T4TOTAL, FREET4, T3FREE, THYROIDAB in the last 72 hours. Anemia Panel:  Recent Labs  08/30/17 1447  RETICCTPCT 2.8   Sepsis Labs:  Recent Labs Lab 08/25/17 0743 08/27/17 0432 08/29/17 0433  PROCALCITON 1.48 1.67 2.03    Recent Results (from the past 240 hour(s))  Urine Culture     Status: None   Collection Time: 08/20/17  9:15 PM  Result Value Ref Range Status   Specimen Description URINE, CATHETERIZED  Final   Special Requests NONE  Final   Culture NO GROWTH  Final   Report Status 08/22/2017 FINAL  Final  Urine Culture     Status: None   Collection Time: 08/21/17  2:32 PM  Result Value Ref Range Status   Specimen Description URINE, CATHETERIZED  URINE FROM DRAIN PLACEMENT  Final   Special Requests NONE  Final   Culture NO GROWTH  Final   Report Status 08/22/2017 FINAL  Final  Culture, blood (Routine X 2) w Reflex to ID Panel     Status: None   Collection Time: 08/24/17  6:13 PM  Result Value Ref Range Status   Specimen Description BLOOD LEFT HAND  Final   Special Requests   Final    IN PEDIATRIC BOTTLE Blood Culture results may not be optimal due to an excessive volume of blood received in culture bottles   Culture NO GROWTH 5 DAYS  Final   Report Status 08/29/2017 FINAL  Final  Culture, blood (Routine X 2) w Reflex to ID Panel     Status: None   Collection Time: 08/24/17  6:14 PM  Result Value Ref Range Status   Specimen Description BLOOD RIGHT HAND  Final   Special Requests   Final    BOTTLES DRAWN AEROBIC AND ANAEROBIC Blood Culture adequate volume   Culture NO GROWTH 5 DAYS  Final   Report Status 08/29/2017 FINAL   Final  Urine Culture     Status: None   Collection Time: 08/24/17 11:01 PM  Result Value Ref Range Status   Specimen Description URINE, CLEAN CATCH  Final   Special Requests NONE  Final   Culture NO GROWTH  Final   Report Status 08/26/2017 FINAL  Final    Radiology Studies: Dg Chest 1 View  Result Date: 08/30/2017 CLINICAL DATA:  Shortness of breath, hypertension, hyperlipidemia, rheumatoid arthritis. EXAM: CHEST 1 VIEW COMPARISON:  Chest x-ray of August 25, 2017 FINDINGS: The lungs are adequately inflated. Interval improvement in the pulmonary interstitium has occurred. There remains small amount of fluid in the minor fissure on the right. Linear increased density at the left lung base persists. The heart is normal in size. The pulmonary vascularity is less prominent centrally. There is calcification in the wall of the aortic arch. There is multilevel degenerative disc disease of the thoracic spine. IMPRESSION: Decreased pulmonary interstitial edema or infiltrates bilaterally may reflect improving CHF or less likely interstitial pneumonia. Persistent atelectasis or scarring at the left base. Thoracic aortic atherosclerosis. Electronically Signed   By: David  Martinique M.D.   On: 08/30/2017 08:01    Scheduled Meds: . allopurinol  300 mg Oral Daily  . dextromethorphan-guaiFENesin  1 tablet Oral BID  .  levofloxacin  500 mg Oral Q supper  . pantoprazole  40 mg Oral Daily  . polyethylene glycol  17 g Oral Daily  . senna  1 tablet Oral QHS  . simvastatin  20 mg Oral Daily   Continuous Infusions: . sodium chloride      LOS: 10 days   Kerney Elbe, DO [facility] Pager [phone]  If 7PM-7AM, please contact night-coverage [email or web address] Password [facility] 08/30/2017, 8:39 PM

## 2017-08-30 NOTE — Progress Notes (Signed)
Physical Therapy Treatment Patient Details Name: Naveyah Iacovelli MRN: [id number] DOB: 1946-02-08 Today's Date: 08/30/2017    History of Present Illness 71 year old married female, PMH of HTN, HLD, gout, rheumatoid arthritis and renal mass presented to ED on 08/20/17 with complaints of nausea, nonbloody emesis, RUQ abdominal pain, lightheadedness, generalized weakness and cough.  Found to have UTI with renal blockage due to mass.  Urology consulted. IR placed right percutaneous nephrostomy.    PT Comments    Pt will be up to walk hopefully tomorrow but today declined the effort.  Her plan for rehab is to continue on acutely for mobility and transition to SNF when MD has offered her to go.  She is very positive about expectations for improvement and will likely progress with a good effort.   Follow Up Recommendations  SNF     Equipment Recommendations  Rolling walker with 5" wheels    Recommendations for Other Services       Precautions / Restrictions Precautions Precautions: Fall Restrictions Weight Bearing Restrictions: No    Mobility  Bed Mobility               General bed mobility comments: declined OOB  Transfers                    Ambulation/Gait                 Stairs            Wheelchair Mobility    Modified Rankin (Stroke Patients Only)       Balance                                            Cognition Arousal/Alertness: Awake/alert Behavior During Therapy: WFL for tasks assessed/performed Overall Cognitive Status: Impaired/Different from baseline Area of Impairment: Following commands;Safety/judgement;Awareness;Problem solving                 Orientation Level: Situation Current Attention Level: Sustained   Following Commands: Follows one step commands with increased time Safety/Judgement: Decreased awareness of deficits;Decreased awareness of safety Awareness: Intellectual Problem Solving: Slow  processing;Decreased initiation;Difficulty sequencing;Requires verbal cues        Exercises General Exercises - Lower Extremity Ankle Circles/Pumps: AROM;Both;10 reps Quad Sets: AROM;Both;10 reps Gluteal Sets: AROM;Both;10 reps Heel Slides: AROM;Both;10 reps Hip ABduction/ADduction: AROM;Both;10 reps Hip Flexion/Marching: AROM;Both;10 reps    General Comments        Pertinent Vitals/Pain Pain Assessment: No/denies pain    Home Living                      Prior Function            PT Goals (current goals can now be found in the care plan section) Acute Rehab PT Goals Patient Stated Goal: To return to independent Progress towards PT goals: Progressing toward goals    Frequency    Min 3X/week      PT Plan Current plan remains appropriate    Co-evaluation              AM-PAC PT "6 Clicks" Daily Activity  Outcome Measure  Difficulty turning over in bed (including adjusting bedclothes, sheets and blankets)?: A Lot Difficulty moving from lying on back to sitting on the side of the bed? : A Lot Difficulty sitting down on and standing up from a  chair with arms (e.g., wheelchair, bedside commode, etc,.)?: A Lot Help needed moving to and from a bed to chair (including a wheelchair)?: A Lot Help needed walking in hospital room?: A Lot Help needed climbing 3-5 steps with a railing? : Total 6 Click Score: 11    End of Session   Activity Tolerance: Patient tolerated treatment well Patient left: in bed;with call bell/phone within reach;with bed alarm set;with family/visitor present Nurse Communication: Mobility status PT Visit Diagnosis: Other abnormalities of gait and mobility (R26.89);Unsteadiness on feet (R26.81);Muscle weakness (generalized) (M62.81);Pain     Time: 6834-1962 PT Time Calculation (min) (ACUTE ONLY): 20 min  Charges:  $Therapeutic Exercise: 8-22 mins                    G Codes:  Functional Assessment Tool Used: AM-PAC 6 Clicks Basic  Mobility     Ramond Dial 08/30/2017, 5:22 PM   Mee Hives, PT MS Acute Rehab Dept. Number: [place] [facility] [place]

## 2017-08-30 NOTE — Progress Notes (Signed)
IR aware of request for PCN evaluation.  We will plan for nephrostogram tomorrow.  Carrine Kroboth E 3:57 PM

## 2017-08-30 NOTE — Progress Notes (Addendum)
Kristina Torres   DOB:[birth year]   JX#:[id number]   OZH#:[id number]  Hematology and Oncology service follow up note   Subjective: Pt's mental status has improved, laert and oriented. She has developed some dyspnea, echo was done today. She feels very fatigued overall.   Objective:  Vitals:   08/30/17 0846 08/30/17 1825  BP: (!) 100/49 96/74  Pulse: 78 79  Resp: 19 18  Temp: 97.8 F (36.6 C) 98.2 F (36.8 C)  SpO2: 96% 97%    Body mass index is 43.66 kg/m.  Intake/Output Summary (Last 24 hours) at 08/30/17 1907 Last data filed at 08/30/17 1350  Gross per 24 hour  Intake              365 ml  Output             1300 ml  Net             -935 ml     Sclerae unicteric  Oropharynx clear  No peripheral adenopathy In her neck, supraclavicular, axilla or groin area  Lungs clear -- no rales or rhonchi  Heart regular rate and rhythm  Abdomen benign  MSK no focal spinal tenderness, no peripheral edema  Neuro nonfocal, somnolent    CBG (last 3)  No results for input(s): GLUCAP in the last 72 hours.   Labs:  Urine Studies No results for input(s): UHGB, CRYS in the last 72 hours.  Invalid input(s): UACOL, UAPR, USPG, UPH, UTP, UGL, UKET, UBIL, UNIT, UROB, [place], UEPI, UWBC, Duwayne Heck [place], [place]  Basic Metabolic Panel:  Recent Labs Lab 08/26/17 0418 08/27/17 0432 08/28/17 0825 08/29/17 0433 08/30/17 0316  NA 141 135 132* 131* 129*  K 3.4* 4.2 3.7 3.7 3.7  CL 109 105 103 103 100*  CO2 _0 19* 23  GLUCOSE 102* 99 101* 83 94  BUN 29* 32* 30* 24* 21*  CREATININE 1.12* 1.03* 1.01* 0.96 1.02*  CALCIUM 8.5* 7.8* 7.4* 7.2* 7.1*  MG  --   --   --  1.4* 1.7  PHOS  --   --   --  1.3* 1.6*   GFR Estimated Creatinine Clearance: 51 mL/min (A) (by C-G formula based on SCr of 1.02 mg/dL (H)). Liver Function Tests:  Recent Labs Lab 08/26/17 0418 08/27/17 0432 08/28/17 0825 08/29/17 0433 08/30/17 0316  AST  --   --   --  47* 50*  ALT  --   --   --  33 39   ALKPHOS  --   --   --  143* 173*  BILITOT  --   --   --  2.2* 2.3*  PROT  --   --   --  3.6* 3.6*  ALBUMIN 1.7* 1.7* 1.8* 1.7* 1.7*   No results for input(s): LIPASE, AMYLASE in the last 168 hours. No results for input(s): AMMONIA in the last 168 hours. Coagulation profile  Recent Labs Lab 08/30/17 1447  INR 1.23    CBC:  Recent Labs Lab 08/25/17 0336 08/27/17 0432 08/28/17 0825 08/29/17 0433 08/30/17 0316 08/30/17 1447  WBC 14.4* 11.2* 9.3 5.7 7.1  --   NEUTROABS  --   --   --   --  4.5  --   HGB 8.6* 7.2* 8.5* 8.3* 8.2*  --   HCT 27.1* 21.8* 25.8* 25.0* 24.7*  --   MCV 103.0* 103.3* 98.5 97.3 97.2  --   PLT 113* 100* 80* 63* 76* 74*   Cardiac Enzymes: No  results for input(s): CKTOTAL, CKMB, CKMBINDEX, TROPONINI in the last 168 hours. BNP: Invalid input(s): POCBNP CBG:  Recent Labs Lab 08/27/17 0748  GLUCAP 103*   D-Dimer  Recent Labs  08/30/17 1447  DDIMER 1.28*   Hgb A1c No results for input(s): HGBA1C in the last 72 hours. Lipid Profile No results for input(s): CHOL, HDL, LDLCALC, TRIG, CHOLHDL, LDLDIRECT in the last 72 hours. Thyroid function studies No results for input(s): TSH, T4TOTAL, T3FREE, THYROIDAB in the last 72 hours.  Invalid input(s): FREET3 Anemia work up  Recent Labs  08/30/17 1447  RETICCTPCT 2.8   Microbiology Recent Results (from the past 240 hour(s))  Blood Culture (routine x 2)     Status: None   Collection Time: 08/20/17  8:10 PM  Result Value Ref Range Status   Specimen Description BLOOD RIGHT HAND  Final   Special Requests IN PEDIATRIC BOTTLE Blood Culture adequate volume  Final   Culture NO GROWTH 5 DAYS  Final   Report Status 08/25/2017 FINAL  Final  Blood Culture (routine x 2)     Status: None   Collection Time: 08/20/17  8:13 PM  Result Value Ref Range Status   Specimen Description BLOOD RIGHT ANTECUBITAL  Final   Special Requests   Final    BOTTLES DRAWN AEROBIC AND ANAEROBIC Blood Culture adequate volume    Culture NO GROWTH 5 DAYS  Final   Report Status 08/25/2017 FINAL  Final  Urine Culture     Status: None   Collection Time: 08/20/17  9:15 PM  Result Value Ref Range Status   Specimen Description URINE, CATHETERIZED  Final   Special Requests NONE  Final   Culture NO GROWTH  Final   Report Status 08/22/2017 FINAL  Final  Urine Culture     Status: None   Collection Time: 08/21/17  2:32 PM  Result Value Ref Range Status   Specimen Description URINE, CATHETERIZED  URINE FROM DRAIN PLACEMENT  Final   Special Requests NONE  Final   Culture NO GROWTH  Final   Report Status 08/22/2017 FINAL  Final  Culture, blood (Routine X 2) w Reflex to ID Panel     Status: None   Collection Time: 08/24/17  6:13 PM  Result Value Ref Range Status   Specimen Description BLOOD LEFT HAND  Final   Special Requests   Final    IN PEDIATRIC BOTTLE Blood Culture results may not be optimal due to an excessive volume of blood received in culture bottles   Culture NO GROWTH 5 DAYS  Final   Report Status 08/29/2017 FINAL  Final  Culture, blood (Routine X 2) w Reflex to ID Panel     Status: None   Collection Time: 08/24/17  6:14 PM  Result Value Ref Range Status   Specimen Description BLOOD RIGHT HAND  Final   Special Requests   Final    BOTTLES DRAWN AEROBIC AND ANAEROBIC Blood Culture adequate volume   Culture NO GROWTH 5 DAYS  Final   Report Status 08/29/2017 FINAL  Final  Urine Culture     Status: None   Collection Time: 08/24/17 11:01 PM  Result Value Ref Range Status   Specimen Description URINE, CLEAN CATCH  Final   Special Requests NONE  Final   Culture NO GROWTH  Final   Report Status 08/26/2017 FINAL  Final      Studies:  Dg Chest 1 View  Result Date: 08/30/2017 CLINICAL DATA:  Shortness of breath, hypertension, hyperlipidemia, rheumatoid  arthritis. EXAM: CHEST 1 VIEW COMPARISON:  Chest x-ray of August 25, 2017 FINDINGS: The lungs are adequately inflated. Interval improvement in the pulmonary  interstitium has occurred. There remains small amount of fluid in the minor fissure on the right. Linear increased density at the left lung base persists. The heart is normal in size. The pulmonary vascularity is less prominent centrally. There is calcification in the wall of the aortic arch. There is multilevel degenerative disc disease of the thoracic spine. IMPRESSION: Decreased pulmonary interstitial edema or infiltrates bilaterally may reflect improving CHF or less likely interstitial pneumonia. Persistent atelectasis or scarring at the left base. Thoracic aortic atherosclerosis. Electronically Signed   By: David  Martinique M.D.   On: 08/30/2017 08:01    Assessment: Jazman Reuter is a 71 y.o. female with a history of HTN, gout, HLD and Rheumatoid Arthritis on MTX, presented with severe right hydronephrosis, fatigue, nausea,.and weakness    1. PR and [place] adenopathy , suspicious for high grade lymphoma  2. Macrocytic anemia, likely secondary to MTX and anemia of chronic disease, worsening  3. Mild thrombocytopenia, possibly related to MTX, or autoimmune mediated, worsening  4. RA 5. HTN  6. Right hydronephrosis secondary to ureteral obstruction, status post nephrostomy tube placement 7. Severe hypercalcemia, resolved  8. Nausea and vomiting, anorexia  9. Metabolic encephalopathy, likely secondary to hypercalcemia, improved 10. HCAP, on antibiotics  11. Pulmonary edema   Plan:  -I have spoken with pathologist Dr. Gari Crown today, he suspect pt's lymph node biopsy could be high grade lymphoma, but cannot definitively call it, due to the limited material. Methotrexate-induced lymphoproliferative disorder is on the differential. Dr. Gari Crown will order lymphoma FISH panel for further investigation, hopefully the results will be back in 3-4 business days. He also strongly recommend a surgical biopsy to get the whole lymph node, for more accurate diagnosis. -I have spoken with general surgeon Dr. Brantley Stage and  ENT Dr. Constance Holster about surgical biopsy of his liver to biopsy. We will get a CT neck to see if she has any cervical adenopathy, if not, the least invasive surgery would be the left supraclavicular lymph node biopsy. Dr. Constance Holster is willing to offer, possible next Monday. -I recommend a bone marrow biopsy tomorrow, to see if her marrow has any component of lymphoma, since bone marrow biopsies less invasive, and he may needed needed if lymphoma is diagnosed. -Patient also has elevated indirect bilirubin, LDH, suspicious mild hemolysis. However her reticular count is not elevated. I have ordered haptoglobin. Due to her worsening thrombocytopenia, I have ordered ADAMTS13 to rule out TTP, although my suspicion is low. I have reviewed her peripheral smear today, showed no schistocytes.  -NPO after midnight, I will call IR in the morning to see if they can do bone marrow biopsy.  -I will order CT neck  -I spoke with pt, her husband and sister, and they all agree with the plan  -will f/u    Truitt Merle, MD 08/30/2017  7:07 PM

## 2017-08-31 ENCOUNTER — Inpatient Hospital Stay (HOSPITAL_COMMUNITY): Payer: Medicare Other

## 2017-08-31 ENCOUNTER — Encounter (HOSPITAL_COMMUNITY): Payer: Self-pay | Admitting: General Surgery

## 2017-08-31 DIAGNOSIS — E46 Unspecified protein-calorie malnutrition: Secondary | ICD-10-CM

## 2017-08-31 DIAGNOSIS — I5041 Acute combined systolic (congestive) and diastolic (congestive) heart failure: Secondary | ICD-10-CM

## 2017-08-31 DIAGNOSIS — R600 Localized edema: Secondary | ICD-10-CM

## 2017-08-31 DIAGNOSIS — C8590 Non-Hodgkin lymphoma, unspecified, unspecified site: Secondary | ICD-10-CM

## 2017-08-31 DIAGNOSIS — R338 Other retention of urine: Secondary | ICD-10-CM

## 2017-08-31 HISTORY — PX: BONE MARROW BIOPSY: SHX199

## 2017-08-31 HISTORY — PX: IR NEPHROSTOMY EXCHANGE RIGHT: IMG6070

## 2017-08-31 LAB — COMPREHENSIVE METABOLIC PANEL
ALBUMIN: 1.6 g/dL — AB (ref 3.5–5.0)
ALK PHOS: 159 U/L — AB (ref 38–126)
ALT: 39 U/L (ref 14–54)
AST: 55 U/L — AB (ref 15–41)
Anion gap: 8 (ref 5–15)
BILIRUBIN TOTAL: 2.4 mg/dL — AB (ref 0.3–1.2)
BUN: 15 mg/dL (ref 6–20)
CALCIUM: 6.9 mg/dL — AB (ref 8.9–10.3)
CO2: 21 mmol/L — ABNORMAL LOW (ref 22–32)
Chloride: 101 mmol/L (ref 101–111)
Creatinine, Ser: 1.11 mg/dL — ABNORMAL HIGH (ref 0.44–1.00)
GFR calc Af Amer: 57 mL/min — ABNORMAL LOW (ref >60)
GFR, EST NON AFRICAN AMERICAN: 49 mL/min — AB (ref >60)
GLUCOSE: 98 mg/dL (ref 65–99)
Potassium: 4.2 mmol/L (ref 3.5–5.1)
Sodium: 130 mmol/L — ABNORMAL LOW (ref 135–145)
TOTAL PROTEIN: 3.4 g/dL — AB (ref 6.5–8.1)

## 2017-08-31 LAB — MAGNESIUM: Magnesium: 1.5 mg/dL — ABNORMAL LOW (ref 1.7–2.4)

## 2017-08-31 LAB — CBC WITH DIFFERENTIAL/PLATELET
BASOS ABS: 0.3 10*3/uL — AB (ref 0.0–0.1)
BASOS PCT: 2 %
BLASTS: 0 %
Band Neutrophils: 0 %
EOS PCT: 0 %
Eosinophils Absolute: 0 10*3/uL (ref 0.0–0.7)
HEMATOCRIT: 25.6 % — AB (ref 36.0–46.0)
Hemoglobin: 8.6 g/dL — ABNORMAL LOW (ref 12.0–15.0)
LYMPHS ABS: 2.9 10*3/uL (ref 0.7–4.0)
Lymphocytes Relative: 22 %
MCH: 33 pg (ref 26.0–34.0)
MCHC: 33.6 g/dL (ref 30.0–36.0)
MCV: 98.1 fL (ref 78.0–100.0)
METAMYELOCYTES PCT: 0 %
Monocytes Absolute: 0.4 10*3/uL (ref 0.1–1.0)
Monocytes Relative: 3 %
Myelocytes: 0 %
Neutro Abs: 9.2 10*3/uL — ABNORMAL HIGH (ref 1.7–7.7)
Neutrophils Relative %: 70 %
OTHER: 3 %
PLATELETS: 69 10*3/uL — AB (ref 150–400)
Promyelocytes Absolute: 0 %
RBC: 2.61 MIL/uL — ABNORMAL LOW (ref 3.87–5.11)
RDW: 18.6 % — AB (ref 11.5–15.5)
WBC: 13.2 10*3/uL — ABNORMAL HIGH (ref 4.0–10.5)
nRBC: 0 /100 WBC

## 2017-08-31 LAB — BRAIN NATRIURETIC PEPTIDE: B NATRIURETIC PEPTIDE 5: 21.1 pg/mL (ref 0.0–100.0)

## 2017-08-31 LAB — HAPTOGLOBIN: Haptoglobin: 109 mg/dL (ref 34–200)

## 2017-08-31 LAB — ADAMTS13 ACTIVITY: Adamts 13 Activity: 44.3 % — ABNORMAL LOW (ref >66.8)

## 2017-08-31 LAB — PHOSPHORUS: Phosphorus: 1.8 mg/dL — ABNORMAL LOW (ref 2.5–4.6)

## 2017-08-31 LAB — ADAMTS13 ACTIVITY REFLEX

## 2017-08-31 LAB — PROCALCITONIN: Procalcitonin: 1.61 ng/mL

## 2017-08-31 LAB — HEPARIN INDUCED PLATELET AB (HIT ANTIBODY): Heparin Induced Plt Ab: 0.155 OD (ref 0.000–0.400)

## 2017-08-31 MED ORDER — TRAMADOL HCL 50 MG PO TABS
50.0000 mg | ORAL_TABLET | Freq: Four times a day (QID) | ORAL | Status: DC | PRN
  Administered 2017-08-31 – 2017-09-05 (×6): 50 mg via ORAL
  Filled 2017-08-31 (×7): qty 1

## 2017-08-31 MED ORDER — LIDOCAINE HCL (PF) 1 % IJ SOLN
INTRAMUSCULAR | Status: AC
  Filled 2017-08-31: qty 30

## 2017-08-31 MED ORDER — FUROSEMIDE 10 MG/ML IJ SOLN
40.0000 mg | Freq: Every day | INTRAMUSCULAR | Status: DC
  Administered 2017-08-31: 40 mg via INTRAVENOUS
  Filled 2017-08-31: qty 4

## 2017-08-31 MED ORDER — FUROSEMIDE 10 MG/ML IJ SOLN
20.0000 mg | Freq: Four times a day (QID) | INTRAMUSCULAR | Status: AC
  Administered 2017-08-31 – 2017-09-01 (×6): 20 mg via INTRAVENOUS
  Filled 2017-08-31 (×6): qty 2

## 2017-08-31 MED ORDER — MIDAZOLAM HCL 2 MG/2ML IJ SOLN
INTRAMUSCULAR | Status: AC
  Filled 2017-08-31: qty 2

## 2017-08-31 MED ORDER — LORAZEPAM 2 MG/ML IJ SOLN
INTRAMUSCULAR | Status: AC | PRN
  Administered 2017-08-31: 1 mg via INTRAVENOUS

## 2017-08-31 MED ORDER — NALOXONE HCL 0.4 MG/ML IJ SOLN
INTRAMUSCULAR | Status: AC
  Filled 2017-08-31: qty 1

## 2017-08-31 MED ORDER — SODIUM CHLORIDE 0.9 % IV SOLN
INTRAVENOUS | Status: AC | PRN
  Administered 2017-08-31: 10 mL/h via INTRAVENOUS

## 2017-08-31 MED ORDER — MAGNESIUM SULFATE 2 GM/50ML IV SOLN
2.0000 g | Freq: Once | INTRAVENOUS | Status: AC
Start: 2017-08-31 — End: 2017-08-31
  Administered 2017-08-31: 2 g via INTRAVENOUS
  Filled 2017-08-31: qty 50

## 2017-08-31 MED ORDER — K PHOS MONO-SOD PHOS DI & MONO 155-852-130 MG PO TABS
500.0000 mg | ORAL_TABLET | Freq: Three times a day (TID) | ORAL | Status: DC
  Administered 2017-08-31 – 2017-09-08 (×25): 500 mg via ORAL
  Filled 2017-08-31 (×27): qty 2

## 2017-08-31 MED ORDER — FENTANYL CITRATE (PF) 100 MCG/2ML IJ SOLN
INTRAMUSCULAR | Status: AC | PRN
  Administered 2017-08-31: 50 ug via INTRAVENOUS

## 2017-08-31 MED ORDER — FLUMAZENIL 0.5 MG/5ML IV SOLN
INTRAVENOUS | Status: AC
  Filled 2017-08-31: qty 5

## 2017-08-31 MED ORDER — FENTANYL CITRATE (PF) 100 MCG/2ML IJ SOLN
INTRAMUSCULAR | Status: AC
  Filled 2017-08-31: qty 2

## 2017-08-31 MED ORDER — LIDOCAINE HCL (PF) 1 % IJ SOLN
INTRAMUSCULAR | Status: AC | PRN
  Administered 2017-08-31: 2 mL

## 2017-08-31 MED ORDER — IOPAMIDOL (ISOVUE-300) INJECTION 61%
INTRAVENOUS | Status: AC
  Administered 2017-08-31: 15 mL
  Filled 2017-08-31: qty 50

## 2017-08-31 NOTE — Procedures (Signed)
Interventional Radiology Procedure Note  Procedure: Right nephrostomy tube change  Complications: None   Estimated Blood Loss: None  10 Fr PCN injection shows high-grade obstruction of distal right ureter.  Tube well positioned, but exchanged for new 10 Fr tube, attached to gravity bag.  Venetia Night. Kathlene Cote, M.D Pager:  [phone]

## 2017-08-31 NOTE — Clinical Social Work Note (Signed)
CSW continuing to follow patient progress and she is not medically stable for d/c at this time. CSW will provide SW intervention services as needed and will follow-up with patient/spouse regarding facility choice for short-term rehab.  Kristina Torres, MSW, LCSW Licensed Clinical Social Worker [place] [phone]

## 2017-08-31 NOTE — Sedation Documentation (Signed)
Patient denies pain and is resting comfortably.  

## 2017-08-31 NOTE — Sedation Documentation (Signed)
Patient is resting comfortably. 

## 2017-08-31 NOTE — Progress Notes (Signed)
Patient ID: Kristina Torres, female   DOB: [birth year], 71 y.o.   MRN: [id number]    Referring Physician(s): Dr. Truitt Merle  Supervising Physician: Aletta Edouard  Patient Status: [facility] - In-pt  Chief Complaint: lymphoma  Subjective: Patient is well-known to IR service for multiple prior procedures.  She has had a PCN placed for which she is actually down in our nurse's station now for evaluation due to decrease output and leakage around the catheter.  She has had 2 LN BXs and a request is now made for a BM BX.  Allergies: Streptomycin  Medications: Prior to Admission medications   Medication Sig Start Date End Date Taking? Authorizing Provider  allopurinol (ZYLOPRIM) 300 MG tablet Take 300 mg by mouth daily. 05/19/17  Yes [provider]  Calcium Carb-Cholecalciferol (CALCIUM 1000 + D PO) Take 1 tablet by mouth daily.   Yes [provider]  ciprofloxacin (CIPRO) 250 MG tablet Take 250 mg by mouth 2 (two) times daily. 08/15/17  Yes [provider]  hydrochlorothiazide (MICROZIDE) 12.5 MG capsule Take 12.5 mg by mouth daily.  07/19/17  Yes [provider]  methotrexate (RHEUMATREX) 2.5 MG tablet Take 17.5 mg by mouth once a week. Take 7 tabs (2.5mg  tablets) qMonday 05/20/17  Yes [provider]  ondansetron (ZOFRAN-ODT) 4 MG disintegrating tablet Take 4 mg by mouth as needed. 08/14/17  Yes [provider]  simvastatin (ZOCOR) 20 MG tablet Take 20 mg by mouth daily. 08/17/17  Yes [provider]    Vital Signs: BP (!) 100/44 (BP Location: Left Wrist)   Pulse 92   Temp 98.6 F (37 C) (Oral)   Resp 18   Ht 4' 10.5" (1.486 m)   Wt 214 lb 4.6 oz (97.2 kg)   SpO2 98%   BMI 44.02 kg/m   Physical Exam: Heart: regular Lungs: CTAB Abd: soft, NT, ND, +BS  Mallampati: One ASA: 3  Imaging: Dg Chest 1 View  Result Date: 08/30/2017 CLINICAL DATA:  Shortness of breath, hypertension, hyperlipidemia, rheumatoid arthritis. EXAM:  CHEST 1 VIEW COMPARISON:  Chest x-ray of August 25, 2017 FINDINGS: The lungs are adequately inflated. Interval improvement in the pulmonary interstitium has occurred. There remains small amount of fluid in the minor fissure on the right. Linear increased density at the left lung base persists. The heart is normal in size. The pulmonary vascularity is less prominent centrally. There is calcification in the wall of the aortic arch. There is multilevel degenerative disc disease of the thoracic spine. IMPRESSION: Decreased pulmonary interstitial edema or infiltrates bilaterally may reflect improving CHF or less likely interstitial pneumonia. Persistent atelectasis or scarring at the left base. Thoracic aortic atherosclerosis. Electronically Signed   By: David  Martinique M.D.   On: 08/30/2017 08:01    Labs:  CBC:  Recent Labs  08/27/17 0432 08/28/17 0825 08/29/17 0433 08/30/17 0316 08/30/17 1447  WBC 11.2* 9.3 5.7 7.1  --   HGB 7.2* 8.5* 8.3* 8.2*  --   HCT 21.8* 25.8* 25.0* 24.7*  --   PLT 100* 80* 63* 76* 74*    COAGS:  Recent Labs  08/20/17 2255 08/30/17 1447  INR 1.16 1.23  APTT 24 27    BMP:  Recent Labs  08/28/17 0825 08/29/17 0433 08/30/17 0316 08/31/17 0546  NA 132* 131* 129* 130*  K 3.7 3.7 3.7 4.2  CL 103 103 100* 101  CO2 22 19* 23 21*  GLUCOSE 101* 83 94 98  BUN 30* 24* 21* 15  CALCIUM 7.4* 7.2* 7.1* 6.9*  CREATININE 1.01* 0.96 1.02* 1.11*  GFRNONAA 55* 58* 54* 49*  GFRAA >60 >60 >60 57*    LIVER FUNCTION TESTS:  Recent Labs  08/20/17 1931  08/28/17 0825 08/29/17 0433 08/30/17 0316 08/31/17 0546  BILITOT 2.0*  --   --  2.2* 2.3* 2.4*  AST 29  --   --  47* 50* 55*  ALT 32  --   --  33 39 39  ALKPHOS 90  --   --  143* 173* 159*  PROT 5.2*  --   --  3.6* 3.6* 3.4*  ALBUMIN 2.8*  < > 1.8* 1.7* 1.7* 1.6*  < > = values in this interval not displayed.  Assessment and Plan: 1. New diagnosis of lymphoma We will plan to proceed with a BMBX  today. Risks and benefits discussed with the patient including, but not limited to bleeding, infection, damage to adjacent structures or low yield requiring additional tests. All of the patient's questions were answered, patient is agreeable to proceed. Consent signed and in chart.  2. PCN We will do a nephrostogram after her BX to evaluate this and make any corrections as needed.  Electronically Signed: Henreitta Cea 08/31/2017, 8:48 AM   I spent a total of 25 Minutes at the the patient's bedside AND on the patient's hospital floor or unit, greater than 50% of which was counseling/coordinating care for lymphoma

## 2017-08-31 NOTE — Progress Notes (Signed)
Oncology brief follow up note   I have spoken with pathologist Dr. Gari Crown this afternoon, the preliminary report from bone marrow aspiration showed no significant evidence of lymphoma involvement. The flow and core biopsy will be reviewed on Monday.   I appreciate Dr. Janeice Robinson help to  Do the surgical biopsy of the left supraclavicular lymph node, hopefully we'll get a more definitive diagnosis with better sample.   I spoke with urologist Dr. Alyson Ingles about her cyctoscopy and ureter stenting. Dr. Alyson Ingles will ask his partner to do the procedure on the same day of her lymph node biopsy, under general anesthesia. They usually do this procedure in the office under local anesthesia. Due to her current status, not be able to leave hospital soon, and we may start her on chemo before discharge, Dr. Alyson Ingles is willing to get this done as soon as possible in the hospital.   I have spoken with pt's husband and updated the above, he appreciated the call. He is concerned about her poor nutrition status. I encouraged her to continue nutritional supplement.  I will continue to follow up.   Kristina Torres  08/31/2017

## 2017-08-31 NOTE — Consult Note (Signed)
Cardiology Consultation:   Patient ID: Kristina Torres; [id number]; 09/23/1946   Admit date: 08/20/2017 Date of Consult: 08/31/2017  Primary Care Provider: [name], MD Primary Cardiologist: New to Dr. Meda Coffee   Patient Profile:   Kristina Torres is a 71 y.o. female with a hx of HTN, HLD and RA on Methotrexate  who is being seen today for the evaluation of LE edema at the request of Dr. Alfredia Ferguson.   No prior hx of CAD, CHF or stroke. PCP has prescribed PRN lasix of LE edema. Required x 2 in past one year.   History of Present Illness:   Ms. Fike recently treated with abx for right renal mass [place] with blockage 07/2017 . Advised to follow up with Urologist as outpatient. However presented to ER 08/20/17 with on going nausea and vomiting. In ER noted hypotensive and electrolyte abnormality. CT abdomen demonstrated right hepatic lobe with hypodense mass, moderate retroperitoneal bulky lymphadenopathy, splenomegaly, severe right hydronephrosis and hydroureter. Underwent right percutaneous nephrostomy. Lymph node biopsy is concerning for high grade lymphoma. Oncology on board. Underwent nephrostogram today demonstrates high-grade right ureteral obstruction in the upper pelvis at the level of the upper sacrum. S/p Exchange of right percutaneous nephrostomy tube. Also underwent bone marrow biopsy. Also treated with Abx for HCAP.   Echo 08/30/17 showed LVEF of 60-65%, grade 1 DD, no AM abnormality, PA pressure of 36 mm Hg. Patient has noted LE Edema for the past 3 days. No dyspnea, orthopnea, PND, chest pain, palpitation or syncope. Given IV lasix '20mg'$  x 1 yesterday and IV lasix '40mg'$  x 1 today. Patient was given fluids yesterday. Albumin 1.6. Platelets 74. D-dimer 1.28 yesterday.   CXR 08/30/17 Decreased pulmonary interstitial edema or infiltrates bilaterally may reflect improving CHF or less likely interstitial pneumonia. Persistent atelectasis or scarring at the left base.  Thoracic aortic  atherosclerosis.  Past Medical History:  Diagnosis Date  . Essential hypertension   . Gout   . HLD (hyperlipidemia)   . Rheumatoid arthritis [facility])     Past Surgical History:  Procedure Laterality Date  . APPENDECTOMY    . CARPAL TUNNEL RELEASE Right   . IR NEPHROSTOMY EXCHANGE RIGHT  08/31/2017  . IR NEPHROSTOMY PLACEMENT RIGHT  08/21/2017  . LAPAROSCOPIC SALPINGO OOPHERECTOMY Right     Inpatient Medications: Scheduled Meds: . allopurinol  300 mg Oral Daily  . dextromethorphan-guaiFENesin  1 tablet Oral BID  . fentaNYL      . furosemide  40 mg Intravenous Daily  . levofloxacin  500 mg Oral Q supper  . lidocaine (PF)      . lidocaine (PF)      . midazolam      . pantoprazole  40 mg Oral Daily  . phosphorus  500 mg Oral TID  . polyethylene glycol  17 g Oral Daily  . senna  1 tablet Oral QHS  . simvastatin  20 mg Oral Daily   Continuous Infusions: . sodium chloride     PRN Meds: acetaminophen **OR** acetaminophen, alum & mag hydroxide-simeth, guaiFENesin-dextromethorphan, ondansetron  Allergies:    Allergies  Allergen Reactions  . Streptomycin Anaphylaxis    Social History:   Social History   Social History  . Marital status: Married    Spouse name: N/A  . Number of children: N/A  . Years of education: N/A   Occupational History  . Not on file.   Social History Main Topics  . Smoking status: Former Smoker    Quit date: 08/06/2017  .  Smokeless tobacco: Never Used  . Alcohol use No  . Drug use: No  . Sexual activity: Not on file   Other Topics Concern  . Not on file   Social History Narrative  . No narrative on file    Family History:    Family History  Problem Relation Age of Onset  . CVA Mother      ROS:  Please see the history of present illness.  ROS All other ROS reviewed and negative.     Physical Exam/Data:   Vitals:   08/31/17 0954 08/31/17 0959 08/31/17 1010 08/31/17 1051  BP: 105/62 106/60 (!) 98/44 106/60  Pulse: 88 86 86 88    Resp: (!) 24 (!) 27 (!) 26 (!) 24  Temp:      TempSrc:      SpO2: 100% 100% 94% 98%  Weight:      Height:        Intake/Output Summary (Last 24 hours) at 08/31/17 1343 Last data filed at 08/30/17 2201  Gross per 24 hour  Intake              120 ml  Output             1050 ml  Net             -930 ml   Filed Weights   08/29/17 2051 08/30/17 2200 08/31/17 0500  Weight: 212 lb 8.4 oz (96.4 kg) 214 lb 3.2 oz (97.2 kg) 214 lb 4.6 oz (97.2 kg)   Body mass index is 44.02 kg/m.  General:  Well nourished, well developed, in no acute distress HEENT: normal Lymph: no adenopathy Neck: no JVD Endocrine:  No thryomegaly Vascular: No carotid bruits; FA pulses 2+ bilaterally without bruits  Cardiac:  normal S1, S2; RRR; no murmur  Lungs:  clear to auscultation bilaterally, no wheezing, rhonchi or rales  Abd: soft, nontender, no hepatomegaly  Ext: 2+ BL LE edema Musculoskeletal:  No deformities, BUE and BLE strength normal and equal Skin: warm and dry  Neuro:  CNs 2-12 intact, no focal abnormalities noted Psych:  Normal affect   EKG:  The EKG was personally reviewed and demonstrates:  NSR   Relevant CV Studies: 08/30/17 Study Conclusions  - Left ventricle: The cavity size was normal. Wall thickness was   normal. Systolic function was normal. The estimated ejection   fraction was in the range of 60% to 65%. Wall motion was normal;   there were no regional wall motion abnormalities. Doppler   parameters are consistent with abnormal left ventricular   relaxation (grade 1 diastolic dysfunction). - Pulmonary arteries: Systolic pressure was mildly increased. PA   peak pressure: 36 mm Hg (S).  Laboratory Data:  Chemistry Recent Labs Lab 08/29/17 0433 08/30/17 0316 08/31/17 0546  NA 131* 129* 130*  K 3.7 3.7 4.2  CL 103 100* 101  CO2 19* 23 21*  GLUCOSE 83 94 98  BUN 24* 21* 15  CREATININE 0.96 1.02* 1.11*  CALCIUM 7.2* 7.1* 6.9*  GFRNONAA 58* 54* 49*  GFRAA >60 >60 57*   ANIONGAP '9 6 8     '$ Recent Labs Lab 08/29/17 0433 08/30/17 0316 08/31/17 0546  PROT 3.6* 3.6* 3.4*  ALBUMIN 1.7* 1.7* 1.6*  AST 47* 50* 55*  ALT 33 39 39  ALKPHOS 143* 173* 159*  BILITOT 2.2* 2.3* 2.4*   Hematology Recent Labs Lab 08/28/17 0825 08/29/17 0433 08/30/17 0316 08/30/17 1447  WBC 9.3 5.7 7.1  --  RBC 2.62* 2.57* 2.54* 2.45*  HGB 8.5* 8.3* 8.2*  --   HCT 25.8* 25.0* 24.7*  --   MCV 98.5 97.3 97.2  --   MCH 32.4 32.3 32.3  --   MCHC 32.9 33.2 33.2  --   RDW 19.9* 19.4* 18.8*  --   PLT 80* 63* 76* 74*   Cardiac EnzymesNo results for input(s): TROPONINI in the last 168 hours. No results for input(s): TROPIPOC in the last 168 hours.  BNPNo results for input(s): BNP, PROBNP in the last 168 hours.  DDimer  Recent Labs Lab 08/30/17 1447  DDIMER 1.28*    Radiology/Studies:  Dg Chest 1 View  Result Date: 08/30/2017 CLINICAL DATA:  Shortness of breath, hypertension, hyperlipidemia, rheumatoid arthritis. EXAM: CHEST 1 VIEW COMPARISON:  Chest x-ray of August 25, 2017 FINDINGS: The lungs are adequately inflated. Interval improvement in the pulmonary interstitium has occurred. There remains small amount of fluid in the minor fissure on the right. Linear increased density at the left lung base persists. The heart is normal in size. The pulmonary vascularity is less prominent centrally. There is calcification in the wall of the aortic arch. There is multilevel degenerative disc disease of the thoracic spine. IMPRESSION: Decreased pulmonary interstitial edema or infiltrates bilaterally may reflect improving CHF or less likely interstitial pneumonia. Persistent atelectasis or scarring at the left base. Thoracic aortic atherosclerosis. Electronically Signed   By: David  [place] M.D.   On: 08/30/2017 08:01   Ct Soft Tissue Neck Wo Contrast  Result Date: 08/31/2017 CLINICAL DATA:  Recent retroperitoneal and supraclavicular lymph node biopsies concerning for lymphoma.  Evaluation for cervical lymphadenopathy. EXAM: CT NECK WITHOUT CONTRAST TECHNIQUE: Multidetector CT imaging of the neck was performed following the standard protocol without intravenous contrast. COMPARISON:  Neck CT 12/13/2010.  Chest CT 08/23/2017. FINDINGS: Pharynx and larynx: Symmetric nasopharyngeal and palatine tonsillar soft tissues. 10 x 10 mm nodular low-density focus in the left paraglottic space appears to be associated with an internal laryngocele. A small amount of asymmetric soft tissue and gas were present in this location on the prior neck CT. Salivary glands: No inflammation, mass, or stone. Thyroid: Unremarkable. Lymph nodes: Multiple enlarged left supraclavicular lymph nodes as were partially seen on the recent CT, the largest measuring 16 mm in short axis. No enlarged lymph nodes more superiorly in the left neck. No right-sided cervical lymph node enlargement. Vascular: Mild vascular calcification. Limited intracranial: Unremarkable. Visualized orbits: Unremarkable. Mastoids and visualized paranasal sinuses: Polypoid mucosal thickening or mucous retention cysts inferiorly in the left maxillary sinus. Clear mastoid air cells. Skeleton: No suspicious osseous lesion. Moderate diffuse cervical facet arthrosis. Moderate lower cervical disc degeneration. Upper chest: Partially visualized small left pleural effusion. Emphysema. Other: None. IMPRESSION: 1. Multiple enlarged left supraclavicular lymph nodes as previously seen. 2. The no evidence of lymphadenopathy in the upper left neck or right neck. 3. 10 mm low-density left paraglottic focus, favored to reflect a partially fluid-filled internal laryngocele although a small neoplasm is difficult to exclude on this unenhanced study. 4. Small left pleural effusion. 5.  Emphysema (ICD10-J43.9). Electronically Signed   By: Sebastian Ache M.D.   On: 08/31/2017 10:10   Ct Bone Marrow Biopsy & Aspiration  Result Date: 08/31/2017 CLINICAL DATA:  Lymph node  biopsy has demonstrated high-grade B-cell lymphoproliferative disorder. Bone marrow biopsy required for further workup. EXAM: CT GUIDED BONE MARROW ASPIRATION AND BIOPSY ANESTHESIA/SEDATION: Versed 1.0 mg IV, Fentanyl 50 mcg IV Total Moderate Sedation Time:  18 minutes. The  patient's level of consciousness and physiologic status were continuously monitored during the procedure by Radiology nursing. PROCEDURE: The procedure risks, benefits, and alternatives were explained to the patient. Questions regarding the procedure were encouraged and answered. The patient understands and consents to the procedure. A time out was performed prior to initiating the procedure. The left gluteal region was prepped with chlorhexidine. Sterile gown and sterile gloves were used for the procedure. Local anesthesia was provided with 1% Lidocaine. Under CT guidance, an 11 gauge On Control bone cutting needle was advanced from a posterior approach into the left iliac bone. Needle positioning was confirmed with CT. Initial non heparinized and heparinized aspirate samples were obtained of bone marrow. Core biopsy was performed via the On Control drill needle. COMPLICATIONS: None FINDINGS: Inspection of initial aspirate did reveal visible particles. Intact core biopsy sample was obtained. IMPRESSION: CT guided bone marrow biopsy of left posterior iliac bone with both aspirate and core samples obtained. Electronically Signed   By: Aletta Edouard M.D.   On: 08/31/2017 12:52   Ir Nephrostomy Exchange Right  Result Date: 08/31/2017 INDICATION: Status post right percutaneous nephrostomy tube placement on 08/21/2017 to treat hydronephrosis secondary to ureteral obstruction. There has been some poor output from the nephrostomy tube as well as leakage of urine at the tube exit site. EXAM: RIGHT PERCUTANEOUS NEPHROSTOMY TUBE EXCHANGE WITH NEPHROSTOGRAM COMPARISON:  08/21/2017 MEDICATIONS: None ANESTHESIA/SEDATION: None CONTRAST:  15 mL Isovue-300  - administered into the collecting system(s) FLUOROSCOPY TIME:  Fluoroscopy Time: 1 minute and 6 seconds. 3.6 mGy. COMPLICATIONS: None immediate. PROCEDURE: Informed written consent was obtained from the patient after a thorough discussion of the procedural risks, benefits and alternatives. All questions were addressed. Maximal Sterile Barrier Technique was utilized including caps, mask, sterile gowns, sterile gloves, sterile drape, hand hygiene and skin antiseptic. A timeout was performed prior to the initiation of the procedure. The pre-existing nephrostomy tube was injected with contrast material and fluoroscopic spot images obtained to image the collecting system and ureter. The catheter was cut and removed over a guidewire. A new 10 French nephrostomy tube was then advanced over the wire and formed. The catheter was flushed and connected to a new gravity bag. It was secured at the skin with a Prolene retention suture and StatLock device. FINDINGS: Nephrostogram shows moderate hydronephrosis as well as a dilated ureter. There is a high-grade obstruction of the distal right ureter in the upper pelvis at the level of the upper sacrum. The new nephrostomy tube was formed at the level of the renal pelvis. IMPRESSION: Exchange of right percutaneous nephrostomy tube for new catheter over a guidewire. Nephrostogram demonstrates high-grade right ureteral obstruction in the upper pelvis at the level of the upper sacrum. Electronically Signed   By: Aletta Edouard M.D.   On: 08/31/2017 12:57    Assessment and Plan:   1. Bilateral lower extremity edema - Noted for the past 3 days. Progressively worsen. Echo showed normal LVEF with grade 1 DD. Denies orthopnea, PND or dyspnea.  Likely from anemia and severe hypoalbuminemia.  D-dimer elevated to 1.28. CXR showed improving CHF or pneumonia. She is no SCDs for DVT prophylaxis.  - Will get BNP and LE doppler to rule out DVT.    Dr. Meda Coffee to see later today.   For  questions or updates, please contact [place] Please consult [email or web address] for contact info under Cardiology/STEMI.   Jarrett Soho, PA  08/31/2017 1:43 PM   The patient was seen, examined and discussed with  Bhagat,Bhavinkumar PA-C and I agree with the above.   71 y.o. female with a hx of HTN, HLD and RA on Methotrexate  who is being seen today for the evaluation of LE edema, the patient has no prior cardiac history, no CAD, CHF or stroke. PCP has prescribed PRN lasix of LE edema. Required x 2 in past one year.  Admitted in August evaluation of right renal mass [place] with blockage, CT abdomen demonstrated right hepatic lobe with hypodense mass, moderate retroperitoneal bulky lymphadenopathy, splenomegaly, severe right hydronephrosis and hydroureter. Underwent right percutaneous nephrostomy. Lymph node biopsy is concerning for high grade lymphoma. Oncology on board. Underwent nephrostogram today demonstrates high-grade right ureteral obstruction in the upper pelvis at the level of the upper sacrum. S/p Exchange of right percutaneous nephrostomy tube. Also underwent bone marrow biopsy. Also treated with Abx for HCAP.  Echo 08/30/17 showed LVEF of 60-65%, grade 1 DD, no AM abnormality, PA pressure of 36 mm Hg. Patient has noted LE Edema for the past 3 days. No dyspnea, orthopnea, PND, chest pain, palpitation or syncope. Given IV lasix '20mg'$  x 1 yesterday and IV lasix '40mg'$  x 1 today. Patient was given fluids yesterday. Na 130, Albumin 1.6. Protein 3.5. Platelets 74. Hb 8.2. D-dimer 1.28 yesterday.  Looking into Epic, he was 181 lbs on 9/3, 200 lbs on 9/7 and is 214lbs today. On physical exam there are mild Crackles at both lung bases, and  2+ LE edema B/L. There is severe hypoalbuminemia that is worsening 2.8->1.8 within 10 days. We would recommend lasix 20 mg iv Q6H given the hypotension, replacing potassium, checking LE [place] for DVT in the settings of underlying ca.   Ena Dawley,  MD 08/31/2017

## 2017-08-31 NOTE — Procedures (Signed)
Interventional Radiology Procedure Note  Procedure: CT guided aspirate and core biopsy of right iliac bone Complications: None Recommendations: - Bedrest supine x 1 hrs - Follow biopsy results  Darcie Mellone T. Annistyn Depass, M.D Pager:  [phone]   

## 2017-08-31 NOTE — Progress Notes (Signed)
PROGRESS NOTE    Kristina Torres  OXB:[id number] DOB: 05-Jul-1946 DOA: 08/20/2017 PCP: [name], MD   Brief Narrative:  Kristina Torres is a 71 y.o. female, PMH of HTN, HLD, gout, RA on MTX and renal mass presented to ED on 08/20/17 with complaints of nausea, nonbloody emesis, RUQ abdominal pain, lightheadedness, generalized weakness and cough. She was seen at [facility] on Tuesday prior to admission and found to have "right renal mass on ultrasound with blockage", started on oral ciprofloxacin for UTI and was supposed to see Urologist on Thursday with a CT scan. In the ED, transiently hypotensive, noted to have acute kidney injury with creatinine of 2.08, hypercalcemia with calcium of 13.87, oxygen saturation 90% on room air and CT abdomen demonstrated right hepatic lobe with hypodense mass, moderate retroperitoneal bulky lymphadenopathy, splenomegaly, severe right hydronephrosis and hydroureter. Urology consulted. IR placed right percutaneous nephrostomy. Oncology consulted and underwent retroperitoneal lymph node biopsy 9/6, preliminary pathology shows malignancy, suspicious for lymphoma but inadequate sample and hence oncology had IR do additional biopsy on 9/7-results showed ? Highgrade Lymphoma. Hypercalcemia resolved. Mental status slowly improving. Patient still feels weak.   Patient stated that Nephrostomy tube stopped working so IR was consulted and ordered a Nephrostogram and it was replaced today. Dr. Burr Medico of Oncology re-evaluated and because cannot definitively call LNBiopsy High grade Lymphoma, FISH panel was ordered and Surgical Biopsy would be necessary for definitive Diagnosis. CT of Neck to evaluate adenopathy ordered by Heme/Onc and Dr. Burr Medico spoke with Dr. Constance Holster who will do surgical biopsy of Left Supraclavicular LN. Patient underwent Bone Marrow Biopsy this AM and Dr. Burr Medico spoke with Dr. Gari Crown of Pathology who states the flow and Core Biopsy will be Monday.  Cardiology  evaluated and recommending IV Lasix 20 mg q6h, addressing Nutritional Status, and Checking LE [place] for DVT's. Dr. Burr Medico spoke with Dr. Alyson Ingles of Urology and patient to undergo cystoscopy and ureter stenting the same day as LN Biopsy.   Assessment & Plan:   Principal Problem:   Nausea & vomiting Active Problems:   Rheumatoid arthritis ([place])   Essential hypertension   Gout   UTI (urinary tract infection)   Sepsis (HCC)   HLD (hyperlipidemia)   Hypotension   AKI (acute kidney injury) ([place])   Hypercalcemia   Thrombocytopenia (HCC)   Cough   Macrocytic anemia   Lymphadenopathy   Hypophosphatemia   Hypomagnesemia   Hyponatremia   Acute urinary retention   Hyperbilirubinemia  Severe right hydronephrosis and hydroureter:  -Urology consulted 9/4 and recommended nephrostomy tube placement by IR which was done same day. Urine cultures negative.  - Discussed with consulting Urologist on 9/7, recommends DC with percutaneous nephrostomy until outpatient follow-up with Dr. Karsten Ro regarding further evaluation including cystoscopy etc. He indicated that bladder mass could well be lymphoma. -Discussed with Urology for Inpatient Cystoscopy and possible Biopsy; Discussed with Dr. Alyson Ingles and he states cystoscopy is not possible in the hospital however after Dr. Ernestina Penna discussion patient to undergo cystoscopy and ureter stenting on the same day of LN Biopsy -Right PCN stopped draining so IR re-consulted and Patient to underwent Nephrostogram Nephrostomy Tube Exchange  Acute Urinary Retention -Foley Catheter Placed as Bladder Scan showed >511 mL of Urine yesterady -Will need follow up with Urology as an outpatient and will need to follow up with Dr. Karsten Ro as an outpatient however will have Cystoscopy likely done next week.    Nausea and vomiting:  - Secondary to obstructive uropathy and acute kidney injury. -  Resolved.   Hypercalcemia:  -May be secondary to underlying malignancy/lymphoma  complicated by use of Tums, HCTZ, calcium and vitamin D supplements at home which have been held.  -Corrected calcium was as high as 14.5 on 9/6.  -Treated with IV normal saline, IV Lasix, a dose of pamidronate 60 mg on 6/5, IM calcitonin 4 doses. Vitamin D, 25-hydroxy: 30.2. PTH related peptide: <2.0  PTH: 16.  - Resolved.  Anemia:  -As per oncology, likely related to methotrexate and anemia of chronic disease.  -Anemia panel: Iron 29, TIBC 214, saturation 14, ferritin 745, folate 9.6, B12: 767.  - As per oncology, transfuse to maintain hgb >'8mg'$ /dL. -Transfused 1u PRBC 9/10. This seemed to improve mentation considerably. - CBC showed Hb/Hct of 8.2/24.7 - Hold methotrexate per oncology - Patient to undergo Bone Marrow Biopsy in AM   Thrombocytopenia:  -Worsening. Platelet Count went from 100 -> 80 -> 63 -> 74 -> 69 -Stable without bleeding. As per oncology, likely related to methotrexate or autoimmune mediated. -Will need to rule out HIT so will send Ab -Bone Marrow Biopsy done this AM -DIC Panel ordered by Hematology and showed Elevated D-Dimer at 1.28 and Fibrinogen Level  -Continue to Monitor and discussed with Oncology and appreciate Recc's -[id number] ordered to r/o TTP was 44.3% -Continue to Follow Onc Recc's and repeat CBC in AM  Hypokalemia:  -Improved -Continue to Monitor and Replete as Necessary -Repeat CMP in AM    Elevated lactate:  -Likely due to dehydration and acute kidney injury. Improved.  AKI:  -SCr 2.08 on admission up from baseline of 0.9 - 1.0 due to obstructive uropathy.  - Resolved with PCN. - BUN/Cr now 15/1.11  Acute hypoxic respiratory failure:  - Due to pneumonia as below. - Resolved with treatments as below  HCAP, Lobar Pneumonia (left mid and lower lung):  -CT chest shows mild posterior lower lobe bronchiectasis with associated consolidation. Developed fever of 101F on 9/7. Cultures drawn and initiated IV vancomycin and Zosyn.    -Pro-calcitonin 1.48 -> 1.67 -> 2.03 ->1.61 . Repeat blood cultures from 9/7: Negative to date.  -Chest x-ray 9/8 confirmed pneumonia.  - Since cultures continue to be negative, DC'ed vancomycin. Continues to improve, so will transition zosyn > po Levaquin - Afebrile. WBC increased to 13.7 -CXR this AM showed Decreased pulmonary interstitial edema or infiltrates bilaterally may reflect improving CHF or less likely interstitial pneumonia. Persistent atelectasis or scarring at the left base. Thoracic aortic atherosclerosis  Essential Hypertension:  -HCTZ held. Blood pressure is currently soft off antihypertensives.  Gout:  - No acute flare.  - Continue allopurinol.  Hyperlipidemia:  - Chronic, stable - Continue statin  Liver mass:  -CT abdomen showed vague hypodense mass with central calcification in the right hepatic lobe presumably corresponding to the MRI mass from 2011 which was thought to represent hemangioma.  -CT also showed moderate retroperitoneal somewhat bulky lymphadenopathy and splenomegaly raising concerns for metastatic disease or lymphoproliferative abnormality such as lymphoma.  -Oncology consulted 9/4 and indicate that the liver lesion is not suspicious for malignancy.  -As per oncology, based on initial RP lymph node biopsy, lymphoma is suspected but inadequate tissue sample and findings could also be due to lymphoproliferative disorder secondary to MTX or RA. Second biopsy off left [place] node done on 9/7, results showed ? Highgrade Lymphoma.  - Dr. Burr Medico is coordinating with pathology regarding results and will follow-up patient after discussion with Pathologist    Rheumatoid arthritis: Chronic, stable.  - Stopping  methotrexate - Will need rheumatology follow up  Urethral/bladder mass:  - As per urology consultation, outpatient cystoscopy but will discuss with now will likely happen as an inpatient -?Lymphoma. -As Above  Acute Metabolic Encephalopathy, improved   -Likely related to acute kidney injury and hypercalcemia (likely the main cause) complicating underlying possible mild dementia. No focal deficits. CT head without acute findings.  - Avoid sedating medications. Encouraged sleep hygiene.  - Consider neurocognitive testing as outpatient  Fall:  -Sustained a few days ago in her hospital room. No injury sustained.  - As per PT, SNF at time of discharge.  Nutrition:  - Poor oral intake as per family.  - Dietitian consulted.  - Encouraged oral intake. May be slowly increasing.  Hypophosphatemia -Patient's Phos Level was 1.6 and improved to 1.8 -Replete with IV KPhos 30 mmol yesterday and will add po 500 mg KPhos TID -Continue to Monitor and Replete Mag as Necessary -Repeat Phos Level in AM   Hypomagnesemia -Patient's Mag Level was 1.5 -Replete with IV Mag Sulfate 2 grams  -Continue to Monitor and Replete Mag as Necessary -Repeat Mag Level in AM   Hyponatremia -Sodium went from 142 -> 131 -> 129 -> 130 -Suspect Hypervolemia Hyponatremia; Getting IV Lasix 20 mg q6h per Cards -Continue to Monitor;  -Repeat CMP in AM   Hyperbilirubinemia -T Bili was 2.4 -DIC Panel being ordered by Oncology and as above -Continue to Monitor and Repeat LFT's in AM   Bilateral LE Edema suspect Acute Diastolic CHF Exacerbation vs Poor Nutritional Status r/o DVT -ECHO ordered and showed EF of 60-65% with G1DD -Strict I's/O's; Patient is +0.204 Liters -Daily Weights; Patient is +37 lbs -Fluid Restrict  -Consulted with Cardiology and recommending IV Lasix 20 mg q6h and checking LE Dopplers to r/o DVT   Adenopathy -Suspicious for Lymphoma -CT Neck ordered by Oncology and showed Multiple enlarged left supraclavicular lymph nodes as previously seen.  The no evidence of lymphadenopathy in the upper left neck or right neck.  10 mm low-density left paraglottic focus, favored to reflect a partially fluid-filled internal laryngocele although a small neoplasm  is difficult to exclude on this unenhanced study.  Small left pleural effusion. Emphysema  -ENT Dr. Constance Holster consulted and will perform surgical excision of supraclavicular LN to work up for Lymphoma and will be done on Wednesday of next week.   Hx of Gout -C/w Allopurinol  DVT prophylaxis: SCDs Code Status: FULL CODE Family Communication: Discussed Husband and family at bedside Disposition Plan: Remain Inpatient for further Workup  Consultants:   Urology  Medical Oncology  Interventional Radiology  Cardiology  ENT    Procedures: Right percutaneous nephrostomy Retroperitoneal lymph node biopsy 9/6. Left supraclavicular lymph node biopsy 9/7 Right Percutaneous nephrostomy exchange 9/14 Bone Marrow Biopsy 9/14   Antimicrobials:  Anti-infectives    Start     Dose/Rate Route Frequency Ordered Stop   08/28/17 1815  levofloxacin (LEVAQUIN) tablet 500 mg     500 mg Oral Daily with supper 08/28/17 1804 08/31/17 1744   08/28/17 1700  levofloxacin (LEVAQUIN) 25 MG/ML solution 500 mg  Status:  Discontinued     500 mg Oral Daily with supper 08/28/17 1432 08/28/17 1804   08/25/17 0800  vancomycin (VANCOCIN) IVPB 750 mg/150 ml premix  Status:  Discontinued     750 mg 150 mL/hr over 60 Minutes Intravenous Every 12 hours 08/24/17 1720 08/27/17 1601   08/24/17 1800  vancomycin (VANCOCIN) 2,000 mg in sodium chloride 0.9 % 500 mL  IVPB     2,000 mg 250 mL/hr over 120 Minutes Intravenous  Once 08/24/17 1720 08/24/17 2036   08/24/17 1800  piperacillin-tazobactam (ZOSYN) IVPB 3.375 g  Status:  Discontinued     3.375 g 12.5 mL/hr over 240 Minutes Intravenous Every 8 hours 08/24/17 1720 08/28/17 1432   08/21/17 2000  cefTRIAXone (ROCEPHIN) 1 g in dextrose 5 % 50 mL IVPB  Status:  Discontinued     1 g 100 mL/hr over 30 Minutes Intravenous Every 24 hours 08/20/17 1957 08/22/17 1709   08/21/17 1330  ciprofloxacin (CIPRO) IVPB 400 mg     400 mg 200 mL/hr over 60 Minutes Intravenous To Radiology  08/21/17 1319 08/21/17 1430   08/20/17 1945  cefTRIAXone (ROCEPHIN) 2 g in dextrose 5 % 50 mL IVPB     2 g 100 mL/hr over 30 Minutes Intravenous  Once 08/20/17 1932 08/20/17 2242     Subjective: Seen and examined at bedside after testing and states that her legs feel less swollen after the lasix. No nausea or vomiting. Did not sleep well because of back pain. No other complaints or concerns at this time.   Objective: Vitals:   08/31/17 0959 08/31/17 1010 08/31/17 1051 08/31/17 1733  BP: 106/60 (!) 98/44 106/60 (!) 92/50  Pulse: 86 86 88 (!) 113  Resp: (!) 27 (!) 26 (!) 24 20  Temp:    98.6 F (37 C)  TempSrc:    Oral  SpO2: 100% 94% 98% 95%  Weight:      Height:        Intake/Output Summary (Last 24 hours) at 08/31/17 1955 Last data filed at 08/31/17 1300  Gross per 24 hour  Intake              240 ml  Output             1350 ml  Net            -1110 ml   Filed Weights   08/29/17 2051 08/30/17 2200 08/31/17 0500  Weight: 96.4 kg (212 lb 8.4 oz) 97.2 kg (214 lb 3.2 oz) 97.2 kg (214 lb 4.6 oz)   Examination: Physical Exam:  Constitutional: Pleasant obese Caucasian female in NAD Eyes: Sclerae anicteric. Conjunctivae non-injected ENMT: Grossly normal hearing. Mucous membranes appear moist Neck: Supple with no JVD Respiratory: Diminished but no appreciable wheezing/rales. Has some mild crackles. Patient was not tachypenic or using any accessory muscles to breathe Cardiovascular: RRR; S1 S2; 2+ LE Edema Abdomen: Soft, NT, Distended due to body habitus. Bowel sounds present GU: Deferred Musculoskeletal: No contractures; No cyanosis Skin: Warm and dry.  Neurologic: CN 2-12 grossly intact. No appreciable focal deficits Psychiatric: Pleasant mood and affect. Intact judgement and insight  Data Reviewed: I have personally reviewed following labs and imaging studies  CBC:  Recent Labs Lab 08/27/17 0432 08/28/17 0825 08/29/17 0433 08/30/17 0316 08/30/17 1447  08/31/17 1316  WBC 11.2* 9.3 5.7 7.1  --  13.2*  NEUTROABS  --   --   --  4.5  --  9.2*  HGB 7.2* 8.5* 8.3* 8.2*  --  8.6*  HCT 21.8* 25.8* 25.0* 24.7*  --  25.6*  MCV 103.3* 98.5 97.3 97.2  --  98.1  PLT 100* 80* 63* 76* 74* 69*   Basic Metabolic Panel:  Recent Labs Lab 08/27/17 0432 08/28/17 0825 08/29/17 0433 08/30/17 0316 08/31/17 0546  NA 135 132* 131* 129* 130*  K 4.2 3.7 3.7 3.7 4.2  CL  105 103 103 100* 101  CO2 25 22 19* 23 21*  GLUCOSE 99 101* 83 94 98  BUN 32* 30* 24* 21* 15  CREATININE 1.03* 1.01* 0.96 1.02* 1.11*  CALCIUM 7.8* 7.4* 7.2* 7.1* 6.9*  MG  --   --  1.4* 1.7 1.5*  PHOS  --   --  1.3* 1.6* 1.8*   GFR: Estimated Creatinine Clearance: 47 mL/min (A) (by C-G formula based on SCr of 1.11 mg/dL (H)). Liver Function Tests:  Recent Labs Lab 08/27/17 0432 08/28/17 0825 08/29/17 0433 08/30/17 0316 08/31/17 0546  AST  --   --  47* 50* 55*  ALT  --   --  33 39 39  ALKPHOS  --   --  143* 173* 159*  BILITOT  --   --  2.2* 2.3* 2.4*  PROT  --   --  3.6* 3.6* 3.4*  ALBUMIN 1.7* 1.8* 1.7* 1.7* 1.6*   No results for input(s): LIPASE, AMYLASE in the last 168 hours. No results for input(s): AMMONIA in the last 168 hours. Coagulation Profile:  Recent Labs Lab 08/30/17 1447  INR 1.23   Cardiac Enzymes: No results for input(s): CKTOTAL, CKMB, CKMBINDEX, TROPONINI in the last 168 hours. BNP (last 3 results) No results for input(s): PROBNP in the last 8760 hours. HbA1C: No results for input(s): HGBA1C in the last 72 hours. CBG:  Recent Labs Lab 08/27/17 0748  GLUCAP 103*   Lipid Profile: No results for input(s): CHOL, HDL, LDLCALC, TRIG, CHOLHDL, LDLDIRECT in the last 72 hours. Thyroid Function Tests: No results for input(s): TSH, T4TOTAL, FREET4, T3FREE, THYROIDAB in the last 72 hours. Anemia Panel:  Recent Labs  08/30/17 1447  RETICCTPCT 2.8   Sepsis Labs:  Recent Labs Lab 08/25/17 0743 08/27/17 0432 08/29/17 0433 08/31/17 0546   PROCALCITON 1.48 1.67 2.03 1.61    Recent Results (from the past 240 hour(s))  Culture, blood (Routine X 2) w Reflex to ID Panel     Status: None   Collection Time: 08/24/17  6:13 PM  Result Value Ref Range Status   Specimen Description BLOOD LEFT HAND  Final   Special Requests   Final    IN PEDIATRIC BOTTLE Blood Culture results may not be optimal due to an excessive volume of blood received in culture bottles   Culture NO GROWTH 5 DAYS  Final   Report Status 08/29/2017 FINAL  Final  Culture, blood (Routine X 2) w Reflex to ID Panel     Status: None   Collection Time: 08/24/17  6:14 PM  Result Value Ref Range Status   Specimen Description BLOOD RIGHT HAND  Final   Special Requests   Final    BOTTLES DRAWN AEROBIC AND ANAEROBIC Blood Culture adequate volume   Culture NO GROWTH 5 DAYS  Final   Report Status 08/29/2017 FINAL  Final  Urine Culture     Status: None   Collection Time: 08/24/17 11:01 PM  Result Value Ref Range Status   Specimen Description URINE, CLEAN CATCH  Final   Special Requests NONE  Final   Culture NO GROWTH  Final   Report Status 08/26/2017 FINAL  Final    Radiology Studies: Dg Chest 1 View  Result Date: 08/30/2017 CLINICAL DATA:  Shortness of breath, hypertension, hyperlipidemia, rheumatoid arthritis. EXAM: CHEST 1 VIEW COMPARISON:  Chest x-ray of August 25, 2017 FINDINGS: The lungs are adequately inflated. Interval improvement in the pulmonary interstitium has occurred. There remains small amount of fluid in the  minor fissure on the right. Linear increased density at the left lung base persists. The heart is normal in size. The pulmonary vascularity is less prominent centrally. There is calcification in the wall of the aortic arch. There is multilevel degenerative disc disease of the thoracic spine. IMPRESSION: Decreased pulmonary interstitial edema or infiltrates bilaterally may reflect improving CHF or less likely interstitial pneumonia. Persistent  atelectasis or scarring at the left base. Thoracic aortic atherosclerosis. Electronically Signed   By: David  Martinique M.D.   On: 08/30/2017 08:01   Ct Soft Tissue Neck Wo Contrast  Result Date: 08/31/2017 CLINICAL DATA:  Recent retroperitoneal and supraclavicular lymph node biopsies concerning for lymphoma. Evaluation for cervical lymphadenopathy. EXAM: CT NECK WITHOUT CONTRAST TECHNIQUE: Multidetector CT imaging of the neck was performed following the standard protocol without intravenous contrast. COMPARISON:  Neck CT 12/13/2010.  Chest CT 08/23/2017. FINDINGS: Pharynx and larynx: Symmetric nasopharyngeal and palatine tonsillar soft tissues. 10 x 10 mm nodular low-density focus in the left paraglottic space appears to be associated with an internal laryngocele. A small amount of asymmetric soft tissue and gas were present in this location on the prior neck CT. Salivary glands: No inflammation, mass, or stone. Thyroid: Unremarkable. Lymph nodes: Multiple enlarged left supraclavicular lymph nodes as were partially seen on the recent CT, the largest measuring 16 mm in short axis. No enlarged lymph nodes more superiorly in the left neck. No right-sided cervical lymph node enlargement. Vascular: Mild vascular calcification. Limited intracranial: Unremarkable. Visualized orbits: Unremarkable. Mastoids and visualized paranasal sinuses: Polypoid mucosal thickening or mucous retention cysts inferiorly in the left maxillary sinus. Clear mastoid air cells. Skeleton: No suspicious osseous lesion. Moderate diffuse cervical facet arthrosis. Moderate lower cervical disc degeneration. Upper chest: Partially visualized small left pleural effusion. Emphysema. Other: None. IMPRESSION: 1. Multiple enlarged left supraclavicular lymph nodes as previously seen. 2. The no evidence of lymphadenopathy in the upper left neck or right neck. 3. 10 mm low-density left paraglottic focus, favored to reflect a partially fluid-filled internal  laryngocele although a small neoplasm is difficult to exclude on this unenhanced study. 4. Small left pleural effusion. 5.  Emphysema (ICD10-J43.9). Electronically Signed   By: Logan Bores M.D.   On: 08/31/2017 10:10   Ct Bone Marrow Biopsy & Aspiration  Result Date: 08/31/2017 CLINICAL DATA:  Lymph node biopsy has demonstrated high-grade B-cell lymphoproliferative disorder. Bone marrow biopsy required for further workup. EXAM: CT GUIDED BONE MARROW ASPIRATION AND BIOPSY ANESTHESIA/SEDATION: Versed 1.0 mg IV, Fentanyl 50 mcg IV Total Moderate Sedation Time:  18 minutes. The patient's level of consciousness and physiologic status were continuously monitored during the procedure by Radiology nursing. PROCEDURE: The procedure risks, benefits, and alternatives were explained to the patient. Questions regarding the procedure were encouraged and answered. The patient understands and consents to the procedure. A time out was performed prior to initiating the procedure. The left gluteal region was prepped with chlorhexidine. Sterile gown and sterile gloves were used for the procedure. Local anesthesia was provided with 1% Lidocaine. Under CT guidance, an 11 gauge On Control bone cutting needle was advanced from a posterior approach into the left iliac bone. Needle positioning was confirmed with CT. Initial non heparinized and heparinized aspirate samples were obtained of bone marrow. Core biopsy was performed via the On Control drill needle. COMPLICATIONS: None FINDINGS: Inspection of initial aspirate did reveal visible particles. Intact core biopsy sample was obtained. IMPRESSION: CT guided bone marrow biopsy of left posterior iliac bone with both aspirate and core  samples obtained. Electronically Signed   By: Aletta Edouard M.D.   On: 08/31/2017 12:52   Ir Nephrostomy Exchange Right  Result Date: 08/31/2017 INDICATION: Status post right percutaneous nephrostomy tube placement on 08/21/2017 to treat  hydronephrosis secondary to ureteral obstruction. There has been some poor output from the nephrostomy tube as well as leakage of urine at the tube exit site. EXAM: RIGHT PERCUTANEOUS NEPHROSTOMY TUBE EXCHANGE WITH NEPHROSTOGRAM COMPARISON:  08/21/2017 MEDICATIONS: None ANESTHESIA/SEDATION: None CONTRAST:  15 mL Isovue-300 - administered into the collecting system(s) FLUOROSCOPY TIME:  Fluoroscopy Time: 1 minute and 6 seconds. 3.6 mGy. COMPLICATIONS: None immediate. PROCEDURE: Informed written consent was obtained from the patient after a thorough discussion of the procedural risks, benefits and alternatives. All questions were addressed. Maximal Sterile Barrier Technique was utilized including caps, mask, sterile gowns, sterile gloves, sterile drape, hand hygiene and skin antiseptic. A timeout was performed prior to the initiation of the procedure. The pre-existing nephrostomy tube was injected with contrast material and fluoroscopic spot images obtained to image the collecting system and ureter. The catheter was cut and removed over a guidewire. A new 10 French nephrostomy tube was then advanced over the wire and formed. The catheter was flushed and connected to a new gravity bag. It was secured at the skin with a Prolene retention suture and StatLock device. FINDINGS: Nephrostogram shows moderate hydronephrosis as well as a dilated ureter. There is a high-grade obstruction of the distal right ureter in the upper pelvis at the level of the upper sacrum. The new nephrostomy tube was formed at the level of the renal pelvis. IMPRESSION: Exchange of right percutaneous nephrostomy tube for new catheter over a guidewire. Nephrostogram demonstrates high-grade right ureteral obstruction in the upper pelvis at the level of the upper sacrum. Electronically Signed   By: Aletta Edouard M.D.   On: 08/31/2017 12:57    Scheduled Meds: . allopurinol  300 mg Oral Daily  . dextromethorphan-guaiFENesin  1 tablet Oral BID  .  fentaNYL      . furosemide  20 mg Intravenous Q6H  . lidocaine (PF)      . lidocaine (PF)      . midazolam      . pantoprazole  40 mg Oral Daily  . phosphorus  500 mg Oral TID  . polyethylene glycol  17 g Oral Daily  . senna  1 tablet Oral QHS  . simvastatin  20 mg Oral Daily   Continuous Infusions: . sodium chloride      LOS: 11 days   Kerney Elbe, DO [facility] Pager [phone]  If 7PM-7AM, please contact night-coverage [email or web address] Password [facility] 08/31/2017, 7:55 PM

## 2017-08-31 NOTE — Consult Note (Signed)
Reason for Consult:supraclavicular lymphadenopathy Referring Physician: Kerney Elbe, DO  Kristina Torres is an 71 y.o. female.  HPI: undergoing workup for suspected lymphoma. Found to have supraclavicular adenopathy on the left.  Past Medical History:  Diagnosis Date  . Essential hypertension   . Gout   . HLD (hyperlipidemia)   . Rheumatoid arthritis [facility])     Past Surgical History:  Procedure Laterality Date  . APPENDECTOMY    . CARPAL TUNNEL RELEASE Right   . IR NEPHROSTOMY EXCHANGE RIGHT  08/31/2017  . IR NEPHROSTOMY PLACEMENT RIGHT  08/21/2017  . LAPAROSCOPIC SALPINGO OOPHERECTOMY Right     Family History  Problem Relation Age of Onset  . CVA Mother     Social History:  reports that she quit smoking about 3 weeks ago. She has never used smokeless tobacco. She reports that she does not drink alcohol or use drugs.  Allergies:  Allergies  Allergen Reactions  . Streptomycin Anaphylaxis    Medications: Reviewed  Results for orders placed or performed during the hospital encounter of 08/20/17 (from the past 48 hour(s))  CBC with Differential/Platelet     Status: Abnormal   Collection Time: 08/30/17  3:16 AM  Result Value Ref Range   WBC 7.1 4.0 - 10.5 K/uL   RBC 2.54 (L) 3.87 - 5.11 MIL/uL   Hemoglobin 8.2 (L) 12.0 - 15.0 g/dL   HCT 24.7 (L) 36.0 - 46.0 %   MCV 97.2 78.0 - 100.0 fL   MCH 32.3 26.0 - 34.0 pg   MCHC 33.2 30.0 - 36.0 g/dL   RDW 18.8 (H) 11.5 - 15.5 %   Platelets 76 (L) 150 - 400 K/uL    Comment: REPEATED TO VERIFY CONSISTENT WITH PREVIOUS RESULT    Neutrophils Relative % 64 %   Lymphocytes Relative 27 %   Monocytes Relative 8 %   Eosinophils Relative 0 %   Basophils Relative 1 %   Neutro Abs 4.5 1.7 - 7.7 K/uL   Lymphs Abs 1.9 0.7 - 4.0 K/uL   Monocytes Absolute 0.6 0.1 - 1.0 K/uL   Eosinophils Absolute 0.0 0.0 - 0.7 K/uL   Basophils Absolute 0.1 0.0 - 0.1 K/uL   RBC Morphology POLYCHROMASIA PRESENT    WBC Morphology ATYPICAL  LYMPHOCYTES   Comprehensive metabolic panel     Status: Abnormal   Collection Time: 08/30/17  3:16 AM  Result Value Ref Range   Sodium 129 (L) 135 - 145 mmol/L   Potassium 3.7 3.5 - 5.1 mmol/L   Chloride 100 (L) 101 - 111 mmol/L   CO2 23 22 - 32 mmol/L   Glucose, Bld 94 65 - 99 mg/dL   BUN 21 (H) 6 - 20 mg/dL   Creatinine, Ser 1.02 (H) 0.44 - 1.00 mg/dL   Calcium 7.1 (L) 8.9 - 10.3 mg/dL   Total Protein 3.6 (L) 6.5 - 8.1 g/dL   Albumin 1.7 (L) 3.5 - 5.0 g/dL   AST 50 (H) 15 - 41 U/L   ALT 39 14 - 54 U/L   Alkaline Phosphatase 173 (H) 38 - 126 U/L   Total Bilirubin 2.3 (H) 0.3 - 1.2 mg/dL   GFR calc non Af Amer 54 (L) >60 mL/min   GFR calc Af Amer >60 >60 mL/min    Comment: (NOTE) The eGFR has been calculated using the CKD EPI equation. This calculation has not been validated in all clinical situations. eGFR's persistently <60 mL/min signify possible Chronic Kidney Disease.    Anion gap 6  5 - 15  Magnesium     Status: None   Collection Time: 08/30/17  3:16 AM  Result Value Ref Range   Magnesium 1.7 1.7 - 2.4 mg/dL  Phosphorus     Status: Abnormal   Collection Time: 08/30/17  3:16 AM  Result Value Ref Range   Phosphorus 1.6 (L) 2.5 - 4.6 mg/dL  Pathologist smear review     Status: None   Collection Time: 08/30/17  3:16 AM  Result Value Ref Range   Path Review Normocytic anemia.     Comment: Thrombocytopenia. Rare blast like cell present. Further work up if clinically indicated. Reviewed by Casimer Lanius, MD 08/30/17.   DIC (disseminated intravasc coag) panel     Status: Abnormal   Collection Time: 08/30/17  2:47 PM  Result Value Ref Range   Prothrombin Time 15.4 (H) 11.4 - 15.2 seconds   INR 1.23    aPTT 27 24 - 36 seconds   Fibrinogen 246 210 - 475 mg/dL   D-Dimer, Quant 1.28 (H) 0.00 - 0.50 ug/mL-FEU    Comment: (NOTE) At the manufacturer cut-off of 0.50 ug/mL FEU, this assay has been documented to exclude PE with a sensitivity and negative predictive value of 97  to 99%.  At this time, this assay has not been approved by the FDA to exclude DVT/VTE. Results should be correlated with clinical presentation.    Platelets 74 (L) 150 - 400 K/uL    Comment: REPEATED TO VERIFY CONSISTENT WITH PREVIOUS RESULT    Smear Review NO SCHISTOCYTES SEEN   Haptoglobin     Status: None   Collection Time: 08/30/17  2:47 PM  Result Value Ref Range   Haptoglobin 109 34 - 200 mg/dL    Comment: (NOTE) Performed At: [facility] [place], [place] [id number] Lindon Romp MD [id number]   Lactate dehydrogenase     Status: Abnormal   Collection Time: 08/30/17  2:47 PM  Result Value Ref Range   LDH 602 (H) 98 - 192 U/L  Reticulocytes     Status: Abnormal   Collection Time: 08/30/17  2:47 PM  Result Value Ref Range   Retic Ct Pct 2.8 0.4 - 3.1 %   RBC. 2.45 (L) 3.87 - 5.11 MIL/uL   Retic Count, Absolute 68.6 19.0 - 186.0 K/uL  Procalcitonin     Status: None   Collection Time: 08/31/17  5:46 AM  Result Value Ref Range   Procalcitonin 1.61 ng/mL    Comment:        Interpretation: PCT > 0.5 ng/mL and <= 2 ng/mL: Systemic infection (sepsis) is possible, but other conditions are known to elevate PCT as well. (NOTE)         ICU PCT Algorithm               Non ICU PCT Algorithm    ----------------------------     ------------------------------         PCT < 0.25 ng/mL                 PCT < 0.1 ng/mL     Stopping of antibiotics            Stopping of antibiotics       strongly encouraged.               strongly encouraged.    ----------------------------     ------------------------------       PCT level decrease by  PCT < 0.25 ng/mL       >= 80% from peak PCT       OR PCT 0.25 - 0.5 ng/mL          Stopping of antibiotics                                             encouraged.     Stopping of antibiotics           encouraged.    ----------------------------     ------------------------------       PCT level decrease by               PCT >= 0.25 ng/mL       < 80% from peak PCT        AND PCT >= 0.5 ng/mL             Continuing antibiotics                                              encouraged.       Continuing antibiotics            encouraged.    ----------------------------     ------------------------------     PCT level increase compared          PCT > 0.5 ng/mL         with peak PCT AND          PCT >= 0.5 ng/mL             Escalation of antibiotics                                          strongly encouraged.      Escalation of antibiotics        strongly encouraged.   Comprehensive metabolic panel     Status: Abnormal   Collection Time: 08/31/17  5:46 AM  Result Value Ref Range   Sodium 130 (L) 135 - 145 mmol/L   Potassium 4.2 3.5 - 5.1 mmol/L   Chloride 101 101 - 111 mmol/L   CO2 21 (L) 22 - 32 mmol/L   Glucose, Bld 98 65 - 99 mg/dL   BUN 15 6 - 20 mg/dL   Creatinine, Ser 1.11 (H) 0.44 - 1.00 mg/dL   Calcium 6.9 (L) 8.9 - 10.3 mg/dL   Total Protein 3.4 (L) 6.5 - 8.1 g/dL   Albumin 1.6 (L) 3.5 - 5.0 g/dL   AST 55 (H) 15 - 41 U/L   ALT 39 14 - 54 U/L   Alkaline Phosphatase 159 (H) 38 - 126 U/L   Total Bilirubin 2.4 (H) 0.3 - 1.2 mg/dL   GFR calc non Af Amer 49 (L) >60 mL/min   GFR calc Af Amer 57 (L) >60 mL/min    Comment: (NOTE) The eGFR has been calculated using the CKD EPI equation. This calculation has not been validated in all clinical situations. eGFR's persistently <60 mL/min signify possible Chronic Kidney Disease.    Anion gap 8 5 - 15  Magnesium     Status: Abnormal   Collection Time: 08/31/17  5:46 AM  Result Value Ref Range   Magnesium 1.5 (L) 1.7 - 2.4 mg/dL  Phosphorus     Status: Abnormal   Collection Time: 08/31/17  5:46 AM  Result Value Ref Range   Phosphorus 1.8 (L) 2.5 - 4.6 mg/dL    Dg Chest 1 View  Result Date: 08/30/2017 CLINICAL DATA:  Shortness of breath, hypertension, hyperlipidemia, rheumatoid arthritis. EXAM: CHEST 1 VIEW COMPARISON:  Chest x-ray of  August 25, 2017 FINDINGS: The lungs are adequately inflated. Interval improvement in the pulmonary interstitium has occurred. There remains small amount of fluid in the minor fissure on the right. Linear increased density at the left lung base persists. The heart is normal in size. The pulmonary vascularity is less prominent centrally. There is calcification in the wall of the aortic arch. There is multilevel degenerative disc disease of the thoracic spine. IMPRESSION: Decreased pulmonary interstitial edema or infiltrates bilaterally may reflect improving CHF or less likely interstitial pneumonia. Persistent atelectasis or scarring at the left base. Thoracic aortic atherosclerosis. Electronically Signed   By: David  Martinique M.D.   On: 08/30/2017 08:01   Ct Soft Tissue Neck Wo Contrast  Result Date: 08/31/2017 CLINICAL DATA:  Recent retroperitoneal and supraclavicular lymph node biopsies concerning for lymphoma. Evaluation for cervical lymphadenopathy. EXAM: CT NECK WITHOUT CONTRAST TECHNIQUE: Multidetector CT imaging of the neck was performed following the standard protocol without intravenous contrast. COMPARISON:  Neck CT 12/13/2010.  Chest CT 08/23/2017. FINDINGS: Pharynx and larynx: Symmetric nasopharyngeal and palatine tonsillar soft tissues. 10 x 10 mm nodular low-density focus in the left paraglottic space appears to be associated with an internal laryngocele. A small amount of asymmetric soft tissue and gas were present in this location on the prior neck CT. Salivary glands: No inflammation, mass, or stone. Thyroid: Unremarkable. Lymph nodes: Multiple enlarged left supraclavicular lymph nodes as were partially seen on the recent CT, the largest measuring 16 mm in short axis. No enlarged lymph nodes more superiorly in the left neck. No right-sided cervical lymph node enlargement. Vascular: Mild vascular calcification. Limited intracranial: Unremarkable. Visualized orbits: Unremarkable. Mastoids and  visualized paranasal sinuses: Polypoid mucosal thickening or mucous retention cysts inferiorly in the left maxillary sinus. Clear mastoid air cells. Skeleton: No suspicious osseous lesion. Moderate diffuse cervical facet arthrosis. Moderate lower cervical disc degeneration. Upper chest: Partially visualized small left pleural effusion. Emphysema. Other: None. IMPRESSION: 1. Multiple enlarged left supraclavicular lymph nodes as previously seen. 2. The no evidence of lymphadenopathy in the upper left neck or right neck. 3. 10 mm low-density left paraglottic focus, favored to reflect a partially fluid-filled internal laryngocele although a small neoplasm is difficult to exclude on this unenhanced study. 4. Small left pleural effusion. 5.  Emphysema (ICD10-J43.9). Electronically Signed   By: Logan Bores M.D.   On: 08/31/2017 10:10   Ct Bone Marrow Biopsy & Aspiration  Result Date: 08/31/2017 CLINICAL DATA:  Lymph node biopsy has demonstrated high-grade B-cell lymphoproliferative disorder. Bone marrow biopsy required for further workup. EXAM: CT GUIDED BONE MARROW ASPIRATION AND BIOPSY ANESTHESIA/SEDATION: Versed 1.0 mg IV, Fentanyl 50 mcg IV Total Moderate Sedation Time:  18 minutes. The patient's level of consciousness and physiologic status were continuously monitored during the procedure by Radiology nursing. PROCEDURE: The procedure risks, benefits, and alternatives were explained to the patient. Questions regarding the procedure were encouraged and answered. The patient understands and consents to the procedure. A time out was performed prior to initiating the procedure. The left gluteal region was prepped with  chlorhexidine. Sterile gown and sterile gloves were used for the procedure. Local anesthesia was provided with 1% Lidocaine. Under CT guidance, an 11 gauge On Control bone cutting needle was advanced from a posterior approach into the left iliac bone. Needle positioning was confirmed with CT. Initial  non heparinized and heparinized aspirate samples were obtained of bone marrow. Core biopsy was performed via the On Control drill needle. COMPLICATIONS: None FINDINGS: Inspection of initial aspirate did reveal visible particles. Intact core biopsy sample was obtained. IMPRESSION: CT guided bone marrow biopsy of left posterior iliac bone with both aspirate and core samples obtained. Electronically Signed   By: Aletta Edouard M.D.   On: 08/31/2017 12:52   Ir Nephrostomy Exchange Right  Result Date: 08/31/2017 INDICATION: Status post right percutaneous nephrostomy tube placement on 08/21/2017 to treat hydronephrosis secondary to ureteral obstruction. There has been some poor output from the nephrostomy tube as well as leakage of urine at the tube exit site. EXAM: RIGHT PERCUTANEOUS NEPHROSTOMY TUBE EXCHANGE WITH NEPHROSTOGRAM COMPARISON:  08/21/2017 MEDICATIONS: None ANESTHESIA/SEDATION: None CONTRAST:  15 mL Isovue-300 - administered into the collecting system(s) FLUOROSCOPY TIME:  Fluoroscopy Time: 1 minute and 6 seconds. 3.6 mGy. COMPLICATIONS: None immediate. PROCEDURE: Informed written consent was obtained from the patient after a thorough discussion of the procedural risks, benefits and alternatives. All questions were addressed. Maximal Sterile Barrier Technique was utilized including caps, mask, sterile gowns, sterile gloves, sterile drape, hand hygiene and skin antiseptic. A timeout was performed prior to the initiation of the procedure. The pre-existing nephrostomy tube was injected with contrast material and fluoroscopic spot images obtained to image the collecting system and ureter. The catheter was cut and removed over a guidewire. A new 10 French nephrostomy tube was then advanced over the wire and formed. The catheter was flushed and connected to a new gravity bag. It was secured at the skin with a Prolene retention suture and StatLock device. FINDINGS: Nephrostogram shows moderate hydronephrosis as  well as a dilated ureter. There is a high-grade obstruction of the distal right ureter in the upper pelvis at the level of the upper sacrum. The new nephrostomy tube was formed at the level of the renal pelvis. IMPRESSION: Exchange of right percutaneous nephrostomy tube for new catheter over a guidewire. Nephrostogram demonstrates high-grade right ureteral obstruction in the upper pelvis at the level of the upper sacrum. Electronically Signed   By: Aletta Edouard M.D.   On: 08/31/2017 12:57    PYY:[id number] except as listed in admit H&P  Blood pressure 106/60, pulse 88, temperature 98.6 F (37 C), temperature source Oral, resp. rate (!) 24, height 4' 10.5" (1.486 m), weight 97.2 kg (214 lb 4.6 oz), SpO2 98 %.  PHYSICAL EXAM: Overall appearance:  Healthy appearing, in no distress Head:  Normocephalic, atraumatic. Ears: External ears look healthy. Nose: External nose is healthy in appearance. Internal nasal exam free of any lesions or obstruction. Oral Cavity/Pharynx:  There are no mucosal lesions or masses identified. Larynx/Hypopharynx: Deferred Neuro:  No identifiable neurologic deficits. Neck: No palpable neck masses.  Studies Reviewed: CT neck  Procedures: none   Assessment/Plan: Supraclavicular lymphadenopathy. Highly suspicious for lymphoma. Nodes are nonpalpable with standard examination.Should be amenable to surgical excision. Agree with recommendation for supraclavicular lymph node excisional biopsy for workup for lymphoma. I have the scheduled for Wednesday morning.  Kristina Torres 08/31/2017, 2:04 PM

## 2017-09-01 ENCOUNTER — Inpatient Hospital Stay (HOSPITAL_COMMUNITY): Payer: Medicare Other

## 2017-09-01 DIAGNOSIS — R609 Edema, unspecified: Secondary | ICD-10-CM

## 2017-09-01 LAB — URINALYSIS, ROUTINE W REFLEX MICROSCOPIC
BILIRUBIN URINE: NEGATIVE
GLUCOSE, UA: NEGATIVE mg/dL
KETONES UR: NEGATIVE mg/dL
Leukocytes, UA: NEGATIVE
NITRITE: NEGATIVE
PH: 5 (ref 5.0–8.0)
Protein, ur: NEGATIVE mg/dL
SPECIFIC GRAVITY, URINE: 1.01 (ref 1.005–1.030)
Squamous Epithelial / LPF: NONE SEEN

## 2017-09-01 LAB — MAGNESIUM: MAGNESIUM: 1.7 mg/dL (ref 1.7–2.4)

## 2017-09-01 LAB — COMPREHENSIVE METABOLIC PANEL
ALT: 37 U/L (ref 14–54)
AST: 54 U/L — AB (ref 15–41)
Albumin: 1.6 g/dL — ABNORMAL LOW (ref 3.5–5.0)
Alkaline Phosphatase: 138 U/L — ABNORMAL HIGH (ref 38–126)
Anion gap: 6 (ref 5–15)
BUN: 20 mg/dL (ref 6–20)
CALCIUM: 6.5 mg/dL — AB (ref 8.9–10.3)
CO2: 24 mmol/L (ref 22–32)
Chloride: 102 mmol/L (ref 101–111)
Creatinine, Ser: 1.35 mg/dL — ABNORMAL HIGH (ref 0.44–1.00)
GFR calc Af Amer: 45 mL/min — ABNORMAL LOW (ref >60)
GFR, EST NON AFRICAN AMERICAN: 38 mL/min — AB (ref >60)
Glucose, Bld: 84 mg/dL (ref 65–99)
POTASSIUM: 3.8 mmol/L (ref 3.5–5.1)
Sodium: 132 mmol/L — ABNORMAL LOW (ref 135–145)
TOTAL PROTEIN: 3.4 g/dL — AB (ref 6.5–8.1)
Total Bilirubin: 2.2 mg/dL — ABNORMAL HIGH (ref 0.3–1.2)

## 2017-09-01 LAB — PREPARE RBC (CROSSMATCH)

## 2017-09-01 LAB — CBC WITH DIFFERENTIAL/PLATELET
BLASTS: 0 %
Band Neutrophils: 0 %
Basophils Absolute: 0 10*3/uL (ref 0.0–0.1)
Basophils Relative: 0 %
Eosinophils Absolute: 0 10*3/uL (ref 0.0–0.7)
Eosinophils Relative: 0 %
HEMATOCRIT: 21.3 % — AB (ref 36.0–46.0)
Hemoglobin: 7.3 g/dL — ABNORMAL LOW (ref 12.0–15.0)
LYMPHS ABS: 6.9 10*3/uL — AB (ref 0.7–4.0)
LYMPHS PCT: 38 %
MCH: 33.6 pg (ref 26.0–34.0)
MCHC: 34.3 g/dL (ref 30.0–36.0)
MCV: 98.2 fL (ref 78.0–100.0)
MONOS PCT: 10 %
Metamyelocytes Relative: 0 %
Monocytes Absolute: 1.8 10*3/uL — ABNORMAL HIGH (ref 0.1–1.0)
Myelocytes: 0 %
NEUTROS ABS: 9.4 10*3/uL — AB (ref 1.7–7.7)
Neutrophils Relative %: 52 %
Platelets: 62 10*3/uL — ABNORMAL LOW (ref 150–400)
Promyelocytes Absolute: 0 %
RBC: 2.17 MIL/uL — AB (ref 3.87–5.11)
RDW: 19 % — AB (ref 11.5–15.5)
WBC: 18.1 10*3/uL — AB (ref 4.0–10.5)
nRBC: 0 /100 WBC

## 2017-09-01 LAB — PHOSPHORUS: Phosphorus: 3.5 mg/dL (ref 2.5–4.6)

## 2017-09-01 LAB — TSH: TSH: 4.005 u[IU]/mL (ref 0.350–4.500)

## 2017-09-01 MED ORDER — BOOST / RESOURCE BREEZE PO LIQD
1.0000 | Freq: Three times a day (TID) | ORAL | Status: DC
  Administered 2017-09-01 – 2017-09-08 (×14): 1 via ORAL

## 2017-09-01 MED ORDER — FUROSEMIDE 10 MG/ML IJ SOLN
20.0000 mg | Freq: Once | INTRAMUSCULAR | Status: AC
  Administered 2017-09-01: 20 mg via INTRAVENOUS
  Filled 2017-09-01: qty 2

## 2017-09-01 MED ORDER — SODIUM CHLORIDE 0.9 % IV SOLN
Freq: Once | INTRAVENOUS | Status: AC
Start: 2017-09-01 — End: 2017-09-01
  Administered 2017-09-01: 11:00:00 via INTRAVENOUS

## 2017-09-01 NOTE — Progress Notes (Signed)
Progress Note  Patient Name: Kristina Torres Date of Encounter: 09/01/2017   Subjective   No cardiopulmonary complaints  Inpatient Medications    Scheduled Meds: . allopurinol  300 mg Oral Daily  . dextromethorphan-guaiFENesin  1 tablet Oral BID  . furosemide  20 mg Intravenous Q6H  . furosemide  20 mg Intravenous Once  . pantoprazole  40 mg Oral Daily  . phosphorus  500 mg Oral TID  . polyethylene glycol  17 g Oral Daily  . senna  1 tablet Oral QHS  . simvastatin  20 mg Oral Daily   Continuous Infusions: . sodium chloride    . sodium chloride     PRN Meds: acetaminophen **OR** acetaminophen, alum & mag hydroxide-simeth, guaiFENesin-dextromethorphan, ondansetron, traMADol   Vital Signs    Vitals:   08/31/17 1733 08/31/17 2300 09/01/17 0523 09/01/17 0807  BP: (!) 92/50 (!) 91/46 (!) 98/46 (!) 89/36  Pulse: (!) 113 91 87 92  Resp: _0 Temp: 98.6 F (37 C) 98.7 F (37.1 C) 98.2 F (36.8 C) 98.6 F (37 C)  TempSrc: Oral Oral Oral Oral  SpO2: 95% 91% 90% 95%  Weight:  214 lb 2.1 oz (97.1 kg)    Height:        Intake/Output Summary (Last 24 hours) at 09/01/17 1027 Last data filed at 09/01/17 0600  Gross per 24 hour  Intake              240 ml  Output             1950 ml  Net            -1710 ml   Filed Weights   08/30/17 2200 08/31/17 0500 08/31/17 2300  Weight: 214 lb 3.2 oz (97.2 kg) 214 lb 4.6 oz (97.2 kg) 214 lb 2.1 oz (97.1 kg)    Telemetry    N/A  ECG     Physical Exam   GEN: No acute distress.   Neck: No JVD Cardiac: RRR, no murmurs, rubs, or gallops.  Respiratory: Clear to auscultation bilaterally. GI: Soft, nontender, non-distended  MS: 2+ bilateral LE edema Neuro:  Nonfocal  Psych: Normal affect   Labs    Chemistry Recent Labs Lab 08/30/17 0316 08/31/17 0546 09/01/17 0329  NA 129* 130* 132*  K 3.7 4.2 3.8  CL 100* 101 102  CO2 23 21* 24  GLUCOSE 94 98 84  BUN 21* 15 20  CREATININE 1.02* 1.11* 1.35*  CALCIUM  7.1* 6.9* 6.5*  PROT 3.6* 3.4* 3.4*  ALBUMIN 1.7* 1.6* 1.6*  AST 50* 55* 54*  ALT 39 39 37  ALKPHOS 173* 159* 138*  BILITOT 2.3* 2.4* 2.2*  GFRNONAA 54* 49* 38*  GFRAA >60 57* 45*  ANIONGAP _1 Hematology Recent Labs Lab 08/30/17 0316 08/30/17 1447 08/31/17 1316 09/01/17 0329  WBC 7.1  --  13.2* 18.1*  RBC 2.54* 2.45* 2.61* 2.17*  HGB 8.2*  --  8.6* 7.3*  HCT 24.7*  --  25.6* 21.3*  MCV 97.2  --  98.1 98.2  MCH 32.3  --  33.0 33.6  MCHC 33.2  --  33.6 34.3  RDW 18.8*  --  18.6* 19.0*  PLT 76* 74* 69* 62*    Cardiac EnzymesNo results for input(s): TROPONINI in the last 168 hours. No results for input(s): TROPIPOC in the last 168 hours.   BNP Recent Labs Lab 08/31/17 1430  BNP 21.1  DDimer  Recent Labs Lab 08/30/17 1447  DDIMER 1.28*     Radiology    Ct Soft Tissue Neck Wo Contrast  Result Date: 08/31/2017 CLINICAL DATA:  Recent retroperitoneal and supraclavicular lymph node biopsies concerning for lymphoma. Evaluation for cervical lymphadenopathy. EXAM: CT NECK WITHOUT CONTRAST TECHNIQUE: Multidetector CT imaging of the neck was performed following the standard protocol without intravenous contrast. COMPARISON:  Neck CT 12/13/2010.  Chest CT 08/23/2017. FINDINGS: Pharynx and larynx: Symmetric nasopharyngeal and palatine tonsillar soft tissues. 10 x 10 mm nodular low-density focus in the left paraglottic space appears to be associated with an internal laryngocele. A small amount of asymmetric soft tissue and gas were present in this location on the prior neck CT. Salivary glands: No inflammation, mass, or stone. Thyroid: Unremarkable. Lymph nodes: Multiple enlarged left supraclavicular lymph nodes as were partially seen on the recent CT, the largest measuring 16 mm in short axis. No enlarged lymph nodes more superiorly in the left neck. No right-sided cervical lymph node enlargement. Vascular: Mild vascular calcification. Limited intracranial: Unremarkable.  Visualized orbits: Unremarkable. Mastoids and visualized paranasal sinuses: Polypoid mucosal thickening or mucous retention cysts inferiorly in the left maxillary sinus. Clear mastoid air cells. Skeleton: No suspicious osseous lesion. Moderate diffuse cervical facet arthrosis. Moderate lower cervical disc degeneration. Upper chest: Partially visualized small left pleural effusion. Emphysema. Other: None. IMPRESSION: 1. Multiple enlarged left supraclavicular lymph nodes as previously seen. 2. The no evidence of lymphadenopathy in the upper left neck or right neck. 3. 10 mm low-density left paraglottic focus, favored to reflect a partially fluid-filled internal laryngocele although a small neoplasm is difficult to exclude on this unenhanced study. 4. Small left pleural effusion. 5.  Emphysema (ICD10-J43.9). Electronically Signed   By: Logan Bores M.D.   On: 08/31/2017 10:10   Ct Bone Marrow Biopsy & Aspiration  Result Date: 08/31/2017 CLINICAL DATA:  Lymph node biopsy has demonstrated high-grade B-cell lymphoproliferative disorder. Bone marrow biopsy required for further workup. EXAM: CT GUIDED BONE MARROW ASPIRATION AND BIOPSY ANESTHESIA/SEDATION: Versed 1.0 mg IV, Fentanyl 50 mcg IV Total Moderate Sedation Time:  18 minutes. The patient's level of consciousness and physiologic status were continuously monitored during the procedure by Radiology nursing. PROCEDURE: The procedure risks, benefits, and alternatives were explained to the patient. Questions regarding the procedure were encouraged and answered. The patient understands and consents to the procedure. A time out was performed prior to initiating the procedure. The left gluteal region was prepped with chlorhexidine. Sterile gown and sterile gloves were used for the procedure. Local anesthesia was provided with 1% Lidocaine. Under CT guidance, an 11 gauge On Control bone cutting needle was advanced from a posterior approach into the left iliac bone. Needle  positioning was confirmed with CT. Initial non heparinized and heparinized aspirate samples were obtained of bone marrow. Core biopsy was performed via the On Control drill needle. COMPLICATIONS: None FINDINGS: Inspection of initial aspirate did reveal visible particles. Intact core biopsy sample was obtained. IMPRESSION: CT guided bone marrow biopsy of left posterior iliac bone with both aspirate and core samples obtained. Electronically Signed   By: Aletta Edouard M.D.   On: 08/31/2017 12:52   Ir Nephrostomy Exchange Right  Result Date: 08/31/2017 INDICATION: Status post right percutaneous nephrostomy tube placement on 08/21/2017 to treat hydronephrosis secondary to ureteral obstruction. There has been some poor output from the nephrostomy tube as well as leakage of urine at the tube exit site. EXAM: RIGHT PERCUTANEOUS NEPHROSTOMY TUBE EXCHANGE WITH NEPHROSTOGRAM COMPARISON:  08/21/2017 MEDICATIONS: None ANESTHESIA/SEDATION: None CONTRAST:  15 mL Isovue-300 - administered into the collecting system(s) FLUOROSCOPY TIME:  Fluoroscopy Time: 1 minute and 6 seconds. 3.6 mGy. COMPLICATIONS: None immediate. PROCEDURE: Informed written consent was obtained from the patient after a thorough discussion of the procedural risks, benefits and alternatives. All questions were addressed. Maximal Sterile Barrier Technique was utilized including caps, mask, sterile gowns, sterile gloves, sterile drape, hand hygiene and skin antiseptic. A timeout was performed prior to the initiation of the procedure. The pre-existing nephrostomy tube was injected with contrast material and fluoroscopic spot images obtained to image the collecting system and ureter. The catheter was cut and removed over a guidewire. A new 10 French nephrostomy tube was then advanced over the wire and formed. The catheter was flushed and connected to a new gravity bag. It was secured at the skin with a Prolene retention suture and StatLock device. FINDINGS:  Nephrostogram shows moderate hydronephrosis as well as a dilated ureter. There is a high-grade obstruction of the distal right ureter in the upper pelvis at the level of the upper sacrum. The new nephrostomy tube was formed at the level of the renal pelvis. IMPRESSION: Exchange of right percutaneous nephrostomy tube for new catheter over a guidewire. Nephrostogram demonstrates high-grade right ureteral obstruction in the upper pelvis at the level of the upper sacrum. Electronically Signed   By: Aletta Edouard M.D.   On: 08/31/2017 12:57    Cardiac Studies    Patient Profile    Kristina Torres is a 71 y.o. female with a hx of HTN, HLD and RA on Methotrexate  who is being seen today for the evaluation of LE edema at the request of Dr. Alfredia Ferguson.   No prior hx of CAD, CHF or stroke. PCP has prescribed PRN lasix of LE edema. Required x 2 in past one year.   Assessment & Plan    1. Bilateral LE edema - 08/2017 echo LVEF 60-65%, no WMAs, grade I diastolic dysfunction. Normal RV. PASP 36 - from notes weight 181 lbs in early Sept, 214 lbs on admission. Presented with evidence of severe volume overload - Degree of edema somewhat out of proportion to his mild diastolic dysfunction on echo. Very low albunim. TSH is pending. LE [place] pending. Abnormal liver tests, defer consideration for possible liver disease/cirrhosis to primary team. No protein detected in UA, does not appear to be nephrotic syndrome.  - will review echo to reassess RV function - negative 1.7 liters yesterday, negative 925m since admission. Uptrend in Cr and BUN. Currentl on lasix IV 241mevery 6 hrs. Continue IV diuretics today, if Cr continus to trend up then will need to d/c, I have written for her last dose tonight and to be reasssed tomorrow before additional dosing - previous CXR pneumonia vs CHF. BNP only 21  Overall clinical findings are not overally consistent with LV diastolic HF. F/u LE [place] further consideration for  noncardiogenic edema to primary team. Review echo to reassess RV function as RV failure would make more sense based on overall clinical situation.   For questions or updates, please contact [facility] consult [email or web address] for contact info under Cardiology/STEMI.      SiMerrily PewMD  09/01/2017, 10:27 AM

## 2017-09-01 NOTE — Progress Notes (Signed)
Patient ID: Kristina Torres, female   DOB: 1946/06/21, 71 y.o.   MRN: [id number]    Assessment: Right ureteral obstruction: I discussed with the patient and her husband the series of events that have unfolded and my impression as well as recommendations. She was seen in my office with new right hydronephrosis. At that time I had recommended cystoscopy with left retrograde pyelogram and possible ureteroscopy in order to determine the cause of her obstruction. She was subsequently admitted to the hospital and underwent a right percutaneous nephrostomy tube placement. In addition she has been found to have what appears to be a lymphoproliferative disorder with significant abdominal adenopathy. At the time of nephrostomy tube change she underwent an antegrade nephrostogram.This study revealed that the site of her obstruction is in the mid ureteral region at the pelvic brim with the CT scan revealing a tapering, normal ureter distal to this. There was no "goblet sign"to suggest an intraluminal cause of her obstruction however this study revealed a tapering ureter  consistent with extrinsic compression. In addition her urine was noted be free of any red cells which would also be consistent with extrinsic compression rather than any intrinsic ureteral lesion. We then discussed The fact that her ureteral obstruction can be managed over the longer term with internalization of her nephrostomy tube by the placement of a double-J stent in an anterograde fashion by interventional radiology with removal of her nephrostomy tube. I can then manage her stent changes as an outpatient with the eventual hope that the obstruction will respond to chemotherapy and her stent can be eventually removed.  Plan:  1. With clear urine, an apparent extrinsic cause of obstruction of her right ureter and her kidney now drained percutaneously re is no indication for cystoscopy for further visualization of her ureter at this time. 2. I will  ask interventional radiology if they could internalize her nephrostomy tube with a double-J stent during her hospitalization.  Subjective: Patient is without new complaint.  Objective: Vital signs in last 24 hours: Temp:  [98.2 F (36.8 C)-98.7 F (37.1 C)] 98.2 F (36.8 C) (09/15 0523) Pulse Rate:  [86-113] 87 (09/15 0523) Resp:  [18-28] 18 (09/15 0523) BP: (91-134)/(44-66) 98/46 (09/15 0523) SpO2:  [90 %-100 %] 90 % (09/15 0523) Weight:  [97.1 kg (214 lb 2.1 oz)] 97.1 kg (214 lb 2.1 oz) (09/14 2300)  Intake/Output from previous day: 09/14 0701 - 09/15 0700 In: 240 [P.O.:240] Out: 1950 [Urine:1950] Intake/Output this shift: No intake/output data recorded.  Past Medical History:  Diagnosis Date  . Essential hypertension   . Gout   . HLD (hyperlipidemia)   . Rheumatoid arthritis ([place])    Current Facility-Administered Medications  Medication Dose Route Frequency Provider Last Rate Last Dose  . 0.9 %  sodium chloride infusion   Intravenous Once Vernell Leep D, MD      . acetaminophen (TYLENOL) tablet 650 mg  650 mg Oral Q6H PRN Ivor Costa, MD   650 mg at 08/31/17 0801   Or  . acetaminophen (TYLENOL) suppository 650 mg  650 mg Rectal Q6H PRN Ivor Costa, MD      . allopurinol (ZYLOPRIM) tablet 300 mg  300 mg Oral Daily Ivor Costa, MD   300 mg at 08/31/17 1150  . alum & mag hydroxide-simeth (MAALOX/MYLANTA) 200-200-20 MG/5ML suspension 15 mL  15 mL Oral Q4H PRN Modena Jansky, MD   15 mL at 08/25/17 0755  . dextromethorphan-guaiFENesin (MUCINEX DM) 30-600 MG per 12 hr tablet 1 tablet  1 tablet Oral BID Ivor Costa, MD   1 tablet at 08/31/17 1150  . furosemide (LASIX) injection 20 mg  20 mg Intravenous Q6H Dorothy Spark, MD   20 mg at 09/01/17 0406  . guaiFENesin-dextromethorphan (ROBITUSSIN DM) 100-10 MG/5ML syrup 5 mL  5 mL Oral Q4H PRN Hongalgi, Anand D, MD      . ondansetron (ZOFRAN) injection 4 mg  4 mg Intravenous Q8H PRN Ivor Costa, MD   4 mg at 08/24/17 1438  .  pantoprazole (PROTONIX) EC tablet 40 mg  40 mg Oral Daily Modena Jansky, MD   40 mg at 08/31/17 1150  . phosphorus (K PHOS NEUTRAL) tablet 500 mg  500 mg Oral TID Raiford Noble [place], DO   500 mg at 08/31/17 2252  . polyethylene glycol (MIRALAX / GLYCOLAX) packet 17 g  17 g Oral Daily Modena Jansky, MD   17 g at 08/30/17 1125  . senna (SENOKOT) tablet 8.6 mg  1 tablet Oral QHS Hongalgi, Anand D, MD      . simvastatin (ZOCOR) tablet 20 mg  20 mg Oral Daily Ivor Costa, MD   20 mg at 08/31/17 1150  . traMADol (ULTRAM) tablet 50 mg  50 mg Oral Q6H PRN Raiford Noble [place], DO   50 mg at 09/01/17 1696    Physical Exam:  General: Patient is in no apparent distress Lungs: Normal respiratory effort, chest expands symmetrically. GI: The abdomen is soft and nontender.    Lab Results:  Recent Labs  08/30/17 0316 08/31/17 1316 09/01/17 0329  WBC 7.1 13.2* 18.1*  HGB 8.2* 8.6* 7.3*  HCT 24.7* 25.6* 21.3*   BMET  Recent Labs  08/31/17 0546 09/01/17 0329  NA 130* 132*  K 4.2 3.8  CL 101 102  CO2 21* 24  GLUCOSE 98 84  BUN 15 20  CREATININE 1.11* 1.35*  CALCIUM 6.9* 6.5*    Recent Labs  08/30/17 1447  INR 1.23   No results for input(s): LABURIN in the last 72 hours. Results for orders placed or performed during the hospital encounter of 08/20/17  Blood Culture (routine x 2)     Status: None   Collection Time: 08/20/17  8:10 PM  Result Value Ref Range Status   Specimen Description BLOOD RIGHT HAND  Final   Special Requests IN PEDIATRIC BOTTLE Blood Culture adequate volume  Final   Culture NO GROWTH 5 DAYS  Final   Report Status 08/25/2017 FINAL  Final  Blood Culture (routine x 2)     Status: None   Collection Time: 08/20/17  8:13 PM  Result Value Ref Range Status   Specimen Description BLOOD RIGHT ANTECUBITAL  Final   Special Requests   Final    BOTTLES DRAWN AEROBIC AND ANAEROBIC Blood Culture adequate volume   Culture NO GROWTH 5 DAYS  Final   Report Status  08/25/2017 FINAL  Final  Urine Culture     Status: None   Collection Time: 08/20/17  9:15 PM  Result Value Ref Range Status   Specimen Description URINE, CATHETERIZED  Final   Special Requests NONE  Final   Culture NO GROWTH  Final   Report Status 08/22/2017 FINAL  Final  Urine Culture     Status: None   Collection Time: 08/21/17  2:32 PM  Result Value Ref Range Status   Specimen Description URINE, CATHETERIZED  URINE FROM DRAIN PLACEMENT  Final   Special Requests NONE  Final   Culture NO GROWTH  Final   Report Status 08/22/2017 FINAL  Final  Culture, blood (Routine X 2) w Reflex to ID Panel     Status: None   Collection Time: 08/24/17  6:13 PM  Result Value Ref Range Status   Specimen Description BLOOD LEFT HAND  Final   Special Requests   Final    IN PEDIATRIC BOTTLE Blood Culture results may not be optimal due to an excessive volume of blood received in culture bottles   Culture NO GROWTH 5 DAYS  Final   Report Status 08/29/2017 FINAL  Final  Culture, blood (Routine X 2) w Reflex to ID Panel     Status: None   Collection Time: 08/24/17  6:14 PM  Result Value Ref Range Status   Specimen Description BLOOD RIGHT HAND  Final   Special Requests   Final    BOTTLES DRAWN AEROBIC AND ANAEROBIC Blood Culture adequate volume   Culture NO GROWTH 5 DAYS  Final   Report Status 08/29/2017 FINAL  Final  Urine Culture     Status: None   Collection Time: 08/24/17 11:01 PM  Result Value Ref Range Status   Specimen Description URINE, CLEAN CATCH  Final   Special Requests NONE  Final   Culture NO GROWTH  Final   Report Status 08/26/2017 FINAL  Final    Studies/Results: Ct Soft Tissue Neck Wo Contrast  Result Date: 08/31/2017 CLINICAL DATA:  Recent retroperitoneal and supraclavicular lymph node biopsies concerning for lymphoma. Evaluation for cervical lymphadenopathy. EXAM: CT NECK WITHOUT CONTRAST TECHNIQUE: Multidetector CT imaging of the neck was performed following the standard protocol  without intravenous contrast. COMPARISON:  Neck CT 12/13/2010.  Chest CT 08/23/2017. FINDINGS: Pharynx and larynx: Symmetric nasopharyngeal and palatine tonsillar soft tissues. 10 x 10 mm nodular low-density focus in the left paraglottic space appears to be associated with an internal laryngocele. A small amount of asymmetric soft tissue and gas were present in this location on the prior neck CT. Salivary glands: No inflammation, mass, or stone. Thyroid: Unremarkable. Lymph nodes: Multiple enlarged left supraclavicular lymph nodes as were partially seen on the recent CT, the largest measuring 16 mm in short axis. No enlarged lymph nodes more superiorly in the left neck. No right-sided cervical lymph node enlargement. Vascular: Mild vascular calcification. Limited intracranial: Unremarkable. Visualized orbits: Unremarkable. Mastoids and visualized paranasal sinuses: Polypoid mucosal thickening or mucous retention cysts inferiorly in the left maxillary sinus. Clear mastoid air cells. Skeleton: No suspicious osseous lesion. Moderate diffuse cervical facet arthrosis. Moderate lower cervical disc degeneration. Upper chest: Partially visualized small left pleural effusion. Emphysema. Other: None. IMPRESSION: 1. Multiple enlarged left supraclavicular lymph nodes as previously seen. 2. The no evidence of lymphadenopathy in the upper left neck or right neck. 3. 10 mm low-density left paraglottic focus, favored to reflect a partially fluid-filled internal laryngocele although a small neoplasm is difficult to exclude on this unenhanced study. 4. Small left pleural effusion. 5.  Emphysema (ICD10-J43.9). Electronically Signed   By: Logan Bores M.D.   On: 08/31/2017 10:10   Ct Bone Marrow Biopsy & Aspiration  Result Date: 08/31/2017 CLINICAL DATA:  Lymph node biopsy has demonstrated high-grade B-cell lymphoproliferative disorder. Bone marrow biopsy required for further workup. EXAM: CT GUIDED BONE MARROW ASPIRATION AND  BIOPSY ANESTHESIA/SEDATION: Versed 1.0 mg IV, Fentanyl 50 mcg IV Total Moderate Sedation Time:  18 minutes. The patient's level of consciousness and physiologic status were continuously monitored during the procedure by Radiology nursing. PROCEDURE: The procedure risks, benefits, and alternatives  were explained to the patient. Questions regarding the procedure were encouraged and answered. The patient understands and consents to the procedure. A time out was performed prior to initiating the procedure. The left gluteal region was prepped with chlorhexidine. Sterile gown and sterile gloves were used for the procedure. Local anesthesia was provided with 1% Lidocaine. Under CT guidance, an 11 gauge On Control bone cutting needle was advanced from a posterior approach into the left iliac bone. Needle positioning was confirmed with CT. Initial non heparinized and heparinized aspirate samples were obtained of bone marrow. Core biopsy was performed via the On Control drill needle. COMPLICATIONS: None FINDINGS: Inspection of initial aspirate did reveal visible particles. Intact core biopsy sample was obtained. IMPRESSION: CT guided bone marrow biopsy of left posterior iliac bone with both aspirate and core samples obtained. Electronically Signed   By: Aletta Edouard M.D.   On: 08/31/2017 12:52   Ir Nephrostomy Exchange Right  Result Date: 08/31/2017 INDICATION: Status post right percutaneous nephrostomy tube placement on 08/21/2017 to treat hydronephrosis secondary to ureteral obstruction. There has been some poor output from the nephrostomy tube as well as leakage of urine at the tube exit site. EXAM: RIGHT PERCUTANEOUS NEPHROSTOMY TUBE EXCHANGE WITH NEPHROSTOGRAM COMPARISON:  08/21/2017 MEDICATIONS: None ANESTHESIA/SEDATION: None CONTRAST:  15 mL Isovue-300 - administered into the collecting system(s) FLUOROSCOPY TIME:  Fluoroscopy Time: 1 minute and 6 seconds. 3.6 mGy. COMPLICATIONS: None immediate. PROCEDURE:  Informed written consent was obtained from the patient after a thorough discussion of the procedural risks, benefits and alternatives. All questions were addressed. Maximal Sterile Barrier Technique was utilized including caps, mask, sterile gowns, sterile gloves, sterile drape, hand hygiene and skin antiseptic. A timeout was performed prior to the initiation of the procedure. The pre-existing nephrostomy tube was injected with contrast material and fluoroscopic spot images obtained to image the collecting system and ureter. The catheter was cut and removed over a guidewire. A new 10 French nephrostomy tube was then advanced over the wire and formed. The catheter was flushed and connected to a new gravity bag. It was secured at the skin with a Prolene retention suture and StatLock device. FINDINGS: Nephrostogram shows moderate hydronephrosis as well as a dilated ureter. There is a high-grade obstruction of the distal right ureter in the upper pelvis at the level of the upper sacrum. The new nephrostomy tube was formed at the level of the renal pelvis. IMPRESSION: Exchange of right percutaneous nephrostomy tube for new catheter over a guidewire. Nephrostogram demonstrates high-grade right ureteral obstruction in the upper pelvis at the level of the upper sacrum. Electronically Signed   By: Aletta Edouard M.D.   On: 08/31/2017 12:57   CT scan and nephrostogram images were independently reviewed    Flavio Lindroth C 09/01/2017, 7:10 AM

## 2017-09-01 NOTE — Progress Notes (Signed)
PROGRESS NOTE    Kristina Torres  TKZ:[id number] DOB: 06-02-1946 DOA: 08/20/2017 PCP: [name], MD   Brief Narrative:  Kristina Torres is a 71 y.o. female, PMH of HTN, HLD, gout, RA on MTX and renal mass presented to ED on 08/20/17 with complaints of nausea, nonbloody emesis, RUQ abdominal pain, lightheadedness, generalized weakness and cough. She was seen at [facility] on Tuesday prior to admission and found to have "right renal mass on ultrasound with blockage", started on oral ciprofloxacin for UTI and was supposed to see Urologist on Thursday with a CT scan. In the ED, transiently hypotensive, noted to have acute kidney injury with creatinine of 2.08, hypercalcemia with calcium of 13.87, oxygen saturation 90% on room air and CT abdomen demonstrated right hepatic lobe with hypodense mass, moderate retroperitoneal bulky lymphadenopathy, splenomegaly, severe right hydronephrosis and hydroureter. Urology consulted. IR placed right percutaneous nephrostomy. Oncology consulted and underwent retroperitoneal lymph node biopsy 9/6, preliminary pathology shows malignancy, suspicious for lymphoma but inadequate sample and hence oncology had IR do additional biopsy on 9/7-results showed ? Highgrade Lymphoma. Hypercalcemia resolved. Mental status slowly improving. Patient still feels weak.   Patient stated that Nephrostomy tube stopped working so IR was consulted and ordered a Nephrostogram and it was replaced 9/14. Dr. Burr Medico of Oncology re-evaluated and because cannot definitively call LNBiopsy High grade Lymphoma, FISH panel was ordered and Surgical Biopsy would be necessary for definitive Diagnosis. CT of Neck to evaluate adenopathy ordered by Heme/Onc and Dr. Burr Medico spoke with Dr. Constance Holster who will do surgical biopsy of Left Supraclavicular LN. Patient underwent Bone Marrow Biopsy 9/14 and Dr. Burr Medico spoke with Dr. Gari Crown of Pathology who states the flow and Core Biopsy will be Monday.  Cardiology  evaluated and recommending IV Lasix 20 mg q6h, addressing Nutritional Status, and Checking LE [place] for DVT's. Dr. Burr Medico spoke with Dr. Alyson Ingles of Urology and patient to undergo cystoscopy and ureter stenting the same day as LN Biopsy. Urology Dr. Karsten Ro evaluated patient 9/15 and recommended there was no indication for cystoscopy and recommended IR internalize Nephrostomy Tube with Double-J stenting during Hospitalization and will be done on Monday 09/03/17.   On 9/15 Patient's U/S of LE showed no DVT and Cardiology recommended continuing lasix with close eye on urine. Patient's Hb/Hct dropped so she was transfused 1 more unit of pRBC's. Leukocytosis rose again so she was pan-cultured to r/o another infection. Nutritionist consulted and recommended Boost Breeze TID.  Assessment & Plan:   Principal Problem:   Nausea & vomiting Active Problems:   Rheumatoid arthritis ([place])   Essential hypertension   Gout   UTI (urinary tract infection)   Sepsis (HCC)   HLD (hyperlipidemia)   Hypotension   AKI (acute kidney injury) ([place])   Hypercalcemia   Thrombocytopenia (HCC)   Cough   Macrocytic anemia   Lymphadenopathy   Hypophosphatemia   Hypomagnesemia   Hyponatremia   Acute urinary retention   Hyperbilirubinemia  Severe right hydronephrosis and hydroureter:  -Urology consulted 9/4 and recommended nephrostomy tube placement by IR which was done same day. Urine cultures negative.  - Discussed with consulting Urologist on 9/7, recommends DC with percutaneous nephrostomy until outpatient follow-up with Dr. Karsten Ro regarding further evaluation including cystoscopy etc. He indicated that bladder mass could well be lymphoma. -Discussed with Urology for Inpatient Cystoscopy and possible Biopsy; Discussed with Dr. Alyson Ingles and he states cystoscopy is not possible in the hospital however after Dr. Ernestina Penna discussion patient to undergo cystoscopy and ureter stenting on the  same day of LN Biopsy -Right PCN  stopped draining so IR re-consulted and Patient to underwent Nephrostogram Nephrostomy Tube Exchange 9/14 -Urology Dr. Karsten Ro evaluated and recommends no Cystoscopy but internalizing Nephrostomy tube with Double J Stenting and will be done by IR on Monday 09/03/17  Acute Urinary Retention -Foley Catheter Placed as Bladder Scan showed >511 mL of Urine yesterady -Will need follow up with Urology as an outpatient and will need to follow up with Dr. Karsten Ro as an outpatient.    Nausea and vomiting:  - Secondary to obstructive uropathy and acute kidney injury. - Resolved.   Hypercalcemia, improved -May be secondary to underlying malignancy/lymphoma complicated by use of Tums, HCTZ, calcium and vitamin D supplements at home which have been held.  -Corrected calcium was as high as 14.5 on 9/6.  -Treated with IV normal saline, IV Lasix, a dose of pamidronate 60 mg on 6/5, IM calcitonin 4 doses. Vitamin D, 25-hydroxy: 30.2. PTH related peptide: <2.0  PTH: 16.  - Resolved.  Anemia:  -As per oncology, likely related to methotrexate and anemia of chronic disease.  -Anemia panel: Iron 29, TIBC 214, saturation 14, ferritin 745, folate 9.6, B12: 767.  - As per oncology, transfuse to maintain hgb >'8mg'$ /dL. -Transfused 1u PRBC 9/10. This seemed to improve mentation considerably. - CBC showed Hb/Hct of 7.3/21.3 so will be Transufsed another Unit - Hold methotrexate per oncology - Patient to undergo Bone Marrow Biopsy in AM   Thrombocytopenia:  -Worsening. Platelet Count went from 100 -> 80 -> 63 -> 74 -> 69 -> 62 -Stable without bleeding. As per oncology, likely related to methotrexate or autoimmune mediated. -Will need to rule out HIT so will send Ab -Bone Marrow Biopsy done 08/31/17 with Flow and Core Biopsy done Monday  -DIC Panel ordered by Hematology and showed Elevated D-Dimer at 1.28 and Fibrinogen Level  -Continue to Monitor and discussed with Oncology and appreciate Recc's -[id number] ordered  to r/o TTP was 44.3% -Continue to Follow Onc Recc's and repeat CBC in AM  Hypokalemia:  -Improved; K+ was 3.8 -Continue to Monitor and Replete as Necessary -Repeat CMP in AM    Elevated lactate:  -Likely due to dehydration and acute kidney injury. Improved.  AKI:  -SCr 2.08 on admission up from baseline of 0.9 - 1.0 due to obstructive uropathy.  - Resolved with PCN. - BUN/Cr now went from 15/1.11 -> 20/1.35 -Continue to Monitor while on Lasix  Acute hypoxic respiratory failure:  - Due to pneumonia as below. - Resolved with treatments as below  HCAP, Lobar Pneumonia (left mid and lower lung):  -CT chest shows mild posterior lower lobe bronchiectasis with associated consolidation. Developed fever of 101F on 9/7. Cultures drawn and initiated IV vancomycin and Zosyn.  -Pro-calcitonin 1.48 -> 1.67 -> 2.03 ->1.61 . Repeat blood cultures from 9/7: Negative to date.  -Chest x-ray 9/8 confirmed pneumonia.  - Since cultures continue to be negative, DC'ed vancomycin. Continues to improve, so will transition zosyn > po Levaquin which has now D/Cd - Afebrile. WBC increased to 18.1 -CXR 9/13 showed Decreased pulmonary interstitial edema or infiltrates bilaterally may reflect improving CHF or less likely interstitial pneumonia. Persistent atelectasis or scarring at the left base. Thoracic aortic atherosclerosis  Essential Hypertension:  -HCTZ held. Blood pressure is currently soft off antihypertensives.  Hyperlipidemia:  - Chronic, stable - Continue statin  Liver mass:  -CT abdomen showed vague hypodense mass with central calcification in the right hepatic lobe presumably corresponding to the MRI mass  from 2011 which was thought to represent hemangioma.  -CT also showed moderate retroperitoneal somewhat bulky lymphadenopathy and splenomegaly raising concerns for metastatic disease or lymphoproliferative abnormality such as lymphoma.  -Oncology consulted 9/4 and indicate that the  liver lesion is not suspicious for malignancy.  -As per oncology, based on initial RP lymph node biopsy, lymphoma is suspected but inadequate tissue sample and findings could also be due to lymphoproliferative disorder secondary to MTX or RA. Second biopsy off left [place] node done on 9/7, results showed ? Highgrade Lymphoma.  - Dr. Burr Medico is coordinating with pathology regarding results and will follow-up patient after discussion with Pathologist   -Could Account for mildly elevated AST  Rheumatoid arthritis: Chronic, stable.  - Stopping methotrexate - Will need rheumatology follow up  Urethral/bladder mass:  - As per urology consultation, outpatient cystoscopy no longer necessary -?Lymphoma. -As Above  Acute Metabolic Encephalopathy, improved  -Likely related to acute kidney injury and hypercalcemia (likely the main cause) complicating underlying possible mild dementia. No focal deficits. CT head without acute findings.  - Avoid sedating medications. Encouraged sleep hygiene.  - Consider neurocognitive testing as outpatient  Fall:  -Sustained a few days ago in her hospital room. No injury sustained.  - As per PT, SNF at time of discharge.  Nutrition:  - Poor oral intake as per family.  - Dietitian consulted. And recommended Boost Breeze TID - Encouraged oral intake. May be slowly increasing.  Hypophosphatemia -Patient's Phos Level improved to 3.5 -Continue to Monitor and Replete Mag as Necessary -Repeat Phos Level in AM   Hypomagnesemia -Patient's Mag Level was 1.7  -Continue to Monitor and Replete Mag as Necessary -Repeat Mag Level in AM   Hyponatremia -Sodium went from 142 -> 131 -> 129 -> 130 -> 132 -Suspect Hypervolemia Hyponatremia; Getting IV Lasix 20 mg q6h per Cards -Continue to Monitor;  -Repeat CMP in AM   Hyperbilirubinemia -T Bili was 2.4 and went to 2.2 -DIC Panel being ordered by Oncology and as above -Continue to Monitor and Repeat LFT's in AM    Bilateral LE Edema suspect Acute Diastolic CHF Exacerbation vs Poor Nutritional Status r/o DVT -ECHO ordered and showed EF of 60-65% with G1DD -Strict I's/O's; Patient is --0.582 Liters -Daily Weights; Patient is +37 lbs -Fluid Restrict to 1500 mL -Consulted with Cardiology and recommending IV Lasix 20 mg q6h for another day -LE Dopplers Checked and Negative for DVT  Adenopathy -Suspicious for Lymphoma -CT Neck ordered by Oncology and showed Multiple enlarged left supraclavicular lymph nodes as previously seen.  The no evidence of lymphadenopathy in the upper left neck or right neck.  10 mm low-density left paraglottic focus, favored to reflect a partially fluid-filled internal laryngocele although a small neoplasm is difficult to exclude on this unenhanced study.  Small left pleural effusion. Emphysema  -ENT Dr. Constance Holster consulted and will perform surgical excision of supraclavicular LN to work up for Lymphoma and will be done on Wednesday of next week.   Hx of Gout -Not in Acute Flare -C/w Allopurinol  Leukocytosis -Worsening; Could be Reactive -Pan Culture and repeat Blood Cx, CXR, and Urinalysis with Urine Cx to r/o Infection -Continue to Monitor and repeat CBC in AM  DVT prophylaxis: SCDs Code Status: FULL CODE Family Communication: Discussed Husband and family at bedside Disposition Plan: Remain Inpatient for further Workup  Consultants:   Urology  Medical Oncology  Interventional Radiology  Cardiology  ENT    Procedures: Right percutaneous nephrostomy Retroperitoneal lymph node biopsy  9/6. Left supraclavicular lymph node biopsy 9/7 Right Percutaneous nephrostomy exchange 9/14 Bone Marrow Biopsy 9/14   Antimicrobials:  Anti-infectives    Start     Dose/Rate Route Frequency Ordered Stop   08/28/17 1815  levofloxacin (LEVAQUIN) tablet 500 mg     500 mg Oral Daily with supper 08/28/17 1804 08/31/17 1744   08/28/17 1700  levofloxacin (LEVAQUIN) 25 MG/ML  solution 500 mg  Status:  Discontinued     500 mg Oral Daily with supper 08/28/17 1432 08/28/17 1804   08/25/17 0800  vancomycin (VANCOCIN) IVPB 750 mg/150 ml premix  Status:  Discontinued     750 mg 150 mL/hr over 60 Minutes Intravenous Every 12 hours 08/24/17 1720 08/27/17 1601   08/24/17 1800  vancomycin (VANCOCIN) 2,000 mg in sodium chloride 0.9 % 500 mL IVPB     2,000 mg 250 mL/hr over 120 Minutes Intravenous  Once 08/24/17 1720 08/24/17 2036   08/24/17 1800  piperacillin-tazobactam (ZOSYN) IVPB 3.375 g  Status:  Discontinued     3.375 g 12.5 mL/hr over 240 Minutes Intravenous Every 8 hours 08/24/17 1720 08/28/17 1432   08/21/17 2000  cefTRIAXone (ROCEPHIN) 1 g in dextrose 5 % 50 mL IVPB  Status:  Discontinued     1 g 100 mL/hr over 30 Minutes Intravenous Every 24 hours 08/20/17 1957 08/22/17 1709   08/21/17 1330  ciprofloxacin (CIPRO) IVPB 400 mg     400 mg 200 mL/hr over 60 Minutes Intravenous To Radiology 08/21/17 1319 08/21/17 1430   08/20/17 1945  cefTRIAXone (ROCEPHIN) 2 g in dextrose 5 % 50 mL IVPB     2 g 100 mL/hr over 30 Minutes Intravenous  Once 08/20/17 1932 08/20/17 2242     Subjective: Seen and examined at bedside and wasn't as SOB. States legs feel sore but feel less swollen. No nausea or vomiting. No other concerns or complaints.   Objective: Vitals:   09/01/17 1118 09/01/17 1146 09/01/17 1341 09/01/17 1836  BP: (!) 91/44 (!) 89/35 (!) 89/52 (!) 89/56  Pulse: 98 94 89 68  Resp: '16 16 16 16  '$ Temp: 98.1 F (36.7 C) 98.7 F (37.1 C) 98.7 F (37.1 C) 97.8 F (36.6 C)  TempSrc: Oral Oral Oral Oral  SpO2: 93% 93% 92% 96%  Weight:      Height:        Intake/Output Summary (Last 24 hours) at 09/01/17 2009 Last data filed at 09/01/17 1850  Gross per 24 hour  Intake             1488 ml  Output             2275 ml  Net             -787 ml   Filed Weights   08/30/17 2200 08/31/17 0500 08/31/17 2300  Weight: 97.2 kg (214 lb 3.2 oz) 97.2 kg (214 lb 4.6 oz)  97.1 kg (214 lb 2.1 oz)   Examination: Physical Exam:  Constitutional: Pleasant obese Caucasian female in NAD Eyes: Sclerae anicteric; Conjunctivae non-injected ENMT: Grossly normal hearing. Mucous Membranes Moist Neck: Supple with no JVD Respiratory: Diminished breath sounds with no appreciable wheezing/rales/rhonchi. Patient was not tachypenic or using any accessory muscles to breathe Cardiovascular: RRR; S1, S2; 2+ LE edema Abdomen: Soft, NT, ND. Bowel sounds present GU: Deferred; Has foley catheter in place along with Right PCN Musculoskeletal: No contractures; No cyanosis Skin: Warm and Dry. No rashes or lesions on a limited skin eval Neurologic: CN 2-12  grossly intact. No appreciable focal deficits Psychiatric: Pleasant mood and affect. Intact judgement and insight  Data Reviewed: I have personally reviewed following labs and imaging studies  CBC:  Recent Labs Lab 08/28/17 0825 08/29/17 0433 08/30/17 0316 08/30/17 1447 08/31/17 1316 09/01/17 0329  WBC 9.3 5.7 7.1  --  13.2* 18.1*  NEUTROABS  --   --  4.5  --  9.2* 9.4*  HGB 8.5* 8.3* 8.2*  --  8.6* 7.3*  HCT 25.8* 25.0* 24.7*  --  25.6* 21.3*  MCV 98.5 97.3 97.2  --  98.1 98.2  PLT 80* 63* 76* 74* 69* 62*   Basic Metabolic Panel:  Recent Labs Lab 08/28/17 0825 08/29/17 0433 08/30/17 0316 08/31/17 0546 09/01/17 0329  NA 132* 131* 129* 130* 132*  K 3.7 3.7 3.7 4.2 3.8  CL 103 103 100* 101 102  CO2 22 19* 23 21* 24  GLUCOSE 101* 83 94 98 84  BUN 30* 24* 21* 15 20  CREATININE 1.01* 0.96 1.02* 1.11* 1.35*  CALCIUM 7.4* 7.2* 7.1* 6.9* 6.5*  MG  --  1.4* 1.7 1.5* 1.7  PHOS  --  1.3* 1.6* 1.8* 3.5   GFR: Estimated Creatinine Clearance: 38.7 mL/min (A) (by C-G formula based on SCr of 1.35 mg/dL (H)). Liver Function Tests:  Recent Labs Lab 08/28/17 0825 08/29/17 0433 08/30/17 0316 08/31/17 0546 09/01/17 0329  AST  --  47* 50* 55* 54*  ALT  --  33 39 39 37  ALKPHOS  --  143* 173* 159* 138*  BILITOT   --  2.2* 2.3* 2.4* 2.2*  PROT  --  3.6* 3.6* 3.4* 3.4*  ALBUMIN 1.8* 1.7* 1.7* 1.6* 1.6*   No results for input(s): LIPASE, AMYLASE in the last 168 hours. No results for input(s): AMMONIA in the last 168 hours. Coagulation Profile:  Recent Labs Lab 08/30/17 1447  INR 1.23   Cardiac Enzymes: No results for input(s): CKTOTAL, CKMB, CKMBINDEX, TROPONINI in the last 168 hours. BNP (last 3 results) No results for input(s): PROBNP in the last 8760 hours. HbA1C: No results for input(s): HGBA1C in the last 72 hours. CBG:  Recent Labs Lab 08/27/17 0748  GLUCAP 103*   Lipid Profile: No results for input(s): CHOL, HDL, LDLCALC, TRIG, CHOLHDL, LDLDIRECT in the last 72 hours. Thyroid Function Tests:  Recent Labs  09/01/17 1608  TSH 4.005   Anemia Panel:  Recent Labs  08/30/17 1447  RETICCTPCT 2.8   Sepsis Labs:  Recent Labs Lab 08/27/17 0432 08/29/17 0433 08/31/17 0546  PROCALCITON 1.67 2.03 1.61    Recent Results (from the past 240 hour(s))  Culture, blood (Routine X 2) w Reflex to ID Panel     Status: None   Collection Time: 08/24/17  6:13 PM  Result Value Ref Range Status   Specimen Description BLOOD LEFT HAND  Final   Special Requests   Final    IN PEDIATRIC BOTTLE Blood Culture results may not be optimal due to an excessive volume of blood received in culture bottles   Culture NO GROWTH 5 DAYS  Final   Report Status 08/29/2017 FINAL  Final  Culture, blood (Routine X 2) w Reflex to ID Panel     Status: None   Collection Time: 08/24/17  6:14 PM  Result Value Ref Range Status   Specimen Description BLOOD RIGHT HAND  Final   Special Requests   Final    BOTTLES DRAWN AEROBIC AND ANAEROBIC Blood Culture adequate volume   Culture NO GROWTH  5 DAYS  Final   Report Status 08/29/2017 FINAL  Final  Urine Culture     Status: None   Collection Time: 08/24/17 11:01 PM  Result Value Ref Range Status   Specimen Description URINE, CLEAN CATCH  Final   Special Requests  NONE  Final   Culture NO GROWTH  Final   Report Status 08/26/2017 FINAL  Final    Radiology Studies: Dg Chest 1 View  Result Date: 09/01/2017 CLINICAL DATA:  Shortness of breath EXAM: CHEST 1 VIEW COMPARISON:  August 30, 2017 FINDINGS: Continued patchy infiltrate in the left base, similar in the interval. No pneumothorax. The cardiomediastinal silhouette is stable. No other interval changes. A nodular density projected over the right upper lung laterally is likely artifactual as no nodule was seen in this region on the recent chest CT. IMPRESSION: 1. Persistent stable patchy infiltrate in the left base. 2. The nodular density over the lateral right apex may be artifactual as it was not seen on the recent CT scan. Recommend attention to this region on follow-up. Electronically Signed   By: Dorise Bullion III M.D   On: 09/01/2017 11:04   Ct Soft Tissue Neck Wo Contrast  Result Date: 08/31/2017 CLINICAL DATA:  Recent retroperitoneal and supraclavicular lymph node biopsies concerning for lymphoma. Evaluation for cervical lymphadenopathy. EXAM: CT NECK WITHOUT CONTRAST TECHNIQUE: Multidetector CT imaging of the neck was performed following the standard protocol without intravenous contrast. COMPARISON:  Neck CT 12/13/2010.  Chest CT 08/23/2017. FINDINGS: Pharynx and larynx: Symmetric nasopharyngeal and palatine tonsillar soft tissues. 10 x 10 mm nodular low-density focus in the left paraglottic space appears to be associated with an internal laryngocele. A small amount of asymmetric soft tissue and gas were present in this location on the prior neck CT. Salivary glands: No inflammation, mass, or stone. Thyroid: Unremarkable. Lymph nodes: Multiple enlarged left supraclavicular lymph nodes as were partially seen on the recent CT, the largest measuring 16 mm in short axis. No enlarged lymph nodes more superiorly in the left neck. No right-sided cervical lymph node enlargement. Vascular: Mild vascular  calcification. Limited intracranial: Unremarkable. Visualized orbits: Unremarkable. Mastoids and visualized paranasal sinuses: Polypoid mucosal thickening or mucous retention cysts inferiorly in the left maxillary sinus. Clear mastoid air cells. Skeleton: No suspicious osseous lesion. Moderate diffuse cervical facet arthrosis. Moderate lower cervical disc degeneration. Upper chest: Partially visualized small left pleural effusion. Emphysema. Other: None. IMPRESSION: 1. Multiple enlarged left supraclavicular lymph nodes as previously seen. 2. The no evidence of lymphadenopathy in the upper left neck or right neck. 3. 10 mm low-density left paraglottic focus, favored to reflect a partially fluid-filled internal laryngocele although a small neoplasm is difficult to exclude on this unenhanced study. 4. Small left pleural effusion. 5.  Emphysema (ICD10-J43.9). Electronically Signed   By: Logan Bores M.D.   On: 08/31/2017 10:10   Ct Bone Marrow Biopsy & Aspiration  Result Date: 08/31/2017 CLINICAL DATA:  Lymph node biopsy has demonstrated high-grade B-cell lymphoproliferative disorder. Bone marrow biopsy required for further workup. EXAM: CT GUIDED BONE MARROW ASPIRATION AND BIOPSY ANESTHESIA/SEDATION: Versed 1.0 mg IV, Fentanyl 50 mcg IV Total Moderate Sedation Time:  18 minutes. The patient's level of consciousness and physiologic status were continuously monitored during the procedure by Radiology nursing. PROCEDURE: The procedure risks, benefits, and alternatives were explained to the patient. Questions regarding the procedure were encouraged and answered. The patient understands and consents to the procedure. A time out was performed prior to initiating the procedure. The  left gluteal region was prepped with chlorhexidine. Sterile gown and sterile gloves were used for the procedure. Local anesthesia was provided with 1% Lidocaine. Under CT guidance, an 11 gauge On Control bone cutting needle was advanced from a  posterior approach into the left iliac bone. Needle positioning was confirmed with CT. Initial non heparinized and heparinized aspirate samples were obtained of bone marrow. Core biopsy was performed via the On Control drill needle. COMPLICATIONS: None FINDINGS: Inspection of initial aspirate did reveal visible particles. Intact core biopsy sample was obtained. IMPRESSION: CT guided bone marrow biopsy of left posterior iliac bone with both aspirate and core samples obtained. Electronically Signed   By: Aletta Edouard M.D.   On: 08/31/2017 12:52   Ir Nephrostomy Exchange Right  Result Date: 08/31/2017 INDICATION: Status post right percutaneous nephrostomy tube placement on 08/21/2017 to treat hydronephrosis secondary to ureteral obstruction. There has been some poor output from the nephrostomy tube as well as leakage of urine at the tube exit site. EXAM: RIGHT PERCUTANEOUS NEPHROSTOMY TUBE EXCHANGE WITH NEPHROSTOGRAM COMPARISON:  08/21/2017 MEDICATIONS: None ANESTHESIA/SEDATION: None CONTRAST:  15 mL Isovue-300 - administered into the collecting system(s) FLUOROSCOPY TIME:  Fluoroscopy Time: 1 minute and 6 seconds. 3.6 mGy. COMPLICATIONS: None immediate. PROCEDURE: Informed written consent was obtained from the patient after a thorough discussion of the procedural risks, benefits and alternatives. All questions were addressed. Maximal Sterile Barrier Technique was utilized including caps, mask, sterile gowns, sterile gloves, sterile drape, hand hygiene and skin antiseptic. A timeout was performed prior to the initiation of the procedure. The pre-existing nephrostomy tube was injected with contrast material and fluoroscopic spot images obtained to image the collecting system and ureter. The catheter was cut and removed over a guidewire. A new 10 French nephrostomy tube was then advanced over the wire and formed. The catheter was flushed and connected to a new gravity bag. It was secured at the skin with a Prolene  retention suture and StatLock device. FINDINGS: Nephrostogram shows moderate hydronephrosis as well as a dilated ureter. There is a high-grade obstruction of the distal right ureter in the upper pelvis at the level of the upper sacrum. The new nephrostomy tube was formed at the level of the renal pelvis. IMPRESSION: Exchange of right percutaneous nephrostomy tube for new catheter over a guidewire. Nephrostogram demonstrates high-grade right ureteral obstruction in the upper pelvis at the level of the upper sacrum. Electronically Signed   By: Aletta Edouard M.D.   On: 08/31/2017 12:57    Scheduled Meds: . allopurinol  300 mg Oral Daily  . dextromethorphan-guaiFENesin  1 tablet Oral BID  . feeding supplement  1 Container Oral TID BM  . furosemide  20 mg Intravenous Q6H  . pantoprazole  40 mg Oral Daily  . phosphorus  500 mg Oral TID  . polyethylene glycol  17 g Oral Daily  . senna  1 tablet Oral QHS  . simvastatin  20 mg Oral Daily   Continuous Infusions: . sodium chloride      LOS: 12 days   Kerney Elbe, DO [facility] Pager [phone]  If 7PM-7AM, please contact night-coverage [email or web address] Password [email or web address] 09/01/2017, 8:09 PM

## 2017-09-01 NOTE — Progress Notes (Addendum)
*  PRELIMINARY RESULTS* Vascular Ultrasound Bilateral lower extremity venous duplex has been completed.  Preliminary findings: No evidence of deep vein thrombosis in the visualized veins or baker's cysts bilaterally.   Kristina Torres 09/01/2017, 9:57 AM

## 2017-09-01 NOTE — Progress Notes (Signed)
Nutrition Follow-up  DOCUMENTATION CODES:   Morbid obesity  INTERVENTION:   -Boost Breeze po TID, each supplement provides 250 kcal and 9 grams of protein -Snacks TID  NUTRITION DIAGNOSIS:   Inadequate oral intake related to poor appetite as evidenced by per patient/family report.  Ongoing  GOAL:   Patient will meet greater than or equal to 90% of their needs  Progressing  MONITOR:   PO intake, Supplement acceptance, Weight trends, I & O's  REASON FOR ASSESSMENT:   Consult Assessment of nutrition requirement/status  ASSESSMENT:   Pt with PMH of arthritis, gout, essential HTN, HLD presents with N/V  9/13- per oncology note, work up highly suspicious for lymphoma 9/14- s/p bone marrow biopsy and rt nephrostomy tube change due to high grade obstruction of distal rt ureter  Per chart review, plan for lymph node biopsy on Wednesday, 09/05/17.   Spoke with pt and husband at bedside. Pt continues to eat very little. Per pt husband, pt is only consuming bites and sips at each meal- pt reports she consumed a few bites of eggs and toast this morning. Both are interested in options for nutritional supplements. Husband shares that they brought some Boost from home, but pt is reluctant to take it. Discussed importance of good nutritional intake per meals, snacks, and supplements to optimize nutritional status and healing. Per conversation with pt, she does not like milky-type supplements. Reviewed options from formulary and pt is amenable to consume Boost Breeze.   Labs reviewed.   Diet Order:  Diet Heart Room service appropriate? Yes; Fluid consistency: Thin; Fluid restriction: 1500 mL Fluid  Skin:   (closed lower back incision)  Last BM:  08/31/17  Height:   Ht Readings from Last 1 Encounters:  08/20/17 4' 10.5" (1.486 m)    Weight:   Wt Readings from Last 1 Encounters:  08/31/17 214 lb 2.1 oz (97.1 kg)    Ideal Body Weight:  44 kg  BMI:  Body mass index is 43.99  kg/m.  Estimated Nutritional Needs:   Kcal:  7619-5093 kcals  Protein:  95-105 g  Fluid:  >/= 2 L/d  EDUCATION NEEDS:   Education needs no appropriate at this time  Katoya Amato A. Jimmye Norman, RD, LDN, CDE Pager: [phone] After hours Pager: [phone]

## 2017-09-01 NOTE — Progress Notes (Signed)
  Request seen for internalization of ureteral stent.  Patient known to our service.  Will plan for this on Monday.   Keeleigh Terris S Katesha Eichel PA-C 09/01/2017 12:42 PM

## 2017-09-02 ENCOUNTER — Inpatient Hospital Stay (HOSPITAL_COMMUNITY): Payer: Medicare Other

## 2017-09-02 DIAGNOSIS — R509 Fever, unspecified: Secondary | ICD-10-CM

## 2017-09-02 HISTORY — PX: OTHER SURGICAL HISTORY: SHX169

## 2017-09-02 LAB — CBC WITH DIFFERENTIAL/PLATELET
BAND NEUTROPHILS: 0 %
Basophils Absolute: 0 10*3/uL (ref 0.0–0.1)
Basophils Relative: 0 %
Blasts: 0 %
EOS PCT: 0 %
Eosinophils Absolute: 0 10*3/uL (ref 0.0–0.7)
HEMATOCRIT: 26.8 % — AB (ref 36.0–46.0)
HEMOGLOBIN: 9.1 g/dL — AB (ref 12.0–15.0)
Lymphocytes Relative: 31 %
Lymphs Abs: 10.6 10*3/uL — ABNORMAL HIGH (ref 0.7–4.0)
MCH: 32.3 pg (ref 26.0–34.0)
MCHC: 34 g/dL (ref 30.0–36.0)
MCV: 95 fL (ref 78.0–100.0)
METAMYELOCYTES PCT: 0 %
MONOS PCT: 9 %
Monocytes Absolute: 3.1 10*3/uL — ABNORMAL HIGH (ref 0.1–1.0)
Myelocytes: 0 %
NEUTROS ABS: 20.6 10*3/uL — AB (ref 1.7–7.7)
Neutrophils Relative %: 60 %
Other: 0 %
Platelets: 63 10*3/uL — ABNORMAL LOW (ref 150–400)
Promyelocytes Absolute: 0 %
RBC: 2.82 MIL/uL — ABNORMAL LOW (ref 3.87–5.11)
RDW: 20 % — AB (ref 11.5–15.5)
WBC: 34.3 10*3/uL — ABNORMAL HIGH (ref 4.0–10.5)
nRBC: 0 /100 WBC

## 2017-09-02 LAB — BPAM RBC
BLOOD PRODUCT EXPIRATION DATE: 201809212359
ISSUE DATE / TIME: 201809151120
Unit Type and Rh: 1700

## 2017-09-02 LAB — URINE CULTURE: Culture: NO GROWTH

## 2017-09-02 LAB — TYPE AND SCREEN
ABO/RH(D): B NEG
Antibody Screen: NEGATIVE
UNIT DIVISION: 0

## 2017-09-02 LAB — COMPREHENSIVE METABOLIC PANEL
ALBUMIN: 1.5 g/dL — AB (ref 3.5–5.0)
ALK PHOS: 140 U/L — AB (ref 38–126)
ALT: 34 U/L (ref 14–54)
AST: 73 U/L — AB (ref 15–41)
Anion gap: 9 (ref 5–15)
BILIRUBIN TOTAL: 2.8 mg/dL — AB (ref 0.3–1.2)
BUN: 25 mg/dL — AB (ref 6–20)
CALCIUM: 6.1 mg/dL — AB (ref 8.9–10.3)
CO2: 24 mmol/L (ref 22–32)
CREATININE: 1.56 mg/dL — AB (ref 0.44–1.00)
Chloride: 98 mmol/L — ABNORMAL LOW (ref 101–111)
GFR calc Af Amer: 37 mL/min — ABNORMAL LOW (ref >60)
GFR, EST NON AFRICAN AMERICAN: 32 mL/min — AB (ref >60)
GLUCOSE: 111 mg/dL — AB (ref 65–99)
POTASSIUM: 3.1 mmol/L — AB (ref 3.5–5.1)
Sodium: 131 mmol/L — ABNORMAL LOW (ref 135–145)
Total Protein: 3.3 g/dL — ABNORMAL LOW (ref 6.5–8.1)

## 2017-09-02 LAB — MAGNESIUM: MAGNESIUM: 1.5 mg/dL — AB (ref 1.7–2.4)

## 2017-09-02 LAB — PHOSPHORUS: Phosphorus: 4.6 mg/dL (ref 2.5–4.6)

## 2017-09-02 LAB — PROCALCITONIN: Procalcitonin: 1.78 ng/mL

## 2017-09-02 MED ORDER — DEXTROSE 5 % IV SOLN
1.0000 g | INTRAVENOUS | Status: DC
  Administered 2017-09-02: 1 g via INTRAVENOUS
  Filled 2017-09-02 (×2): qty 1

## 2017-09-02 MED ORDER — CALCIUM GLUCONATE 10 % IV SOLN
1.0000 g | Freq: Once | INTRAVENOUS | Status: AC
  Administered 2017-09-02: 1 g via INTRAVENOUS
  Filled 2017-09-02: qty 10

## 2017-09-02 MED ORDER — MAGNESIUM SULFATE 2 GM/50ML IV SOLN
2.0000 g | Freq: Once | INTRAVENOUS | Status: AC
  Administered 2017-09-02: 2 g via INTRAVENOUS
  Filled 2017-09-02: qty 50

## 2017-09-02 MED ORDER — IOPAMIDOL (ISOVUE-300) INJECTION 61%
INTRAVENOUS | Status: AC
  Administered 2017-09-02: 60 mL
  Filled 2017-09-02: qty 30

## 2017-09-02 MED ORDER — VANCOMYCIN HCL IN DEXTROSE 1-5 GM/200ML-% IV SOLN: 1000.0000 mg | INTRAVENOUS | Status: DC

## 2017-09-02 MED ORDER — VANCOMYCIN HCL 10 G IV SOLR
1500.0000 mg | Freq: Once | INTRAVENOUS | Status: DC
  Filled 2017-09-02: qty 1500

## 2017-09-02 MED ORDER — VANCOMYCIN HCL 10 G IV SOLR
2000.0000 mg | Freq: Once | INTRAVENOUS | Status: DC
  Filled 2017-09-02: qty 2000

## 2017-09-02 MED ORDER — POTASSIUM CHLORIDE CRYS ER 20 MEQ PO TBCR
40.0000 meq | EXTENDED_RELEASE_TABLET | Freq: Two times a day (BID) | ORAL | Status: DC
  Administered 2017-09-02 – 2017-09-05 (×8): 40 meq via ORAL
  Filled 2017-09-02 (×9): qty 2

## 2017-09-02 NOTE — Consult Note (Signed)
[place] for [facility]ious [facility]  Total days of antibiotics 9        Day 1 cefepime               Reason for Consult: leukocytosis and intermitttent fever   Referring Physician: sheikh  Principal Problem:   Nausea & vomiting Active Problems:   Rheumatoid arthritis ([place])   Essential hypertension   Gout   UTI (urinary tract infection)   Sepsis ([place])   HLD (hyperlipidemia)   Hypotension   AKI (acute kidney injury) ([place])   Hypercalcemia   Thrombocytopenia ([facility])   Cough   Macrocytic anemia   Lymphadenopathy   Hypophosphatemia   Hypomagnesemia   Hyponatremia   Acute urinary retention   Hyperbilirubinemia    HPI: Kristina Torres is a 71 y.o. female with history of mild diastolic dysfunction, HLD, HTN who was admitted on 9/3 for N/V originally seen at [place] who found to have have AKI with Cr 2.08 up from BL of 0.98 in addition to hypercalcemia of 13.87. This is in the setting of a bulky retroperitoneal lymphadenopathy causing right hydronephrosis. While hospitalized, she had right nephrostomy placed and improved her obstruction and AKI. She has had her hypercalcemia treated and hem/onc has been consulted to help work up for [place]. Interestingly her WBC differential also shows atypical lymphocyte. On admit, it was initially 19K. Due to fever, and leukocytosis she underwent infectious work up, she was treated for hcap with vanco/piptazo then transitioned to 4 days of levofloxacin to complete 8 day course of treatment. Her leukocytosis resolved. In the last 3days, her leukocytosis has increased to 35K with 60% N, atypical lymphocytes still present. She has had fever last night, thus her hospitalist repeat her infectious work up concerning that she may have sepsis. During her hospitalization, she has had persistent low BP, and cardiology has been involved to try to help with diuresis and fluid overload, but EF 60%, so her LE edema is likely thought to not be cardiac in  origin. Patient was started on cefepime for concern for sepsis. Repeat blood cx on 9/15 ngtd at 24hr. Repeat cxr shows persistent left base patchy infiltrate as it was on admit. ua is not suggestive of infection.  Past Medical History:  Diagnosis Date  . Essential hypertension   . Gout   . HLD (hyperlipidemia)   . Rheumatoid arthritis ([place])     Allergies:  Allergies  Allergen Reactions  . Streptomycin Anaphylaxis    Current antibiotics:   MEDICATIONS: . allopurinol  300 mg Oral Daily  . dextromethorphan-guaiFENesin  1 tablet Oral BID  . feeding supplement  1 Container Oral TID BM  . pantoprazole  40 mg Oral Daily  . phosphorus  500 mg Oral TID  . polyethylene glycol  17 g Oral Daily  . potassium chloride  40 mEq Oral BID  . senna  1 tablet Oral QHS  . simvastatin  20 mg Oral Daily    Social History  Substance Use Topics  . Smoking status: Former Smoker    Quit date: 08/06/2017  . Smokeless tobacco: Never Used  . Alcohol use No    Family History  Problem Relation Age of Onset  . CVA Mother      Review of Systems  Constitutional: Negative for fever, chills, diaphoresis, activity change, appetite change, fatigue and unexpected weight change.  HENT: Negative for congestion, sore throat, rhinorrhea, sneezing, trouble swallowing and sinus pressure.  Eyes: Negative for photophobia and visual disturbance.  Respiratory: Negative for cough, chest tightness, shortness of breath, wheezing and stridor.  Cardiovascular: Negative for chest pain, palpitations and leg swelling.  Gastrointestinal: Negative for nausea, vomiting, abdominal pain, diarrhea, constipation, blood in stool, abdominal distention and anal bleeding.  Genitourinary: Negative for dysuria, hematuria, flank pain and difficulty urinating.  Musculoskeletal: Negative for myalgias, back pain, joint swelling, arthralgias and gait problem.  Skin: Negative for color change, pallor, rash and wound.  Neurological:  Negative for dizziness, tremors, weakness and light-headedness.  Hematological: Negative for adenopathy. Does not bruise/bleed easily.  Psychiatric/Behavioral: Negative for behavioral problems, confusion, sleep disturbance, dysphoric mood, decreased concentration and agitation.     OBJECTIVE: Temp:  [97.8 F (36.6 C)-100.6 F (38.1 C)] 98.4 F (36.9 C) (09/16 0957) Pulse Rate:  [68-101] 96 (09/16 0957) Resp:  [16-20] 20 (09/16 0957) BP: (83-105)/(35-56) 105/42 (09/16 0957) SpO2:  [88 %-96 %] 90 % (09/16 0957) Weight:  [197 lb (89.4 kg)-197 lb 1.5 oz (89.4 kg)] 197 lb 1.5 oz (89.4 kg) (09/16 0137) Physical Exam  Constitutional:  oriented to person, place, and time. appears well-developed and well-nourished. No distress.  HENT: [place]/AT, PERRLA, no scleral icterus. Healing herpes labialis lesion upper lip Mouth/Throat: Oropharynx is clear and moist. No oropharyngeal exudate.  Cardiovascular: Normal rate, regular rhythm and normal heart sounds. Exam reveals no gallop and no friction rub.  No murmur heard.  Pulmonary/Chest: Effort normal and breath sounds normal. No respiratory distress.  has no wheezes.  Neck = supple, no nuchal rigidity Abdominal: Soft. Bowel sounds are normal.  exhibits no distension. There is no tenderness.  Lymphadenopathy: no cervical adenopathy. No axillary adenopathy Neurological: alert and oriented to person, place, and time.  Skin: Skin is warm and dry. Scattered echymosis Ext: +3 pitting edema to lower extremities. anasarca Psychiatric: a normal mood and affect.  behavior is normal.    LABS: Results for orders placed or performed during the hospital encounter of 08/20/17 (from the past 48 hour(s))  CBC with Differential/Platelet     Status: Abnormal   Collection Time: 08/31/17  1:16 PM  Result Value Ref Range   WBC 13.2 (H) 4.0 - 10.5 K/uL   RBC 2.61 (L) 3.87 - 5.11 MIL/uL   Hemoglobin 8.6 (L) 12.0 - 15.0 g/dL   HCT 25.6 (L) 36.0 - 46.0 %   MCV 98.1 78.0  - 100.0 fL   MCH 33.0 26.0 - 34.0 pg   MCHC 33.6 30.0 - 36.0 g/dL   RDW 18.6 (H) 11.5 - 15.5 %   Platelets 69 (L) 150 - 400 K/uL    Comment: REPEATED TO VERIFY CONSISTENT WITH PREVIOUS RESULT    Neutrophils Relative % 70 %   Lymphocytes Relative 22 %   Monocytes Relative 3 %   Eosinophils Relative 0 %   Basophils Relative 2 %   Band Neutrophils 0 %   Metamyelocytes Relative 0 %   Myelocytes 0 %   Promyelocytes Absolute 0 %   Blasts 0 %   nRBC 0 0 /100 WBC   Other 3 %   Neutro Abs 9.2 (H) 1.7 - 7.7 K/uL   Lymphs Abs 2.9 0.7 - 4.0 K/uL   Monocytes Absolute 0.4 0.1 - 1.0 K/uL   Eosinophils Absolute 0.0 0.0 - 0.7 K/uL   Basophils Absolute 0.3 (H) 0.0 - 0.1 K/uL   WBC Morphology MILD LEFT SHIFT (1-5% METAS, OCC MYELO, OCC BANDS)     Comment: ATYPICAL MONONUCLEAR CELLS  Brain natriuretic peptide     Status: None  Collection Time: 08/31/17  2:30 PM  Result Value Ref Range   B Natriuretic Peptide 21.1 0.0 - 100.0 pg/mL  CBC with Differential/Platelet     Status: Abnormal   Collection Time: 09/01/17  3:29 AM  Result Value Ref Range   WBC 18.1 (H) 4.0 - 10.5 K/uL    Comment: WHITE COUNT CONFIRMED ON SMEAR   RBC 2.17 (L) 3.87 - 5.11 MIL/uL   Hemoglobin 7.3 (L) 12.0 - 15.0 g/dL   HCT 21.3 (L) 36.0 - 46.0 %   MCV 98.2 78.0 - 100.0 fL   MCH 33.6 26.0 - 34.0 pg   MCHC 34.3 30.0 - 36.0 g/dL   RDW 19.0 (H) 11.5 - 15.5 %   Platelets 62 (L) 150 - 400 K/uL    Comment: CONSISTENT WITH PREVIOUS RESULT   Neutrophils Relative % 52 %   Lymphocytes Relative 38 %   Monocytes Relative 10 %   Eosinophils Relative 0 %   Basophils Relative 0 %   Band Neutrophils 0 %   Metamyelocytes Relative 0 %   Myelocytes 0 %   Promyelocytes Absolute 0 %   Blasts 0 %   nRBC 0 0 /100 WBC   Neutro Abs 9.4 (H) 1.7 - 7.7 K/uL   Lymphs Abs 6.9 (H) 0.7 - 4.0 K/uL   Monocytes Absolute 1.8 (H) 0.1 - 1.0 K/uL   Eosinophils Absolute 0.0 0.0 - 0.7 K/uL   Basophils Absolute 0.0 0.0 - 0.1 K/uL   RBC Morphology  POLYCHROMASIA PRESENT     Comment: RARE NRBCs   WBC Morphology ATYPICAL LYMPHOCYTES   Comprehensive metabolic panel     Status: Abnormal   Collection Time: 09/01/17  3:29 AM  Result Value Ref Range   Sodium 132 (L) 135 - 145 mmol/L   Potassium 3.8 3.5 - 5.1 mmol/L   Chloride 102 101 - 111 mmol/L   CO2 24 22 - 32 mmol/L   Glucose, Bld 84 65 - 99 mg/dL   BUN 20 6 - 20 mg/dL   Creatinine, Ser 1.35 (H) 0.44 - 1.00 mg/dL   Calcium 6.5 (L) 8.9 - 10.3 mg/dL   Total Protein 3.4 (L) 6.5 - 8.1 g/dL   Albumin 1.6 (L) 3.5 - 5.0 g/dL   AST 54 (H) 15 - 41 U/L   ALT 37 14 - 54 U/L   Alkaline Phosphatase 138 (H) 38 - 126 U/L   Total Bilirubin 2.2 (H) 0.3 - 1.2 mg/dL   GFR calc non Af Amer 38 (L) >60 mL/min   GFR calc Af Amer 45 (L) >60 mL/min    Comment: (NOTE) The eGFR has been calculated using the CKD EPI equation. This calculation has not been validated in all clinical situations. eGFR's persistently <60 mL/min signify possible Chronic Kidney Disease.    Anion gap 6 5 - 15  Magnesium     Status: None   Collection Time: 09/01/17  3:29 AM  Result Value Ref Range   Magnesium 1.7 1.7 - 2.4 mg/dL  Phosphorus     Status: None   Collection Time: 09/01/17  3:29 AM  Result Value Ref Range   Phosphorus 3.5 2.5 - 4.6 mg/dL  Culture, Urine     Status: None   Collection Time: 09/01/17  9:07 AM  Result Value Ref Range   Specimen Description URINE, RANDOM    Special Requests NONE    Culture NO GROWTH    Report Status 09/02/2017 FINAL   Urinalysis, Routine w reflex microscopic  Status: Abnormal   Collection Time: 09/01/17  9:08 AM  Result Value Ref Range   Color, Urine YELLOW YELLOW   APPearance CLEAR CLEAR   Specific Gravity, Urine 1.010 1.005 - 1.030   pH 5.0 5.0 - 8.0   Glucose, UA NEGATIVE NEGATIVE mg/dL   Hgb urine dipstick SMALL (A) NEGATIVE   Bilirubin Urine NEGATIVE NEGATIVE   Ketones, ur NEGATIVE NEGATIVE mg/dL   Protein, ur NEGATIVE NEGATIVE mg/dL   Nitrite NEGATIVE NEGATIVE     Leukocytes, UA NEGATIVE NEGATIVE   RBC / HPF 0-5 0 - 5 RBC/hpf   WBC, UA 0-5 0 - 5 WBC/hpf   Bacteria, UA RARE (A) NONE SEEN   Squamous Epithelial / LPF NONE SEEN NONE SEEN   Mucus PRESENT    Hyaline Casts, UA PRESENT    Ca Oxalate Crys, UA PRESENT   Prepare RBC     Status: None   Collection Time: 09/01/17  9:32 AM  Result Value Ref Range   Order Confirmation ORDER PROCESSED BY BLOOD BANK   Culture, blood (routine x 2)     Status: None (Preliminary result)   Collection Time: 09/01/17  9:32 AM  Result Value Ref Range   Specimen Description BLOOD BLOOD LEFT ARM    Special Requests      IN PEDIATRIC BOTTLE Blood Culture results may not be optimal due to an excessive volume of blood received in culture bottles   Culture NO GROWTH < 24 HOURS    Report Status PENDING   Culture, blood (routine x 2)     Status: None (Preliminary result)   Collection Time: 09/01/17  9:32 AM  Result Value Ref Range   Specimen Description BLOOD BLOOD LEFT ARM    Special Requests      IN PEDIATRIC BOTTLE Blood Culture results may not be optimal due to an excessive volume of blood received in culture bottles   Culture NO GROWTH < 24 HOURS    Report Status PENDING   Type and screen [place]     Status: None   Collection Time: 09/01/17  9:32 AM  Result Value Ref Range   ABO/RH(D) B NEG    Antibody Screen NEG    Sample Expiration 09/04/2017    Unit Number [id number]    Blood Component Type RED CELLS,LR    Unit division 00    Status of Unit ISSUED,FINAL    Transfusion Status OK TO TRANSFUSE    Crossmatch Result Compatible   TSH     Status: None   Collection Time: 09/01/17  4:08 PM  Result Value Ref Range   TSH 4.005 0.350 - 4.500 uIU/mL    Comment: Performed by a 3rd Generation assay with a functional sensitivity of <=0.01 uIU/mL.  Procalcitonin     Status: None   Collection Time: 09/02/17  5:20 AM  Result Value Ref Range   Procalcitonin 1.78 ng/mL    Comment:         Interpretation: PCT > 0.5 ng/mL and <= 2 ng/mL: Systemic infection (sepsis) is possible, but other conditions are known to elevate PCT as well. (NOTE)         ICU PCT Algorithm               Non ICU PCT Algorithm    ----------------------------     ------------------------------         PCT < 0.25 ng/mL  PCT < 0.1 ng/mL     Stopping of antibiotics            Stopping of antibiotics       strongly encouraged.               strongly encouraged.    ----------------------------     ------------------------------       PCT level decrease by               PCT < 0.25 ng/mL       >= 80% from peak PCT       OR PCT 0.25 - 0.5 ng/mL          Stopping of antibiotics                                             encouraged.     Stopping of antibiotics           encouraged.    ----------------------------     ------------------------------       PCT level decrease by              PCT >= 0.25 ng/mL       < 80% from peak PCT        AND PCT >= 0.5 ng/mL             Continuing antibiotics                                              encouraged.       Continuing antibiotics            encouraged.    ----------------------------     ------------------------------     PCT level increase compared          PCT > 0.5 ng/mL         with peak PCT AND          PCT >= 0.5 ng/mL             Escalation of antibiotics                                          strongly encouraged.      Escalation of antibiotics        strongly encouraged.   CBC with Differential/Platelet     Status: Abnormal   Collection Time: 09/02/17  5:20 AM  Result Value Ref Range   WBC 34.3 (H) 4.0 - 10.5 K/uL    Comment: WHITE COUNT CONFIRMED ON SMEAR   RBC 2.82 (L) 3.87 - 5.11 MIL/uL   Hemoglobin 9.1 (L) 12.0 - 15.0 g/dL   HCT 26.8 (L) 36.0 - 46.0 %   MCV 95.0 78.0 - 100.0 fL   MCH 32.3 26.0 - 34.0 pg   MCHC 34.0 30.0 - 36.0 g/dL   RDW 20.0 (H) 11.5 - 15.5 %   Platelets 63 (L) 150 - 400 K/uL    Comment: CONSISTENT  WITH PREVIOUS RESULT   Neutrophils Relative % 60 %   Lymphocytes Relative 31 %    Comment: WITH IMMATURE FORMS   Monocytes Relative 9 %  Eosinophils Relative 0 %   Basophils Relative 0 %   Band Neutrophils 0 %   Metamyelocytes Relative 0 %   Myelocytes 0 %   Promyelocytes Absolute 0 %   Blasts 0 %   nRBC 0 0 /100 WBC   Other 0 %   Neutro Abs 20.6 (H) 1.7 - 7.7 K/uL   Lymphs Abs 10.6 (H) 0.7 - 4.0 K/uL   Monocytes Absolute 3.1 (H) 0.1 - 1.0 K/uL   Eosinophils Absolute 0.0 0.0 - 0.7 K/uL   Basophils Absolute 0.0 0.0 - 0.1 K/uL   RBC Morphology POLYCHROMASIA PRESENT    WBC Morphology ATYPICAL LYMPHOCYTES     Comment: MILD LEFT SHIFT (1-5% METAS, OCC MYELO, OCC BANDS) SMUDGE CELLS   Comprehensive metabolic panel     Status: Abnormal   Collection Time: 09/02/17  5:20 AM  Result Value Ref Range   Sodium 131 (L) 135 - 145 mmol/L   Potassium 3.1 (L) 3.5 - 5.1 mmol/L   Chloride 98 (L) 101 - 111 mmol/L   CO2 24 22 - 32 mmol/L   Glucose, Bld 111 (H) 65 - 99 mg/dL   BUN 25 (H) 6 - 20 mg/dL   Creatinine, Ser 1.56 (H) 0.44 - 1.00 mg/dL   Calcium 6.1 (LL) 8.9 - 10.3 mg/dL    Comment: CRITICAL RESULT CALLED TO, READ BACK BY AND VERIFIED WITH: N.HERRERA RN @ [phone] 09/02/17 BY C.EDENS    Total Protein 3.3 (L) 6.5 - 8.1 g/dL   Albumin 1.5 (L) 3.5 - 5.0 g/dL   AST 73 (H) 15 - 41 U/L   ALT 34 14 - 54 U/L   Alkaline Phosphatase 140 (H) 38 - 126 U/L   Total Bilirubin 2.8 (H) 0.3 - 1.2 mg/dL   GFR calc non Af Amer 32 (L) >60 mL/min   GFR calc Af Amer 37 (L) >60 mL/min    Comment: (NOTE) The eGFR has been calculated using the CKD EPI equation. This calculation has not been validated in all clinical situations. eGFR's persistently <60 mL/min signify possible Chronic Kidney Disease.    Anion gap 9 5 - 15  Magnesium     Status: Abnormal   Collection Time: 09/02/17  5:20 AM  Result Value Ref Range   Magnesium 1.5 (L) 1.7 - 2.4 mg/dL  Phosphorus     Status: None   Collection Time: 09/02/17   5:20 AM  Result Value Ref Range   Phosphorus 4.6 2.5 - 4.6 mg/dL    MICRO: 9/15 blood cx ngtd. 9/15 urine cx ngtd  IMAGING: Dg Chest 1 View  Result Date: 09/01/2017 CLINICAL DATA:  Shortness of breath EXAM: CHEST 1 VIEW COMPARISON:  August 30, 2017 FINDINGS: Continued patchy infiltrate in the left base, similar in the interval. No pneumothorax. The cardiomediastinal silhouette is stable. No other interval changes. A nodular density projected over the right upper lung laterally is likely artifactual as no nodule was seen in this region on the recent chest CT. IMPRESSION: 1. Persistent stable patchy infiltrate in the left base. 2. The nodular density over the lateral right apex may be artifactual as it was not seen on the recent CT scan. Recommend attention to this region on follow-up. Electronically Signed   By: Dorise Bullion III M.D   On: 09/01/2017 11:04    Assessment/Plan:  24EN F with aki and hypercalcemia found to have obstructive uropathy due to bulky retroperitoneal lymphadenopathy. Undergoing malignancy work up. Having leukocytosis and intermittent fever concern for infectious causes  vs. Malignancy causes. Currently on cefepime.  - I suspect her leukocytosis is related to underlying cause of lymphadenopathy. Appears on differential to have atypical lymphocytes  - can keep cefepime on for the next 48 hrs until BM biopsy results return tomorrow as well as blood cx findings.  Intermittent fevers = can also be attributed to her probable underlying malignancy  Hypotension = appears asymptomatic, she is also somewhat bed bound and suspect readings improved if sitting up in chair. Cardiology following as well.  Dr hatcher to provide further recs

## 2017-09-02 NOTE — Progress Notes (Signed)
Patient ID: Kristina Torres, female   DOB: [birth year], 71 y.o.   MRN: [id number]    Assessment: Right ureteral obstruction: her nephrostomy tube is draining well. She is scheduled for internalization of her tube with a double-J stent on Monday.  Plan: 1. Internalization of her right nephrostomy tube with a double-J stent by IR tomorrow.    Subjective: Patient reports feeling better today. No new complaints noted overnight.  Objective: Vital signs in last 24 hours: Temp:  [97.8 F (36.6 C)-100.6 F (38.1 C)] 98.7 F (37.1 C) (09/16 0530) Pulse Rate:  [68-101] 87 (09/16 0530) Resp:  [16-20] 20 (09/16 0530) BP: (83-98)/(35-56) 83/45 (09/16 0530) SpO2:  [88 %-96 %] 90 % (09/16 0530) Weight:  [89.4 kg (197 lb)-89.4 kg (197 lb 1.5 oz)] 89.4 kg (197 lb 1.5 oz) (09/16 0137)A  Intake/Output from previous day: 09/15 0701 - 09/16 0700 In: 2142 [P.O.:1742; Blood:315] Out: 1875 [HUDJS:9702] Intake/Output this shift: No intake/output data recorded.  Past Medical History:  Diagnosis Date  . Essential hypertension   . Gout   . HLD (hyperlipidemia)   . Rheumatoid arthritis ([place])     Physical Exam:  Lungs - Normal respiratory effort, chest expands symmetrically.  Abdomen - Soft, non-tender & non-distended.  Lab Results:  Recent Labs  08/31/17 1316 09/01/17 0329 09/02/17 0520  WBC 13.2* 18.1* PENDING  HGB 8.6* 7.3* 9.1*  HCT 25.6* 21.3* 26.8*   BMET  Recent Labs  08/31/17 0546 09/01/17 0329  NA 130* 132*  K 4.2 3.8  CL 101 102  CO2 21* 24  GLUCOSE 98 84  BUN 15 20  CREATININE 1.11* 1.35*  CALCIUM 6.9* 6.5*   No results for input(s): LABURIN in the last 72 hours. Results for orders placed or performed during the hospital encounter of 08/20/17  Blood Culture (routine x 2)     Status: None   Collection Time: 08/20/17  8:10 PM  Result Value Ref Range Status   Specimen Description BLOOD RIGHT HAND  Final   Special Requests IN PEDIATRIC BOTTLE Blood Culture adequate  volume  Final   Culture NO GROWTH 5 DAYS  Final   Report Status 08/25/2017 FINAL  Final  Blood Culture (routine x 2)     Status: None   Collection Time: 08/20/17  8:13 PM  Result Value Ref Range Status   Specimen Description BLOOD RIGHT ANTECUBITAL  Final   Special Requests   Final    BOTTLES DRAWN AEROBIC AND ANAEROBIC Blood Culture adequate volume   Culture NO GROWTH 5 DAYS  Final   Report Status 08/25/2017 FINAL  Final  Urine Culture     Status: None   Collection Time: 08/20/17  9:15 PM  Result Value Ref Range Status   Specimen Description URINE, CATHETERIZED  Final   Special Requests NONE  Final   Culture NO GROWTH  Final   Report Status 08/22/2017 FINAL  Final  Urine Culture     Status: None   Collection Time: 08/21/17  2:32 PM  Result Value Ref Range Status   Specimen Description URINE, CATHETERIZED  URINE FROM DRAIN PLACEMENT  Final   Special Requests NONE  Final   Culture NO GROWTH  Final   Report Status 08/22/2017 FINAL  Final  Culture, blood (Routine X 2) w Reflex to ID Panel     Status: None   Collection Time: 08/24/17  6:13 PM  Result Value Ref Range Status   Specimen Description BLOOD LEFT HAND  Final   Special Requests  Final    IN PEDIATRIC BOTTLE Blood Culture results may not be optimal due to an excessive volume of blood received in culture bottles   Culture NO GROWTH 5 DAYS  Final   Report Status 08/29/2017 FINAL  Final  Culture, blood (Routine X 2) w Reflex to ID Panel     Status: None   Collection Time: 08/24/17  6:14 PM  Result Value Ref Range Status   Specimen Description BLOOD RIGHT HAND  Final   Special Requests   Final    BOTTLES DRAWN AEROBIC AND ANAEROBIC Blood Culture adequate volume   Culture NO GROWTH 5 DAYS  Final   Report Status 08/29/2017 FINAL  Final  Urine Culture     Status: None   Collection Time: 08/24/17 11:01 PM  Result Value Ref Range Status   Specimen Description URINE, CLEAN CATCH  Final   Special Requests NONE  Final   Culture  NO GROWTH  Final   Report Status 08/26/2017 FINAL  Final    Studies/Results: Dg Chest 1 View  Result Date: 09/01/2017 CLINICAL DATA:  Shortness of breath EXAM: CHEST 1 VIEW COMPARISON:  August 30, 2017 FINDINGS: Continued patchy infiltrate in the left base, similar in the interval. No pneumothorax. The cardiomediastinal silhouette is stable. No other interval changes. A nodular density projected over the right upper lung laterally is likely artifactual as no nodule was seen in this region on the recent chest CT. IMPRESSION: 1. Persistent stable patchy infiltrate in the left base. 2. The nodular density over the lateral right apex may be artifactual as it was not seen on the recent CT scan. Recommend attention to this region on follow-up. Electronically Signed   By: Dorise Bullion III M.D   On: 09/01/2017 11:04      Goodell C 09/02/2017, 7:32 AM

## 2017-09-02 NOTE — Progress Notes (Signed)
       Echo reviewed. Normal LVEF 60-65%. Grade I diastolic dysfunction without evidence of elevated LA pressure, normal LA volume index.  RV TAPSE is around 2, TV tissue anular velocity is 14 showing normal RV function. IVC is fairly normal. Overall benign echo findings showing only mild LV diastolic dysfunction, does not support the level of edema and weight she has. Uptrend in Cr with diuresis, lasix has been stopped. Defer consideration for noncardiogenic causes of edema to primary team    Signed, Carlyle Dolly, MD  09/02/2017, 10:54 AM

## 2017-09-02 NOTE — Progress Notes (Signed)
Pharmacy Antibiotic Note  Kristina Torres is a 71 y.o. female with sepsis consults placed.  Pharmacy has been consulted for vancomycin and cefepime dosing.  Plan: Cefepime IV 1g every 24 hours Vancomycin IV 1,500 mg once  Follow-up on vancomycin maintenance regimen, could consider 1,000 mg every 24 hours based on kidney decline.  Follow-up cultures and LOT  Height: 4' 10.5" (148.6 cm) Weight: 197 lb 1.5 oz (89.4 kg) IBW/kg (Calculated) : 42.05  Temp (24hrs), Avg:98.8 F (37.1 C), Min:97.8 F (36.6 C), Max:100.6 F (38.1 C)   Recent Labs Lab 08/29/17 0433 08/30/17 0316 08/31/17 0546 08/31/17 1316 09/01/17 0329 09/02/17 0520  WBC 5.7 7.1  --  13.2* 18.1* 34.3*  CREATININE 0.96 1.02* 1.11*  --  1.35* 1.56*    Estimated Creatinine Clearance: 31.9 mL/min (A) (by C-G formula based on SCr of 1.56 mg/dL (H)).    Allergies  Allergen Reactions  . Streptomycin Anaphylaxis    Antimicrobials this admission: Vanc 9/7 >> 9/10 Zosyn 9/7 >> 9/11 CTX 9/3 >> 9/5 Levofloxacin 9/11 >> 9/14  Vancomycin 9/16 >> Cefepime 9/16 >>   Microbiology results: 9/3 BCx: ngtd 9/3 urine: neg 9/4 urine: neg 9/7 BCx: ngtd 9/7 UCx: neg  9/15 BCx >>  9/15 UCx >>    Thank you for allowing pharmacy to be a part of this patient's care.   Bridgett Larsson 09/02/2017 9:11 AM

## 2017-09-02 NOTE — Progress Notes (Signed)
Referring Physician(s): Kathie Rhodes  Supervising Physician: Corrie Mckusick  Patient Status:  [facility] - In-pt  Chief Complaint: Lymphoma  HPI:  Patient is well known to IR service. Right Hydronephrosis due to extrinsic compression of the distal right ureter S/P Right PCN by Dr. Barbie Banner on 08/21/2017 S/P Nephrostogram and exchange by Dr. Kathlene Cote on 08/31/2017  We are now asked to internalize right ureteral stent via the PCN.  She is not on blood thinners.  Allergies: Streptomycin  Medications: Prior to Admission medications   Medication Sig Start Date End Date Taking? Authorizing Provider  allopurinol (ZYLOPRIM) 300 MG tablet Take 300 mg by mouth daily. 05/19/17  Yes [provider]  Calcium Carb-Cholecalciferol (CALCIUM 1000 + D PO) Take 1 tablet by mouth daily.   Yes [provider]  ciprofloxacin (CIPRO) 250 MG tablet Take 250 mg by mouth 2 (two) times daily. 08/15/17  Yes [provider]  hydrochlorothiazide (MICROZIDE) 12.5 MG capsule Take 12.5 mg by mouth daily.  07/19/17  Yes [provider]  methotrexate (RHEUMATREX) 2.5 MG tablet Take 17.5 mg by mouth once a week. Take 7 tabs (2.'5mg'$  tablets) qMonday 05/20/17  Yes [provider]  ondansetron (ZOFRAN-ODT) 4 MG disintegrating tablet Take 4 mg by mouth as needed. 08/14/17  Yes [provider]  simvastatin (ZOCOR) 20 MG tablet Take 20 mg by mouth daily. 08/17/17  Yes [provider]     Vital Signs: BP (!) 83/45 (BP Location: Right Wrist) Comment: notified Christy   Pulse 87   Temp 98.7 F (37.1 C) (Oral)   Resp 20   Ht 4' 10.5" (1.486 m)   Wt 197 lb 1.5 oz (89.4 kg)   SpO2 90%   BMI 40.49 kg/m   Physical Exam Cardiovascular: Normal rate and regular rhythm.   Pulmonary/Chest: Effort normal.  Abdominal: Soft.  Musculoskeletal: Normal range of motion.  Neurological: She is alert.  Confusion is minimal; but thoughts are jumbled  Skin: Skin is warm and dry.    Psychiatric: She has a normal mood and affect.  Pt is confused this am Consented husband at bedside    Imaging: Dg Chest 1 View  Result Date: 09/01/2017 CLINICAL DATA:  Shortness of breath EXAM: CHEST 1 VIEW COMPARISON:  August 30, 2017 FINDINGS: Continued patchy infiltrate in the left base, similar in the interval. No pneumothorax. The cardiomediastinal silhouette is stable. No other interval changes. A nodular density projected over the right upper lung laterally is likely artifactual as no nodule was seen in this region on the recent chest CT. IMPRESSION: 1. Persistent stable patchy infiltrate in the left base. 2. The nodular density over the lateral right apex may be artifactual as it was not seen on the recent CT scan. Recommend attention to this region on follow-up. Electronically Signed   By: Dorise Bullion III M.D   On: 09/01/2017 11:04   Dg Chest 1 View  Result Date: 08/30/2017 CLINICAL DATA:  Shortness of breath, hypertension, hyperlipidemia, rheumatoid arthritis. EXAM: CHEST 1 VIEW COMPARISON:  Chest x-ray of August 25, 2017 FINDINGS: The lungs are adequately inflated. Interval improvement in the pulmonary interstitium has occurred. There remains small amount of fluid in the minor fissure on the right. Linear increased density at the left lung base persists. The heart is normal in size. The pulmonary vascularity is less prominent centrally. There is calcification in the wall of the aortic arch. There is multilevel degenerative disc disease of the thoracic spine. IMPRESSION: Decreased pulmonary interstitial edema  or infiltrates bilaterally may reflect improving CHF or less likely interstitial pneumonia. Persistent atelectasis or scarring at the left base. Thoracic aortic atherosclerosis. Electronically Signed   By: David  [place] M.D.   On: 08/30/2017 08:01   Ct Soft Tissue Neck Wo Contrast  Result Date: 08/31/2017 CLINICAL DATA:  Recent retroperitoneal and supraclavicular lymph node  biopsies concerning for lymphoma. Evaluation for cervical lymphadenopathy. EXAM: CT NECK WITHOUT CONTRAST TECHNIQUE: Multidetector CT imaging of the neck was performed following the standard protocol without intravenous contrast. COMPARISON:  Neck CT 12/13/2010.  Chest CT 08/23/2017. FINDINGS: Pharynx and larynx: Symmetric nasopharyngeal and palatine tonsillar soft tissues. 10 x 10 mm nodular low-density focus in the left paraglottic space appears to be associated with an internal laryngocele. A small amount of asymmetric soft tissue and gas were present in this location on the prior neck CT. Salivary glands: No inflammation, mass, or stone. Thyroid: Unremarkable. Lymph nodes: Multiple enlarged left supraclavicular lymph nodes as were partially seen on the recent CT, the largest measuring 16 mm in short axis. No enlarged lymph nodes more superiorly in the left neck. No right-sided cervical lymph node enlargement. Vascular: Mild vascular calcification. Limited intracranial: Unremarkable. Visualized orbits: Unremarkable. Mastoids and visualized paranasal sinuses: Polypoid mucosal thickening or mucous retention cysts inferiorly in the left maxillary sinus. Clear mastoid air cells. Skeleton: No suspicious osseous lesion. Moderate diffuse cervical facet arthrosis. Moderate lower cervical disc degeneration. Upper chest: Partially visualized small left pleural effusion. Emphysema. Other: None. IMPRESSION: 1. Multiple enlarged left supraclavicular lymph nodes as previously seen. 2. The no evidence of lymphadenopathy in the upper left neck or right neck. 3. 10 mm low-density left paraglottic focus, favored to reflect a partially fluid-filled internal laryngocele although a small neoplasm is difficult to exclude on this unenhanced study. 4. Small left pleural effusion. 5.  Emphysema (ICD10-J43.9). Electronically Signed   By: Sebastian Ache M.D.   On: 08/31/2017 10:10   Ct Bone Marrow Biopsy & Aspiration  Result Date:  08/31/2017 CLINICAL DATA:  Lymph node biopsy has demonstrated high-grade B-cell lymphoproliferative disorder. Bone marrow biopsy required for further workup. EXAM: CT GUIDED BONE MARROW ASPIRATION AND BIOPSY ANESTHESIA/SEDATION: Versed 1.0 mg IV, Fentanyl 50 mcg IV Total Moderate Sedation Time:  18 minutes. The patient's level of consciousness and physiologic status were continuously monitored during the procedure by Radiology nursing. PROCEDURE: The procedure risks, benefits, and alternatives were explained to the patient. Questions regarding the procedure were encouraged and answered. The patient understands and consents to the procedure. A time out was performed prior to initiating the procedure. The left gluteal region was prepped with chlorhexidine. Sterile gown and sterile gloves were used for the procedure. Local anesthesia was provided with 1% Lidocaine. Under CT guidance, an 11 gauge On Control bone cutting needle was advanced from a posterior approach into the left iliac bone. Needle positioning was confirmed with CT. Initial non heparinized and heparinized aspirate samples were obtained of bone marrow. Core biopsy was performed via the On Control drill needle. COMPLICATIONS: None FINDINGS: Inspection of initial aspirate did reveal visible particles. Intact core biopsy sample was obtained. IMPRESSION: CT guided bone marrow biopsy of left posterior iliac bone with both aspirate and core samples obtained. Electronically Signed   By: Irish Lack M.D.   On: 08/31/2017 12:52   Ir Nephrostomy Exchange Right  Result Date: 08/31/2017 INDICATION: Status post right percutaneous nephrostomy tube placement on 08/21/2017 to treat hydronephrosis secondary to ureteral obstruction. There has been some poor output from the nephrostomy  tube as well as leakage of urine at the tube exit site. EXAM: RIGHT PERCUTANEOUS NEPHROSTOMY TUBE EXCHANGE WITH NEPHROSTOGRAM COMPARISON:  08/21/2017 MEDICATIONS: None  ANESTHESIA/SEDATION: None CONTRAST:  15 mL Isovue-300 - administered into the collecting system(s) FLUOROSCOPY TIME:  Fluoroscopy Time: 1 minute and 6 seconds. 3.6 mGy. COMPLICATIONS: None immediate. PROCEDURE: Informed written consent was obtained from the patient after a thorough discussion of the procedural risks, benefits and alternatives. All questions were addressed. Maximal Sterile Barrier Technique was utilized including caps, mask, sterile gowns, sterile gloves, sterile drape, hand hygiene and skin antiseptic. A timeout was performed prior to the initiation of the procedure. The pre-existing nephrostomy tube was injected with contrast material and fluoroscopic spot images obtained to image the collecting system and ureter. The catheter was cut and removed over a guidewire. A new 10 French nephrostomy tube was then advanced over the wire and formed. The catheter was flushed and connected to a new gravity bag. It was secured at the skin with a Prolene retention suture and StatLock device. FINDINGS: Nephrostogram shows moderate hydronephrosis as well as a dilated ureter. There is a high-grade obstruction of the distal right ureter in the upper pelvis at the level of the upper sacrum. The new nephrostomy tube was formed at the level of the renal pelvis. IMPRESSION: Exchange of right percutaneous nephrostomy tube for new catheter over a guidewire. Nephrostogram demonstrates high-grade right ureteral obstruction in the upper pelvis at the level of the upper sacrum. Electronically Signed   By: Aletta Edouard M.D.   On: 08/31/2017 12:57    Labs:  CBC:  Recent Labs  08/30/17 0316 08/30/17 1447 08/31/17 1316 09/01/17 0329 09/02/17 0520  WBC 7.1  --  13.2* 18.1* 34.3*  HGB 8.2*  --  8.6* 7.3* 9.1*  HCT 24.7*  --  25.6* 21.3* 26.8*  PLT 76* 74* 69* 62* 63*    COAGS:  Recent Labs  08/20/17 2255 08/30/17 1447  INR 1.16 1.23  APTT 24 27    BMP:  Recent Labs  08/30/17 0316 08/31/17 0546  09/01/17 0329 09/02/17 0520  NA 129* 130* 132* 131*  K 3.7 4.2 3.8 3.1*  CL 100* 101 102 98*  CO2 23 21* 24 24  GLUCOSE 94 98 84 111*  BUN 21* 15 20 25*  CALCIUM 7.1* 6.9* 6.5* 6.1*  CREATININE 1.02* 1.11* 1.35* 1.56*  GFRNONAA 54* 49* 38* 32*  GFRAA >60 57* 45* 37*    LIVER FUNCTION TESTS:  Recent Labs  08/30/17 0316 08/31/17 0546 09/01/17 0329 09/02/17 0520  BILITOT 2.3* 2.4* 2.2* 2.8*  AST 50* 55* 54* 73*  ALT 39 39 37 34  ALKPHOS 173* 159* 138* 140*  PROT 3.6* 3.4* 3.4* 3.3*  ALBUMIN 1.7* 1.6* 1.6* 1.5*    Assessment and Plan:  Right Hydronephrosis due to extrinsic compression of the distal right ureter from extrinsic compression likely  from lymphoma.  S/P Right PCN by Dr. Barbie Banner on 08/21/2017 S/P Nephrostogram and exchange by Dr. Kathlene Cote on 08/31/2017  Will proceed with internalization right ureteral stent via the PCN tomorrow.  Risks and benefits of internalization of right ureteral stent were discussed with the patient including, but not limited to, infection, bleeding, significant bleeding causing loss or decrease in renal function or damage to adjacent structures.   This procedure involves the use of X-rays and because of the nature of the planned procedure, it is possible that we will have prolonged use of X-ray fluoroscopy.  Potential radiation risks to you include (  but are not limited to) the following: - A slightly elevated risk for cancer  several years later in life. This risk is typically less than 0.5% percent. This risk is low in comparison to the normal incidence of human cancer, which is 33% for women and 50% for men according to the [place]. - Radiation induced injury can include skin redness, resembling a rash, tissue breakdown / ulcers and hair loss (which can be temporary or permanent).   The likelihood of either of these occurring depends on the difficulty of the procedure and whether you are sensitive to radiation due to  previous procedures, disease, or genetic conditions.   IF your procedure requires a prolonged use of radiation, you will be notified and given written instructions for further action.  It is your responsibility to monitor the irradiated area for the 2 weeks following the procedure and to notify your physician if you are concerned that you have suffered a radiation induced injury.     All of the patient's questions were answered, patient is agreeable to proceed.  Consent signed and in chart.   Electronically Signed: Murrell Redden, PA-C 09/02/2017, 8:57 AM   I spent a total of 25 Minutes at the the patient's bedside AND on the patient's hospital floor or unit, greater than 50% of which was counseling/coordinating care for internalization of right ureteral stent.

## 2017-09-02 NOTE — Progress Notes (Signed)
CRITICAL VALUE STICKER  CRITICAL VALUE: Calcium= 6.1  RECEIVER (on-site recipient of call): [place] NOTIFIED: 1028 09/02/17  MESSENGER (representative from lab):  MD NOTIFIED:  Kristina Ferguson MD   [place]: [phone]  RESPONSE: awaiting orders

## 2017-09-02 NOTE — Progress Notes (Signed)
Paged MD regarding BP of 83/45, P 87. Pt states her BP is normally low around 90 as systolic, but not this low. MD does not want to give fluids d/t pt having +3-4 pitting edema in BLE. Pt just had CBC drawn. MD wants to wait and see if pt Hgb is at least 8 or not. Awaiting results and will page once posted. Pt is resting comfortably and is asymptomatic at this time. Will continue to monitor.   Eleanora Neighbor, RN

## 2017-09-02 NOTE — Progress Notes (Signed)
PROGRESS NOTE    Mareli Antunes  EYC:[id number] DOB: 02/09/1946 DOA: 08/20/2017 PCP: [name], MD   Brief Narrative:  Kristina Torres is a 71 y.o. female, PMH of HTN, HLD, gout, RA on MTX and renal mass presented to ED on 08/20/17 with complaints of nausea, nonbloody emesis, RUQ abdominal pain, lightheadedness, generalized weakness and cough. She was seen at [facility] on Tuesday prior to admission and found to have "right renal mass on ultrasound with blockage", started on oral ciprofloxacin for UTI and was supposed to see Urologist on Thursday with a CT scan. In the ED, transiently hypotensive, noted to have acute kidney injury with creatinine of 2.08, hypercalcemia with calcium of 13.87, oxygen saturation 90% on room air and CT abdomen demonstrated right hepatic lobe with hypodense mass, moderate retroperitoneal bulky lymphadenopathy, splenomegaly, severe right hydronephrosis and hydroureter. Urology consulted. IR placed right percutaneous nephrostomy. Oncology consulted and underwent retroperitoneal lymph node biopsy 9/6, preliminary pathology shows malignancy, suspicious for lymphoma but inadequate sample and hence oncology had IR do additional biopsy on 9/7-results showed ? Highgrade Lymphoma. Hypercalcemia resolved. Mental status has improved. Patient still feels weak.   Patient stated that Nephrostomy tube stopped working so IR was consulted and ordered a Nephrostogram and it was replaced 9/14. Dr. Burr Medico of Oncology re-evaluated and because cannot definitively call LNBiopsy High grade Lymphoma, FISH panel was ordered and Surgical Biopsy would be necessary for definitive Diagnosis. CT of Neck to evaluate adenopathy ordered by Heme/Onc and Dr. Burr Medico spoke with Dr. Constance Holster who will do surgical biopsy of Left Supraclavicular LN. Patient underwent Bone Marrow Biopsy 9/14 and Dr. Burr Medico spoke with Dr. Gari Crown of Pathology who states the flow and Core Biopsy will be Monday.  Cardiology evaluated  and recommending IV Lasix 20 mg q6h, addressing Nutritional Status, and Checking LE [place] for DVT's. Dr. Burr Medico spoke with Dr. Alyson Ingles of Urology and patient to undergo cystoscopy and ureter stenting the same day as LN Biopsy. Urology Dr. Karsten Ro evaluated patient 9/15 and recommended there was no indication for cystoscopy and recommended IR internalize Nephrostomy Tube with Double-J stenting during Hospitalization and will be done on Monday 09/03/17.   On 9/15 Patient's U/S of LE showed no DVT and Cardiology recommended continuing lasix with close eye on urine. Patient's Hb/Hct dropped so she was transfused 1 more unit of pRBC's. Leukocytosis rose again so she was pan-cultured to r/o another infection. Nutritionist consulted and recommended Boost Breeze TID.  9/16 Patient's WBC increased so she was ordered a CT Chest/Abd/Pelvis, Started on Empiric Cefepime (Vanc was auto discontinued), and ID was consulted. Plan is for patient to undergo Internalization of Right PCN in AM done by IR.   Assessment & Plan:   Principal Problem:   Nausea & vomiting Active Problems:   Rheumatoid arthritis ([place])   Essential hypertension   Gout   UTI (urinary tract infection)   Sepsis (HCC)   HLD (hyperlipidemia)   Hypotension   AKI (acute kidney injury) ([place])   Hypercalcemia   Thrombocytopenia (HCC)   Cough   Macrocytic anemia   Lymphadenopathy   Hypophosphatemia   Hypomagnesemia   Hyponatremia   Acute urinary retention   Hyperbilirubinemia  Severe right hydronephrosis and hydroureter:  -Urology consulted 9/4 and recommended nephrostomy tube placement by IR which was done same day. Urine cultures negative.  - Discussed with consulting Urologist on 9/7, recommends DC with percutaneous nephrostomy until outpatient follow-up with Dr. Karsten Ro regarding further evaluation including cystoscopy etc. He indicated that bladder mass could  well be lymphoma. -Discussed with Urology for Inpatient Cystoscopy and possible  Biopsy; Discussed with Dr. Alyson Ingles and he states cystoscopy is not possible in the hospital however after Dr. Ernestina Penna discussion patient to undergo cystoscopy and ureter stenting on the same day of LN Biopsy -Right PCN stopped draining so IR re-consulted and Patient to underwent Nephrostogram Nephrostomy Tube Exchange 9/14 -Urology Dr. Karsten Ro evaluated and recommends no Cystoscopy but internalizing Nephrostomy tube with Double J Stenting and will be done by IR on Monday 09/03/17; Maybe difficult traversing the distal ureteral obstruction during antegrade nephro-ureteral study.   Acute Urinary Retention -Foley Catheter Placed as Bladder Scan showed >511 mL of Urine -Will need follow up with Urology as an outpatient and will need to follow up with Dr. Karsten Ro as an outpatient.    Nausea and vomiting:  - Secondary to obstructive uropathy and acute kidney injury. - Resolved.   Hypercalcemia, improved -May be secondary to underlying malignancy/lymphoma complicated by use of Tums, HCTZ, calcium and vitamin D supplements at home which have been held.  -Corrected calcium was as high as 14.5 on 9/6.  -Treated with IV normal saline, IV Lasix, a dose of pamidronate 60 mg on 6/5, IM calcitonin 4 doses. Vitamin D, 25-hydroxy: 30.2. PTH related peptide: <2.0  PTH: 16.  - Resolved. - No was Hypocalcemic so given 1 gram of Calcium Glucoante - Ordered Ionized calcium  Anemia:  -As per oncology, likely related to methotrexate and anemia of chronic disease.  -Anemia panel: Iron 29, TIBC 214, saturation 14, ferritin 745, folate 9.6, B12: 767.  - As per oncology, transfuse to maintain hgb >'8mg'$ /dL. -S/p Transfusion of  2u PRBC, one 9/10 and one 9/15. This seemed to improve mentation considerably. - CBC showed Hb/Hct was 9.1/26.8 - Hold methotrexate per oncology - Patient to undergo Bone Marrow Biopsy in AM   Thrombocytopenia:  -Worsening. Platelet Count went from 100 -> 80 -> 63 -> 74 -> 69 -> 62 ->  63 -Stable without bleeding. As per oncology, likely related to methotrexate or autoimmune mediated. -Will need to rule out HIT so will send Ab -Bone Marrow Biopsy done 08/31/17 with Flow and Core Biopsy done Monday  -DIC Panel ordered by Hematology and showed Elevated D-Dimer at 1.28 and Fibrinogen Level  -Continue to Monitor and discussed with Oncology and appreciate Recc's -[id number] ordered to r/o TTP was 44.3% -Continue to Follow Onc Recc's and repeat CBC in AM  Hypokalemia:  -Improved; K+ was 3.1 -Replete with po KCl 40 mEQ BID -Continue to Monitor and Replete as Necessary -Repeat CMP in AM    Elevated lactate:  -Likely due to dehydration and acute kidney injury. Improved.  AKI:  -SCr 2.08 on admission up from baseline of 0.9 - 1.0 due to obstructive uropathy.  - Improved with PCN but started worsening . - BUN/Cr now went from 15/1.11 -> 20/1.35 -> 25/1.56 -IV Lasix Discontinued;  -Repeat CMP in AM  Acute hypoxic respiratory failure:  - Due to pneumonia as below. - Resolved with treatments as below  HCAP, Lobar Pneumonia (left mid and lower lung):  -CT chest shows mild posterior lower lobe bronchiectasis with associated consolidation. Developed fever of 101F on 9/7. Cultures drawn and initiated IV vancomycin and Zosyn.  -Pro-calcitonin 1.48 -> 1.67 -> 2.03 ->1.61 . Repeat blood cultures from 9/7: Negative to date.  -Chest x-ray 9/8 confirmed pneumonia.  - Since cultures continue to be negative, DC'ed vancomycin. Continues to improve, so will transition zosyn > po Levaquin which has  now D/Cd - Afebrile. WBC increased to 18.1 -CXR 9/13 showed Decreased pulmonary interstitial edema or infiltrates bilaterally may reflect improving CHF or less likely interstitial pneumonia. Persistent atelectasis or scarring at the left base. Thoracic aortic atherosclerosis -Repeat CT Chest   Essential Hypertension:  -HCTZ held. Blood pressure is currently soft off  antihypertensives.  Hyperlipidemia:  - Chronic, stable - Continue statin  Liver mass:  -CT abdomen showed vague hypodense mass with central calcification in the right hepatic lobe presumably corresponding to the MRI mass from 2011 which was thought to represent hemangioma.  -CT also showed moderate retroperitoneal somewhat bulky lymphadenopathy and splenomegaly raising concerns for metastatic disease or lymphoproliferative abnormality such as lymphoma.  -Oncology consulted 9/4 and indicated that the liver lesion is not suspicious for malignancy.  -As per oncology, based on initial RP lymph node biopsy, lymphoma is suspected but inadequate tissue sample and findings could also be due to lymphoproliferative disorder secondary to MTX or RA. Second biopsy off left [place] node done on 9/7, results showed ? Highgrade Lymphoma. Patient to undergo LN Resction by ENT based on CT NECK findings   - Dr. Burr Medico is coordinating with pathology regarding results and will follow-up patient after discussion with Pathologist   -Could Account for mildly elevated AST  Rheumatoid arthritis: Chronic, stable.  - Stopping methotrexate - Will need rheumatology follow up  Urethral/bladder mass:  - As per urology consultation, outpatient cystoscopy no longer necessary -?Lymphoma. -Plan As Above  Acute Metabolic Encephalopathy, improved  -Likely related to acute kidney injury and hypercalcemia (likely the main cause) complicating underlying possible mild dementia. No focal deficits. CT head without acute findings.  - Avoid sedating medications. Encouraged sleep hygiene.  - Consider neurocognitive testing as outpatient  Fall:  -Sustained a few days ago in her hospital room. No injury sustained.  - As per PT, SNF at time of discharge.  Nutrition:  - Very Poor oral intake as per family.  - Dietitian consulted. And recommended Boost Breeze TID - Encouraged oral intake. May be slowly increasing. - Albumin is low  at 1.5  Hypophosphatemia -Patient's Phos Level improved to 4.6 -Continue to Monitor and Replete Mag as Necessary -Repeat Phos Level in AM   Hypomagnesemia -Patient's Mag Level was 1.5  -Replete with IV Mag Sulfate -Continue to Monitor and Replete Mag as Necessary -Repeat Mag Level in AM   Hyponatremia -Sodium went from 142 -> 131 -> 129 -> 130 -> 132 -> 131 -Suspect Hypervolemia Hyponatremia; Getting IV Lasix 20 mg q6h per Cards -Continue to Monitor;  -Repeat CMP in AM   Hyperbilirubinemia -T Bili was 2.4 and went to 2.2 and now 2.8 -DIC Panel being ordered by Oncology and as above -Continue to Monitor and Repeat LFT's in AM   Bilateral LE Edema suspected Acute Diastolic CHF Exacerbation vs Poor Nutritional Status r/o DVT -ECHO ordered and showed EF of 60-65% with G1DD -Strict I's/O's; Patient is -0.278 Liters -Daily Weights; Patient is +22 lbs since admission -Fluid Restrict to 1500 mL -Consulted with Cardiology and recommended IV Lasix but now stopped due to worsening Kidney Fxn -LE Dopplers Checked and Negative for DVT -Will consider IV Albumin/Lasix Combination for temporizing measures  Adenopathy -Suspicious for Lymphoma -CT Neck ordered by Oncology and showed Multiple enlarged left supraclavicular lymph nodes as previously seen.  The no evidence of lymphadenopathy in the upper left neck or right neck.  10 mm low-density left paraglottic focus, favored to reflect a partially fluid-filled internal laryngocele although a small  neoplasm is difficult to exclude on this unenhanced study.  Small left pleural effusion. Emphysema  -ENT Dr. Constance Holster consulted and will perform surgical excision of supraclavicular LN to work up for Lymphoma and will be done on Wednesday of next week.   Hx of Gout -Not in Acute Flare -C/w Allopurinol  Leukocytosis -Worsening; Could be Reactive to Possible Lymphoma vs. Infection -Pan Culture and repeat Blood Cx, CXR, and Urinalysis with Urine Cx to  r/o Infection; Obtain Repeat CT Chest/Abd/Pelvis -Consulted Infectious Diseases Dr. Baxter Flattery for further evaluation and she suspectes leukocytois is related to underlying cause of lymphadenopathy as CBC with Diff has atypical lymphocytes. -Dr. Baxter Flattery recommends continuing Cefepime for the next 48 hours until BM biopsy results return tomorrow along with Blood Cx's   -Continue to Monitor and repeat CBC in AM  DVT prophylaxis: SCDs Code Status: FULL CODE Family Communication: Discussed Husband and family at bedside Disposition Plan: Remain Inpatient for further Workup and for Right PCN in AM  Consultants:   Urology  Medical Oncology  Interventional Radiology  Cardiology  ENT  Infectious Diseases   Procedures: Right percutaneous nephrostomy Retroperitoneal lymph node biopsy 9/6. Left supraclavicular lymph node biopsy 9/7 Right Percutaneous nephrostomy exchange 9/14 Bone Marrow Biopsy 9/14   Antimicrobials:  Anti-infectives    Start     Dose/Rate Route Frequency Ordered Stop   09/03/17 0930  vancomycin (VANCOCIN) IVPB 1000 mg/200 mL premix  Status:  Discontinued     1,000 mg 200 mL/hr over 60 Minutes Intravenous Every 24 hours 09/02/17 1046 09/02/17 1104   09/02/17 0930  vancomycin (VANCOCIN) 2,000 mg in sodium chloride 0.9 % 500 mL IVPB  Status:  Discontinued     2,000 mg 250 mL/hr over 120 Minutes Intravenous  Once 09/02/17 0917 09/02/17 0925   09/02/17 0930  vancomycin (VANCOCIN) 1,500 mg in sodium chloride 0.9 % 500 mL IVPB  Status:  Discontinued     1,500 mg 250 mL/hr over 120 Minutes Intravenous  Once 09/02/17 0925 09/02/17 1109   09/02/17 0915  ceFEPIme (MAXIPIME) 1 g in dextrose 5 % 50 mL IVPB     1 g 100 mL/hr over 30 Minutes Intravenous Every 24 hours 09/02/17 0906     09/02/17 0915  vancomycin (VANCOCIN) 1,500 mg in sodium chloride 0.9 % 500 mL IVPB  Status:  Discontinued     1,500 mg 250 mL/hr over 120 Minutes Intravenous  Once 09/02/17 0909 09/02/17 0916    08/28/17 1815  levofloxacin (LEVAQUIN) tablet 500 mg     500 mg Oral Daily with supper 08/28/17 1804 08/31/17 1744   08/28/17 1700  levofloxacin (LEVAQUIN) 25 MG/ML solution 500 mg  Status:  Discontinued     500 mg Oral Daily with supper 08/28/17 1432 08/28/17 1804   08/25/17 0800  vancomycin (VANCOCIN) IVPB 750 mg/150 ml premix  Status:  Discontinued     750 mg 150 mL/hr over 60 Minutes Intravenous Every 12 hours 08/24/17 1720 08/27/17 1601   08/24/17 1800  vancomycin (VANCOCIN) 2,000 mg in sodium chloride 0.9 % 500 mL IVPB     2,000 mg 250 mL/hr over 120 Minutes Intravenous  Once 08/24/17 1720 08/24/17 2036   08/24/17 1800  piperacillin-tazobactam (ZOSYN) IVPB 3.375 g  Status:  Discontinued     3.375 g 12.5 mL/hr over 240 Minutes Intravenous Every 8 hours 08/24/17 1720 08/28/17 1432   08/21/17 2000  cefTRIAXone (ROCEPHIN) 1 g in dextrose 5 % 50 mL IVPB  Status:  Discontinued  1 g 100 mL/hr over 30 Minutes Intravenous Every 24 hours 08/20/17 1957 08/22/17 1709   08/21/17 1330  ciprofloxacin (CIPRO) IVPB 400 mg     400 mg 200 mL/hr over 60 Minutes Intravenous To Radiology 08/21/17 1319 08/21/17 1430   08/20/17 1945  cefTRIAXone (ROCEPHIN) 2 g in dextrose 5 % 50 mL IVPB     2 g 100 mL/hr over 30 Minutes Intravenous  Once 08/20/17 1932 08/20/17 2242     Subjective: Seen and examined at bedside and rested ok. Still extremely swollen. No SOB today. No other complaints and ready for procedure in AM.   Objective: Vitals:   09/02/17 0137 09/02/17 0530 09/02/17 0957 09/02/17 1831  BP:  (!) 83/45 (!) 105/42 (!) 101/50  Pulse:  87 96 92  Resp:  '20 20 20  '$ Temp:  98.7 F (37.1 C) 98.4 F (36.9 C) 98.2 F (36.8 C)  TempSrc:  Oral Oral Oral  SpO2:  90% 90% 92%  Weight: 89.4 kg (197 lb 1.5 oz)     Height:        Intake/Output Summary (Last 24 hours) at 09/02/17 1925 Last data filed at 09/02/17 1832  Gross per 24 hour  Intake             1679 ml  Output             1375 ml  Net               304 ml   Filed Weights   08/31/17 2300 09/01/17 2122 09/02/17 0137  Weight: 97.1 kg (214 lb 2.1 oz) 89.4 kg (197 lb) 89.4 kg (197 lb 1.5 oz)   Examination: Physical Exam:  Constitutional: Pleasant Caucasian female in NAD. Appears calm and comfortable Eyes: Sclerae anicteric. Conjunctivae non-injected ENMT: Grossly normal hearing. MMM Neck: Supple with no JVD Respiratory: Diminished to ausculation but no appreciable wheezing. Had some mild crackles. Not tachypenic or using any accessory muscles to breathe Cardiovascular: RRR; S1 S2. 2-3+ LE Edema Abdomen: Soft, NT, Distended due to body habitus.  GU: Deferred Musculoskeletal: No cyanosis. No contractures Skin: Warm and Dry. 3+ LE pitting edema Neurologic: CN 2-12 grossly intact. No appreciable focal deficits Psychiatric: Pleasant mood and affect. Intact judgement and insight  Data Reviewed: I have personally reviewed following labs and imaging studies  CBC:  Recent Labs Lab 08/29/17 0433 08/30/17 0316 08/30/17 1447 08/31/17 1316 09/01/17 0329 09/02/17 0520  WBC 5.7 7.1  --  13.2* 18.1* 34.3*  NEUTROABS  --  4.5  --  9.2* 9.4* 20.6*  HGB 8.3* 8.2*  --  8.6* 7.3* 9.1*  HCT 25.0* 24.7*  --  25.6* 21.3* 26.8*  MCV 97.3 97.2  --  98.1 98.2 95.0  PLT 63* 76* 74* 69* 62* 63*   Basic Metabolic Panel:  Recent Labs Lab 08/29/17 0433 08/30/17 0316 08/31/17 0546 09/01/17 0329 09/02/17 0520  NA 131* 129* 130* 132* 131*  K 3.7 3.7 4.2 3.8 3.1*  CL 103 100* 101 102 98*  CO2 19* 23 21* 24 24  GLUCOSE 83 94 98 84 111*  BUN 24* 21* 15 20 25*  CREATININE 0.96 1.02* 1.11* 1.35* 1.56*  CALCIUM 7.2* 7.1* 6.9* 6.5* 6.1*  MG 1.4* 1.7 1.5* 1.7 1.5*  PHOS 1.3* 1.6* 1.8* 3.5 4.6   GFR: Estimated Creatinine Clearance: 31.9 mL/min (A) (by C-G formula based on SCr of 1.56 mg/dL (H)). Liver Function Tests:  Recent Labs Lab 08/29/17 9485 08/30/17 0316 08/31/17 0546 09/01/17 0329  09/02/17 0520  AST 47* 50* 55* 54* 73*    ALT 33 39 39 37 34  ALKPHOS 143* 173* 159* 138* 140*  BILITOT 2.2* 2.3* 2.4* 2.2* 2.8*  PROT 3.6* 3.6* 3.4* 3.4* 3.3*  ALBUMIN 1.7* 1.7* 1.6* 1.6* 1.5*   No results for input(s): LIPASE, AMYLASE in the last 168 hours. No results for input(s): AMMONIA in the last 168 hours. Coagulation Profile:  Recent Labs Lab 08/30/17 1447  INR 1.23   Cardiac Enzymes: No results for input(s): CKTOTAL, CKMB, CKMBINDEX, TROPONINI in the last 168 hours. BNP (last 3 results) No results for input(s): PROBNP in the last 8760 hours. HbA1C: No results for input(s): HGBA1C in the last 72 hours. CBG:  Recent Labs Lab 08/27/17 0748  GLUCAP 103*   Lipid Profile: No results for input(s): CHOL, HDL, LDLCALC, TRIG, CHOLHDL, LDLDIRECT in the last 72 hours. Thyroid Function Tests:  Recent Labs  09/01/17 1608  TSH 4.005   Anemia Panel: No results for input(s): VITAMINB12, FOLATE, FERRITIN, TIBC, IRON, RETICCTPCT in the last 72 hours. Sepsis Labs:  Recent Labs Lab 08/27/17 0432 08/29/17 0433 08/31/17 0546 09/02/17 0520  PROCALCITON 1.67 2.03 1.61 1.78    Recent Results (from the past 240 hour(s))  Culture, blood (Routine X 2) w Reflex to ID Panel     Status: None   Collection Time: 08/24/17  6:13 PM  Result Value Ref Range Status   Specimen Description BLOOD LEFT HAND  Final   Special Requests   Final    IN PEDIATRIC BOTTLE Blood Culture results may not be optimal due to an excessive volume of blood received in culture bottles   Culture NO GROWTH 5 DAYS  Final   Report Status 08/29/2017 FINAL  Final  Culture, blood (Routine X 2) w Reflex to ID Panel     Status: None   Collection Time: 08/24/17  6:14 PM  Result Value Ref Range Status   Specimen Description BLOOD RIGHT HAND  Final   Special Requests   Final    BOTTLES DRAWN AEROBIC AND ANAEROBIC Blood Culture adequate volume   Culture NO GROWTH 5 DAYS  Final   Report Status 08/29/2017 FINAL  Final  Urine Culture     Status: None    Collection Time: 08/24/17 11:01 PM  Result Value Ref Range Status   Specimen Description URINE, CLEAN CATCH  Final   Special Requests NONE  Final   Culture NO GROWTH  Final   Report Status 08/26/2017 FINAL  Final  Culture, Urine     Status: None   Collection Time: 09/01/17  9:07 AM  Result Value Ref Range Status   Specimen Description URINE, RANDOM  Final   Special Requests NONE  Final   Culture NO GROWTH  Final   Report Status 09/02/2017 FINAL  Final  Culture, blood (routine x 2)     Status: None (Preliminary result)   Collection Time: 09/01/17  9:32 AM  Result Value Ref Range Status   Specimen Description BLOOD BLOOD LEFT ARM  Final   Special Requests   Final    IN PEDIATRIC BOTTLE Blood Culture results may not be optimal due to an excessive volume of blood received in culture bottles   Culture NO GROWTH < 24 HOURS  Final   Report Status PENDING  Incomplete  Culture, blood (routine x 2)     Status: None (Preliminary result)   Collection Time: 09/01/17  9:32 AM  Result Value Ref Range Status   Specimen  Description BLOOD BLOOD LEFT ARM  Final   Special Requests   Final    IN PEDIATRIC BOTTLE Blood Culture results may not be optimal due to an excessive volume of blood received in culture bottles   Culture NO GROWTH < 24 HOURS  Final   Report Status PENDING  Incomplete    Radiology Studies: Dg Chest 1 View  Result Date: 09/01/2017 CLINICAL DATA:  Shortness of breath EXAM: CHEST 1 VIEW COMPARISON:  August 30, 2017 FINDINGS: Continued patchy infiltrate in the left base, similar in the interval. No pneumothorax. The cardiomediastinal silhouette is stable. No other interval changes. A nodular density projected over the right upper lung laterally is likely artifactual as no nodule was seen in this region on the recent chest CT. IMPRESSION: 1. Persistent stable patchy infiltrate in the left base. 2. The nodular density over the lateral right apex may be artifactual as it was not seen on  the recent CT scan. Recommend attention to this region on follow-up. Electronically Signed   By: Dorise Bullion III M.D   On: 09/01/2017 11:04    Scheduled Meds: . allopurinol  300 mg Oral Daily  . dextromethorphan-guaiFENesin  1 tablet Oral BID  . feeding supplement  1 Container Oral TID BM  . iopamidol      . pantoprazole  40 mg Oral Daily  . phosphorus  500 mg Oral TID  . polyethylene glycol  17 g Oral Daily  . potassium chloride  40 mEq Oral BID  . senna  1 tablet Oral QHS  . simvastatin  20 mg Oral Daily   Continuous Infusions: . ceFEPime (MAXIPIME) IV Stopped (09/02/17 1130)    LOS: 13 days   Kerney Elbe, DO [facility] Pager [phone]  If 7PM-7AM, please contact night-coverage [email or web address] Password [email or web address] 09/02/2017, 7:25 PM

## 2017-09-03 DIAGNOSIS — C8330 Diffuse large B-cell lymphoma, unspecified site: Secondary | ICD-10-CM

## 2017-09-03 DIAGNOSIS — D759 Disease of blood and blood-forming organs, unspecified: Secondary | ICD-10-CM

## 2017-09-03 DIAGNOSIS — C8338 Diffuse large B-cell lymphoma, lymph nodes of multiple sites: Secondary | ICD-10-CM

## 2017-09-03 DIAGNOSIS — R0602 Shortness of breath: Secondary | ICD-10-CM

## 2017-09-03 DIAGNOSIS — Z9889 Other specified postprocedural states: Secondary | ICD-10-CM

## 2017-09-03 DIAGNOSIS — D72829 Elevated white blood cell count, unspecified: Secondary | ICD-10-CM

## 2017-09-03 DIAGNOSIS — Z936 Other artificial openings of urinary tract status: Secondary | ICD-10-CM

## 2017-09-03 HISTORY — DX: Diffuse large B-cell lymphoma, unspecified site: C83.30

## 2017-09-03 LAB — CBC WITH DIFFERENTIAL/PLATELET
BLASTS: 0 %
Band Neutrophils: 0 %
Basophils Absolute: 0 10*3/uL (ref 0.0–0.1)
Basophils Relative: 0 %
Eosinophils Absolute: 0 10*3/uL (ref 0.0–0.7)
Eosinophils Relative: 0 %
HEMATOCRIT: 24.6 % — AB (ref 36.0–46.0)
HEMOGLOBIN: 8.6 g/dL — AB (ref 12.0–15.0)
Lymphocytes Relative: 22 %
Lymphs Abs: 6.1 10*3/uL — ABNORMAL HIGH (ref 0.7–4.0)
MCH: 33 pg (ref 26.0–34.0)
MCHC: 35 g/dL (ref 30.0–36.0)
MCV: 94.3 fL (ref 78.0–100.0)
MYELOCYTES: 0 %
Metamyelocytes Relative: 0 %
Monocytes Absolute: 3.3 10*3/uL — ABNORMAL HIGH (ref 0.1–1.0)
Monocytes Relative: 12 %
NEUTROS PCT: 66 %
NRBC: 0 /100{WBCs}
Neutro Abs: 18.5 10*3/uL — ABNORMAL HIGH (ref 1.7–7.7)
PROMYELOCYTES ABS: 0 %
Platelets: 49 10*3/uL — ABNORMAL LOW (ref 150–400)
RBC: 2.61 MIL/uL — AB (ref 3.87–5.11)
RDW: 20.4 % — ABNORMAL HIGH (ref 11.5–15.5)
WBC: 27.9 10*3/uL — AB (ref 4.0–10.5)

## 2017-09-03 LAB — COMPREHENSIVE METABOLIC PANEL
ALK PHOS: 125 U/L (ref 38–126)
ALT: 29 U/L (ref 14–54)
AST: 75 U/L — AB (ref 15–41)
Albumin: 1.5 g/dL — ABNORMAL LOW (ref 3.5–5.0)
Anion gap: 9 (ref 5–15)
BUN: 25 mg/dL — AB (ref 6–20)
CALCIUM: 6.3 mg/dL — AB (ref 8.9–10.3)
CO2: 24 mmol/L (ref 22–32)
CREATININE: 1.19 mg/dL — AB (ref 0.44–1.00)
Chloride: 99 mmol/L — ABNORMAL LOW (ref 101–111)
GFR, EST AFRICAN AMERICAN: 52 mL/min — AB (ref >60)
GFR, EST NON AFRICAN AMERICAN: 45 mL/min — AB (ref >60)
Glucose, Bld: 104 mg/dL — ABNORMAL HIGH (ref 65–99)
Potassium: 4 mmol/L (ref 3.5–5.1)
Sodium: 132 mmol/L — ABNORMAL LOW (ref 135–145)
Total Bilirubin: 3 mg/dL — ABNORMAL HIGH (ref 0.3–1.2)
Total Protein: 3.2 g/dL — ABNORMAL LOW (ref 6.5–8.1)

## 2017-09-03 LAB — PHOSPHORUS: PHOSPHORUS: 4 mg/dL (ref 2.5–4.6)

## 2017-09-03 LAB — C DIFFICILE QUICK SCREEN W PCR REFLEX
C DIFFICILE (CDIFF) INTERP: NOT DETECTED
C Diff antigen: NEGATIVE
C Diff toxin: NEGATIVE

## 2017-09-03 LAB — DIC (DISSEMINATED INTRAVASCULAR COAGULATION)PANEL
D-Dimer, Quant: 2.4 ug/mL-FEU — ABNORMAL HIGH (ref 0.00–0.50)
Platelets: 47 10*3/uL — ABNORMAL LOW (ref 150–400)
Prothrombin Time: 15 seconds (ref 11.4–15.2)
Smear Review: NONE SEEN

## 2017-09-03 LAB — PATHOLOGIST SMEAR REVIEW

## 2017-09-03 LAB — DIC (DISSEMINATED INTRAVASCULAR COAGULATION) PANEL
APTT: 27 s (ref 24–36)
FIBRINOGEN: 275 mg/dL (ref 210–475)
INR: 1.19

## 2017-09-03 LAB — MAGNESIUM: MAGNESIUM: 1.6 mg/dL — AB (ref 1.7–2.4)

## 2017-09-03 MED ORDER — MAGNESIUM SULFATE 2 GM/50ML IV SOLN
2.0000 g | Freq: Once | INTRAVENOUS | Status: DC
  Filled 2017-09-03: qty 50

## 2017-09-03 MED ORDER — SODIUM CHLORIDE 0.9 % IV SOLN: Freq: Once | INTRAVENOUS | Status: DC

## 2017-09-03 MED ORDER — DEXTROSE 5 % IV SOLN
2.0000 g | INTRAVENOUS | Status: DC
  Administered 2017-09-04: 2 g via INTRAVENOUS
  Filled 2017-09-03 (×2): qty 2

## 2017-09-03 MED ORDER — SODIUM CHLORIDE 0.9 % IV SOLN
1.0000 g | Freq: Once | INTRAVENOUS | Status: AC
  Administered 2017-09-03: 1 g via INTRAVENOUS
  Filled 2017-09-03 (×2): qty 10

## 2017-09-03 MED ORDER — PREDNISONE 20 MG PO TABS
60.0000 mg | ORAL_TABLET | Freq: Every day | ORAL | Status: AC
  Administered 2017-09-03 – 2017-09-07 (×5): 60 mg via ORAL
  Filled 2017-09-03 (×2): qty 3
  Filled 2017-09-03: qty 1
  Filled 2017-09-03 (×2): qty 3

## 2017-09-03 NOTE — Progress Notes (Signed)
IR aware of transfer to [facility] hospital.  Will recheck platelets in the AM and continue to work towards procedure as schedule allows.  Patient is NPO after midnight.   Brynda Greathouse, MS RD PA-C 4:00 PM

## 2017-09-03 NOTE — Progress Notes (Signed)
Physical Therapy Treatment Patient Details Name: Kristina Torres MRN: [id number] DOB: 11/03/1946 Today's Date: 09/03/2017    History of Present Illness 71 year old married female, PMH of HTN, HLD, gout, rheumatoid arthritis and renal mass presented to ED on 08/20/17 with complaints of nausea, nonbloody emesis, RUQ abdominal pain, lightheadedness, generalized weakness and cough.  Found to have UTI with renal blockage due to mass.  Urology consulted. IR placed right percutaneous nephrostomy.    PT Comments    Continuing work on functional mobility and activity tolerance;  Ms. Kawa doggedly declines getting OOB to the chair despite my best efforts in educating effects of bedrest and benefits of being OOB; Still, noting improvements in bed mobility and transfers; much less fatigue in upright sitting and standing; we discussed the goals of beginning walking with therapy soon   Follow Up Recommendations  SNF     Equipment Recommendations  Rolling walker with 5" wheels    Recommendations for Other Services       Precautions / Restrictions Precautions Precautions: Fall    Mobility  Bed Mobility Overal bed mobility: Needs Assistance Bed Mobility: Supine to Sit;Sit to Supine     Supine to sit: Mod assist Sit to supine: Mod assist   General bed mobility comments: Heavy mod assist to initiate LEs moving to EOB; once feet were clear, mod assist to elevate trunk; used bed pad to square off hips at EOB  Transfers Overall transfer level: Needs assistance   Transfers: Sit to/from Stand Sit to Stand: Mod assist         General transfer comment: Cues for safety and hand placement; Heavy assist to power up; verbal and tactile Cues for fully upright posture once standing; cues also to self-monitor for activity tolerance; Noted less fatigue in standing than last time she stood  Ambulation/Gait             General Gait Details: Pt vehemently declined   Stairs             Wheelchair Mobility    Modified Rankin (Stroke Patients Only)       Balance Overall balance assessment: Needs assistance Sitting-balance support: Feet supported;Bilateral upper extremity supported Sitting balance-Leahy Scale: Fair       Standing balance-Leahy Scale: Poor Standing balance comment: Stood in [place] for at least 3 minutes                             Cognition Arousal/Alertness: Awake/alert Behavior During Therapy: WFL for tasks assessed/performed (though needing LOTS of encouragement to participate) Overall Cognitive Status: Impaired/Different from baseline (but improved from last week)                                        Exercises      General Comments General comments (skin integrity, edema, etc.): See Doc Flowsheets for vitals taken during session; assisted pt with cleanup/hygeine in standing as she was incontinent of stool      Pertinent Vitals/Pain Pain Assessment: Faces Faces Pain Scale: Hurts even more Pain Location: "All over" Pain Descriptors / Indicators: Aching;Sore Pain Intervention(s): Monitored during session    Home Living                      Prior Function            PT Goals (current  goals can now be found in the care plan section) Acute Rehab PT Goals Patient Stated Goal: To return to independent PT Goal Formulation: With patient/family Time For Goal Achievement: 09/05/17 Potential to Achieve Goals: Good Progress towards PT goals: Progressing toward goals (slowly)    Frequency    Min 3X/week      PT Plan Current plan remains appropriate    Co-evaluation              AM-PAC PT "6 Clicks" Daily Activity  Outcome Measure  Difficulty turning over in bed (including adjusting bedclothes, sheets and blankets)?: A Lot Difficulty moving from lying on back to sitting on the side of the bed? : A Lot Difficulty sitting down on and standing up from a chair with arms (e.g.,  wheelchair, bedside commode, etc,.)?: Unable Help needed moving to and from a bed to chair (including a wheelchair)?: A Lot Help needed walking in hospital room?: A Lot Help needed climbing 3-5 steps with a railing? : Total 6 Click Score: 10    End of Session Equipment Utilized During Treatment: Gait belt Activity Tolerance: Patient tolerated treatment well Patient left: in bed;with call bell/phone within reach;with bed alarm set;with family/visitor present Nurse Communication: Mobility status PT Visit Diagnosis: Other abnormalities of gait and mobility (R26.89);Unsteadiness on feet (R26.81);Muscle weakness (generalized) (M62.81);Pain Pain - part of body:  (generalized)     Time: 0037-0488 PT Time Calculation (min) (ACUTE ONLY): 28 min  Charges:  $Therapeutic Activity: 23-37 mins                    G Codes:       Roney Marion, PT  Acute Rehabilitation Services Pager [phone] Office [phone]    [name] 09/03/2017, 3:54 PM

## 2017-09-03 NOTE — Progress Notes (Signed)
PROGRESS NOTE    Kristina Torres  MEQ:[id number] DOB: 12/20/1945 DOA: 08/20/2017 PCP: [name], MD   Brief Narrative:  Kristina Torres is a 71 y.o. female, PMH of HTN, HLD, gout, RA on MTX and renal mass presented to ED on 08/20/17 with complaints of nausea, nonbloody emesis, RUQ abdominal pain, lightheadedness, generalized weakness and cough. She was seen at [facility] on Tuesday prior to admission and found to have "right renal mass on ultrasound with blockage", started on oral ciprofloxacin for UTI and was supposed to see Urologist on Thursday with a CT scan. In the ED, transiently hypotensive, noted to have acute kidney injury with creatinine of 2.08, hypercalcemia with calcium of 13.87, oxygen saturation 90% on room air and CT abdomen demonstrated right hepatic lobe with hypodense mass, moderate retroperitoneal bulky lymphadenopathy, splenomegaly, severe right hydronephrosis and hydroureter. Urology consulted. IR placed right percutaneous nephrostomy. Oncology consulted and underwent retroperitoneal lymph node biopsy 9/6, preliminary pathology shows malignancy, suspicious for lymphoma but inadequate sample and hence oncology had IR do additional biopsy on 9/7-results showed ? Highgrade Lymphoma. Hypercalcemia resolved. Mental status has improved. Patient still feels weak.   Patient stated that Nephrostomy tube stopped working so IR was consulted and ordered a Nephrostogram and it was replaced 9/14. Dr. Burr Medico of Oncology re-evaluated and because cannot definitively call LNBiopsy High grade Lymphoma, FISH panel was ordered and Surgical Biopsy would be necessary for definitive Diagnosis. CT of Neck to evaluate adenopathy ordered by Heme/Onc and Dr. Burr Medico spoke with Dr. Constance Holster who will do surgical biopsy of Left Supraclavicular LN. Patient underwent Bone Marrow Biopsy 9/14 and Dr. Burr Medico spoke with Dr. Gari Crown of Pathology who states the flow and Core Biopsy will be Monday.  Cardiology evaluated  and recommending IV Lasix 20 mg q6h, addressing Nutritional Status, and Checking LE [place] for DVT's. Dr. Burr Medico spoke with Dr. Alyson Ingles of Urology and patient to undergo cystoscopy and ureter stenting the same day as LN Biopsy. Urology Dr. Karsten Ro evaluated patient 9/15 and recommended there was no indication for cystoscopy and recommended IR internalize Nephrostomy Tube with Double-J stenting during Hospitalization and will be done on Monday 09/03/17.   On 9/15 Patient's U/S of LE showed no DVT and Cardiology recommended continuing lasix with close eye on urine. Patient's Hb/Hct dropped so she was transfused 1 more unit of pRBC's. Leukocytosis rose again so she was pan-cultured to r/o another infection. Nutritionist consulted and recommended Boost Breeze TID.  9/16 Patient's WBC increased so she was ordered a CT Chest/Abd/Pelvis, Started on Empiric Cefepime (Vanc was auto discontinued), and ID was consulted. Will check for C Diff given patient's loose stools and recent Abx usage. Plan was to undergo Right PCN by IR but patient's Platelet Count was low and IR wanted Platelet Count to be above 70 for procedure. Will transfuse 1 pool of platelets. Discussed with Dr. Burr Medico in Hematology/Oncology and Bone Marrow Biopsy showed Active Diffuse Large B Cell Lymphoma so plan is to transfer to [facility] to start Chemotherapy.   Assessment & Plan:   Principal Problem:   Nausea & vomiting Active Problems:   Rheumatoid arthritis ([place])   Essential hypertension   Gout   UTI (urinary tract infection)   Sepsis (HCC)   HLD (hyperlipidemia)   Hypotension   AKI (acute kidney injury) ([place])   Hypercalcemia   Thrombocytopenia (HCC)   Cough   Macrocytic anemia   Lymphadenopathy   Hypophosphatemia   Hypomagnesemia   Hyponatremia   Acute urinary retention   Hyperbilirubinemia  Adenopathy from Diffuse Large B Cell Lymphoma -Suspicious for Lymphoma -CT Neck ordered by Oncology and showed Multiple enlarged left supraclavicular  lymph nodes as previously seen.  The no evidence of lymphadenopathy in the upper left neck or right neck.  10 mm low-density left paraglottic focus, favored to reflect a partially fluid-filled internal laryngocele although a small neoplasm is difficult to exclude on this unenhanced study. Small left pleural effusion. Emphysema  -ENT Dr. Constance Holster was consulted and will perform surgical excision of supraclavicular LN to work up for Lymphoma but will be cancelled now that Bone Marrow Biopsy shows Active Diffuse Large B Cell Lymphoma -Per Dr. Burr Medico, recommended transfer to [facility] to start Chemotherapy in AM; Patient to start Prednisone tonight -Further Care per Oncology   Severe right hydronephrosis and hydroureter:  -Urology consulted 9/4 and recommended nephrostomy tube placement by IR which was done same day. Urine cultures negative.  - Discussed with consulting Urologist on 9/7, recommends DC with percutaneous nephrostomy until outpatient follow-up with Dr. Karsten Ro regarding further evaluation including cystoscopy etc. He indicated that bladder mass could well be lymphoma. -Discussed with Urology for Inpatient Cystoscopy and possible Biopsy; Discussed with Dr. Alyson Ingles and he states cystoscopy is not possible in the hospital however after Dr. Ernestina Penna discussion patient to undergo cystoscopy and ureter stenting on the same day of LN Biopsy -Right PCN stopped draining so IR re-consulted and Patient to underwent Nephrostogram Nephrostomy Tube Exchange 9/14 -Urology Dr. Karsten Ro evaluated and recommends no Cystoscopy but internalizing Nephrostomy tube with Double J Stenting was to be done today but patient's Platelet Count was too low so IR wanted Platelet Count to be above 70; Maybe difficult traversing the distal ureteral obstruction during antegrade nephro-ureteral study.   Acute Urinary Retention -Foley Catheter Placed as Bladder Scan showed >511 mL of Urine -Will need follow up with Urology as an outpatient and  will need to follow up with Dr. Karsten Ro as an outpatient.    Nausea and vomiting:  - Secondary to obstructive uropathy and acute kidney injury. - Resolved.   Hypercalcemia, improved -May be secondary to underlying malignancy/lymphoma complicated by use of Tums, HCTZ, calcium and vitamin D supplements at home which have been held.  -Corrected calcium was as high as 14.5 on 9/6.  -Treated with IV normal saline, IV Lasix, a dose of pamidronate 60 mg on 6/5, IM calcitonin 4 doses. Vitamin D, 25-hydroxy: 30.2. PTH related peptide: <2.0  PTH: 16.  - Resolved. - No was Hypocalcemic so given 1 gram of Calcium Glucoante - Ordered Ionized calcium ordered and pending   Anemia:  -As per oncology, likely related to methotrexate and anemia of chronic disease.  -Anemia panel: Iron 29, TIBC 214, saturation 14, ferritin 745, folate 9.6, B12: 767.  - As per oncology, transfuse to maintain hgb >'8mg'$ /dL. -S/p Transfusion of  2u PRBC, one 9/10 and one 9/15. This seemed to improve mentation considerably. - CBC showed Hb/Hct was 8.6/24.6 - Hold methotrexate per oncology - Patient underwent Bone Marrow Biopsy  Thrombocytopenia:  -Worsening. Platelet Count went from 100 -> 80 -> 63 -> 74 -> 69 -> 62 -> 63 -> 49 -> 47 -Stable without bleeding. As per oncology, likely related to methotrexate or autoimmune mediated but now suspect from B Cell Lymphoma -Will need to rule out HIT so will send Ab Pending  -Bone Marrow Biopsy done 08/31/17 with Flow and Core Biopsy done Monday which showed Active B Cell Lymphoma -DIC Panel ordered by Hematology and showed Elevated D-Dimer at  1.28 and Fibrinogen Level  -Continue to Monitor and discussed with Oncology and appreciate Recc's -[id number] ordered to r/o TTP was 44.3% -Patient to be transferred to [facility] to start Chemotherapy -Dr. Burr Medico in Oncology will start Prednisone tonight -Ordered 1 pool of platelet for patient in anticipation of Internalization Nephrostomy Tube.    Hypokalemia:  -Improved; K+ was 4.0 -Continue to Monitor and Replete as Necessary -Repeat CMP in AM    Elevated lactate:  -Likely due to dehydration and acute kidney injury. Improved.  AKI:  -SCr 2.08 on admission up from baseline of 0.9 - 1.0 due to obstructive uropathy.  - Improved with PCN but started worsening . - BUN/Cr now went from 15/1.11 -> 20/1.35 -> 25/1.56 -> 25/1.19 -IV Lasix Discontinued;  -Repeat CMP in AM  Acute hypoxic respiratory failure:  - Due to pneumonia as below. - Resolved with treatments as below  HCAP, Lobar Pneumonia (left mid and lower lung):  -CT chest shows mild posterior lower lobe bronchiectasis with associated consolidation. Developed fever of 101F on 9/7. Cultures drawn and initiated IV vancomycin and Zosyn.  -Pro-calcitonin 1.48 -> 1.67 -> 2.03 ->1.61 . Repeat blood cultures from 9/7: Negative to date.  -Chest x-ray 9/8 confirmed pneumonia.  - Since cultures continue to be negative, DC'ed vancomycin. Continues to improve, so will transition zosyn > po Levaquin which has now D/Cd - Afebrile. WBC increased to 18.1 -CXR 9/13 showed Decreased pulmonary interstitial edema or infiltrates bilaterally may reflect improving CHF or less likely interstitial pneumonia. Persistent atelectasis or scarring at the left base. Thoracic aortic atherosclerosis -Repeat CT Chest showed New third spacing of fluid in the subcutaneous soft tissues of the lower chest, abdomen and pelvis with small to moderate amount of free fluid in the pelvis and new bilateral pleural effusions with compressive atelectasis. Also showed Mediastinal Adenopathy  Essential Hypertension:  -HCTZ held. Blood pressure is currently soft off antihypertensives.  Hyperlipidemia:  - Chronic, stable - Continue statin  Liver mass:  -CT abdomen showed vague hypodense mass with central calcification in the right hepatic lobe presumably corresponding to the MRI mass from 2011 which was  thought to represent hemangioma.  -CT also showed moderate retroperitoneal somewhat bulky lymphadenopathy and splenomegaly raising concerns for metastatic disease or lymphoproliferative abnormality such as lymphoma.  -Oncology consulted 9/4 and indicated that the liver lesion is not suspicious for malignancy.  -As per oncology, based on initial RP lymph node biopsy, lymphoma is suspected but inadequate tissue sample and findings could also be due to lymphoproliferative disorder secondary to MTX or RA. Second biopsy off left [place] node done on 9/7, results showed ? Highgrade Lymphoma. - Dr. Burr Medico is coordinating with pathology regarding results and will follow-up patient after discussion with Pathologist and bone marrow biopsy showed Active Lymphoma  -Could Account for mildly elevated AST  Rheumatoid arthritis: Chronic, stable.  - Stopping methotrexate - Will need rheumatology follow up  Urethral/bladder mass:  - As per urology consultation, outpatient cystoscopy no longer necessary -Lymphoma now diagnosed by Oncology  -Plan As Above  Acute Metabolic Encephalopathy, improved  -Likely related to acute kidney injury and hypercalcemia (likely the main cause) complicating underlying possible mild dementia. No focal deficits. CT head without acute findings.  - Avoid sedating medications. Encouraged sleep hygiene.  - Consider neurocognitive testing as outpatient  Fall:  -Sustained a few days ago in her hospital room. No injury sustained.  - As per PT, SNF at time of discharge.  Nutrition:  - Very Poor oral intake  as per family.  - Dietitian consulted. And recommended Boost Breeze TID - Encouraged oral intake. May be slowly increasing. - Albumin is low at 1.5  Hypophosphatemia -Patient's Phos Level improved to 4.0 -Continue to Monitor and Replete Mag as Necessary -Repeat Phos Level in AM   Hypomagnesemia -Patient's Mag Level was 1.6 -Replete with IV Mag Sulfate -Continue to Monitor and  Replete Mag as Necessary -Repeat Mag Level in AM   Hyponatremia -Sodium went from 142 -> 132 -Suspect Hypervolemia Hyponatremia;  -Was Getting IV Lasix 20 mg q6h per Cards but now D/C'd -Continue to Monitor;  -Repeat CMP in AM   Hyperbilirubinemia -T Bili was 3.0 -DIC Panel being ordered by Oncology and as above -Continue to Monitor and Repeat LFT's in AM   Bilateral LE Edema suspected Acute Diastolic CHF Exacerbation vs Poor Nutritional Status r/o DVT -ECHO ordered and showed EF of 60-65% with G1DD -Strict I's/O's; Patient is -0.278 Liters -Daily Weights; Patient is +22 lbs since admission -Fluid Restrict to 1500 mL -Consulted with Cardiology and recommended IV Lasix but now stopped due to worsening Kidney Fxn -LE Dopplers Checked and Negative for DVT -Will consider IV Albumin/Lasix Combination for temporizing measures but likely all related to Malignancy  Hx of Gout -Not in Acute Flare -C/w Allopurinol  Leukocytosis -Worsening; Could be Reactive to Possible Lymphoma vs. Infection -Pan Cultured and repeat Blood Cx, CXR, and Urinalysis with Urine Cx to r/o Infection; Obtain Repeat CT Chest/Abd/Pelvis -**Patient admitted to having 3-4 Loose Stools so will Send for C Diff -Consulted Infectious Diseases Dr. Baxter Flattery for further evaluation and she suspectes leukocytois is related to underlying cause of lymphadenopathy as CBC with Diff has atypical lymphocytes. -Dr. Baxter Flattery recommends continuing Cefepime for the next 48 hours until BM biopsy results return tomorrow along with Blood Cx's ; Dr. Johnnye Sima recommends continuing Abx for Now -Continue to Monitor and repeat CBC in AM  DVT prophylaxis: SCDs Code Status: FULL CODE Family Communication: Discussed Husband and family at bedside Disposition Plan: Transfer to [facility] for Chemotherapy   Consultants:   Urology  Medical Oncology  Interventional Radiology  Cardiology  ENT  Infectious Diseases   Procedures: Right percutaneous  nephrostomy Retroperitoneal lymph node biopsy 9/6. Left supraclavicular lymph node biopsy 9/7 Right Percutaneous nephrostomy exchange 9/14 Bone Marrow Biopsy 9/14   Antimicrobials:  Anti-infectives    Start     Dose/Rate Route Frequency Ordered Stop   09/04/17 1000  ceFEPIme (MAXIPIME) 2 g in dextrose 5 % 50 mL IVPB     2 g 100 mL/hr over 30 Minutes Intravenous Every 24 hours 09/03/17 1444     09/03/17 0930  vancomycin (VANCOCIN) IVPB 1000 mg/200 mL premix  Status:  Discontinued     1,000 mg 200 mL/hr over 60 Minutes Intravenous Every 24 hours 09/02/17 1046 09/02/17 1104   09/02/17 0930  vancomycin (VANCOCIN) 2,000 mg in sodium chloride 0.9 % 500 mL IVPB  Status:  Discontinued     2,000 mg 250 mL/hr over 120 Minutes Intravenous  Once 09/02/17 0917 09/02/17 0925   09/02/17 0930  vancomycin (VANCOCIN) 1,500 mg in sodium chloride 0.9 % 500 mL IVPB  Status:  Discontinued     1,500 mg 250 mL/hr over 120 Minutes Intravenous  Once 09/02/17 0925 09/02/17 1109   09/02/17 0915  ceFEPIme (MAXIPIME) 1 g in dextrose 5 % 50 mL IVPB  Status:  Discontinued     1 g 100 mL/hr over 30 Minutes Intravenous Every 24 hours 09/02/17 0906 09/03/17 1444  09/02/17 0915  vancomycin (VANCOCIN) 1,500 mg in sodium chloride 0.9 % 500 mL IVPB  Status:  Discontinued     1,500 mg 250 mL/hr over 120 Minutes Intravenous  Once 09/02/17 0909 09/02/17 0916   08/28/17 1815  levofloxacin (LEVAQUIN) tablet 500 mg     500 mg Oral Daily with supper 08/28/17 1804 08/31/17 1744   08/28/17 1700  levofloxacin (LEVAQUIN) 25 MG/ML solution 500 mg  Status:  Discontinued     500 mg Oral Daily with supper 08/28/17 1432 08/28/17 1804   08/25/17 0800  vancomycin (VANCOCIN) IVPB 750 mg/150 ml premix  Status:  Discontinued     750 mg 150 mL/hr over 60 Minutes Intravenous Every 12 hours 08/24/17 1720 08/27/17 1601   08/24/17 1800  vancomycin (VANCOCIN) 2,000 mg in sodium chloride 0.9 % 500 mL IVPB     2,000 mg 250 mL/hr over 120 Minutes  Intravenous  Once 08/24/17 1720 08/24/17 2036   08/24/17 1800  piperacillin-tazobactam (ZOSYN) IVPB 3.375 g  Status:  Discontinued     3.375 g 12.5 mL/hr over 240 Minutes Intravenous Every 8 hours 08/24/17 1720 08/28/17 1432   08/21/17 2000  cefTRIAXone (ROCEPHIN) 1 g in dextrose 5 % 50 mL IVPB  Status:  Discontinued     1 g 100 mL/hr over 30 Minutes Intravenous Every 24 hours 08/20/17 1957 08/22/17 1709   08/21/17 1330  ciprofloxacin (CIPRO) IVPB 400 mg     400 mg 200 mL/hr over 60 Minutes Intravenous To Radiology 08/21/17 1319 08/21/17 1430   08/20/17 1945  cefTRIAXone (ROCEPHIN) 2 g in dextrose 5 % 50 mL IVPB     2 g 100 mL/hr over 30 Minutes Intravenous  Once 08/20/17 1932 08/20/17 2242     Subjective: Seen and examined at bedside and stated she was having some loose stools for the last few days. No CP or SOB. States legs swollen but not as much. No evidence of bleeding.    Objective: Vitals:   09/03/17 0936 09/03/17 1214 09/03/17 1245 09/03/17 1402  BP: (!) 94/39 (!) 104/38 (!) 93/41 (!) 95/51  Pulse: 95 91 88 92  Resp: '16 16 18   '$ Temp: 98.3 F (36.8 C) 98 F (36.7 C) 98.3 F (36.8 C) 98.6 F (37 C)  TempSrc: Oral Oral Oral Oral  SpO2: 96% 93% 92% 93%  Weight:      Height:        Intake/Output Summary (Last 24 hours) at 09/03/17 1453 Last data filed at 09/03/17 1358  Gross per 24 hour  Intake          1797.92 ml  Output             1826 ml  Net           -28.08 ml   Filed Weights   09/02/17 0137 09/02/17 2112 09/03/17 0413  Weight: 89.4 kg (197 lb 1.5 oz) 95.3 kg (210 lb) 95.3 kg (210 lb 1.6 oz)   Examination: Physical Exam:  Constitutional: Pleasant Caucasian female in NAD. Appears calm and comfortable.  Eyes: Sclerae anicteric; Conjunctivae Non-injected ENMT: Grossly normal hearing. External ears and nose appear normal. Neck: Supple with no JVD. Had some lymphadenopathy Respiratory: Diminished to auscultation but no wheezing. Unlabored  breathing Cardiovascular: RRR; S1 S2; 3+ LE edemea Abdomen: Soft, NT, Distended due to body habitus. Bowel sounds present GU: Deferred. Musculoskeletal: No cyanosis; No contractures Skin: Warm and Dry. Has some ecchymosis on Upper Right arm. 3+ LE Edema Neurologic: CN 2-12  grossly intact. No appreciable focal deficits Psychiatric: Pleasant mood and affect. Intact judgement and insight  Data Reviewed: I have personally reviewed following labs and imaging studies  CBC:  Recent Labs Lab 08/30/17 0316  08/31/17 1316 09/01/17 0329 09/02/17 0520 09/03/17 0326 09/03/17 1018  WBC 7.1  --  13.2* 18.1* 34.3* 27.9*  --   NEUTROABS 4.5  --  9.2* 9.4* 20.6* 18.5*  --   HGB 8.2*  --  8.6* 7.3* 9.1* 8.6*  --   HCT 24.7*  --  25.6* 21.3* 26.8* 24.6*  --   MCV 97.2  --  98.1 98.2 95.0 94.3  --   PLT 76*  < > 69* 62* 63* 49* 47*  < > = values in this interval not displayed. Basic Metabolic Panel:  Recent Labs Lab 08/30/17 0316 08/31/17 0546 09/01/17 0329 09/02/17 0520 09/03/17 0326  NA 129* 130* 132* 131* 132*  K 3.7 4.2 3.8 3.1* 4.0  CL 100* 101 102 98* 99*  CO2 23 21* '24 24 24  '$ GLUCOSE 94 98 84 111* 104*  BUN 21* 15 20 25* 25*  CREATININE 1.02* 1.11* 1.35* 1.56* 1.19*  CALCIUM 7.1* 6.9* 6.5* 6.1* 6.3*  MG 1.7 1.5* 1.7 1.5* 1.6*  PHOS 1.6* 1.8* 3.5 4.6 4.0   GFR: Estimated Creatinine Clearance: 43.4 mL/min (A) (by C-G formula based on SCr of 1.19 mg/dL (H)). Liver Function Tests:  Recent Labs Lab 08/30/17 0316 08/31/17 0546 09/01/17 0329 09/02/17 0520 09/03/17 0326  AST 50* 55* 54* 73* 75*  ALT 39 39 37 34 29  ALKPHOS 173* 159* 138* 140* 125  BILITOT 2.3* 2.4* 2.2* 2.8* 3.0*  PROT 3.6* 3.4* 3.4* 3.3* 3.2*  ALBUMIN 1.7* 1.6* 1.6* 1.5* 1.5*   No results for input(s): LIPASE, AMYLASE in the last 168 hours. No results for input(s): AMMONIA in the last 168 hours. Coagulation Profile:  Recent Labs Lab 08/30/17 1447 09/03/17 1018  INR 1.23 1.19   Cardiac  Enzymes: No results for input(s): CKTOTAL, CKMB, CKMBINDEX, TROPONINI in the last 168 hours. BNP (last 3 results) No results for input(s): PROBNP in the last 8760 hours. HbA1C: No results for input(s): HGBA1C in the last 72 hours. CBG: No results for input(s): GLUCAP in the last 168 hours. Lipid Profile: No results for input(s): CHOL, HDL, LDLCALC, TRIG, CHOLHDL, LDLDIRECT in the last 72 hours. Thyroid Function Tests:  Recent Labs  09/01/17 1608  TSH 4.005   Anemia Panel: No results for input(s): VITAMINB12, FOLATE, FERRITIN, TIBC, IRON, RETICCTPCT in the last 72 hours. Sepsis Labs:  Recent Labs Lab 08/29/17 0433 08/31/17 0546 09/02/17 0520  PROCALCITON 2.03 1.61 1.78    Recent Results (from the past 240 hour(s))  Culture, blood (Routine X 2) w Reflex to ID Panel     Status: None   Collection Time: 08/24/17  6:13 PM  Result Value Ref Range Status   Specimen Description BLOOD LEFT HAND  Final   Special Requests   Final    IN PEDIATRIC BOTTLE Blood Culture results may not be optimal due to an excessive volume of blood received in culture bottles   Culture NO GROWTH 5 DAYS  Final   Report Status 08/29/2017 FINAL  Final  Culture, blood (Routine X 2) w Reflex to ID Panel     Status: None   Collection Time: 08/24/17  6:14 PM  Result Value Ref Range Status   Specimen Description BLOOD RIGHT HAND  Final   Special Requests   Final  BOTTLES DRAWN AEROBIC AND ANAEROBIC Blood Culture adequate volume   Culture NO GROWTH 5 DAYS  Final   Report Status 08/29/2017 FINAL  Final  Urine Culture     Status: None   Collection Time: 08/24/17 11:01 PM  Result Value Ref Range Status   Specimen Description URINE, CLEAN CATCH  Final   Special Requests NONE  Final   Culture NO GROWTH  Final   Report Status 08/26/2017 FINAL  Final  Culture, Urine     Status: None   Collection Time: 09/01/17  9:07 AM  Result Value Ref Range Status   Specimen Description URINE, RANDOM  Final   Special  Requests NONE  Final   Culture NO GROWTH  Final   Report Status 09/02/2017 FINAL  Final  Culture, blood (routine x 2)     Status: None (Preliminary result)   Collection Time: 09/01/17  9:32 AM  Result Value Ref Range Status   Specimen Description BLOOD BLOOD LEFT ARM  Final   Special Requests   Final    IN PEDIATRIC BOTTLE Blood Culture results may not be optimal due to an excessive volume of blood received in culture bottles   Culture NO GROWTH < 24 HOURS  Final   Report Status PENDING  Incomplete  Culture, blood (routine x 2)     Status: None (Preliminary result)   Collection Time: 09/01/17  9:32 AM  Result Value Ref Range Status   Specimen Description BLOOD BLOOD LEFT ARM  Final   Special Requests   Final    IN PEDIATRIC BOTTLE Blood Culture results may not be optimal due to an excessive volume of blood received in culture bottles   Culture NO GROWTH < 24 HOURS  Final   Report Status PENDING  Incomplete    Radiology Studies: Ct Abdomen Pelvis Wo Contrast  Result Date: 09/02/2017 CLINICAL DATA:  Leukocytosis. History of dyspnea, appendectomy and partial hysterectomy. Ureteral obstruction with nephrostomy placed on the right. EXAM: CT CHEST, ABDOMEN AND PELVIS WITHOUT CONTRAST TECHNIQUE: Multidetector CT imaging of the chest, abdomen and pelvis was performed following the standard protocol without IV contrast. COMPARISON:  CXR 09/01/2017, CT abdomen pelvis 08/20/2017. 02/10/2010 MRI FINDINGS: CT CHEST FINDINGS Cardiovascular: Normal branch pattern of the great vessels with atherosclerosis identified within the right brachiocephalic and left subclavian artery origins. Mild aortic atherosclerosis at the arch without aneurysm. Dilatation of the main pulmonary artery up to 2.9 cm consistent with chronic pulmonary hypertension is noted. Heart is normal in size. There is minimal pericardial fluid or thickening anteriorly. Mediastinum/Nodes: Enlarged supraclavicular adenopathy as before, the largest  on the left measuring 2 cm short axis with superior mediastinal adenopathy also noted, the largest is right paratracheal measuring 1.1 cm with prevascular lymph nodes measuring up to 1 cm short axis. Trachea and mainstem bronchi appear patent. No thyromegaly. Normal-appearing esophagus. Lungs/Pleura: New small bilateral pleural effusions with adjacent compressive atelectasis. Subpleural blebs are seen in the upper lobes right greater than left. Musculoskeletal: No chest wall mass or suspicious bone lesions identified. CT ABDOMEN PELVIS FINDINGS Hepatobiliary: A hypodense lesion with central calcification measuring 6.5 x 6.9 x 9.6 cm is again seen at the right hepatic dome with central calcification measuring 2.8 cm in diameter. Reportedly this was felt to represent a hemangioma on prior MRI from 2011. No biliary dilatation. No calcified gallstones. Pancreas: Unremarkable. No pancreatic ductal dilatation or surrounding inflammatory changes. Spleen: The spleen is enlarged measuring 14.2 x 10.6 x 14.1 cm (volume = 1110 cm^3).  No focal masses. Adrenals/Urinary Tract: Pain percutaneous nephrostomy tube is seen within the right renal collecting system with chronic perinephric fat stranding. No obstructive uropathy or nephrolithiasis is currently noted. Normal adrenal glands. Stomach/Bowel: Decompressed stomach with normal small bowel rotation. Enteric contrast is seen within distal ileum and large bowel to the level of the mid sigmoid colon. There is extensive descending and sigmoid diverticulosis without acute diverticulitis. Vascular/Lymphatic: Moderate aortoiliac atherosclerosis without aneurysm. Once again, retroperitoneal para-aortic and retrocaval lymphadenopathy is again seen, largest measuring up to 2 cm short axis to the left of the aorta and 1.8 cm short axis to the right. Reproductive: Calcified uterine fibroids are noted. No adnexal masses. Other: New moderate third spacing of fluid with indurated subcutaneous  soft tissues of the lower chest, abdomen or pelvis. New small to moderate free fluid is seen within the lower abdomen and pelvis. Musculoskeletal: Grade 1 anterolisthesis of L4 and L5 attributable to facet arthropathy. There is lower lumbar degenerative facet arthropathy L3 through S1. No acute nor suspicious osseous abnormalities. IMPRESSION: 1. Supraclavicular, mediastinal, and retroperitoneal adenopathy is again noted consistent with lymphoma, metastasis or lymphoproliferative disorder. 2. Ill-defined hypodense right hepatic dome mass measuring 6.5 x 6.9 x 9.6 cm is again noted with central 2.8 cm calcification, noted to be behaving like a hemangioma on MRI from 2011. 3. Stable splenomegaly. 4. New third spacing of fluid in the subcutaneous soft tissues of the lower chest, abdomen and pelvis with small to moderate amount of free fluid in the pelvis and new bilateral pleural effusions with compressive atelectasis. 5. Calcified uterine fibroids. 6. Decompression of previously noted hydronephrosis on the right by percutaneous nephrostomy tube. No obstructing calculus in the distal right ureter. Given adenopathy, extrinsic compression from adenopathy may have contributed to the hydronephrosis and hydroureter. 7. Mild lower lumbar facet arthropathy. No acute nor suspicious osseous abnormalities. Electronically Signed   By: Ashley Royalty M.D.   On: 09/02/2017 23:59   Ct Chest Wo Contrast  Result Date: 09/02/2017 CLINICAL DATA:  Leukocytosis. History of dyspnea, appendectomy and partial hysterectomy. Ureteral obstruction with nephrostomy placed on the right. EXAM: CT CHEST, ABDOMEN AND PELVIS WITHOUT CONTRAST TECHNIQUE: Multidetector CT imaging of the chest, abdomen and pelvis was performed following the standard protocol without IV contrast. COMPARISON:  CXR 09/01/2017, CT abdomen pelvis 08/20/2017. 02/10/2010 MRI FINDINGS: CT CHEST FINDINGS Cardiovascular: Normal branch pattern of the great vessels with  atherosclerosis identified within the right brachiocephalic and left subclavian artery origins. Mild aortic atherosclerosis at the arch without aneurysm. Dilatation of the main pulmonary artery up to 2.9 cm consistent with chronic pulmonary hypertension is noted. Heart is normal in size. There is minimal pericardial fluid or thickening anteriorly. Mediastinum/Nodes: Enlarged supraclavicular adenopathy as before, the largest on the left measuring 2 cm short axis with superior mediastinal adenopathy also noted, the largest is right paratracheal measuring 1.1 cm with prevascular lymph nodes measuring up to 1 cm short axis. Trachea and mainstem bronchi appear patent. No thyromegaly. Normal-appearing esophagus. Lungs/Pleura: New small bilateral pleural effusions with adjacent compressive atelectasis. Subpleural blebs are seen in the upper lobes right greater than left. Musculoskeletal: No chest wall mass or suspicious bone lesions identified. CT ABDOMEN PELVIS FINDINGS Hepatobiliary: A hypodense lesion with central calcification measuring 6.5 x 6.9 x 9.6 cm is again seen at the right hepatic dome with central calcification measuring 2.8 cm in diameter. Reportedly this was felt to represent a hemangioma on prior MRI from 2011. No biliary dilatation. No calcified gallstones. Pancreas: Unremarkable.  No pancreatic ductal dilatation or surrounding inflammatory changes. Spleen: The spleen is enlarged measuring 14.2 x 10.6 x 14.1 cm (volume = 1110 cm^3). No focal masses. Adrenals/Urinary Tract: Pain percutaneous nephrostomy tube is seen within the right renal collecting system with chronic perinephric fat stranding. No obstructive uropathy or nephrolithiasis is currently noted. Normal adrenal glands. Stomach/Bowel: Decompressed stomach with normal small bowel rotation. Enteric contrast is seen within distal ileum and large bowel to the level of the mid sigmoid colon. There is extensive descending and sigmoid diverticulosis  without acute diverticulitis. Vascular/Lymphatic: Moderate aortoiliac atherosclerosis without aneurysm. Once again, retroperitoneal para-aortic and retrocaval lymphadenopathy is again seen, largest measuring up to 2 cm short axis to the left of the aorta and 1.8 cm short axis to the right. Reproductive: Calcified uterine fibroids are noted. No adnexal masses. Other: New moderate third spacing of fluid with indurated subcutaneous soft tissues of the lower chest, abdomen or pelvis. New small to moderate free fluid is seen within the lower abdomen and pelvis. Musculoskeletal: Grade 1 anterolisthesis of L4 and L5 attributable to facet arthropathy. There is lower lumbar degenerative facet arthropathy L3 through S1. No acute nor suspicious osseous abnormalities. IMPRESSION: 1. Supraclavicular, mediastinal, and retroperitoneal adenopathy is again noted consistent with lymphoma, metastasis or lymphoproliferative disorder. 2. Ill-defined hypodense right hepatic dome mass measuring 6.5 x 6.9 x 9.6 cm is again noted with central 2.8 cm calcification, noted to be behaving like a hemangioma on MRI from 2011. 3. Stable splenomegaly. 4. New third spacing of fluid in the subcutaneous soft tissues of the lower chest, abdomen and pelvis with small to moderate amount of free fluid in the pelvis and new bilateral pleural effusions with compressive atelectasis. 5. Calcified uterine fibroids. 6. Decompression of previously noted hydronephrosis on the right by percutaneous nephrostomy tube. No obstructing calculus in the distal right ureter. Given adenopathy, extrinsic compression from adenopathy may have contributed to the hydronephrosis and hydroureter. 7. Mild lower lumbar facet arthropathy. No acute nor suspicious osseous abnormalities. Electronically Signed   By: Ashley Royalty M.D.   On: 09/02/2017 23:59    Scheduled Meds: . allopurinol  300 mg Oral Daily  . dextromethorphan-guaiFENesin  1 tablet Oral BID  . feeding supplement  1  Container Oral TID BM  . pantoprazole  40 mg Oral Daily  . phosphorus  500 mg Oral TID  . potassium chloride  40 mEq Oral BID  . predniSONE  60 mg Oral Q breakfast  . simvastatin  20 mg Oral Daily   Continuous Infusions: . sodium chloride    . [START ON 09/04/2017] ceFEPime (MAXIPIME) IV    . magnesium sulfate 1 - 4 g bolus IVPB 2 g (09/03/17 0922)    LOS: 14 days   Kerney Elbe, DO [facility] Pager [phone]  If 7PM-7AM, please contact night-coverage [email or web address] Password [facility] 09/03/2017, 2:53 PM

## 2017-09-03 NOTE — Progress Notes (Addendum)
Kristina Torres   DOB:12-21-1945   EY#:[id number]   DJS#:[id number]  Hematology and Oncology service follow up note   Subjective: Pt's more SOB lately, and has had loose BM since two days ago, 5-6 times a day. No fever. WBC significantly increased over the past 2 days, on cefepime now, ID consulted.   Objective:  Vitals:   09/03/17 1245 09/03/17 1402  BP: (!) 93/41 (!) 95/51  Pulse: 88 92  Resp: 18   Temp: 98.3 F (36.8 C) 98.6 F (37 C)  SpO2: 92% 93%    Body mass index is 43.16 kg/m.  Intake/Output Summary (Last 24 hours) at 09/03/17 1754 Last data filed at 09/03/17 1400  Gross per 24 hour  Intake          2150.92 ml  Output             1626 ml  Net           524.92 ml     Sclerae unicteric  Oropharynx clear  No peripheral adenopathy In her neck, supraclavicular, axilla or groin area  Lungs clear -- no rales or rhonchi  Heart regular rate and rhythm  Abdomen benign  MSK no focal spinal tenderness, no peripheral edema  Neuro nonfocal, somnolent    CBG (last 3)  No results for input(s): GLUCAP in the last 72 hours.   Labs:  Urine Studies No results for input(s): UHGB, CRYS in the last 72 hours.  Invalid input(s): UACOL, UAPR, USPG, UPH, UTP, UGL, UKET, UBIL, UNIT, UROB, [place], UEPI, UWBC, Duwayne Heck [place], [place]  Basic Metabolic Panel:  Recent Labs Lab 08/30/17 0316 08/31/17 0546 09/01/17 0329 09/02/17 0520 09/03/17 0326  NA 129* 130* 132* 131* 132*  K 3.7 4.2 3.8 3.1* 4.0  CL 100* 101 102 98* 99*  CO2 23 21* _0 GLUCOSE 94 98 84 111* 104*  BUN 21* 15 20 25* 25*  CREATININE 1.02* 1.11* 1.35* 1.56* 1.19*  CALCIUM 7.1* 6.9* 6.5* 6.1* 6.3*  MG 1.7 1.5* 1.7 1.5* 1.6*  PHOS 1.6* 1.8* 3.5 4.6 4.0   GFR Estimated Creatinine Clearance: 43.4 mL/min (A) (by C-G formula based on SCr of 1.19 mg/dL (H)). Liver Function Tests:  Recent Labs Lab 08/30/17 0316 08/31/17 0546 09/01/17 0329 09/02/17 0520 09/03/17 0326  AST 50* 55* 54* 73* 75*  ALT 39  39 37 34 29  ALKPHOS 173* 159* 138* 140* 125  BILITOT 2.3* 2.4* 2.2* 2.8* 3.0*  PROT 3.6* 3.4* 3.4* 3.3* 3.2*  ALBUMIN 1.7* 1.6* 1.6* 1.5* 1.5*   No results for input(s): LIPASE, AMYLASE in the last 168 hours. No results for input(s): AMMONIA in the last 168 hours. Coagulation profile  Recent Labs Lab 08/30/17 1447 09/03/17 1018  INR 1.23 1.19    CBC:  Recent Labs Lab 08/30/17 0316  08/31/17 1316 09/01/17 0329 09/02/17 0520 09/03/17 0326 09/03/17 1018  WBC 7.1  --  13.2* 18.1* 34.3* 27.9*  --   NEUTROABS 4.5  --  9.2* 9.4* 20.6* 18.5*  --   HGB 8.2*  --  8.6* 7.3* 9.1* 8.6*  --   HCT 24.7*  --  25.6* 21.3* 26.8* 24.6*  --   MCV 97.2  --  98.1 98.2 95.0 94.3  --   PLT 76*  < > 69* 62* 63* 49* 47*  < > = values in this interval not displayed. Cardiac Enzymes: No results for input(s): CKTOTAL, CKMB, CKMBINDEX, TROPONINI in the last 168 hours. BNP: Invalid input(s):  POCBNP CBG: No results for input(s): GLUCAP in the last 168 hours. D-Dimer  Recent Labs  09/03/17 1018  DDIMER 2.40*   Hgb A1c No results for input(s): HGBA1C in the last 72 hours. Lipid Profile No results for input(s): CHOL, HDL, LDLCALC, TRIG, CHOLHDL, LDLDIRECT in the last 72 hours. Thyroid function studies  Recent Labs  09/01/17 1608  TSH 4.005   Anemia work up No results for input(s): VITAMINB12, FOLATE, FERRITIN, TIBC, IRON, RETICCTPCT in the last 72 hours. Microbiology Recent Results (from the past 240 hour(s))  Culture, blood (Routine X 2) w Reflex to ID Panel     Status: None   Collection Time: 08/24/17  6:13 PM  Result Value Ref Range Status   Specimen Description BLOOD LEFT HAND  Final   Special Requests   Final    IN PEDIATRIC BOTTLE Blood Culture results may not be optimal due to an excessive volume of blood received in culture bottles   Culture NO GROWTH 5 DAYS  Final   Report Status 08/29/2017 FINAL  Final  Culture, blood (Routine X 2) w Reflex to ID Panel     Status: None    Collection Time: 08/24/17  6:14 PM  Result Value Ref Range Status   Specimen Description BLOOD RIGHT HAND  Final   Special Requests   Final    BOTTLES DRAWN AEROBIC AND ANAEROBIC Blood Culture adequate volume   Culture NO GROWTH 5 DAYS  Final   Report Status 08/29/2017 FINAL  Final  Urine Culture     Status: None   Collection Time: 08/24/17 11:01 PM  Result Value Ref Range Status   Specimen Description URINE, CLEAN CATCH  Final   Special Requests NONE  Final   Culture NO GROWTH  Final   Report Status 08/26/2017 FINAL  Final  Culture, Urine     Status: None   Collection Time: 09/01/17  9:07 AM  Result Value Ref Range Status   Specimen Description URINE, RANDOM  Final   Special Requests NONE  Final   Culture NO GROWTH  Final   Report Status 09/02/2017 FINAL  Final  Culture, blood (routine x 2)     Status: None (Preliminary result)   Collection Time: 09/01/17  9:32 AM  Result Value Ref Range Status   Specimen Description BLOOD BLOOD LEFT ARM  Final   Special Requests   Final    IN PEDIATRIC BOTTLE Blood Culture results may not be optimal due to an excessive volume of blood received in culture bottles   Culture NO GROWTH 2 DAYS  Final   Report Status PENDING  Incomplete  Culture, blood (routine x 2)     Status: None (Preliminary result)   Collection Time: 09/01/17  9:32 AM  Result Value Ref Range Status   Specimen Description BLOOD BLOOD LEFT ARM  Final   Special Requests   Final    IN PEDIATRIC BOTTLE Blood Culture results may not be optimal due to an excessive volume of blood received in culture bottles   Culture NO GROWTH 2 DAYS  Final   Report Status PENDING  Incomplete  C difficile quick scan w PCR reflex     Status: None   Collection Time: 09/03/17  2:25 PM  Result Value Ref Range Status   C Diff antigen NEGATIVE NEGATIVE Final   C Diff toxin NEGATIVE NEGATIVE Final   C Diff interpretation No C. difficile detected.  Final      Studies:  Ct Abdomen Pelvis Wo  Contrast  Result Date: 09/02/2017 CLINICAL DATA:  Leukocytosis. History of dyspnea, appendectomy and partial hysterectomy. Ureteral obstruction with nephrostomy placed on the right. EXAM: CT CHEST, ABDOMEN AND PELVIS WITHOUT CONTRAST TECHNIQUE: Multidetector CT imaging of the chest, abdomen and pelvis was performed following the standard protocol without IV contrast. COMPARISON:  CXR 09/01/2017, CT abdomen pelvis 08/20/2017. 02/10/2010 MRI FINDINGS: CT CHEST FINDINGS Cardiovascular: Normal branch pattern of the great vessels with atherosclerosis identified within the right brachiocephalic and left subclavian artery origins. Mild aortic atherosclerosis at the arch without aneurysm. Dilatation of the main pulmonary artery up to 2.9 cm consistent with chronic pulmonary hypertension is noted. Heart is normal in size. There is minimal pericardial fluid or thickening anteriorly. Mediastinum/Nodes: Enlarged supraclavicular adenopathy as before, the largest on the left measuring 2 cm short axis with superior mediastinal adenopathy also noted, the largest is right paratracheal measuring 1.1 cm with prevascular lymph nodes measuring up to 1 cm short axis. Trachea and mainstem bronchi appear patent. No thyromegaly. Normal-appearing esophagus. Lungs/Pleura: New small bilateral pleural effusions with adjacent compressive atelectasis. Subpleural blebs are seen in the upper lobes right greater than left. Musculoskeletal: No chest wall mass or suspicious bone lesions identified. CT ABDOMEN PELVIS FINDINGS Hepatobiliary: A hypodense lesion with central calcification measuring 6.5 x 6.9 x 9.6 cm is again seen at the right hepatic dome with central calcification measuring 2.8 cm in diameter. Reportedly this was felt to represent a hemangioma on prior MRI from 2011. No biliary dilatation. No calcified gallstones. Pancreas: Unremarkable. No pancreatic ductal dilatation or surrounding inflammatory changes. Spleen: The spleen is  enlarged measuring 14.2 x 10.6 x 14.1 cm (volume = 1110 cm^3). No focal masses. Adrenals/Urinary Tract: Pain percutaneous nephrostomy tube is seen within the right renal collecting system with chronic perinephric fat stranding. No obstructive uropathy or nephrolithiasis is currently noted. Normal adrenal glands. Stomach/Bowel: Decompressed stomach with normal small bowel rotation. Enteric contrast is seen within distal ileum and large bowel to the level of the mid sigmoid colon. There is extensive descending and sigmoid diverticulosis without acute diverticulitis. Vascular/Lymphatic: Moderate aortoiliac atherosclerosis without aneurysm. Once again, retroperitoneal para-aortic and retrocaval lymphadenopathy is again seen, largest measuring up to 2 cm short axis to the left of the aorta and 1.8 cm short axis to the right. Reproductive: Calcified uterine fibroids are noted. No adnexal masses. Other: New moderate third spacing of fluid with indurated subcutaneous soft tissues of the lower chest, abdomen or pelvis. New small to moderate free fluid is seen within the lower abdomen and pelvis. Musculoskeletal: Grade 1 anterolisthesis of L4 and L5 attributable to facet arthropathy. There is lower lumbar degenerative facet arthropathy L3 through S1. No acute nor suspicious osseous abnormalities. IMPRESSION: 1. Supraclavicular, mediastinal, and retroperitoneal adenopathy is again noted consistent with lymphoma, metastasis or lymphoproliferative disorder. 2. Ill-defined hypodense right hepatic dome mass measuring 6.5 x 6.9 x 9.6 cm is again noted with central 2.8 cm calcification, noted to be behaving like a hemangioma on MRI from 2011. 3. Stable splenomegaly. 4. New third spacing of fluid in the subcutaneous soft tissues of the lower chest, abdomen and pelvis with small to moderate amount of free fluid in the pelvis and new bilateral pleural effusions with compressive atelectasis. 5. Calcified uterine fibroids. 6.  Decompression of previously noted hydronephrosis on the right by percutaneous nephrostomy tube. No obstructing calculus in the distal right ureter. Given adenopathy, extrinsic compression from adenopathy may have contributed to the hydronephrosis and hydroureter. 7. Mild lower lumbar facet arthropathy. No  acute nor suspicious osseous abnormalities. Electronically Signed   By: Ashley Royalty M.D.   On: 09/02/2017 23:59   Ct Chest Wo Contrast  Result Date: 09/02/2017 CLINICAL DATA:  Leukocytosis. History of dyspnea, appendectomy and partial hysterectomy. Ureteral obstruction with nephrostomy placed on the right. EXAM: CT CHEST, ABDOMEN AND PELVIS WITHOUT CONTRAST TECHNIQUE: Multidetector CT imaging of the chest, abdomen and pelvis was performed following the standard protocol without IV contrast. COMPARISON:  CXR 09/01/2017, CT abdomen pelvis 08/20/2017. 02/10/2010 MRI FINDINGS: CT CHEST FINDINGS Cardiovascular: Normal branch pattern of the great vessels with atherosclerosis identified within the right brachiocephalic and left subclavian artery origins. Mild aortic atherosclerosis at the arch without aneurysm. Dilatation of the main pulmonary artery up to 2.9 cm consistent with chronic pulmonary hypertension is noted. Heart is normal in size. There is minimal pericardial fluid or thickening anteriorly. Mediastinum/Nodes: Enlarged supraclavicular adenopathy as before, the largest on the left measuring 2 cm short axis with superior mediastinal adenopathy also noted, the largest is right paratracheal measuring 1.1 cm with prevascular lymph nodes measuring up to 1 cm short axis. Trachea and mainstem bronchi appear patent. No thyromegaly. Normal-appearing esophagus. Lungs/Pleura: New small bilateral pleural effusions with adjacent compressive atelectasis. Subpleural blebs are seen in the upper lobes right greater than left. Musculoskeletal: No chest wall mass or suspicious bone lesions identified. CT ABDOMEN PELVIS  FINDINGS Hepatobiliary: A hypodense lesion with central calcification measuring 6.5 x 6.9 x 9.6 cm is again seen at the right hepatic dome with central calcification measuring 2.8 cm in diameter. Reportedly this was felt to represent a hemangioma on prior MRI from 2011. No biliary dilatation. No calcified gallstones. Pancreas: Unremarkable. No pancreatic ductal dilatation or surrounding inflammatory changes. Spleen: The spleen is enlarged measuring 14.2 x 10.6 x 14.1 cm (volume = 1110 cm^3). No focal masses. Adrenals/Urinary Tract: Pain percutaneous nephrostomy tube is seen within the right renal collecting system with chronic perinephric fat stranding. No obstructive uropathy or nephrolithiasis is currently noted. Normal adrenal glands. Stomach/Bowel: Decompressed stomach with normal small bowel rotation. Enteric contrast is seen within distal ileum and large bowel to the level of the mid sigmoid colon. There is extensive descending and sigmoid diverticulosis without acute diverticulitis. Vascular/Lymphatic: Moderate aortoiliac atherosclerosis without aneurysm. Once again, retroperitoneal para-aortic and retrocaval lymphadenopathy is again seen, largest measuring up to 2 cm short axis to the left of the aorta and 1.8 cm short axis to the right. Reproductive: Calcified uterine fibroids are noted. No adnexal masses. Other: New moderate third spacing of fluid with indurated subcutaneous soft tissues of the lower chest, abdomen or pelvis. New small to moderate free fluid is seen within the lower abdomen and pelvis. Musculoskeletal: Grade 1 anterolisthesis of L4 and L5 attributable to facet arthropathy. There is lower lumbar degenerative facet arthropathy L3 through S1. No acute nor suspicious osseous abnormalities. IMPRESSION: 1. Supraclavicular, mediastinal, and retroperitoneal adenopathy is again noted consistent with lymphoma, metastasis or lymphoproliferative disorder. 2. Ill-defined hypodense right hepatic dome  mass measuring 6.5 x 6.9 x 9.6 cm is again noted with central 2.8 cm calcification, noted to be behaving like a hemangioma on MRI from 2011. 3. Stable splenomegaly. 4. New third spacing of fluid in the subcutaneous soft tissues of the lower chest, abdomen and pelvis with small to moderate amount of free fluid in the pelvis and new bilateral pleural effusions with compressive atelectasis. 5. Calcified uterine fibroids. 6. Decompression of previously noted hydronephrosis on the right by percutaneous nephrostomy tube. No obstructing calculus in the  distal right ureter. Given adenopathy, extrinsic compression from adenopathy may have contributed to the hydronephrosis and hydroureter. 7. Mild lower lumbar facet arthropathy. No acute nor suspicious osseous abnormalities. Electronically Signed   By: Ashley Royalty M.D.   On: 09/02/2017 23:59    Assessment: Deyanira Fesler is a 71 y.o. female with a history of HTN, gout, HLD and Rheumatoid Arthritis on MTX, presented with severe right hydronephrosis, fatigue, nausea,.and weakness    1. Diffuse large B cell lymphoma, with bone marrow involvement, stage IV, IPS 4, high risk  2. Worsening anemia and thrombocytopenia, secondary to #1 3. Anasarca and hypoalbuminemia  4. RA, MTX on hold for now  5. HTN 6. Right hydronephrosis secondary to ureteral obstruction, status post nephrostomy tube placement 7. Severe hypercalcemia, resolved  8. Nausea and vomiting, anorexia  9. Metabolic encephalopathy, likely secondary to hypercalcemia, improved 10. HCAP, on antibiotics  11. Pulmonary edema  12. Diarrhea, c-diff negative   Plan:  -I have spoken with pathologist Dr. Gari Crown this morning, pt's bone marrow biopsy showed positive high grade lymphoma involvement, likely large B cell lymphoma. Flow cytometry and IHC studies are pending, final diagnosis likely will be made tomorrow. This is probably related to her previous immunosuppressive medication methotrexate for her  rheumatoid arthritis.  -given her high IPS, she has high risk disease, a fatal disease without treastment. With standard chemo R-CHOP, her 3 year overall survival rate is around 59% -I recommend standard chemotherapy R-CHOP for a total of 6 cycles. Due to her multiple comorbidities, especially with recent pneumonia, pulmonary edema, extremely poor performance status and a nutritionist status right now, I recommend dose reduced R-CVP for first cycle.  --Chemotherapy consent: Side effects including but does not not limited to, fatigue, nausea, vomiting, diarrhea, hair loss, neuropathy, fluid retention, renal and kidney dysfunction, neutropenic fever, needed for blood transfusion, bleeding, heart failure, severe complication such as sepsis and even death, were discussed with patient in great detail. She agrees to proceed. -the goal of therapy is curative  -will transfer pt to [facility] tonight -OK for IR to have her nephrostomy tube internalization procedure tomorrow if they can arrange. She received 1 unit of platelet transfusion today for the procedure -I plan to start Rituxan tomorrow afternoon, when we have the final diagnosis singed out  -plan to give chemo on 9/19 -I have started her on prednisone 60 mg daily for 5 days today -Continue antibiotics as per primary team  -I encourage her to continue nutrition supplement -SW on 3W for SNF arrangement  -I will follow up closely  -I communicated with Dr. Constance Holster (regarding cancel her surgical biopsy on 9/19) and Dr. Alfredia Ferguson (regarding her transferring to [facility] and coordinate her care) and Dr. Gari Crown regarding her diagnosis  -I spoke with pt's husband regarding her diagnosis, prognosis and treatment, questions were answered   I spent a total of 60 mins in her care today, >50% face to face counseling  Truitt Merle, MD 09/03/2017  5:54 PM

## 2017-09-03 NOTE — Progress Notes (Signed)
Patient ID: Kristina Torres, female   DOB: April 16, 1946, 71 y.o.   MRN: [id number]   Request for JJ placement and PCN removal  plts 36 now 13  IR MD would like PLts to be over 70 to proceed with this non emergent procedure.  We will recheck plts in am  I have made MD aware

## 2017-09-03 NOTE — Progress Notes (Signed)
Pharmacy Antibiotic Note  Kristina Torres is a 71 y.o. female who originally presented on 9/3 - now with temperature and leukocytosis concerning for infection. Pharmacy consulted 9/16 to start Cefepime for empiric coverage.  The patient's renal function has improved today - SCr 1.19, CrCl~40-50 ml/min. Will adjust the dose slightly.   Plan: 1. Adjust Cefepime to 2g IV every 24 hours 2. Will continue to follow renal function, culture results, LOT, and antibiotic de-escalation plans   Height: 4' 10.5" (148.6 cm) Weight: 210 lb 1.6 oz (95.3 kg) IBW/kg (Calculated) : 42.05  Temp (24hrs), Avg:98.3 F (36.8 C), Min:98 F (36.7 C), Max:98.7 F (37.1 C)   Recent Labs Lab 08/30/17 0316 08/31/17 0546 08/31/17 1316 09/01/17 0329 09/02/17 0520 09/03/17 0326  WBC 7.1  --  13.2* 18.1* 34.3* 27.9*  CREATININE 1.02* 1.11*  --  1.35* 1.56* 1.19*    Estimated Creatinine Clearance: 43.4 mL/min (A) (by C-G formula based on SCr of 1.19 mg/dL (H)).    Allergies  Allergen Reactions  . Streptomycin Anaphylaxis    Antimicrobials this admission: Vanc 9/7 >> 9/10 Zosyn 9/7 >> 9/11 CTX 9/3 >> 9/5 Levofloxacin 9/11 >> 9/14 Cefepime 9/16 >>   Microbiology results: 9/3 BCx: ngtd 9/3 urine: neg 9/4 urine: neg 9/7 BCx: ngtd 9/7 UCx: neg  9/15 urine NG 9/15 BC x 2 NGTD  Thank you for allowing pharmacy to be a part of this patient's care.  Alycia Rossetti, PharmD, BCPS Clinical Pharmacist Pager: [phone] Clinical phone for 09/03/2017 from 7a-3:30p: [phone] If after 3:30p, please call main pharmacy at: [phone] 09/03/2017 2:42 PM

## 2017-09-03 NOTE — Progress Notes (Addendum)
Attempted report x1 to [facility]. Left name and number     [id number] Gave report to RN at [facility]. [facility] has been called. Awaiting transportation.   Paulla Fore, RN

## 2017-09-03 NOTE — Progress Notes (Signed)
   Dr. Harl Bowie note reviewed, ECHO findings reviewed. No further cardiac recs at this time. Signed off.   Candee Furbish, MD

## 2017-09-03 NOTE — Progress Notes (Signed)
INFECTIOUS DISEASE PROGRESS NOTE  ID: Kristina Torres is a 71 y.o. female with  Principal Problem:   Nausea & vomiting Active Problems:   Rheumatoid arthritis ([place])   Essential hypertension   Gout   UTI (urinary tract infection)   Sepsis ([place])   HLD (hyperlipidemia)   Hypotension   AKI (acute kidney injury) ([place])   Hypercalcemia   Thrombocytopenia ([facility])   Cough   Macrocytic anemia   Lymphadenopathy   Hypophosphatemia   Hypomagnesemia   Hyponatremia   Acute urinary retention   Hyperbilirubinemia  Subjective: No complaints  Abtx:  Anti-infectives    Start     Dose/Rate Route Frequency Ordered Stop   09/03/17 0930  vancomycin (VANCOCIN) IVPB 1000 mg/200 mL premix  Status:  Discontinued     1,000 mg 200 mL/hr over 60 Minutes Intravenous Every 24 hours 09/02/17 1046 09/02/17 1104   09/02/17 0930  vancomycin (VANCOCIN) 2,000 mg in sodium chloride 0.9 % 500 mL IVPB  Status:  Discontinued     2,000 mg 250 mL/hr over 120 Minutes Intravenous  Once 09/02/17 0917 09/02/17 0925   09/02/17 0930  vancomycin (VANCOCIN) 1,500 mg in sodium chloride 0.9 % 500 mL IVPB  Status:  Discontinued     1,500 mg 250 mL/hr over 120 Minutes Intravenous  Once 09/02/17 0925 09/02/17 1109   09/02/17 0915  ceFEPIme (MAXIPIME) 1 g in dextrose 5 % 50 mL IVPB     1 g 100 mL/hr over 30 Minutes Intravenous Every 24 hours 09/02/17 0906     09/02/17 0915  vancomycin (VANCOCIN) 1,500 mg in sodium chloride 0.9 % 500 mL IVPB  Status:  Discontinued     1,500 mg 250 mL/hr over 120 Minutes Intravenous  Once 09/02/17 0909 09/02/17 0916   08/28/17 1815  levofloxacin (LEVAQUIN) tablet 500 mg     500 mg Oral Daily with supper 08/28/17 1804 08/31/17 1744   08/28/17 1700  levofloxacin (LEVAQUIN) 25 MG/ML solution 500 mg  Status:  Discontinued     500 mg Oral Daily with supper 08/28/17 1432 08/28/17 1804   08/25/17 0800  vancomycin (VANCOCIN) IVPB 750 mg/150 ml premix  Status:  Discontinued     750 mg 150 mL/hr  over 60 Minutes Intravenous Every 12 hours 08/24/17 1720 08/27/17 1601   08/24/17 1800  vancomycin (VANCOCIN) 2,000 mg in sodium chloride 0.9 % 500 mL IVPB     2,000 mg 250 mL/hr over 120 Minutes Intravenous  Once 08/24/17 1720 08/24/17 2036   08/24/17 1800  piperacillin-tazobactam (ZOSYN) IVPB 3.375 g  Status:  Discontinued     3.375 g 12.5 mL/hr over 240 Minutes Intravenous Every 8 hours 08/24/17 1720 08/28/17 1432   08/21/17 2000  cefTRIAXone (ROCEPHIN) 1 g in dextrose 5 % 50 mL IVPB  Status:  Discontinued     1 g 100 mL/hr over 30 Minutes Intravenous Every 24 hours 08/20/17 1957 08/22/17 1709   08/21/17 1330  ciprofloxacin (CIPRO) IVPB 400 mg     400 mg 200 mL/hr over 60 Minutes Intravenous To Radiology 08/21/17 1319 08/21/17 1430   08/20/17 1945  cefTRIAXone (ROCEPHIN) 2 g in dextrose 5 % 50 mL IVPB     2 g 100 mL/hr over 30 Minutes Intravenous  Once 08/20/17 1932 08/20/17 2242      Medications:  Scheduled: . allopurinol  300 mg Oral Daily  . dextromethorphan-guaiFENesin  1 tablet Oral BID  . feeding supplement  1 Container Oral TID BM  . pantoprazole  40  mg Oral Daily  . phosphorus  500 mg Oral TID  . polyethylene glycol  17 g Oral Daily  . potassium chloride  40 mEq Oral BID  . senna  1 tablet Oral QHS  . simvastatin  20 mg Oral Daily    Objective: Vital signs in last 24 hours: Temp:  [98.1 F (36.7 C)-98.7 F (37.1 C)] 98.3 F (36.8 C) (09/17 0936) Pulse Rate:  [88-95] 95 (09/17 0936) Resp:  [16-20] 16 (09/17 0936) BP: (94-101)/(39-66) 94/39 (09/17 0936) SpO2:  [90 %-96 %] 96 % (09/17 0936) Weight:  [95.3 kg (210 lb)-95.3 kg (210 lb 1.6 oz)] 95.3 kg (210 lb 1.6 oz) (09/17 0413)   General appearance: alert, cooperative and no distress Resp: clear to auscultation bilaterally Cardio: regular rate and rhythm GI: normal findings: bowel sounds normal and soft, non-tender Extremities: edema 3+  Lab Results  Recent Labs  09/02/17 0520 09/03/17 0326  WBC 34.3*  27.9*  HGB 9.1* 8.6*  HCT 26.8* 24.6*  NA 131* 132*  K 3.1* 4.0  CL 98* 99*  CO2 24 24  BUN 25* 25*  CREATININE 1.56* 1.19*   Liver Panel  Recent Labs  09/02/17 0520 09/03/17 0326  PROT 3.3* 3.2*  ALBUMIN 1.5* 1.5*  AST 73* 75*  ALT 34 29  ALKPHOS 140* 125  BILITOT 2.8* 3.0*   Sedimentation Rate No results for input(s): ESRSEDRATE in the last 72 hours. C-Reactive Protein No results for input(s): CRP in the last 72 hours.  Microbiology: Recent Results (from the past 240 hour(s))  Culture, blood (Routine X 2) w Reflex to ID Panel     Status: None   Collection Time: 08/24/17  6:13 PM  Result Value Ref Range Status   Specimen Description BLOOD LEFT HAND  Final   Special Requests   Final    IN PEDIATRIC BOTTLE Blood Culture results may not be optimal due to an excessive volume of blood received in culture bottles   Culture NO GROWTH 5 DAYS  Final   Report Status 08/29/2017 FINAL  Final  Culture, blood (Routine X 2) w Reflex to ID Panel     Status: None   Collection Time: 08/24/17  6:14 PM  Result Value Ref Range Status   Specimen Description BLOOD RIGHT HAND  Final   Special Requests   Final    BOTTLES DRAWN AEROBIC AND ANAEROBIC Blood Culture adequate volume   Culture NO GROWTH 5 DAYS  Final   Report Status 08/29/2017 FINAL  Final  Urine Culture     Status: None   Collection Time: 08/24/17 11:01 PM  Result Value Ref Range Status   Specimen Description URINE, CLEAN CATCH  Final   Special Requests NONE  Final   Culture NO GROWTH  Final   Report Status 08/26/2017 FINAL  Final  Culture, Urine     Status: None   Collection Time: 09/01/17  9:07 AM  Result Value Ref Range Status   Specimen Description URINE, RANDOM  Final   Special Requests NONE  Final   Culture NO GROWTH  Final   Report Status 09/02/2017 FINAL  Final  Culture, blood (routine x 2)     Status: None (Preliminary result)   Collection Time: 09/01/17  9:32 AM  Result Value Ref Range Status   Specimen  Description BLOOD BLOOD LEFT ARM  Final   Special Requests   Final    IN PEDIATRIC BOTTLE Blood Culture results may not be optimal due to an excessive volume  of blood received in culture bottles   Culture NO GROWTH < 24 HOURS  Final   Report Status PENDING  Incomplete  Culture, blood (routine x 2)     Status: None (Preliminary result)   Collection Time: 09/01/17  9:32 AM  Result Value Ref Range Status   Specimen Description BLOOD BLOOD LEFT ARM  Final   Special Requests   Final    IN PEDIATRIC BOTTLE Blood Culture results may not be optimal due to an excessive volume of blood received in culture bottles   Culture NO GROWTH < 24 HOURS  Final   Report Status PENDING  Incomplete    Studies/Results: Ct Abdomen Pelvis Wo Contrast  Result Date: 09/02/2017 CLINICAL DATA:  Leukocytosis. History of dyspnea, appendectomy and partial hysterectomy. Ureteral obstruction with nephrostomy placed on the right. EXAM: CT CHEST, ABDOMEN AND PELVIS WITHOUT CONTRAST TECHNIQUE: Multidetector CT imaging of the chest, abdomen and pelvis was performed following the standard protocol without IV contrast. COMPARISON:  CXR 09/01/2017, CT abdomen pelvis 08/20/2017. 02/10/2010 MRI FINDINGS: CT CHEST FINDINGS Cardiovascular: Normal branch pattern of the great vessels with atherosclerosis identified within the right brachiocephalic and left subclavian artery origins. Mild aortic atherosclerosis at the arch without aneurysm. Dilatation of the main pulmonary artery up to 2.9 cm consistent with chronic pulmonary hypertension is noted. Heart is normal in size. There is minimal pericardial fluid or thickening anteriorly. Mediastinum/Nodes: Enlarged supraclavicular adenopathy as before, the largest on the left measuring 2 cm short axis with superior mediastinal adenopathy also noted, the largest is right paratracheal measuring 1.1 cm with prevascular lymph nodes measuring up to 1 cm short axis. Trachea and mainstem bronchi appear  patent. No thyromegaly. Normal-appearing esophagus. Lungs/Pleura: New small bilateral pleural effusions with adjacent compressive atelectasis. Subpleural blebs are seen in the upper lobes right greater than left. Musculoskeletal: No chest wall mass or suspicious bone lesions identified. CT ABDOMEN PELVIS FINDINGS Hepatobiliary: A hypodense lesion with central calcification measuring 6.5 x 6.9 x 9.6 cm is again seen at the right hepatic dome with central calcification measuring 2.8 cm in diameter. Reportedly this was felt to represent a hemangioma on prior MRI from 2011. No biliary dilatation. No calcified gallstones. Pancreas: Unremarkable. No pancreatic ductal dilatation or surrounding inflammatory changes. Spleen: The spleen is enlarged measuring 14.2 x 10.6 x 14.1 cm (volume = 1110 cm^3). No focal masses. Adrenals/Urinary Tract: Pain percutaneous nephrostomy tube is seen within the right renal collecting system with chronic perinephric fat stranding. No obstructive uropathy or nephrolithiasis is currently noted. Normal adrenal glands. Stomach/Bowel: Decompressed stomach with normal small bowel rotation. Enteric contrast is seen within distal ileum and large bowel to the level of the mid sigmoid colon. There is extensive descending and sigmoid diverticulosis without acute diverticulitis. Vascular/Lymphatic: Moderate aortoiliac atherosclerosis without aneurysm. Once again, retroperitoneal para-aortic and retrocaval lymphadenopathy is again seen, largest measuring up to 2 cm short axis to the left of the aorta and 1.8 cm short axis to the right. Reproductive: Calcified uterine fibroids are noted. No adnexal masses. Other: New moderate third spacing of fluid with indurated subcutaneous soft tissues of the lower chest, abdomen or pelvis. New small to moderate free fluid is seen within the lower abdomen and pelvis. Musculoskeletal: Grade 1 anterolisthesis of L4 and L5 attributable to facet arthropathy. There is lower  lumbar degenerative facet arthropathy L3 through S1. No acute nor suspicious osseous abnormalities. IMPRESSION: 1. Supraclavicular, mediastinal, and retroperitoneal adenopathy is again noted consistent with lymphoma, metastasis or lymphoproliferative disorder. 2. Ill-defined  hypodense right hepatic dome mass measuring 6.5 x 6.9 x 9.6 cm is again noted with central 2.8 cm calcification, noted to be behaving like a hemangioma on MRI from 2011. 3. Stable splenomegaly. 4. New third spacing of fluid in the subcutaneous soft tissues of the lower chest, abdomen and pelvis with small to moderate amount of free fluid in the pelvis and new bilateral pleural effusions with compressive atelectasis. 5. Calcified uterine fibroids. 6. Decompression of previously noted hydronephrosis on the right by percutaneous nephrostomy tube. No obstructing calculus in the distal right ureter. Given adenopathy, extrinsic compression from adenopathy may have contributed to the hydronephrosis and hydroureter. 7. Mild lower lumbar facet arthropathy. No acute nor suspicious osseous abnormalities. Electronically Signed   By: Ashley Royalty M.D.   On: 09/02/2017 23:59   Dg Chest 1 View  Result Date: 09/01/2017 CLINICAL DATA:  Shortness of breath EXAM: CHEST 1 VIEW COMPARISON:  August 30, 2017 FINDINGS: Continued patchy infiltrate in the left base, similar in the interval. No pneumothorax. The cardiomediastinal silhouette is stable. No other interval changes. A nodular density projected over the right upper lung laterally is likely artifactual as no nodule was seen in this region on the recent chest CT. IMPRESSION: 1. Persistent stable patchy infiltrate in the left base. 2. The nodular density over the lateral right apex may be artifactual as it was not seen on the recent CT scan. Recommend attention to this region on follow-up. Electronically Signed   By: Dorise Bullion III M.D   On: 09/01/2017 11:04   Ct Chest Wo Contrast  Result Date:  09/02/2017 CLINICAL DATA:  Leukocytosis. History of dyspnea, appendectomy and partial hysterectomy. Ureteral obstruction with nephrostomy placed on the right. EXAM: CT CHEST, ABDOMEN AND PELVIS WITHOUT CONTRAST TECHNIQUE: Multidetector CT imaging of the chest, abdomen and pelvis was performed following the standard protocol without IV contrast. COMPARISON:  CXR 09/01/2017, CT abdomen pelvis 08/20/2017. 02/10/2010 MRI FINDINGS: CT CHEST FINDINGS Cardiovascular: Normal branch pattern of the great vessels with atherosclerosis identified within the right brachiocephalic and left subclavian artery origins. Mild aortic atherosclerosis at the arch without aneurysm. Dilatation of the main pulmonary artery up to 2.9 cm consistent with chronic pulmonary hypertension is noted. Heart is normal in size. There is minimal pericardial fluid or thickening anteriorly. Mediastinum/Nodes: Enlarged supraclavicular adenopathy as before, the largest on the left measuring 2 cm short axis with superior mediastinal adenopathy also noted, the largest is right paratracheal measuring 1.1 cm with prevascular lymph nodes measuring up to 1 cm short axis. Trachea and mainstem bronchi appear patent. No thyromegaly. Normal-appearing esophagus. Lungs/Pleura: New small bilateral pleural effusions with adjacent compressive atelectasis. Subpleural blebs are seen in the upper lobes right greater than left. Musculoskeletal: No chest wall mass or suspicious bone lesions identified. CT ABDOMEN PELVIS FINDINGS Hepatobiliary: A hypodense lesion with central calcification measuring 6.5 x 6.9 x 9.6 cm is again seen at the right hepatic dome with central calcification measuring 2.8 cm in diameter. Reportedly this was felt to represent a hemangioma on prior MRI from 2011. No biliary dilatation. No calcified gallstones. Pancreas: Unremarkable. No pancreatic ductal dilatation or surrounding inflammatory changes. Spleen: The spleen is enlarged measuring 14.2 x 10.6 x  14.1 cm (volume = 1110 cm^3). No focal masses. Adrenals/Urinary Tract: Pain percutaneous nephrostomy tube is seen within the right renal collecting system with chronic perinephric fat stranding. No obstructive uropathy or nephrolithiasis is currently noted. Normal adrenal glands. Stomach/Bowel: Decompressed stomach with normal small bowel rotation. Enteric contrast is  seen within distal ileum and large bowel to the level of the mid sigmoid colon. There is extensive descending and sigmoid diverticulosis without acute diverticulitis. Vascular/Lymphatic: Moderate aortoiliac atherosclerosis without aneurysm. Once again, retroperitoneal para-aortic and retrocaval lymphadenopathy is again seen, largest measuring up to 2 cm short axis to the left of the aorta and 1.8 cm short axis to the right. Reproductive: Calcified uterine fibroids are noted. No adnexal masses. Other: New moderate third spacing of fluid with indurated subcutaneous soft tissues of the lower chest, abdomen or pelvis. New small to moderate free fluid is seen within the lower abdomen and pelvis. Musculoskeletal: Grade 1 anterolisthesis of L4 and L5 attributable to facet arthropathy. There is lower lumbar degenerative facet arthropathy L3 through S1. No acute nor suspicious osseous abnormalities. IMPRESSION: 1. Supraclavicular, mediastinal, and retroperitoneal adenopathy is again noted consistent with lymphoma, metastasis or lymphoproliferative disorder. 2. Ill-defined hypodense right hepatic dome mass measuring 6.5 x 6.9 x 9.6 cm is again noted with central 2.8 cm calcification, noted to be behaving like a hemangioma on MRI from 2011. 3. Stable splenomegaly. 4. New third spacing of fluid in the subcutaneous soft tissues of the lower chest, abdomen and pelvis with small to moderate amount of free fluid in the pelvis and new bilateral pleural effusions with compressive atelectasis. 5. Calcified uterine fibroids. 6. Decompression of previously noted  hydronephrosis on the right by percutaneous nephrostomy tube. No obstructing calculus in the distal right ureter. Given adenopathy, extrinsic compression from adenopathy may have contributed to the hydronephrosis and hydroureter. 7. Mild lower lumbar facet arthropathy. No acute nor suspicious osseous abnormalities. Electronically Signed   By: Ashley Royalty M.D.   On: 09/02/2017 23:59     Assessment/Plan: RA- prev on MTX  LAN Leukocytosis For LN Bx on 9-19 Had BM Bx on 9-14, f/u results  For nephrostomy removal today, stent.   No change in anbx for now.   Total days of antibiotics: 14 (cefepime)         Bobby Rumpf Infectious Diseases (pager) [phone] [email or web address] 09/03/2017, 10:13 AM  LOS: 14 days

## 2017-09-03 NOTE — Progress Notes (Signed)
CRITICAL VALUE ALERT  Critical Value:  Calcium 6.3  Date & Time Notied:  0451  09/03/2017  Provider Notified: Kennon Holter  Orders Received/Actions taken: MD ordered Calcium Gluconate once IVPB

## 2017-09-03 NOTE — Care Management Important Message (Signed)
Important Message  Patient Details  Name: Kristina Torres MRN: [id number] Date of Birth: [birth year]   Medicare Important Message Given:  Yes    Nathen May 09/03/2017, 10:35 AM

## 2017-09-04 DIAGNOSIS — N135 Crossing vessel and stricture of ureter without hydronephrosis: Secondary | ICD-10-CM

## 2017-09-04 LAB — BPAM PLATELET PHERESIS
Blood Product Expiration Date: 201809172359
ISSUE DATE / TIME: 201809171214
Unit Type and Rh: 7300

## 2017-09-04 LAB — HEPATIC FUNCTION PANEL
ALK PHOS: 138 U/L — AB (ref 38–126)
ALT: 29 U/L (ref 14–54)
AST: 34 U/L (ref 15–41)
Albumin: 1.7 g/dL — ABNORMAL LOW (ref 3.5–5.0)
BILIRUBIN DIRECT: 1 mg/dL — AB (ref 0.1–0.5)
BILIRUBIN TOTAL: 2.8 mg/dL — AB (ref 0.3–1.2)
Indirect Bilirubin: 1.8 mg/dL — ABNORMAL HIGH (ref 0.3–0.9)
TOTAL PROTEIN: 3.5 g/dL — AB (ref 6.5–8.1)

## 2017-09-04 LAB — CBC WITH DIFFERENTIAL/PLATELET
BAND NEUTROPHILS: 0 %
BASOS PCT: 0 %
BLASTS: 0 %
Basophils Absolute: 0 10*3/uL (ref 0.0–0.1)
EOS ABS: 0 10*3/uL (ref 0.0–0.7)
Eosinophils Relative: 0 %
HEMATOCRIT: 25.2 % — AB (ref 36.0–46.0)
Hemoglobin: 8.9 g/dL — ABNORMAL LOW (ref 12.0–15.0)
LYMPHS PCT: 13 %
Lymphs Abs: 2.7 10*3/uL (ref 0.7–4.0)
MCH: 33.5 pg (ref 26.0–34.0)
MCHC: 35.3 g/dL (ref 30.0–36.0)
MCV: 94.7 fL (ref 78.0–100.0)
MONOS PCT: 7 %
Metamyelocytes Relative: 0 %
Monocytes Absolute: 1.5 10*3/uL — ABNORMAL HIGH (ref 0.1–1.0)
Myelocytes: 1 %
NEUTROS ABS: 14.2 10*3/uL — AB (ref 1.7–7.7)
NEUTROS PCT: 67 %
NRBC: 0 /100{WBCs}
OTHER: 12 %
Platelets: 62 10*3/uL — ABNORMAL LOW (ref 150–400)
Promyelocytes Absolute: 0 %
RBC: 2.66 MIL/uL — ABNORMAL LOW (ref 3.87–5.11)
RDW: 21.3 % — ABNORMAL HIGH (ref 11.5–15.5)
WBC: 20.9 10*3/uL — AB (ref 4.0–10.5)

## 2017-09-04 LAB — PREPARE PLATELET PHERESIS: UNIT DIVISION: 0

## 2017-09-04 LAB — ABO/RH: ABO/RH(D): B NEG

## 2017-09-04 LAB — TYPE AND SCREEN
ABO/RH(D): B NEG
Antibody Screen: NEGATIVE

## 2017-09-04 LAB — BASIC METABOLIC PANEL
Anion gap: 10 (ref 5–15)
BUN: 28 mg/dL — AB (ref 6–20)
CHLORIDE: 101 mmol/L (ref 101–111)
CO2: 24 mmol/L (ref 22–32)
CREATININE: 0.96 mg/dL (ref 0.44–1.00)
Calcium: 6.8 mg/dL — ABNORMAL LOW (ref 8.9–10.3)
GFR calc Af Amer: >60 mL/min (ref >60)
GFR calc non Af Amer: 58 mL/min — ABNORMAL LOW (ref >60)
GLUCOSE: 157 mg/dL — AB (ref 65–99)
POTASSIUM: 3.7 mmol/L (ref 3.5–5.1)
SODIUM: 135 mmol/L (ref 135–145)

## 2017-09-04 LAB — CALCIUM, IONIZED: Calcium, Ionized, Serum: 4 mg/dL — ABNORMAL LOW (ref 4.5–5.6)

## 2017-09-04 MED ORDER — SODIUM CHLORIDE 0.9 % IV SOLN
Freq: Once | INTRAVENOUS | Status: AC
  Administered 2017-09-04: 14:00:00 via INTRAVENOUS

## 2017-09-04 MED ORDER — ALTEPLASE 2 MG IJ SOLR: 2.0000 mg | Freq: Once | INTRAMUSCULAR | Status: DC | PRN

## 2017-09-04 MED ORDER — FAMOTIDINE IN NACL 20-0.9 MG/50ML-% IV SOLN
20.0000 mg | Freq: Once | INTRAVENOUS | Status: DC | PRN
Start: 2017-09-04 — End: 2017-09-08
  Filled 2017-09-04: qty 50

## 2017-09-04 MED ORDER — ALBUTEROL SULFATE (2.5 MG/3ML) 0.083% IN NEBU: 2.5000 mg | INHALATION_SOLUTION | Freq: Once | RESPIRATORY_TRACT | Status: DC | PRN

## 2017-09-04 MED ORDER — SODIUM CHLORIDE 0.9% FLUSH: 10.0000 mL | INTRAVENOUS | Status: DC | PRN

## 2017-09-04 MED ORDER — EPINEPHRINE PF 1 MG/10ML IJ SOSY
0.2500 mg | PREFILLED_SYRINGE | Freq: Once | INTRAMUSCULAR | Status: DC | PRN
Start: 2017-09-04 — End: 2017-09-08

## 2017-09-04 MED ORDER — SODIUM CHLORIDE 0.9% FLUSH: 3.0000 mL | INTRAVENOUS | Status: DC | PRN

## 2017-09-04 MED ORDER — RITUXIMAB CHEMO INJECTION 500 MG/50ML: 375.0000 mg/m2 | Freq: Once | INTRAVENOUS | Status: DC

## 2017-09-04 MED ORDER — METHYLPREDNISOLONE SODIUM SUCC 125 MG IJ SOLR
125.0000 mg | Freq: Once | INTRAMUSCULAR | Status: AC | PRN
  Administered 2017-09-04: 125 mg via INTRAVENOUS

## 2017-09-04 MED ORDER — EPINEPHRINE PF 1 MG/ML IJ SOLN: 0.5000 mg | Freq: Once | INTRAMUSCULAR | Status: DC | PRN

## 2017-09-04 MED ORDER — DIPHENHYDRAMINE HCL 50 MG/ML IJ SOLN
25.0000 mg | Freq: Once | INTRAMUSCULAR | Status: DC | PRN
Start: 2017-09-04 — End: 2017-09-08

## 2017-09-04 MED ORDER — SODIUM CHLORIDE 0.9 % IV SOLN
700.0000 mg | Freq: Once | INTRAVENOUS | Status: AC
  Administered 2017-09-04: 700 mg via INTRAVENOUS
  Filled 2017-09-04: qty 50

## 2017-09-04 MED ORDER — HEPARIN SOD (PORK) LOCK FLUSH 100 UNIT/ML IV SOLN: 250.0000 [IU] | Freq: Once | INTRAVENOUS | Status: DC | PRN

## 2017-09-04 MED ORDER — DIPHENOXYLATE-ATROPINE 2.5-0.025 MG PO TABS
2.0000 | ORAL_TABLET | Freq: Once | ORAL | Status: AC
  Administered 2017-09-04: 2 via ORAL
  Filled 2017-09-04: qty 2

## 2017-09-04 MED ORDER — SODIUM CHLORIDE 0.9 % IV SOLN
Freq: Once | INTRAVENOUS | Status: DC | PRN
Start: 2017-09-04 — End: 2017-09-08

## 2017-09-04 MED ORDER — ACETAMINOPHEN 325 MG PO TABS
650.0000 mg | ORAL_TABLET | Freq: Once | ORAL | Status: AC
  Administered 2017-09-04: 650 mg via ORAL
  Filled 2017-09-04: qty 2

## 2017-09-04 MED ORDER — HEPARIN SOD (PORK) LOCK FLUSH 100 UNIT/ML IV SOLN
500.0000 [IU] | Freq: Once | INTRAVENOUS | Status: DC | PRN
  Filled 2017-09-04: qty 5

## 2017-09-04 MED ORDER — DIPHENHYDRAMINE HCL 50 MG/ML IJ SOLN
50.0000 mg | Freq: Once | INTRAMUSCULAR | Status: AC | PRN
  Administered 2017-09-04: 50 mg via INTRAVENOUS
  Filled 2017-09-04: qty 1

## 2017-09-04 MED ORDER — DIPHENHYDRAMINE HCL 50 MG PO CAPS
50.0000 mg | ORAL_CAPSULE | Freq: Once | ORAL | Status: AC
  Administered 2017-09-04: 50 mg via ORAL
  Filled 2017-09-04: qty 1

## 2017-09-04 NOTE — Progress Notes (Signed)
Patient ID: Kristina Torres, female   DOB: 05/17/1946, 71 y.o.   MRN: [id number]  Interventional Radiology:  Note made of consideration to try to place antegrade ureteral stent on right via nephrostomy access.  The nephrostogram performed last Friday shows a very high grade obstruction of ureter in upper pelvis, and there would be very little chance of traversing the obstruction currently.    I would recommend waiting until the patient has received some chemotherapy before attempting ureteral stent placement to avoid multiple attempts.  Kristina Torres. Kathlene Cote, M.D Pager:  [phone]

## 2017-09-04 NOTE — Progress Notes (Signed)
PROGRESS NOTE    Kristina Torres  ZOX:[id number] DOB: October 01, 1946 DOA: 08/20/2017 PCP: [name], MD   Brief Narrative:  Kristina Torres is a 71 y.o. female, PMH of HTN, HLD, gout, RA on MTX and renal mass presented to ED on 08/20/17 with complaints of nausea, nonbloody emesis, RUQ abdominal pain, lightheadedness, generalized weakness and cough. She was seen at [facility] on Tuesday prior to admission and found to have "right renal mass on ultrasound with blockage", started on oral ciprofloxacin for UTI and was supposed to see Urologist on Thursday with a CT scan. In the ED, transiently hypotensive, noted to have acute kidney injury with creatinine of 2.08, hypercalcemia with calcium of 13.87, oxygen saturation 90% on room air and CT abdomen demonstrated right hepatic lobe with hypodense mass, moderate retroperitoneal bulky lymphadenopathy, splenomegaly, severe right hydronephrosis and hydroureter. Urology consulted. IR placed right percutaneous nephrostomy. Oncology consulted and underwent retroperitoneal lymph node biopsy 9/6, preliminary pathology shows malignancy, suspicious for lymphoma but inadequate sample and hence oncology had IR do additional biopsy on 9/7-results showed ? Highgrade Lymphoma. Hypercalcemia resolved. Mental status has improved. Patient still feels weak.   Patient stated that Nephrostomy tube stopped working so IR was consulted and ordered a Nephrostogram and it was replaced 9/14. Dr. Burr Medico of Oncology re-evaluated and because cannot definitively call LNBiopsy High grade Lymphoma, FISH panel was ordered and Surgical Biopsy would be necessary for definitive Diagnosis. CT of Neck to evaluate adenopathy ordered by Heme/Onc and Dr. Burr Medico spoke with Dr. Constance Holster who will do surgical biopsy of Left Supraclavicular LN. Patient underwent Bone Marrow Biopsy 9/14 and Dr. Burr Medico spoke with Dr. Gari Crown of Pathology who states the flow and Core Biopsy will be Monday.  Cardiology evaluated  and recommending IV Lasix 20 mg q6h, addressing Nutritional Status, and Checking LE [place] for DVT's. Dr. Burr Medico spoke with Dr. Alyson Ingles of Urology and patient to undergo cystoscopy and ureter stenting the same day as LN Biopsy. Urology Dr. Karsten Ro evaluated patient 9/15 and recommended there was no indication for cystoscopy and recommended IR internalize Nephrostomy Tube with Double-J stenting during Hospitalization and will be done on Monday 09/03/17.   On 9/15 Patient's U/S of LE showed no DVT and Cardiology recommended continuing lasix with close eye on urine. Patient's Hb/Hct dropped so she was transfused 1 more unit of pRBC's. Leukocytosis rose again so she was pan-cultured to r/o another infection. Nutritionist consulted and recommended Boost Breeze TID.  9/16 Patient's WBC increased so she was ordered a CT Chest/Abd/Pelvis, Started on Empiric Cefepime (Vanc was auto discontinued), and ID was consulted. Will check for C Diff given patient's loose stools and recent Abx usage. Plan was to undergo Right PCN by IR but patient's Platelet Count was low and IR wanted Platelet Count to be above 70 for procedure. Will transfuse 1 pool of platelets. Discussed with Dr. Burr Medico in Hematology/Oncology and Bone Marrow Biopsy showed Active Diffuse Large B Cell Lymphoma so plan is to transfer to [facility] to start Chemotherapy.   Assessment & Plan:   Principal Problem:   Nausea & vomiting Active Problems:   Rheumatoid arthritis ([place])   Essential hypertension   Gout   UTI (urinary tract infection)   Sepsis (HCC)   HLD (hyperlipidemia)   Hypotension   AKI (acute kidney injury) ([place])   Hypercalcemia   Thrombocytopenia (HCC)   Cough   Macrocytic anemia   Lymphadenopathy   Hypophosphatemia   Hypomagnesemia   Hyponatremia   Acute urinary retention   Hyperbilirubinemia  DLBCL (diffuse large B cell lymphoma) (HCC)   Cytopenia  Adenopathy from Diffuse Large B Cell Lymphoma -Suspicious for Lymphoma -CT Neck ordered  by Oncology and showed Multiple enlarged left supraclavicular lymph nodes as previously seen.  The no evidence of lymphadenopathy in the upper left neck or right neck.  10 mm low-density left paraglottic focus, favored to reflect a partially fluid-filled internal laryngocele although a small neoplasm is difficult to exclude on this unenhanced study. Small left pleural effusion. Emphysema  -ENT Dr. Constance Holster was consulted and will perform surgical excision of supraclavicular LN to work up for Lymphoma but will be cancelled now that Bone Marrow Biopsy shows Active Diffuse Large B Cell Lymphoma -Pt transferred to [facility] per oncology recommendation. -Further Care per Oncology   Severe right hydronephrosis and hydroureter:  -Urology consulted 9/4 and recommended nephrostomy tube placement by IR which was done same day. Urine cultures negative.  - Discussed with consulting Urologist on 9/7, recommends DC with percutaneous nephrostomy until outpatient follow-up with Dr. Karsten Ro regarding further evaluation including cystoscopy etc. He indicated that bladder mass could well be lymphoma. -Discussed with Urology for Inpatient Cystoscopy and possible Biopsy; Discussed with Dr. Alyson Ingles and he states cystoscopy is not possible in the hospital however after Dr. Ernestina Penna discussion patient to undergo cystoscopy and ureter stenting on the same day of LN Biopsy -Right PCN stopped draining so IR re-consulted and Patient to underwent Nephrostogram Nephrostomy Tube Exchange 9/14 -Urology Dr. Karsten Ro evaluated and recommends no Cystoscopy but internalizing Nephrostomy tube with Double J Stenting was to be done today but patient's Platelet Count was too low so IR wanted Platelet Count to be above 70; Maybe difficult traversing the distal ureteral obstruction during antegrade nephro-ureteral study.  - Nephrostomy tube not placed by IR due to high grade obstruction. It is felt that with chemotherapy and after obstruction has decreased  Dr. Kathlene Cote will try again.   Acute Urinary Retention -Foley Catheter Placed as Bladder Scan showed >511 mL of Urine -Will need follow up with Urology as an outpatient and will need to follow up with Dr. Karsten Ro as an outpatient.    Nausea and vomiting:  - Secondary to obstructive uropathy and acute kidney injury. - Resolved.   Hypercalcemia, improved -May be secondary to underlying malignancy/lymphoma complicated by use of Tums, HCTZ, calcium and vitamin D supplements at home which have been held.  -Corrected calcium was as high as 14.5 on 9/6.  -Treated with IV normal saline, IV Lasix, a dose of pamidronate 60 mg on 6/5, IM calcitonin 4 doses. Vitamin D, 25-hydroxy: 30.2. PTH related peptide: <2.0  PTH: 16.  - Resolved. - No was Hypocalcemic so given 1 gram of Calcium Glucoante - Ordered Ionized calcium ordered and pending   Anemia:  -As per oncology, likely related to methotrexate and anemia of chronic disease.  -Anemia panel: Iron 29, TIBC 214, saturation 14, ferritin 745, folate 9.6, B12: 767.  - As per oncology, transfuse to maintain hgb >57m/dL. -S/p Transfusion of  2u PRBC, one 9/10 and one 9/15. This seemed to improve mentation considerably. - CBC showed Hb/Hct was 8.6/24.6 - Hold methotrexate per oncology - Patient underwent Bone Marrow Biopsy  Thrombocytopenia:  -Worsening. Platelet Count went from 100 -> 80 -> 63 -> 74 -> 69 -> 62 -> 63 -> 49 -> 47 -Stable without bleeding. As per oncology, likely related to methotrexate or autoimmune mediated but now suspect from B Cell Lymphoma -Will need to rule out HIT so will send Ab  Pending  -Bone Marrow Biopsy done 08/31/17 with Flow and Core Biopsy done Monday which showed Active B Cell Lymphoma -DIC Panel ordered by Hematology and showed Elevated D-Dimer at 1.28 and Fibrinogen Level  -Continue to Monitor and discussed with Oncology and appreciate Recc's -[id number] ordered to r/o TTP was 44.3% -Patient to be transferred to  [facility] to start Chemotherapy -Dr. Burr Medico in Oncology will start Prednisone tonight -Ordered 1 pool of platelet for patient in anticipation of Internalization Nephrostomy Tube.   Hypokalemia:  -Improved; K+ was 3.7 on last check. -Continue to Monitor and Replete as Necessary -Repeat CMP in AM    Elevated lactate:  -Likely due to dehydration and acute kidney injury. Improved.  AKI:  -SCr 2.08 on admission up from baseline of 0.9 - 1.0 due to obstructive uropathy.  - Improved with PCN but started worsening . - BUN/Cr now went from 15/1.11 -> 20/1.35 -> 25/1.56 -> 25/1.19 -IV Lasix Discontinued;  -Serum creatinine trending down and stable.  Acute hypoxic respiratory failure:  - Due to pneumonia as below. - Resolved with treatments as below  HCAP, Lobar Pneumonia (left mid and lower lung):  -CT chest shows mild posterior lower lobe bronchiectasis with associated consolidation. Developed fever of 101F on 9/7. Cultures drawn and initiated IV vancomycin and Zosyn.  -Pro-calcitonin 1.48 -> 1.67 -> 2.03 ->1.61 . Repeat blood cultures from 9/7: Negative to date.  -Chest x-ray 9/8 confirmed pneumonia.  - Since cultures continue to be negative, DC'ed vancomycin. Continues to improve, so will transition zosyn > po Levaquin which has now D/Cd - Afebrile. WBC increased to 18.1 -CXR 9/13 showed Decreased pulmonary interstitial edema or infiltrates bilaterally may reflect improving CHF or less likely interstitial pneumonia. Persistent atelectasis or scarring at the left base. Thoracic aortic atherosclerosis -Repeat CT Chest showed New third spacing of fluid in the subcutaneous soft tissues of the lower chest, abdomen and pelvis with small to moderate amount of free fluid in the pelvis and new bilateral pleural effusions with compressive atelectasis. Also showed Mediastinal Adenopathy  Essential Hypertension:  - BP soft off antihypertensive medications.  Hyperlipidemia:  - Chronic, stable -  Continue statin  Liver mass:  -CT abdomen showed vague hypodense mass with central calcification in the right hepatic lobe presumably corresponding to the MRI mass from 2011 which was thought to represent hemangioma.  -CT also showed moderate retroperitoneal somewhat bulky lymphadenopathy and splenomegaly raising concerns for metastatic disease or lymphoproliferative abnormality such as lymphoma.  -Oncology consulted 9/4 and indicated that the liver lesion is not suspicious for malignancy.  -As per oncology, based on initial RP lymph node biopsy, lymphoma is suspected but inadequate tissue sample and findings could also be due to lymphoproliferative disorder secondary to MTX or RA. Second biopsy off left [place] node done on 9/7, results showed ? Highgrade Lymphoma. - Dr. Burr Medico is coordinating with pathology regarding results and will follow-up patient after discussion with Pathologist and bone marrow biopsy showed Active Lymphoma  -Could Account for mildly elevated AST  Rheumatoid arthritis: Chronic, stable.  - Stopped methotrexate - Will need rheumatology follow up  Urethral/bladder mass:  - As per urology consultation, outpatient cystoscopy no longer necessary -Lymphoma now diagnosed by Oncology  -Plan As Above  Acute Metabolic Encephalopathy, improved  -Likely related to acute kidney injury and hypercalcemia (likely the main cause) complicating underlying possible mild dementia. No focal deficits. CT head without acute findings.  - Avoid sedating medications. Encouraged sleep hygiene.  - Consider neurocognitive testing as outpatient  Fall:  -Sustained a few days ago in her hospital room. No injury sustained.  - As per PT, SNF at time of discharge.  Nutrition:  - Very Poor oral intake as per family.  - Dietitian consulted. And recommended Boost Breeze TID - Encouraged oral intake. May be slowly increasing. - Albumin is low at 1.5  Hypophosphatemia -resolved. And on last check  WNL  Hypomagnesemia -Patient's Mag Level was 1.6 -Repleted with IV Mag Sulfate -Continue to Monitor and Replete Mag as Necessary -Repeat Mag Level in AM   Hyponatremia -Sodium went from 142 -> 132 -Suspect Hypervolemia Hyponatremia;  -Was Getting IV Lasix 20 mg q6h per Cards but now D/C'd -Continue to Monitor;  -Repeat CMP in AM   Hyperbilirubinemia -T Bili was 3.0 -DIC Panel being ordered by Oncology and as above -Continue to Monitor and Repeat LFT's in AM   Bilateral LE Edema suspected Acute Diastolic CHF Exacerbation vs Poor Nutritional Status r/o DVT -ECHO ordered and showed EF of 60-65% with G1DD -Strict I's/O's; Patient is -0.278 Liters -Daily Weights; Patient is +22 lbs since admission -Fluid Restrict to 1500 mL -Consulted with Cardiology and recommended IV Lasix but now stopped due to worsening Kidney Fxn -LE Dopplers Checked and Negative for DVT -Will consider IV Albumin/Lasix Combination for temporizing measures but likely all related to Malignancy  Hx of Gout -Not in Acute Flare -C/w Allopurinol  Leukocytosis -Worsening; Could be Reactive to Possible Lymphoma vs. Infection -Pan Cultured and repeat Blood Cx, CXR, and Urinalysis with Urine Cx to r/o Infection; Obtain Repeat CT Chest/Abd/Pelvis -**Patient admitted to having 3-4 Loose Stools so will Send for C Diff -Consulted Infectious Diseases Dr. Baxter Flattery for further evaluation and she suspectes leukocytois is related to underlying cause of lymphadenopathy as CBC with Diff has atypical lymphocytes. -Dr. Baxter Flattery recommends continuing Cefepime for the next 48 hours until BM biopsy results return tomorrow along with Blood Cx's ; Dr. Johnnye Sima recommends continuing Abx for Now -Continue to Monitor and repeat CBC in AM  DVT prophylaxis: SCDs Code Status: FULL CODE Family Communication: Discussed Husband and family at bedside Disposition Plan: Transfer to [facility] for Chemotherapy   Consultants:   Urology  Medical  Oncology  Interventional Radiology  Cardiology  ENT  Infectious Diseases   Procedures: Right percutaneous nephrostomy Retroperitoneal lymph node biopsy 9/6. Left supraclavicular lymph node biopsy 9/7 Right Percutaneous nephrostomy exchange 9/14 Bone Marrow Biopsy 9/14   Antimicrobials:  Anti-infectives    Start     Dose/Rate Route Frequency Ordered Stop   09/04/17 1000  ceFEPIme (MAXIPIME) 2 g in dextrose 5 % 50 mL IVPB     2 g 100 mL/hr over 30 Minutes Intravenous Every 24 hours 09/03/17 1444     09/03/17 0930  vancomycin (VANCOCIN) IVPB 1000 mg/200 mL premix  Status:  Discontinued     1,000 mg 200 mL/hr over 60 Minutes Intravenous Every 24 hours 09/02/17 1046 09/02/17 1104   09/02/17 0930  vancomycin (VANCOCIN) 2,000 mg in sodium chloride 0.9 % 500 mL IVPB  Status:  Discontinued     2,000 mg 250 mL/hr over 120 Minutes Intravenous  Once 09/02/17 0917 09/02/17 0925   09/02/17 0930  vancomycin (VANCOCIN) 1,500 mg in sodium chloride 0.9 % 500 mL IVPB  Status:  Discontinued     1,500 mg 250 mL/hr over 120 Minutes Intravenous  Once 09/02/17 0925 09/02/17 1109   09/02/17 0915  ceFEPIme (MAXIPIME) 1 g in dextrose 5 % 50 mL IVPB  Status:  Discontinued  1 g 100 mL/hr over 30 Minutes Intravenous Every 24 hours 09/02/17 0906 09/03/17 1444   09/02/17 0915  vancomycin (VANCOCIN) 1,500 mg in sodium chloride 0.9 % 500 mL IVPB  Status:  Discontinued     1,500 mg 250 mL/hr over 120 Minutes Intravenous  Once 09/02/17 0909 09/02/17 0916   08/28/17 1815  levofloxacin (LEVAQUIN) tablet 500 mg     500 mg Oral Daily with supper 08/28/17 1804 08/31/17 1744   08/28/17 1700  levofloxacin (LEVAQUIN) 25 MG/ML solution 500 mg  Status:  Discontinued     500 mg Oral Daily with supper 08/28/17 1432 08/28/17 1804   08/25/17 0800  vancomycin (VANCOCIN) IVPB 750 mg/150 ml premix  Status:  Discontinued     750 mg 150 mL/hr over 60 Minutes Intravenous Every 12 hours 08/24/17 1720 08/27/17 1601    08/24/17 1800  vancomycin (VANCOCIN) 2,000 mg in sodium chloride 0.9 % 500 mL IVPB     2,000 mg 250 mL/hr over 120 Minutes Intravenous  Once 08/24/17 1720 08/24/17 2036   08/24/17 1800  piperacillin-tazobactam (ZOSYN) IVPB 3.375 g  Status:  Discontinued     3.375 g 12.5 mL/hr over 240 Minutes Intravenous Every 8 hours 08/24/17 1720 08/28/17 1432   08/21/17 2000  cefTRIAXone (ROCEPHIN) 1 g in dextrose 5 % 50 mL IVPB  Status:  Discontinued     1 g 100 mL/hr over 30 Minutes Intravenous Every 24 hours 08/20/17 1957 08/22/17 1709   08/21/17 1330  ciprofloxacin (CIPRO) IVPB 400 mg     400 mg 200 mL/hr over 60 Minutes Intravenous To Radiology 08/21/17 1319 08/21/17 1430   08/20/17 1945  cefTRIAXone (ROCEPHIN) 2 g in dextrose 5 % 50 mL IVPB     2 g 100 mL/hr over 30 Minutes Intravenous  Once 08/20/17 1932 08/20/17 2242     Subjective: Pt has no new complaints.  No acute issues overnight.  Objective: Vitals:   09/03/17 1402 09/03/17 2125 09/04/17 0446 09/04/17 0458  BP: (!) 95/51 100/64  (!) 100/59  Pulse: 92 92  79  Resp:  20  20  Temp: 98.6 F (37 C) 98.5 F (36.9 C)  98.1 F (36.7 C)  TempSrc: Oral Oral  Oral  SpO2: 93% 93%  90%  Weight:  96.6 kg (212 lb 15.4 oz) 96.2 kg (212 lb)   Height:  5' (1.524 m)      Intake/Output Summary (Last 24 hours) at 09/04/17 1917 Last data filed at 09/04/17 0800  Gross per 24 hour  Intake                0 ml  Output              600 ml  Net             -600 ml   Filed Weights   09/03/17 0413 09/03/17 2125 09/04/17 0446  Weight: 95.3 kg (210 lb 1.6 oz) 96.6 kg (212 lb 15.4 oz) 96.2 kg (212 lb)   Examination: Physical Exam:  Constitutional: Pt in NAD. Appears calm and comfortable.  Eyes: Sclerae anicteric; Conjunctivae Non-injected ENMT: Grossly normal hearing. External ears and nose appear normal. Neck: Supple with no JVD. + lymphadenopathy Respiratory: Diminished to auscultation but no wheezing. Unlabored breathing, equal chest rise.   Cardiovascular: RRR; S1 S2; 3+ LE edemea Abdomen: Soft, NT, Distended due to body habitus. Bowel sounds present GU: Deferred. Musculoskeletal: No cyanosis; No contractures Skin: Warm and Dry. Has some ecchymosis on Upper Right  arm. 3+ LE Edema Neurologic: no facial asymmetry, answers questions appropriately. No appreciable focal deficits Psychiatric: Pleasant mood and affect. Intact judgement and insight  Data Reviewed: I have personally reviewed following labs and imaging studies  CBC:  Recent Labs Lab 08/31/17 1316 09/01/17 0329 09/02/17 0520 09/03/17 0326 09/03/17 1018 09/04/17 0823  WBC 13.2* 18.1* 34.3* 27.9*  --  20.9*  NEUTROABS 9.2* 9.4* 20.6* 18.5*  --  14.2*  HGB 8.6* 7.3* 9.1* 8.6*  --  8.9*  HCT 25.6* 21.3* 26.8* 24.6*  --  25.2*  MCV 98.1 98.2 95.0 94.3  --  94.7  PLT 69* 62* 63* 49* 47* 62*   Basic Metabolic Panel:  Recent Labs Lab 08/30/17 0316 08/31/17 0546 09/01/17 0329 09/02/17 0520 09/03/17 0326 09/04/17 0823  NA 129* 130* 132* 131* 132* 135  K 3.7 4.2 3.8 3.1* 4.0 3.7  CL 100* 101 102 98* 99* 101  CO2 23 21* _0 GLUCOSE 94 98 84 111* 104* 157*  BUN 21* 15 20 25* 25* 28*  CREATININE 1.02* 1.11* 1.35* 1.56* 1.19* 0.96  CALCIUM 7.1* 6.9* 6.5* 6.1* 6.3* 6.8*  MG 1.7 1.5* 1.7 1.5* 1.6*  --   PHOS 1.6* 1.8* 3.5 4.6 4.0  --    GFR: Estimated Creatinine Clearance: 55.8 mL/min (by C-G formula based on SCr of 0.96 mg/dL). Liver Function Tests:  Recent Labs Lab 08/31/17 0546 09/01/17 0329 09/02/17 0520 09/03/17 0326 09/04/17 0823  AST 55* 54* 73* 75* 34  ALT 39 37 34 29 29  ALKPHOS 159* 138* 140* 125 138*  BILITOT 2.4* 2.2* 2.8* 3.0* 2.8*  PROT 3.4* 3.4* 3.3* 3.2* 3.5*  ALBUMIN 1.6* 1.6* 1.5* 1.5* 1.7*   No results for input(s): LIPASE, AMYLASE in the last 168 hours. No results for input(s): AMMONIA in the last 168 hours. Coagulation Profile:  Recent Labs Lab 08/30/17 1447 09/03/17 1018  INR 1.23 1.19   Cardiac  Enzymes: No results for input(s): CKTOTAL, CKMB, CKMBINDEX, TROPONINI in the last 168 hours. BNP (last 3 results) No results for input(s): PROBNP in the last 8760 hours. HbA1C: No results for input(s): HGBA1C in the last 72 hours. CBG: No results for input(s): GLUCAP in the last 168 hours. Lipid Profile: No results for input(s): CHOL, HDL, LDLCALC, TRIG, CHOLHDL, LDLDIRECT in the last 72 hours. Thyroid Function Tests: No results for input(s): TSH, T4TOTAL, FREET4, T3FREE, THYROIDAB in the last 72 hours. Anemia Panel: No results for input(s): VITAMINB12, FOLATE, FERRITIN, TIBC, IRON, RETICCTPCT in the last 72 hours. Sepsis Labs:  Recent Labs Lab 08/29/17 0433 08/31/17 0546 09/02/17 0520  PROCALCITON 2.03 1.61 1.78    Recent Results (from the past 240 hour(s))  Culture, Urine     Status: None   Collection Time: 09/01/17  9:07 AM  Result Value Ref Range Status   Specimen Description URINE, RANDOM  Final   Special Requests NONE  Final   Culture NO GROWTH  Final   Report Status 09/02/2017 FINAL  Final  Culture, blood (routine x 2)     Status: None (Preliminary result)   Collection Time: 09/01/17  9:32 AM  Result Value Ref Range Status   Specimen Description BLOOD BLOOD LEFT ARM  Final   Special Requests   Final    IN PEDIATRIC BOTTLE Blood Culture results may not be optimal due to an excessive volume of blood received in culture bottles   Culture NO GROWTH 3 DAYS  Final   Report Status PENDING  Incomplete  Culture, blood (routine x 2)     Status: None (Preliminary result)   Collection Time: 09/01/17  9:32 AM  Result Value Ref Range Status   Specimen Description BLOOD BLOOD LEFT ARM  Final   Special Requests   Final    IN PEDIATRIC BOTTLE Blood Culture results may not be optimal due to an excessive volume of blood received in culture bottles   Culture NO GROWTH 3 DAYS  Final   Report Status PENDING  Incomplete  C difficile quick scan w PCR reflex     Status: None    Collection Time: 09/03/17  2:25 PM  Result Value Ref Range Status   C Diff antigen NEGATIVE NEGATIVE Final   C Diff toxin NEGATIVE NEGATIVE Final   C Diff interpretation No C. difficile detected.  Final    Radiology Studies: Ct Abdomen Pelvis Wo Contrast  Result Date: 09/02/2017 CLINICAL DATA:  Leukocytosis. History of dyspnea, appendectomy and partial hysterectomy. Ureteral obstruction with nephrostomy placed on the right. EXAM: CT CHEST, ABDOMEN AND PELVIS WITHOUT CONTRAST TECHNIQUE: Multidetector CT imaging of the chest, abdomen and pelvis was performed following the standard protocol without IV contrast. COMPARISON:  CXR 09/01/2017, CT abdomen pelvis 08/20/2017. 02/10/2010 MRI FINDINGS: CT CHEST FINDINGS Cardiovascular: Normal branch pattern of the great vessels with atherosclerosis identified within the right brachiocephalic and left subclavian artery origins. Mild aortic atherosclerosis at the arch without aneurysm. Dilatation of the main pulmonary artery up to 2.9 cm consistent with chronic pulmonary hypertension is noted. Heart is normal in size. There is minimal pericardial fluid or thickening anteriorly. Mediastinum/Nodes: Enlarged supraclavicular adenopathy as before, the largest on the left measuring 2 cm short axis with superior mediastinal adenopathy also noted, the largest is right paratracheal measuring 1.1 cm with prevascular lymph nodes measuring up to 1 cm short axis. Trachea and mainstem bronchi appear patent. No thyromegaly. Normal-appearing esophagus. Lungs/Pleura: New small bilateral pleural effusions with adjacent compressive atelectasis. Subpleural blebs are seen in the upper lobes right greater than left. Musculoskeletal: No chest wall mass or suspicious bone lesions identified. CT ABDOMEN PELVIS FINDINGS Hepatobiliary: A hypodense lesion with central calcification measuring 6.5 x 6.9 x 9.6 cm is again seen at the right hepatic dome with central calcification measuring 2.8 cm in  diameter. Reportedly this was felt to represent a hemangioma on prior MRI from 2011. No biliary dilatation. No calcified gallstones. Pancreas: Unremarkable. No pancreatic ductal dilatation or surrounding inflammatory changes. Spleen: The spleen is enlarged measuring 14.2 x 10.6 x 14.1 cm (volume = 1110 cm^3). No focal masses. Adrenals/Urinary Tract: Pain percutaneous nephrostomy tube is seen within the right renal collecting system with chronic perinephric fat stranding. No obstructive uropathy or nephrolithiasis is currently noted. Normal adrenal glands. Stomach/Bowel: Decompressed stomach with normal small bowel rotation. Enteric contrast is seen within distal ileum and large bowel to the level of the mid sigmoid colon. There is extensive descending and sigmoid diverticulosis without acute diverticulitis. Vascular/Lymphatic: Moderate aortoiliac atherosclerosis without aneurysm. Once again, retroperitoneal para-aortic and retrocaval lymphadenopathy is again seen, largest measuring up to 2 cm short axis to the left of the aorta and 1.8 cm short axis to the right. Reproductive: Calcified uterine fibroids are noted. No adnexal masses. Other: New moderate third spacing of fluid with indurated subcutaneous soft tissues of the lower chest, abdomen or pelvis. New small to moderate free fluid is seen within the lower abdomen and pelvis. Musculoskeletal: Grade 1 anterolisthesis of L4 and L5 attributable to facet arthropathy. There is lower lumbar  degenerative facet arthropathy L3 through S1. No acute nor suspicious osseous abnormalities. IMPRESSION: 1. Supraclavicular, mediastinal, and retroperitoneal adenopathy is again noted consistent with lymphoma, metastasis or lymphoproliferative disorder. 2. Ill-defined hypodense right hepatic dome mass measuring 6.5 x 6.9 x 9.6 cm is again noted with central 2.8 cm calcification, noted to be behaving like a hemangioma on MRI from 2011. 3. Stable splenomegaly. 4. New third spacing of  fluid in the subcutaneous soft tissues of the lower chest, abdomen and pelvis with small to moderate amount of free fluid in the pelvis and new bilateral pleural effusions with compressive atelectasis. 5. Calcified uterine fibroids. 6. Decompression of previously noted hydronephrosis on the right by percutaneous nephrostomy tube. No obstructing calculus in the distal right ureter. Given adenopathy, extrinsic compression from adenopathy may have contributed to the hydronephrosis and hydroureter. 7. Mild lower lumbar facet arthropathy. No acute nor suspicious osseous abnormalities. Electronically Signed   By: Ashley Royalty M.D.   On: 09/02/2017 23:59   Ct Chest Wo Contrast  Result Date: 09/02/2017 CLINICAL DATA:  Leukocytosis. History of dyspnea, appendectomy and partial hysterectomy. Ureteral obstruction with nephrostomy placed on the right. EXAM: CT CHEST, ABDOMEN AND PELVIS WITHOUT CONTRAST TECHNIQUE: Multidetector CT imaging of the chest, abdomen and pelvis was performed following the standard protocol without IV contrast. COMPARISON:  CXR 09/01/2017, CT abdomen pelvis 08/20/2017. 02/10/2010 MRI FINDINGS: CT CHEST FINDINGS Cardiovascular: Normal branch pattern of the great vessels with atherosclerosis identified within the right brachiocephalic and left subclavian artery origins. Mild aortic atherosclerosis at the arch without aneurysm. Dilatation of the main pulmonary artery up to 2.9 cm consistent with chronic pulmonary hypertension is noted. Heart is normal in size. There is minimal pericardial fluid or thickening anteriorly. Mediastinum/Nodes: Enlarged supraclavicular adenopathy as before, the largest on the left measuring 2 cm short axis with superior mediastinal adenopathy also noted, the largest is right paratracheal measuring 1.1 cm with prevascular lymph nodes measuring up to 1 cm short axis. Trachea and mainstem bronchi appear patent. No thyromegaly. Normal-appearing esophagus. Lungs/Pleura: New small  bilateral pleural effusions with adjacent compressive atelectasis. Subpleural blebs are seen in the upper lobes right greater than left. Musculoskeletal: No chest wall mass or suspicious bone lesions identified. CT ABDOMEN PELVIS FINDINGS Hepatobiliary: A hypodense lesion with central calcification measuring 6.5 x 6.9 x 9.6 cm is again seen at the right hepatic dome with central calcification measuring 2.8 cm in diameter. Reportedly this was felt to represent a hemangioma on prior MRI from 2011. No biliary dilatation. No calcified gallstones. Pancreas: Unremarkable. No pancreatic ductal dilatation or surrounding inflammatory changes. Spleen: The spleen is enlarged measuring 14.2 x 10.6 x 14.1 cm (volume = 1110 cm^3). No focal masses. Adrenals/Urinary Tract: Pain percutaneous nephrostomy tube is seen within the right renal collecting system with chronic perinephric fat stranding. No obstructive uropathy or nephrolithiasis is currently noted. Normal adrenal glands. Stomach/Bowel: Decompressed stomach with normal small bowel rotation. Enteric contrast is seen within distal ileum and large bowel to the level of the mid sigmoid colon. There is extensive descending and sigmoid diverticulosis without acute diverticulitis. Vascular/Lymphatic: Moderate aortoiliac atherosclerosis without aneurysm. Once again, retroperitoneal para-aortic and retrocaval lymphadenopathy is again seen, largest measuring up to 2 cm short axis to the left of the aorta and 1.8 cm short axis to the right. Reproductive: Calcified uterine fibroids are noted. No adnexal masses. Other: New moderate third spacing of fluid with indurated subcutaneous soft tissues of the lower chest, abdomen or pelvis. New small to moderate free fluid  is seen within the lower abdomen and pelvis. Musculoskeletal: Grade 1 anterolisthesis of L4 and L5 attributable to facet arthropathy. There is lower lumbar degenerative facet arthropathy L3 through S1. No acute nor suspicious  osseous abnormalities. IMPRESSION: 1. Supraclavicular, mediastinal, and retroperitoneal adenopathy is again noted consistent with lymphoma, metastasis or lymphoproliferative disorder. 2. Ill-defined hypodense right hepatic dome mass measuring 6.5 x 6.9 x 9.6 cm is again noted with central 2.8 cm calcification, noted to be behaving like a hemangioma on MRI from 2011. 3. Stable splenomegaly. 4. New third spacing of fluid in the subcutaneous soft tissues of the lower chest, abdomen and pelvis with small to moderate amount of free fluid in the pelvis and new bilateral pleural effusions with compressive atelectasis. 5. Calcified uterine fibroids. 6. Decompression of previously noted hydronephrosis on the right by percutaneous nephrostomy tube. No obstructing calculus in the distal right ureter. Given adenopathy, extrinsic compression from adenopathy may have contributed to the hydronephrosis and hydroureter. 7. Mild lower lumbar facet arthropathy. No acute nor suspicious osseous abnormalities. Electronically Signed   By: Ashley Royalty M.D.   On: 09/02/2017 23:59    Scheduled Meds: . allopurinol  300 mg Oral Daily  . dextromethorphan-guaiFENesin  1 tablet Oral BID  . feeding supplement  1 Container Oral TID BM  . pantoprazole  40 mg Oral Daily  . phosphorus  500 mg Oral TID  . potassium chloride  40 mEq Oral BID  . predniSONE  60 mg Oral Q breakfast  . simvastatin  20 mg Oral Daily   Continuous Infusions: . sodium chloride    . sodium chloride    . ceFEPime (MAXIPIME) IV Stopped (09/04/17 1300)  . famotidine    . magnesium sulfate 1 - 4 g bolus IVPB 2 g (09/03/17 0922)    LOS: 15 days   Velvet Bathe, DO [facility] Pager [phone]  If 7PM-7AM, please contact night-coverage [email or web address] Password [facility] 09/04/2017, 7:17 PM

## 2017-09-04 NOTE — Progress Notes (Signed)
Rituxan infusion started at 1352 at 50 mgs/hr rate, plan is to keep infusion capped at 50 mgs. 2 hours into infusion , patient complained of chills, infusion stopped, vital signs taken, T 98.2, P 80 R 18 and BP 139/77, warm blanket applied, benadryl, pepcid and solu medrol given as part of hypersensitivity protocol, Dr Burr Medico notified,Patient felt better after meds.Infusion of Rituxan restarted at 25 mgs/hr. To continue close monitoring .

## 2017-09-04 NOTE — Progress Notes (Signed)
Kristina Torres   DOB:07-18-1946   XN#:[id number]   URK#:[id number]  Hematology and Oncology service follow up note   Subjective: Pt was transferred to [facility] last night, on fluid restriction, dyspnea has improved. She is still very weak, diarrhea improved, no fever, VS stable.   Objective:  Vitals:   09/03/17 2125 09/04/17 0458  BP: 100/64 (!) 100/59  Pulse: 92 79  Resp: 20 20  Temp: 98.5 F (36.9 C) 98.1 F (36.7 C)  SpO2: 93% 90%    Body mass index is 41.4 kg/m.  Intake/Output Summary (Last 24 hours) at 09/04/17 1956 Last data filed at 09/04/17 0800  Gross per 24 hour  Intake                0 ml  Output              600 ml  Net             -600 ml     Sclerae unicteric  Oropharynx clear  No peripheral adenopathy In her neck, supraclavicular, axilla or groin area  Lungs clear -- no rales or rhonchi  Heart regular rate and rhythm  Abdomen benign  MSK no focal spinal tenderness, no peripheral edema  Neuro nonfocal, somnolent    CBG (last 3)  No results for input(s): GLUCAP in the last 72 hours.   Labs:  Urine Studies No results for input(s): UHGB, CRYS in the last 72 hours.  Invalid input(s): UACOL, UAPR, USPG, UPH, UTP, UGL, UKET, UBIL, UNIT, UROB, [place], UEPI, UWBC, Junie Panning [place]igg[place], [place], [place]  Basic Metabolic Panel:  Recent Labs Lab 08/30/17 0316 08/31/17 0546 09/01/17 0329 09/02/17 0520 09/03/17 0326 09/04/17 0823  NA 129* 130* 132* 131* 132* 135  K 3.7 4.2 3.8 3.1* 4.0 3.7  CL 100* 101 102 98* 99* 101  CO2 23 21* '24 24 24 24  '$ GLUCOSE 94 98 84 111* 104* 157*  BUN 21* 15 20 25* 25* 28*  CREATININE 1.02* 1.11* 1.35* 1.56* 1.19* 0.96  CALCIUM 7.1* 6.9* 6.5* 6.1* 6.3* 6.8*  MG 1.7 1.5* 1.7 1.5* 1.6*  --   PHOS 1.6* 1.8* 3.5 4.6 4.0  --    GFR Estimated Creatinine Clearance: 55.8 mL/min (by C-G formula based on SCr of 0.96 mg/dL). Liver Function Tests:  Recent Labs Lab 08/31/17 0546 09/01/17 0329 09/02/17 0520 09/03/17 0326  09/04/17 0823  AST 55* 54* 73* 75* 34  ALT 39 37 34 29 29  ALKPHOS 159* 138* 140* 125 138*  BILITOT 2.4* 2.2* 2.8* 3.0* 2.8*  PROT 3.4* 3.4* 3.3* 3.2* 3.5*  ALBUMIN 1.6* 1.6* 1.5* 1.5* 1.7*   No results for input(s): LIPASE, AMYLASE in the last 168 hours. No results for input(s): AMMONIA in the last 168 hours. Coagulation profile  Recent Labs Lab 08/30/17 1447 09/03/17 1018  INR 1.23 1.19    CBC:  Recent Labs Lab 08/31/17 1316 09/01/17 0329 09/02/17 0520 09/03/17 0326 09/03/17 1018 09/04/17 0823  WBC 13.2* 18.1* 34.3* 27.9*  --  20.9*  NEUTROABS 9.2* 9.4* 20.6* 18.5*  --  14.2*  HGB 8.6* 7.3* 9.1* 8.6*  --  8.9*  HCT 25.6* 21.3* 26.8* 24.6*  --  25.2*  MCV 98.1 98.2 95.0 94.3  --  94.7  PLT 69* 62* 63* 49* 47* 62*   Cardiac Enzymes: No results for input(s): CKTOTAL, CKMB, CKMBINDEX, TROPONINI in the last 168 hours. BNP: Invalid input(s): POCBNP CBG: No results for input(s): GLUCAP in the last 168  hours. D-Dimer  Recent Labs  09/03/17 1018  DDIMER 2.40*   Hgb A1c No results for input(s): HGBA1C in the last 72 hours. Lipid Profile No results for input(s): CHOL, HDL, LDLCALC, TRIG, CHOLHDL, LDLDIRECT in the last 72 hours. Thyroid function studies No results for input(s): TSH, T4TOTAL, T3FREE, THYROIDAB in the last 72 hours.  Invalid input(s): FREET3 Anemia work up No results for input(s): VITAMINB12, FOLATE, FERRITIN, TIBC, IRON, RETICCTPCT in the last 72 hours. Microbiology Recent Results (from the past 240 hour(s))  Culture, Urine     Status: None   Collection Time: 09/01/17  9:07 AM  Result Value Ref Range Status   Specimen Description URINE, RANDOM  Final   Special Requests NONE  Final   Culture NO GROWTH  Final   Report Status 09/02/2017 FINAL  Final  Culture, blood (routine x 2)     Status: None (Preliminary result)   Collection Time: 09/01/17  9:32 AM  Result Value Ref Range Status   Specimen Description BLOOD BLOOD LEFT ARM  Final    Special Requests   Final    IN PEDIATRIC BOTTLE Blood Culture results may not be optimal due to an excessive volume of blood received in culture bottles   Culture NO GROWTH 3 DAYS  Final   Report Status PENDING  Incomplete  Culture, blood (routine x 2)     Status: None (Preliminary result)   Collection Time: 09/01/17  9:32 AM  Result Value Ref Range Status   Specimen Description BLOOD BLOOD LEFT ARM  Final   Special Requests   Final    IN PEDIATRIC BOTTLE Blood Culture results may not be optimal due to an excessive volume of blood received in culture bottles   Culture NO GROWTH 3 DAYS  Final   Report Status PENDING  Incomplete  C difficile quick scan w PCR reflex     Status: None   Collection Time: 09/03/17  2:25 PM  Result Value Ref Range Status   C Diff antigen NEGATIVE NEGATIVE Final   C Diff toxin NEGATIVE NEGATIVE Final   C Diff interpretation No C. difficile detected.  Final    Pathology report  Diagnosis 08/31/2017 Bone Marrow, Aspirate,Biopsy, and Clot, left iliac - HYPERCELLULAR BONE MARROW WITH INVOLVEMENT BY A B-CELL LYMPHOPROLIFERATIVE DISORDER. - SEE COMMENT. PERIPHERAL BLOOD: - NORMOCYTIC-NORMOCHROMIC ANEMIA. - CIRCULATING ATYPICAL LYMPHOID CELLS. - THROMBOCYTOPENIA. Diagnosis Note The bone marrow is prominently involved by a B-cell lymphoproliferative process with morphologic and phenotypic features of large B-cell lymphoma. 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 While a significant proportion of patients with this disorder show at least partial regression in response to drug withdrawal, most require cytotoxic therapy. Clinical and cytogenetic correlation is recommended. (BNS:ecj 09/03/2017).  Studies:  Ct Abdomen Pelvis Wo Contrast  Result Date: 09/02/2017 CLINICAL DATA:  Leukocytosis. History of dyspnea, appendectomy and partial  hysterectomy. Ureteral obstruction with nephrostomy placed on the right. EXAM: CT CHEST, ABDOMEN AND PELVIS WITHOUT CONTRAST TECHNIQUE: Multidetector CT imaging of the chest, abdomen and pelvis was performed following the standard protocol without IV contrast. COMPARISON:  CXR 09/01/2017, CT abdomen pelvis 08/20/2017. 02/10/2010 MRI FINDINGS: CT CHEST FINDINGS Cardiovascular: Normal branch pattern of the great vessels with atherosclerosis identified within the right brachiocephalic and left subclavian artery origins. Mild aortic atherosclerosis at the arch without aneurysm. Dilatation of the main pulmonary artery up to 2.9 cm consistent with chronic pulmonary hypertension is noted. Heart is normal  in size. There is minimal pericardial fluid or thickening anteriorly. Mediastinum/Nodes: Enlarged supraclavicular adenopathy as before, the largest on the left measuring 2 cm short axis with superior mediastinal adenopathy also noted, the largest is right paratracheal measuring 1.1 cm with prevascular lymph nodes measuring up to 1 cm short axis. Trachea and mainstem bronchi appear patent. No thyromegaly. Normal-appearing esophagus. Lungs/Pleura: New small bilateral pleural effusions with adjacent compressive atelectasis. Subpleural blebs are seen in the upper lobes right greater than left. Musculoskeletal: No chest wall mass or suspicious bone lesions identified. CT ABDOMEN PELVIS FINDINGS Hepatobiliary: A hypodense lesion with central calcification measuring 6.5 x 6.9 x 9.6 cm is again seen at the right hepatic dome with central calcification measuring 2.8 cm in diameter. Reportedly this was felt to represent a hemangioma on prior MRI from 2011. No biliary dilatation. No calcified gallstones. Pancreas: Unremarkable. No pancreatic ductal dilatation or surrounding inflammatory changes. Spleen: The spleen is enlarged measuring 14.2 x 10.6 x 14.1 cm (volume = 1110 cm^3). No focal masses. Adrenals/Urinary Tract: Pain  percutaneous nephrostomy tube is seen within the right renal collecting system with chronic perinephric fat stranding. No obstructive uropathy or nephrolithiasis is currently noted. Normal adrenal glands. Stomach/Bowel: Decompressed stomach with normal small bowel rotation. Enteric contrast is seen within distal ileum and large bowel to the level of the mid sigmoid colon. There is extensive descending and sigmoid diverticulosis without acute diverticulitis. Vascular/Lymphatic: Moderate aortoiliac atherosclerosis without aneurysm. Once again, retroperitoneal para-aortic and retrocaval lymphadenopathy is again seen, largest measuring up to 2 cm short axis to the left of the aorta and 1.8 cm short axis to the right. Reproductive: Calcified uterine fibroids are noted. No adnexal masses. Other: New moderate third spacing of fluid with indurated subcutaneous soft tissues of the lower chest, abdomen or pelvis. New small to moderate free fluid is seen within the lower abdomen and pelvis. Musculoskeletal: Grade 1 anterolisthesis of L4 and L5 attributable to facet arthropathy. There is lower lumbar degenerative facet arthropathy L3 through S1. No acute nor suspicious osseous abnormalities. IMPRESSION: 1. Supraclavicular, mediastinal, and retroperitoneal adenopathy is again noted consistent with lymphoma, metastasis or lymphoproliferative disorder. 2. Ill-defined hypodense right hepatic dome mass measuring 6.5 x 6.9 x 9.6 cm is again noted with central 2.8 cm calcification, noted to be behaving like a hemangioma on MRI from 2011. 3. Stable splenomegaly. 4. New third spacing of fluid in the subcutaneous soft tissues of the lower chest, abdomen and pelvis with small to moderate amount of free fluid in the pelvis and new bilateral pleural effusions with compressive atelectasis. 5. Calcified uterine fibroids. 6. Decompression of previously noted hydronephrosis on the right by percutaneous nephrostomy tube. No obstructing calculus  in the distal right ureter. Given adenopathy, extrinsic compression from adenopathy may have contributed to the hydronephrosis and hydroureter. 7. Mild lower lumbar facet arthropathy. No acute nor suspicious osseous abnormalities. Electronically Signed   By: Ashley Royalty M.D.   On: 09/02/2017 23:59   Ct Chest Wo Contrast  Result Date: 09/02/2017 CLINICAL DATA:  Leukocytosis. History of dyspnea, appendectomy and partial hysterectomy. Ureteral obstruction with nephrostomy placed on the right. EXAM: CT CHEST, ABDOMEN AND PELVIS WITHOUT CONTRAST TECHNIQUE: Multidetector CT imaging of the chest, abdomen and pelvis was performed following the standard protocol without IV contrast. COMPARISON:  CXR 09/01/2017, CT abdomen pelvis 08/20/2017. 02/10/2010 MRI FINDINGS: CT CHEST FINDINGS Cardiovascular: Normal branch pattern of the great vessels with atherosclerosis identified within the right brachiocephalic and left subclavian artery origins. Mild aortic atherosclerosis at  the arch without aneurysm. Dilatation of the main pulmonary artery up to 2.9 cm consistent with chronic pulmonary hypertension is noted. Heart is normal in size. There is minimal pericardial fluid or thickening anteriorly. Mediastinum/Nodes: Enlarged supraclavicular adenopathy as before, the largest on the left measuring 2 cm short axis with superior mediastinal adenopathy also noted, the largest is right paratracheal measuring 1.1 cm with prevascular lymph nodes measuring up to 1 cm short axis. Trachea and mainstem bronchi appear patent. No thyromegaly. Normal-appearing esophagus. Lungs/Pleura: New small bilateral pleural effusions with adjacent compressive atelectasis. Subpleural blebs are seen in the upper lobes right greater than left. Musculoskeletal: No chest wall mass or suspicious bone lesions identified. CT ABDOMEN PELVIS FINDINGS Hepatobiliary: A hypodense lesion with central calcification measuring 6.5 x 6.9 x 9.6 cm is again seen at the right  hepatic dome with central calcification measuring 2.8 cm in diameter. Reportedly this was felt to represent a hemangioma on prior MRI from 2011. No biliary dilatation. No calcified gallstones. Pancreas: Unremarkable. No pancreatic ductal dilatation or surrounding inflammatory changes. Spleen: The spleen is enlarged measuring 14.2 x 10.6 x 14.1 cm (volume = 1110 cm^3). No focal masses. Adrenals/Urinary Tract: Pain percutaneous nephrostomy tube is seen within the right renal collecting system with chronic perinephric fat stranding. No obstructive uropathy or nephrolithiasis is currently noted. Normal adrenal glands. Stomach/Bowel: Decompressed stomach with normal small bowel rotation. Enteric contrast is seen within distal ileum and large bowel to the level of the mid sigmoid colon. There is extensive descending and sigmoid diverticulosis without acute diverticulitis. Vascular/Lymphatic: Moderate aortoiliac atherosclerosis without aneurysm. Once again, retroperitoneal para-aortic and retrocaval lymphadenopathy is again seen, largest measuring up to 2 cm short axis to the left of the aorta and 1.8 cm short axis to the right. Reproductive: Calcified uterine fibroids are noted. No adnexal masses. Other: New moderate third spacing of fluid with indurated subcutaneous soft tissues of the lower chest, abdomen or pelvis. New small to moderate free fluid is seen within the lower abdomen and pelvis. Musculoskeletal: Grade 1 anterolisthesis of L4 and L5 attributable to facet arthropathy. There is lower lumbar degenerative facet arthropathy L3 through S1. No acute nor suspicious osseous abnormalities. IMPRESSION: 1. Supraclavicular, mediastinal, and retroperitoneal adenopathy is again noted consistent with lymphoma, metastasis or lymphoproliferative disorder. 2. Ill-defined hypodense right hepatic dome mass measuring 6.5 x 6.9 x 9.6 cm is again noted with central 2.8 cm calcification, noted to be behaving like a hemangioma on  MRI from 2011. 3. Stable splenomegaly. 4. New third spacing of fluid in the subcutaneous soft tissues of the lower chest, abdomen and pelvis with small to moderate amount of free fluid in the pelvis and new bilateral pleural effusions with compressive atelectasis. 5. Calcified uterine fibroids. 6. Decompression of previously noted hydronephrosis on the right by percutaneous nephrostomy tube. No obstructing calculus in the distal right ureter. Given adenopathy, extrinsic compression from adenopathy may have contributed to the hydronephrosis and hydroureter. 7. Mild lower lumbar facet arthropathy. No acute nor suspicious osseous abnormalities. Electronically Signed   By: Ashley Royalty M.D.   On: 09/02/2017 23:59    Assessment: Kristina Torres is a 71 y.o. female with a history of HTN, gout, HLD and Rheumatoid Arthritis on MTX, presented with severe right hydronephrosis, fatigue, nausea,.and weakness    1. Diffuse large B cell lymphoma, with bone marrow involvement, stage IV, IPS 4, high risk  2. Worsening anemia and thrombocytopenia, secondary to #1 3. Anasarca and hypoalbuminemia  4. RA, MTX on hold for  now  5. HTN 6. Right hydronephrosis secondary to ureteral obstruction, status post nephrostomy tube placement 7. Severe hypercalcemia, resolved  8. Nausea and vomiting, anorexia  9. Metabolic encephalopathy, likely secondary to hypercalcemia, resolved  10. HCAP, on antibiotics  11. Pulmonary edema, improved  12. Diarrhea, c-diff negative   Plan:  -Pt's bone marrow biopsy and flow cytometry finally confirmed diffuse large B-cell lymphoma, possibly related to immunosuppressant methotrexate -I again reviewed the benefits and potential side effects from chemotherapy with patient and her husband today, she agrees to start treatment today.  --She will receive Rituxan infusion today, cap rate at 50 mL per hour, to low the risk of infusion reaction -Plan to give her cycle 1 chemotherapy CVP tomorrow,  with cytoxan dose reduction  -I have started her on prednisone 60 mg daily for 5 days (until 9/21) -Continue antibiotics as per primary team  -I encourage her to continue nutrition supplement -SW on 3W for SNF arrangement  -I have spoken with IR Dr. Kathlene Cote today, he feels her right ureteral obstruction is possibly related to her retroperitoneal adenopathy, and unlikely he can place a stent and internalize her nephrostomy tube. So we decided to postpone her procedure to before or after next cycle of chemotherapy, with the hope of the ureter obstruction will improve after chemotherapy. Dr. Kathlene Cote states the nephrostomy tube needs to be exchanged every 6-8 weeks. It does increase risk of infection after chemotherapy, I'll watch her urine and signs of infection closely. -will check lab acbc, cmp daily, check uric acid tomorrow  -I will follow up closely   I spent a total of 30 mins in her care today, >50% face to face counseling  Truitt Merle, MD 09/04/2017  1pm

## 2017-09-04 NOTE — Progress Notes (Signed)
Pt transferred from [facility] to 3W. To receive chemotherapy for  Diffuse large B cell lymphoma, with bone marrow involvement. CM will continue to follow along and assist with disposition planning as needed. Marney Doctor RN,BSN,NCM [phone]

## 2017-09-04 NOTE — Progress Notes (Signed)
CSW following for disposition. Reviewed pt's chart and met with pt at bedside. Pt transferred from [facility] Previous CSW had been following to assist with referral to SNF for ST rehab at [place]. During conversation today, pt reports, "I vaguely remember this, so much has happened since then. Oncology is working with me now." Pt reports she may still be interested in SNF at [place] but not yet ready to plan due to waiting on further medical work up and treatment plan. Reports having supportive spouse and family which will assist her in decision making as well.  CSW will follow.  Sharren Bridge, MSW, LCSW Clinical Social Work 09/04/2017 [phone]

## 2017-09-04 NOTE — Progress Notes (Signed)
Patient signed chemotherapy consent at 11:45 am

## 2017-09-04 NOTE — Progress Notes (Signed)
Chemo dosages and calculations checked Aldean Baker RN.

## 2017-09-05 ENCOUNTER — Encounter (HOSPITAL_COMMUNITY)
Admission: EM | Disposition: A | Discharge status: Rehabilitation Facility | Payer: Self-pay | Source: Home / Self Care | Attending: Internal Medicine

## 2017-09-05 DIAGNOSIS — Z5111 Encounter for antineoplastic chemotherapy: Secondary | ICD-10-CM

## 2017-09-05 DIAGNOSIS — R5381 Other malaise: Secondary | ICD-10-CM

## 2017-09-05 DIAGNOSIS — D6481 Anemia due to antineoplastic chemotherapy: Secondary | ICD-10-CM

## 2017-09-05 DIAGNOSIS — Z881 Allergy status to other antibiotic agents status: Secondary | ICD-10-CM

## 2017-09-05 DIAGNOSIS — J810 Acute pulmonary edema: Secondary | ICD-10-CM

## 2017-09-05 DIAGNOSIS — R197 Diarrhea, unspecified: Secondary | ICD-10-CM

## 2017-09-05 DIAGNOSIS — R5383 Other fatigue: Secondary | ICD-10-CM

## 2017-09-05 LAB — COMPREHENSIVE METABOLIC PANEL
ALK PHOS: 123 U/L (ref 38–126)
ALT: 27 U/L (ref 14–54)
AST: 33 U/L (ref 15–41)
Albumin: 1.6 g/dL — ABNORMAL LOW (ref 3.5–5.0)
Anion gap: 11 (ref 5–15)
BUN: 32 mg/dL — AB (ref 6–20)
CALCIUM: 6.6 mg/dL — AB (ref 8.9–10.3)
CO2: 22 mmol/L (ref 22–32)
Chloride: 102 mmol/L (ref 101–111)
Creatinine, Ser: 1.05 mg/dL — ABNORMAL HIGH (ref 0.44–1.00)
GFR calc Af Amer: >60 mL/min (ref >60)
GFR calc non Af Amer: 52 mL/min — ABNORMAL LOW (ref >60)
GLUCOSE: 187 mg/dL — AB (ref 65–99)
Potassium: 4.3 mmol/L (ref 3.5–5.1)
Sodium: 135 mmol/L (ref 135–145)
TOTAL PROTEIN: 3.3 g/dL — AB (ref 6.5–8.1)
Total Bilirubin: 2 mg/dL — ABNORMAL HIGH (ref 0.3–1.2)

## 2017-09-05 LAB — CBC WITH DIFFERENTIAL/PLATELET
BASOS ABS: 0 10*3/uL (ref 0.0–0.1)
BASOS PCT: 0 %
Eosinophils Absolute: 0 10*3/uL (ref 0.0–0.7)
Eosinophils Relative: 0 %
HEMATOCRIT: 25.3 % — AB (ref 36.0–46.0)
HEMOGLOBIN: 8.7 g/dL — AB (ref 12.0–15.0)
Lymphocytes Relative: 7 %
Lymphs Abs: 0.6 10*3/uL — ABNORMAL LOW (ref 0.7–4.0)
MCH: 33.3 pg (ref 26.0–34.0)
MCHC: 34.4 g/dL (ref 30.0–36.0)
MCV: 96.9 fL (ref 78.0–100.0)
MYELOCYTES: 3 %
Monocytes Absolute: 0.3 10*3/uL (ref 0.1–1.0)
Monocytes Relative: 4 %
NEUTROS ABS: 6.7 10*3/uL (ref 1.7–7.7)
NRBC: 1 /100{WBCs} — AB
Neutrophils Relative %: 81 %
Other: 5 %
Platelets: 49 10*3/uL — ABNORMAL LOW (ref 150–400)
RBC: 2.61 MIL/uL — ABNORMAL LOW (ref 3.87–5.11)
RDW: 21.8 % — ABNORMAL HIGH (ref 11.5–15.5)
WBC: 8 10*3/uL (ref 4.0–10.5)

## 2017-09-05 LAB — HEPATITIS B SURFACE ANTIGEN
HEP B S AG: NEGATIVE
Hepatitis B Surface Ag: NEGATIVE

## 2017-09-05 LAB — MAGNESIUM: MAGNESIUM: 1.8 mg/dL (ref 1.7–2.4)

## 2017-09-05 LAB — URIC ACID: Uric Acid, Serum: 4.7 mg/dL (ref 2.3–6.6)

## 2017-09-05 SURGERY — LYMPH NODE BIOPSY
Anesthesia: General | Laterality: Left

## 2017-09-05 MED ORDER — DIPHENHYDRAMINE HCL 50 MG PO CAPS
50.0000 mg | ORAL_CAPSULE | Freq: Once | ORAL | Status: AC
  Administered 2017-09-05: 50 mg via ORAL
  Filled 2017-09-05: qty 1

## 2017-09-05 MED ORDER — SODIUM CHLORIDE 0.9 % IV SOLN
700.0000 mg | Freq: Once | INTRAVENOUS | Status: AC
  Administered 2017-09-05: 700 mg via INTRAVENOUS
  Filled 2017-09-05: qty 70

## 2017-09-05 MED ORDER — ACETAMINOPHEN 325 MG PO TABS
650.0000 mg | ORAL_TABLET | Freq: Once | ORAL | Status: AC
  Administered 2017-09-05: 650 mg via ORAL
  Filled 2017-09-05: qty 2

## 2017-09-05 MED ORDER — CEFAZOLIN SODIUM-DEXTROSE 2-4 GM/100ML-% IV SOLN
2.0000 g | INTRAVENOUS | Status: AC
  Administered 2017-09-06: 2 g via INTRAVENOUS

## 2017-09-05 MED ORDER — SODIUM CHLORIDE 0.9 % IV SOLN
500.0000 mg | Freq: Once | INTRAVENOUS | Status: AC
  Administered 2017-09-05: 500 mg via INTRAVENOUS
  Filled 2017-09-05: qty 50

## 2017-09-05 NOTE — Progress Notes (Signed)
Dr. Burr Medico paged to update on chemo status. Rituxan not completed as scheduled. Found clamped by night shift RN. MD ordered to restart at 50mg /hr and monitor closely.

## 2017-09-05 NOTE — Progress Notes (Signed)
Pt tolerating Rituxan infusion well. No s/s of reaction. VSS. No distress noted. Infusing at pt's max dose of 50mg /hr.

## 2017-09-05 NOTE — Progress Notes (Signed)
Went in to round on pt, and checked iv lines and discovered Chemo line was locked and not infusing. Immediately I unlocked the line and called pharmacy for further instructions. Pharmacist told this nurse to continue the infusion at 25 and slowing titrate until 35mg  or 40 mg. Nurse followed instructions, and explained to pt and family the situation.

## 2017-09-05 NOTE — Progress Notes (Signed)
Referring Physician(s): Feng,Y  Supervising Physician: Arne Cleveland  Patient Status:  [facility] - In-pt  Chief Complaint:  lymphoma  Subjective: Pt familiar to IR service from prior rt PCN 9/4 secondary to ureteral obstruction followed by PCN exchange on 9/14 due to poor output. She has also undergone left retroperitoneal lymph node biopsy on 9/6, left supraclavicular lymph node biopsy on 9/7 and bone marrow biopsy on 9/14. She has been recently diagnosed with diffuse large B-cell lymphoma. Double-J stent placement was also recently requested due to location of high-grade ureteral obstruction and Dr. Kathlene Cote felt it was unlikely to be able traverse until after chemotherapy. Request now received from oncology for Port-A-Cath placement for chemotherapy. She currently denies fever, nausea, vomiting, worsening dyspnea or bleeding. She has had some loose stools but c diff neg. Past Medical History:  Diagnosis Date  . Essential hypertension   . Gout   . HLD (hyperlipidemia)   . Rheumatoid arthritis [facility])    Past Surgical History:  Procedure Laterality Date  . APPENDECTOMY    . CARPAL TUNNEL RELEASE Right   . IR NEPHROSTOMY EXCHANGE RIGHT  08/31/2017  . IR NEPHROSTOMY PLACEMENT RIGHT  08/21/2017  . LAPAROSCOPIC SALPINGO OOPHERECTOMY Right       Allergies: Streptomycin  Medications: Prior to Admission medications   Medication Sig Start Date End Date Taking? Authorizing Provider  allopurinol (ZYLOPRIM) 300 MG tablet Take 300 mg by mouth daily. 05/19/17  Yes [provider]  Calcium Carb-Cholecalciferol (CALCIUM 1000 + D PO) Take 1 tablet by mouth daily.   Yes [provider]  ciprofloxacin (CIPRO) 250 MG tablet Take 250 mg by mouth 2 (two) times daily. 08/15/17  Yes [provider]  hydrochlorothiazide (MICROZIDE) 12.5 MG capsule Take 12.5 mg by mouth daily.  07/19/17  Yes [provider]  methotrexate (RHEUMATREX) 2.5 MG tablet Take 17.5 mg by mouth  once a week. Take 7 tabs (2.54m tablets) qMonday 05/20/17  Yes [provider]  ondansetron (ZOFRAN-ODT) 4 MG disintegrating tablet Take 4 mg by mouth as needed. 08/14/17  Yes [provider]  simvastatin (ZOCOR) 20 MG tablet Take 20 mg by mouth daily. 08/17/17  Yes [provider]     Vital Signs: BP (!) 95/50 (BP Location: Right Arm)   Pulse 72   Temp (!) 97.3 F (36.3 C) (Oral)   Resp 16   Ht 5' (1.524 m)   Wt 212 lb (96.2 kg)   SpO2 94%   BMI 41.40 kg/m   Physical Exam awake, alert. Chest with sl dim BS bases ; heart- RRR; abd- soft,+BS,NT; LE- 3+ edema bilat; right PCN intact draining yellow urine- output 650 cc  Imaging: Ct Abdomen Pelvis Wo Contrast  Result Date: 09/02/2017 CLINICAL DATA:  Leukocytosis. History of dyspnea, appendectomy and partial hysterectomy. Ureteral obstruction with nephrostomy placed on the right. EXAM: CT CHEST, ABDOMEN AND PELVIS WITHOUT CONTRAST TECHNIQUE: Multidetector CT imaging of the chest, abdomen and pelvis was performed following the standard protocol without IV contrast. COMPARISON:  CXR 09/01/2017, CT abdomen pelvis 08/20/2017. 02/10/2010 MRI FINDINGS: CT CHEST FINDINGS Cardiovascular: Normal branch pattern of the great vessels with atherosclerosis identified within the right brachiocephalic and left subclavian artery origins. Mild aortic atherosclerosis at the arch without aneurysm. Dilatation of the main pulmonary artery up to 2.9 cm consistent with chronic pulmonary hypertension is noted. Heart is normal in size. There is minimal pericardial fluid or thickening anteriorly. Mediastinum/Nodes: Enlarged supraclavicular adenopathy as before, the largest on the left measuring  2 cm short axis with superior mediastinal adenopathy also noted, the largest is right paratracheal measuring 1.1 cm with prevascular lymph nodes measuring up to 1 cm short axis. Trachea and mainstem bronchi appear patent. No thyromegaly. Normal-appearing  esophagus. Lungs/Pleura: New small bilateral pleural effusions with adjacent compressive atelectasis. Subpleural blebs are seen in the upper lobes right greater than left. Musculoskeletal: No chest wall mass or suspicious bone lesions identified. CT ABDOMEN PELVIS FINDINGS Hepatobiliary: A hypodense lesion with central calcification measuring 6.5 x 6.9 x 9.6 cm is again seen at the right hepatic dome with central calcification measuring 2.8 cm in diameter. Reportedly this was felt to represent a hemangioma on prior MRI from 2011. No biliary dilatation. No calcified gallstones. Pancreas: Unremarkable. No pancreatic ductal dilatation or surrounding inflammatory changes. Spleen: The spleen is enlarged measuring 14.2 x 10.6 x 14.1 cm (volume = 1110 cm^3). No focal masses. Adrenals/Urinary Tract: Pain percutaneous nephrostomy tube is seen within the right renal collecting system with chronic perinephric fat stranding. No obstructive uropathy or nephrolithiasis is currently noted. Normal adrenal glands. Stomach/Bowel: Decompressed stomach with normal small bowel rotation. Enteric contrast is seen within distal ileum and large bowel to the level of the mid sigmoid colon. There is extensive descending and sigmoid diverticulosis without acute diverticulitis. Vascular/Lymphatic: Moderate aortoiliac atherosclerosis without aneurysm. Once again, retroperitoneal para-aortic and retrocaval lymphadenopathy is again seen, largest measuring up to 2 cm short axis to the left of the aorta and 1.8 cm short axis to the right. Reproductive: Calcified uterine fibroids are noted. No adnexal masses. Other: New moderate third spacing of fluid with indurated subcutaneous soft tissues of the lower chest, abdomen or pelvis. New small to moderate free fluid is seen within the lower abdomen and pelvis. Musculoskeletal: Grade 1 anterolisthesis of L4 and L5 attributable to facet arthropathy. There is lower lumbar degenerative facet arthropathy L3  through S1. No acute nor suspicious osseous abnormalities. IMPRESSION: 1. Supraclavicular, mediastinal, and retroperitoneal adenopathy is again noted consistent with lymphoma, metastasis or lymphoproliferative disorder. 2. Ill-defined hypodense right hepatic dome mass measuring 6.5 x 6.9 x 9.6 cm is again noted with central 2.8 cm calcification, noted to be behaving like a hemangioma on MRI from 2011. 3. Stable splenomegaly. 4. New third spacing of fluid in the subcutaneous soft tissues of the lower chest, abdomen and pelvis with small to moderate amount of free fluid in the pelvis and new bilateral pleural effusions with compressive atelectasis. 5. Calcified uterine fibroids. 6. Decompression of previously noted hydronephrosis on the right by percutaneous nephrostomy tube. No obstructing calculus in the distal right ureter. Given adenopathy, extrinsic compression from adenopathy may have contributed to the hydronephrosis and hydroureter. 7. Mild lower lumbar facet arthropathy. No acute nor suspicious osseous abnormalities. Electronically Signed   By: Ashley Royalty M.D.   On: 09/02/2017 23:59   Ct Chest Wo Contrast  Result Date: 09/02/2017 CLINICAL DATA:  Leukocytosis. History of dyspnea, appendectomy and partial hysterectomy. Ureteral obstruction with nephrostomy placed on the right. EXAM: CT CHEST, ABDOMEN AND PELVIS WITHOUT CONTRAST TECHNIQUE: Multidetector CT imaging of the chest, abdomen and pelvis was performed following the standard protocol without IV contrast. COMPARISON:  CXR 09/01/2017, CT abdomen pelvis 08/20/2017. 02/10/2010 MRI FINDINGS: CT CHEST FINDINGS Cardiovascular: Normal branch pattern of the great vessels with atherosclerosis identified within the right brachiocephalic and left subclavian artery origins. Mild aortic atherosclerosis at the arch without aneurysm. Dilatation of the main pulmonary artery up to 2.9 cm consistent with chronic pulmonary hypertension is noted. Heart  is normal in  size. There is minimal pericardial fluid or thickening anteriorly. Mediastinum/Nodes: Enlarged supraclavicular adenopathy as before, the largest on the left measuring 2 cm short axis with superior mediastinal adenopathy also noted, the largest is right paratracheal measuring 1.1 cm with prevascular lymph nodes measuring up to 1 cm short axis. Trachea and mainstem bronchi appear patent. No thyromegaly. Normal-appearing esophagus. Lungs/Pleura: New small bilateral pleural effusions with adjacent compressive atelectasis. Subpleural blebs are seen in the upper lobes right greater than left. Musculoskeletal: No chest wall mass or suspicious bone lesions identified. CT ABDOMEN PELVIS FINDINGS Hepatobiliary: A hypodense lesion with central calcification measuring 6.5 x 6.9 x 9.6 cm is again seen at the right hepatic dome with central calcification measuring 2.8 cm in diameter. Reportedly this was felt to represent a hemangioma on prior MRI from 2011. No biliary dilatation. No calcified gallstones. Pancreas: Unremarkable. No pancreatic ductal dilatation or surrounding inflammatory changes. Spleen: The spleen is enlarged measuring 14.2 x 10.6 x 14.1 cm (volume = 1110 cm^3). No focal masses. Adrenals/Urinary Tract: Pain percutaneous nephrostomy tube is seen within the right renal collecting system with chronic perinephric fat stranding. No obstructive uropathy or nephrolithiasis is currently noted. Normal adrenal glands. Stomach/Bowel: Decompressed stomach with normal small bowel rotation. Enteric contrast is seen within distal ileum and large bowel to the level of the mid sigmoid colon. There is extensive descending and sigmoid diverticulosis without acute diverticulitis. Vascular/Lymphatic: Moderate aortoiliac atherosclerosis without aneurysm. Once again, retroperitoneal para-aortic and retrocaval lymphadenopathy is again seen, largest measuring up to 2 cm short axis to the left of the aorta and 1.8 cm short axis to the  right. Reproductive: Calcified uterine fibroids are noted. No adnexal masses. Other: New moderate third spacing of fluid with indurated subcutaneous soft tissues of the lower chest, abdomen or pelvis. New small to moderate free fluid is seen within the lower abdomen and pelvis. Musculoskeletal: Grade 1 anterolisthesis of L4 and L5 attributable to facet arthropathy. There is lower lumbar degenerative facet arthropathy L3 through S1. No acute nor suspicious osseous abnormalities. IMPRESSION: 1. Supraclavicular, mediastinal, and retroperitoneal adenopathy is again noted consistent with lymphoma, metastasis or lymphoproliferative disorder. 2. Ill-defined hypodense right hepatic dome mass measuring 6.5 x 6.9 x 9.6 cm is again noted with central 2.8 cm calcification, noted to be behaving like a hemangioma on MRI from 2011. 3. Stable splenomegaly. 4. New third spacing of fluid in the subcutaneous soft tissues of the lower chest, abdomen and pelvis with small to moderate amount of free fluid in the pelvis and new bilateral pleural effusions with compressive atelectasis. 5. Calcified uterine fibroids. 6. Decompression of previously noted hydronephrosis on the right by percutaneous nephrostomy tube. No obstructing calculus in the distal right ureter. Given adenopathy, extrinsic compression from adenopathy may have contributed to the hydronephrosis and hydroureter. 7. Mild lower lumbar facet arthropathy. No acute nor suspicious osseous abnormalities. Electronically Signed   By: Ashley Royalty M.D.   On: 09/02/2017 23:59    Labs:  CBC:  Recent Labs  09/02/17 0520 09/03/17 0326 09/03/17 1018 09/04/17 0823 09/05/17 0357  WBC 34.3* 27.9*  --  20.9* 8.0  HGB 9.1* 8.6*  --  8.9* 8.7*  HCT 26.8* 24.6*  --  25.2* 25.3*  PLT 63* 49* 47* 62* 49*    COAGS:  Recent Labs  08/20/17 2255 08/30/17 1447 09/03/17 1018  INR 1.16 1.23 1.19  APTT _0 BMP:  Recent Labs  09/02/17 0520 09/03/17 0326  09/04/17  1657 09/05/17 0357  NA 131* 132* 135 135  K 3.1* 4.0 3.7 4.3  CL 98* 99* 101 102  CO2 _0 GLUCOSE 111* 104* 157* 187*  BUN 25* 25* 28* 32*  CALCIUM 6.1* 6.3* 6.8* 6.6*  CREATININE 1.56* 1.19* 0.96 1.05*  GFRNONAA 32* 45* 58* 52*  GFRAA 37* 52* >60 >60    LIVER FUNCTION TESTS:  Recent Labs  09/02/17 0520 09/03/17 0326 09/04/17 0823 09/05/17 0357  BILITOT 2.8* 3.0* 2.8* 2.0*  AST 73* 75* 34 33  ALT 34 _1 ALKPHOS 140* 125 138* 123  PROT 3.3* 3.2* 3.5* 3.3*  ALBUMIN 1.5* 1.5* 1.7* 1.6*    Assessment and Plan: Pt with hx diffuse large B-cell lymphoma; prior right percutaneous nephrostomy 9/4 with exchange 9/14 secondary to high-grade ureteral obstruction in upper pelvis with very little chance of traversing with JJ stent currently per Dr. Kathlene Cote; he recommends waiting until after chemotherapy has been administered for double-J stent placement to avoid multiple attempts. Request now received for Port-A-Cath placement for chemotherapy. Platelet count currently 49k; patient is afebrile with normal WBC.Risks and benefits discussed with the patient/family including, but not limited to bleeding, infection, pneumothorax, or fibrin sheath development and need for additional procedures. All of the patient's questions were answered, patient is agreeable to proceed. Consent signed and in chart.Will recheck labs in am and proceed with port placement if plt count is adequate.     Electronically Signed: D. Rowe Robert, PA-C 09/05/2017, 4:02 PM   I spent a total of 25 minutes  at the the patient's bedside AND on the patient's hospital floor or unit, greater than 50% of which was counseling/coordinating care for port a cath placement    Patient ID: Kristina Torres, female   DOB: Jan 04, 1946, 71 y.o.   MRN: [id number]

## 2017-09-05 NOTE — Progress Notes (Signed)
Patient ID: Kristina Torres, female   DOB: [birth year], 71 y.o.   MRN: [id number]          [facility] for Infectious Disease  Date of Admission:  08/20/2017    Total days of antibiotics 23        Day 2 cefepime        ASSESSMENT:  All recent urine and blood cultures have been negative and her C. difficile screen was also negative. She does not have any evidence of active infection. I will stop antibiotics now.  PLAN: 1.  Discontinue cefepime 2. I will sign off now but please call if I can be of further assistance while she is here.  Principal Problem:   Nausea & vomiting Active Problems:   Rheumatoid arthritis ([place])   Essential hypertension   Gout   UTI (urinary tract infection)   Sepsis (HCC)   HLD (hyperlipidemia)   Hypotension   AKI (acute kidney injury) ([place])   Hypercalcemia   Thrombocytopenia (HCC)   Cough   Macrocytic anemia   Lymphadenopathy   Hypophosphatemia   Hypomagnesemia   Hyponatremia   Acute urinary retention   Hyperbilirubinemia   DLBCL (diffuse large B cell lymphoma) (HCC)   Cytopenia   . allopurinol  300 mg Oral Daily  . dextromethorphan-guaiFENesin  1 tablet Oral BID  . feeding supplement  1 Container Oral TID BM  . pantoprazole  40 mg Oral Daily  . phosphorus  500 mg Oral TID  . potassium chloride  40 mEq Oral BID  . predniSONE  60 mg Oral Q breakfast  . simvastatin  20 mg Oral Daily    SUBJECTIVE: She is feeling better. She does not believe she was having any fever prior to admission. She denies any recent dysuria before admission. She continues to have problems with loose stools since starting antibiotics.  Review of Systems: Review of Systems  Constitutional: Positive for malaise/fatigue. Negative for chills, diaphoresis, fever and weight loss.  HENT: Negative for sore throat.   Respiratory: Negative for cough, sputum production and shortness of breath.   Cardiovascular: Negative for chest pain.  Gastrointestinal: Positive for  diarrhea. Negative for abdominal pain, heartburn, nausea and vomiting.  Genitourinary: Negative for dysuria and frequency.  Musculoskeletal: Negative for joint pain and myalgias.  Skin: Negative for rash.  Neurological: Negative for dizziness and headaches.    Allergies  Allergen Reactions  . Streptomycin Anaphylaxis    OBJECTIVE: Vitals:   09/04/17 2125 09/05/17 0630 09/05/17 0830 09/05/17 0854  BP: 104/62 (!) 95/58 (!) 96/52 (!) 98/57  Pulse: 78 73 84 82  Resp: 20 20 17 16   Temp: (!) 97.4 F (36.3 C) 97.9 F (36.6 C) (!) 97.5 F (36.4 C) 97.8 F (36.6 C)  TempSrc: Oral Oral Oral Oral  SpO2:  91% 94% 94%  Weight:      Height:       Body mass index is 41.4 kg/m.  Physical Exam  Constitutional: She is oriented to person, place, and time.  She appears comfortable and in good spirits. She is eating breakfast.  Cardiovascular: Normal rate and regular rhythm.   No murmur heard. Pulmonary/Chest: Effort normal and breath sounds normal. She has no wheezes. She has no rales.  Abdominal: Soft. She exhibits no distension. There is no tenderness.  Neurological: She is alert and oriented to person, place, and time.  Skin: No rash noted.  Psychiatric: Mood and affect normal.    Lab Results Lab Results  Component Value Date  WBC 8.0 09/05/2017   HGB 8.7 (L) 09/05/2017   HCT 25.3 (L) 09/05/2017   MCV 96.9 09/05/2017   PLT 49 (L) 09/05/2017    Lab Results  Component Value Date   CREATININE 1.05 (H) 09/05/2017   BUN 32 (H) 09/05/2017   NA 135 09/05/2017   K 4.3 09/05/2017   CL 102 09/05/2017   CO2 22 09/05/2017    Lab Results  Component Value Date   ALT 27 09/05/2017   AST 33 09/05/2017   ALKPHOS 123 09/05/2017   BILITOT 2.0 (H) 09/05/2017     Microbiology: Recent Results (from the past 240 hour(s))  Culture, Urine     Status: None   Collection Time: 09/01/17  9:07 AM  Result Value Ref Range Status   Specimen Description URINE, RANDOM  Final   Special  Requests NONE  Final   Culture NO GROWTH  Final   Report Status 09/02/2017 FINAL  Final  Culture, blood (routine x 2)     Status: None (Preliminary result)   Collection Time: 09/01/17  9:32 AM  Result Value Ref Range Status   Specimen Description BLOOD BLOOD LEFT ARM  Final   Special Requests   Final    IN PEDIATRIC BOTTLE Blood Culture results may not be optimal due to an excessive volume of blood received in culture bottles   Culture NO GROWTH 3 DAYS  Final   Report Status PENDING  Incomplete  Culture, blood (routine x 2)     Status: None (Preliminary result)   Collection Time: 09/01/17  9:32 AM  Result Value Ref Range Status   Specimen Description BLOOD BLOOD LEFT ARM  Final   Special Requests   Final    IN PEDIATRIC BOTTLE Blood Culture results may not be optimal due to an excessive volume of blood received in culture bottles   Culture NO GROWTH 3 DAYS  Final   Report Status PENDING  Incomplete  C difficile quick scan w PCR reflex     Status: None   Collection Time: 09/03/17  2:25 PM  Result Value Ref Range Status   C Diff antigen NEGATIVE NEGATIVE Final   C Diff toxin NEGATIVE NEGATIVE Final   C Diff interpretation No C. difficile detected.  Final    Michel Bickers, [place] [facility] [place] [facility] [phone] pager   [phone] cell 09/05/2017, 10:10 AM

## 2017-09-05 NOTE — Progress Notes (Signed)
Pharmacy Note: Chemotherapy  Rituxan 700mg  IV infusion begun 9/18 at max rate 50mg /hr, reaction noted after 2 hr of infusion. Benadryl/Pepcid/SoluMedrol given. Onc MD contacted, instructed RN to resume Rituxan at 25 mg/hr, pump programmed, but line not unclamped.  9/19 am at 0150 RN noted Rituxan line still clamped, not infusing.  RN called Pharmacy, recommended resume Rituxan at 25 ml/hr, consider titrating slowly up to 35-40 mg/hr.   - Rituxan not resumed 0200.  Noted on first shift that Rituxan held, contacted Onc MD, resume current bag at 25mg /hr, unfortunately bag expiring 1030 am today 9/19.  Rituxan resumed at 0830 at 25mg /hr, no reactions noted.  Make-up infusion of 500mg  Rituxan to resume at max of 50mg /hr today, Day 2 of chemo regimen.  Postpone Vicristine & Cyclophosphamide to 9/20 (Day 3)  Minda Ditto PharmD Pager [phone] 09/05/2017, 12:23 PM

## 2017-09-05 NOTE — Progress Notes (Signed)
Progress Note    Kristina Torres  WFU:[id number] DOB: Mar 23, 1946  DOA: 08/20/2017 PCP: [name], MD    Brief Narrative:   Chief complaint: Follow-up lymphoma  Medical records reviewed and are as summarized below:  Kristina Torres is an 71 y.o. female with fairly complex PMH including hypertension, hyperlipidemia, gout, and rheumatoid arthritis treated with methotrexate was admitted 08/20/17 for evaluation of a one-week history of nausea and vomiting associated with abdominal pain. Initial workup revealed a right renal mass with right hydroureteronephrosis. She was placed on Cipro for UTI and scheduled to see a urologist in follow-up however due to persistent nausea/vomiting she was admitted. CT abdomen/pelvis showed a hypodense mass in the right hepatic lobe, bulky retroperitoneal lymphadenopathy and splenomegaly as well as severe right hydronephrosis and hydroureter. A right percutaneous nephrostomy tube was placed by IR she subsequently underwent a retroperitoneal lymph node biopsy on 08/23/17 with repeat biopsy 08/24/17 ultimately showed high grade lymphoma. She also underwent bone marrow biopsy 08/31/17. She has now begun chemotherapy.  Assessment/Plan:   Principal Problem:   DLBCL (diffuse large B cell lymphoma) ([place]) Rituxan given 09/04/17. Held after she developed a mild reaction including chills. Subsequently resumed and has tolerated. This will be followed by Cytoxan and vincristine 09/06/17 with prednisone. Will need a Port-A-Cath for ongoing chemotherapy.  Active Problems:   Severe right hydronephrosis and hydroureter/AKI Initially treated with nephrostomy tube per IR 08/21/17. Subsequently underwent nephrostogram/nephrostomy tube exchange on 08/31/17 after her percutaneous nephrostomy tube stopped draining. Has been followed by urology and essentially we are waiting for chemotherapy to shrink the mass to definitively treat this with a possible internalized nephrostomy tube with  double-J stent. Creatinine is stable at 1.05.    Rheumatoid arthritis (HCC) Methotrexate has been placed on hold given her newly diagnosed lymphoma.    Hypotension/history of Essential hypertension Antihypertensives on hold due to soft blood pressure.    Gout Continue allopurinol.    Acute urinary retention Foley placed. Will need outpatient urology follow-up.    HLD (hyperlipidemia) Continue statin.    Nausea & vomiting Resolved.    Hypercalcemia Likely secondary to malignancy in the setting of medication side effects including Tums, hydrochlorothiazide, calcium/vitamin D supplements. She was hydrated, given Lasix, as well as a dose of pamidronate, calcitonin 4 doses. Calcium now low, but almost in the normal range when corrected for low albumin.    Thrombocytopenia ([place]) Related to lymphoma. Platelet count has steadily dropped. Continue to monitor closely.    Normocytic anemia Multifactorial with history of methotrexate/anemia of chronic disease contributory. Anemia panel reviewed. Being transfused as needed. Continue to transfuse for hemoglobin less than 8.    Electrolyte imbalances including Hypophosphatemia/Hypomagnesemia/Hyponatremia Monitor and replace electrolytes as needed.    Obesity/malnutrition related to acute illness Body mass index is 41.4 kg/m. Continue nutritional supplements.   Family Communication/Anticipated D/C date and plan/Code Status   DVT prophylaxis: SCDs ordered. Code Status: Full Code.  Family Communication: Family updated at the bedside. Disposition Plan: Home when cleared by oncologist.   Medical Consultants:    Urology  Medical Oncology  Interventional Radiology  Cardiology  ENT  Infectious Diseases   Anti-Infectives:    None  Subjective:   Patient reports that she is doing fairly well considering what she has been through. Appetite fair. No current nausea or vomiting. Developed chills with Rituxan yesterday, but  these have resolved. Still feels weak and has been refusing physical therapy. No dyspnea today.  Objective:    Vitals:  09/05/17 1330 09/05/17 1355 09/05/17 1430 09/05/17 1626  BP: (!) 94/48 (!) 94/47 (!) 95/50 (!) 102/58  Pulse: 74 70 72 77  Resp: '15 14 16 17  '$ Temp: (!) 97.5 F (36.4 C) (!) 97.3 F (36.3 C) (!) 97.3 F (36.3 C) (!) 97.3 F (36.3 C)  TempSrc: Oral Oral Oral Oral  SpO2: 95% 94% 94% 95%  Weight:      Height:        Intake/Output Summary (Last 24 hours) at 09/05/17 2004 Last data filed at 09/05/17 2376  Gross per 24 hour  Intake                0 ml  Output              675 ml  Net             -675 ml   Filed Weights   09/03/17 0413 09/03/17 2125 09/04/17 0446  Weight: 95.3 kg (210 lb 1.6 oz) 96.6 kg (212 lb 15.4 oz) 96.2 kg (212 lb)    Exam: General: No acute distress. Cardiovascular: Heart sounds show a regular rate, and rhythm. No gallops or rubs. No murmurs. No JVD. Lungs: Clear to auscultation bilaterally with good air movement. No rales, rhonchi or wheezes. Abdomen: Soft, nontender, nondistended with normal active bowel sounds. No masses. No hepatosplenomegaly. Neurological: Alert and oriented 3. Moves all extremities 4 with equal strength. Cranial nerves II through XII grossly intact. Skin: Warm and dry. No rashes or lesions. Extremities: No clubbing or cyanosis. + edema. Pedal pulses 2+. Psychiatric: Mood and affect are normal. Insight and judgment are fair.   Data Reviewed:   I have personally reviewed following labs and imaging studies:  Labs: Labs show the following: Sodium 135, potassium 4.3, chloride 102, bicarbonate 22, BUN 32, creatinine 1.05, glucose 187, calcium 6.6, AST 33, ALT 27, alkaline phosphatase 123, total bilirubin 2.0, protein 3.3, albumin 1.6. WBC 8.0, hemoglobin 8.7, hematocrit 25.3, platelets 49.  Microbiology Recent Results (from the past 240 hour(s))  Culture, Urine     Status: None   Collection Time: 09/01/17  9:07  AM  Result Value Ref Range Status   Specimen Description URINE, RANDOM  Final   Special Requests NONE  Final   Culture NO GROWTH  Final   Report Status 09/02/2017 FINAL  Final  Culture, blood (routine x 2)     Status: None (Preliminary result)   Collection Time: 09/01/17  9:32 AM  Result Value Ref Range Status   Specimen Description BLOOD BLOOD LEFT ARM  Final   Special Requests   Final    IN PEDIATRIC BOTTLE Blood Culture results may not be optimal due to an excessive volume of blood received in culture bottles   Culture NO GROWTH 4 DAYS  Final   Report Status PENDING  Incomplete  Culture, blood (routine x 2)     Status: None (Preliminary result)   Collection Time: 09/01/17  9:32 AM  Result Value Ref Range Status   Specimen Description BLOOD BLOOD LEFT ARM  Final   Special Requests   Final    IN PEDIATRIC BOTTLE Blood Culture results may not be optimal due to an excessive volume of blood received in culture bottles   Culture NO GROWTH 4 DAYS  Final   Report Status PENDING  Incomplete  C difficile quick scan w PCR reflex     Status: None   Collection Time: 09/03/17  2:25 PM  Result Value Ref Range Status  C Diff antigen NEGATIVE NEGATIVE Final   C Diff toxin NEGATIVE NEGATIVE Final   C Diff interpretation No C. difficile detected.  Final    Procedures and diagnostic studies:  No results found.  Medications:   . allopurinol  300 mg Oral Daily  . dextromethorphan-guaiFENesin  1 tablet Oral BID  . feeding supplement  1 Container Oral TID BM  . pantoprazole  40 mg Oral Daily  . phosphorus  500 mg Oral TID  . potassium chloride  40 mEq Oral BID  . predniSONE  60 mg Oral Q breakfast  . simvastatin  20 mg Oral Daily   Continuous Infusions: . sodium chloride    . sodium chloride    . [START ON 09/06/2017]  ceFAZolin (ANCEF) IV    . famotidine    . magnesium sulfate 1 - 4 g bolus IVPB 2 g (09/03/17 0922)     LOS: 16 days   Kristina Torres  [facility] Pager [phone]. If unable to reach me by pager, please call my cell phone at [phone].  *Please refer to [email or web address], password [email or web address] to get updated schedule on who will round on this patient, as hospitalists switch teams weekly. If 7PM-7AM, please contact night-coverage at [email or web address], password [email or web address] for any overnight needs.  09/05/2017, 8:04 PM

## 2017-09-05 NOTE — Progress Notes (Signed)
Patient ID: Kristina Torres, female   DOB: 02-23-1946, 71 y.o.   MRN: [id number] Tent start time for port a cath placement on 9/20 is 3:30 pm. Pt may require platelet transfusion preprocedure. Dr. Burr Medico aware. Ideally would like count above 50k.

## 2017-09-05 NOTE — Progress Notes (Signed)
Rituxan rate/dosage verified with Lottie Dawson, RN.

## 2017-09-05 NOTE — Progress Notes (Signed)
Kristina Torres   DOB:1946-07-11   KD#:[id number]   LZJ#:[id number]  Hematology and Oncology service follow up note   Subjective: Pt started Rituxan infusion yesterday, had a mild chill, resolved after temporary holding infusion and benadryl. She laid on developed some numbness in the right lung, infusion was stopped, and her nurse forgot to restart it last night. She received about 200mg  Rituxan yesterday. She restarted her unknown time today, tolerating well so far. She overall feels better, appetite has improved, eating better, no nausea or vomiting, no fever or other new complaints. Her dyspnea has much improved, she is on fluid restriction 1.5 L/day.   Objective:  Vitals:   09/05/17 1355 09/05/17 1430  BP: (!) 94/47 (!) 95/50  Pulse: 70 72  Resp: 14 16  Temp: (!) 97.3 F (36.3 C) (!) 97.3 F (36.3 C)  SpO2: 94% 94%    Body mass index is 41.4 kg/m.  Intake/Output Summary (Last 24 hours) at 09/05/17 1513 Last data filed at 09/05/17 0240  Gross per 24 hour  Intake                0 ml  Output              675 ml  Net             -675 ml     Sclerae unicteric  Oropharynx clear  No peripheral adenopathy In her neck, supraclavicular, axilla or groin area  Lungs clear -- no rales or rhonchi  Heart regular rate and rhythm  Abdomen benign  MSK no focal spinal tenderness, no peripheral edema  Neuro nonfocal, somnolent    CBG (last 3)  No results for input(s): GLUCAP in the last 72 hours.   Labs:  Urine Studies No results for input(s): UHGB, CRYS in the last 72 hours.  Invalid input(s): UACOL, UAPR, USPG, UPH, UTP, UGL, UKET, UBIL, UNIT, UROB, ULEU, UEPI, UWBC, Junie Panning [place], [place], [place]  Basic Metabolic Panel:  Recent Labs Lab 08/30/17 0316 08/31/17 0546 09/01/17 0329 09/02/17 0520 09/03/17 0326 09/04/17 0823 09/05/17 0357  NA 129* 130* 132* 131* 132* 135 135  K 3.7 4.2 3.8 3.1* 4.0 3.7 4.3  CL 100* 101 102 98* 99* 101 102  CO2 23 21* 24 24 24 24 22   GLUCOSE 94  98 84 111* 104* 157* 187*  BUN 21* 15 20 25* 25* 28* 32*  CREATININE 1.02* 1.11* 1.35* 1.56* 1.19* 0.96 1.05*  CALCIUM 7.1* 6.9* 6.5* 6.1* 6.3* 6.8* 6.6*  MG 1.7 1.5* 1.7 1.5* 1.6*  --  1.8  PHOS 1.6* 1.8* 3.5 4.6 4.0  --   --    GFR Estimated Creatinine Clearance: 51 mL/min (A) (by C-G formula based on SCr of 1.05 mg/dL (H)). Liver Function Tests:  Recent Labs Lab 09/01/17 0329 09/02/17 0520 09/03/17 0326 09/04/17 0823 09/05/17 0357  AST 54* 73* 75* 34 33  ALT 37 34 29 29 27   ALKPHOS 138* 140* 125 138* 123  BILITOT 2.2* 2.8* 3.0* 2.8* 2.0*  PROT 3.4* 3.3* 3.2* 3.5* 3.3*  ALBUMIN 1.6* 1.5* 1.5* 1.7* 1.6*   No results for input(s): LIPASE, AMYLASE in the last 168 hours. No results for input(s): AMMONIA in the last 168 hours. Coagulation profile  Recent Labs Lab 08/30/17 1447 09/03/17 1018  INR 1.23 1.19    CBC:  Recent Labs Lab 09/01/17 0329 09/02/17 0520 09/03/17 0326 09/03/17 1018 09/04/17 0823 09/05/17 0357  WBC 18.1* 34.3* 27.9*  --  20.9* 8.0  NEUTROABS 9.4* 20.6* 18.5*  --  14.2* 6.7  HGB 7.3* 9.1* 8.6*  --  8.9* 8.7*  HCT 21.3* 26.8* 24.6*  --  25.2* 25.3*  MCV 98.2 95.0 94.3  --  94.7 96.9  PLT 62* 63* 49* 47* 62* 49*   Cardiac Enzymes: No results for input(s): CKTOTAL, CKMB, CKMBINDEX, TROPONINI in the last 168 hours. BNP: Invalid input(s): POCBNP CBG: No results for input(s): GLUCAP in the last 168 hours. D-Dimer  Recent Labs  09/03/17 1018  DDIMER 2.40*   Hgb A1c No results for input(s): HGBA1C in the last 72 hours. Lipid Profile No results for input(s): CHOL, HDL, LDLCALC, TRIG, CHOLHDL, LDLDIRECT in the last 72 hours. Thyroid function studies No results for input(s): TSH, T4TOTAL, T3FREE, THYROIDAB in the last 72 hours.  Invalid input(s): FREET3 Anemia work up No results for input(s): VITAMINB12, FOLATE, FERRITIN, TIBC, IRON, RETICCTPCT in the last 72 hours. Microbiology Recent Results (from the past 240 hour(s))  Culture,  Urine     Status: None   Collection Time: 09/01/17  9:07 AM  Result Value Ref Range Status   Specimen Description URINE, RANDOM  Final   Special Requests NONE  Final   Culture NO GROWTH  Final   Report Status 09/02/2017 FINAL  Final  Culture, blood (routine x 2)     Status: None (Preliminary result)   Collection Time: 09/01/17  9:32 AM  Result Value Ref Range Status   Specimen Description BLOOD BLOOD LEFT ARM  Final   Special Requests   Final    IN PEDIATRIC BOTTLE Blood Culture results may not be optimal due to an excessive volume of blood received in culture bottles   Culture NO GROWTH 3 DAYS  Final   Report Status PENDING  Incomplete  Culture, blood (routine x 2)     Status: None (Preliminary result)   Collection Time: 09/01/17  9:32 AM  Result Value Ref Range Status   Specimen Description BLOOD BLOOD LEFT ARM  Final   Special Requests   Final    IN PEDIATRIC BOTTLE Blood Culture results may not be optimal due to an excessive volume of blood received in culture bottles   Culture NO GROWTH 3 DAYS  Final   Report Status PENDING  Incomplete  C difficile quick scan w PCR reflex     Status: None   Collection Time: 09/03/17  2:25 PM  Result Value Ref Range Status   C Diff antigen NEGATIVE NEGATIVE Final   C Diff toxin NEGATIVE NEGATIVE Final   C Diff interpretation No C. difficile detected.  Final    Pathology report  Diagnosis 08/31/2017 Bone Marrow, Aspirate,Biopsy, and Clot, left iliac - HYPERCELLULAR BONE MARROW WITH INVOLVEMENT BY A B-CELL LYMPHOPROLIFERATIVE DISORDER. - SEE COMMENT. PERIPHERAL BLOOD: - NORMOCYTIC-NORMOCHROMIC ANEMIA. - CIRCULATING ATYPICAL LYMPHOID CELLS. - THROMBOCYTOPENIA. Diagnosis Note The bone marrow is prominently involved by a B-cell lymphoproliferative process with morphologic and phenotypic features of large B-cell lymphoma. This is occurring in the setting of known rheumatoid arthritis and methotrexate treatment consistent with  immunodeficieny-associated lymphoproliferative disorder. While a significant proportion of patients with this disorder show at least partial regression in response to drug withdrawal, most require cytotoxic therapy. Clinical and cytogenetic correlation is recommended. (BNS:ecj 09/03/2017).  Studies:  No results found.  Assessment: Kristina Torres is a 71 y.o. female with a history of HTN, gout, HLD and Rheumatoid Arthritis on MTX, presented with severe right hydronephrosis, fatigue, nausea,.and weakness    1. Diffuse large B  cell lymphoma, with bone marrow involvement, stage IV, IPS 4, high risk  2. Worsening anemia and thrombocytopenia, secondary to #1 3. Anasarca and hypoalbuminemia  4. RA, MTX on hold for now  5. HTN 6. Right hydronephrosis secondary to ureteral obstruction, status post nephrostomy tube placement 7. Severe hypercalcemia, resolved  8. Nausea and vomiting, anorexia  9. Metabolic encephalopathy, likely secondary to hypercalcemia, resolved  10. HCAP, on antibiotics  11. Pulmonary edema, improved  12. Diarrhea, c-diff negative   Plan:  -Restarted Rituxan today, tolerating well, plan to complete tonight. -We'll postpone her chemotherapy Cytoxan and vincristine to tomorrow -continue prednisone until 9/21 -will request IR to have a port place tomorrow or Friday  -appreciate the excellent care from hospitalist team -antibiotics stopped per ID -I again encourage her to increase her initiation supplement -PT/OT, and rehab replacement  -will f/u daily -updated family today    I spent a total of 30 mins in her care today, >50% face to face counseling  Truitt Merle, MD 09/05/2017

## 2017-09-05 NOTE — Progress Notes (Signed)
Rituxan infusion started at max rate of 50mg /hr per Dr. Burr Medico. Vitals WNL. Will continue to monitor.

## 2017-09-05 NOTE — Progress Notes (Signed)
PT Cancellation Note  Patient Details Name: Kristina Torres MRN: [id number] DOB: 06/09/1946   Cancelled Treatment:    Reason Eval/Treat Not Completed: Medical issues which prohibited therapy , patient states that she  Is getting chemo and wants to wait a day or 2. Will follow along and mobilize as patient able.   Claretha Cooper 09/05/2017, 4:20 PM Tresa Endo PT [phone]

## 2017-09-06 ENCOUNTER — Inpatient Hospital Stay (HOSPITAL_COMMUNITY): Payer: Medicare Other

## 2017-09-06 HISTORY — PX: IR FLUORO GUIDE PORT INSERTION RIGHT: IMG5741

## 2017-09-06 HISTORY — PX: IR US GUIDE VASC ACCESS RIGHT: IMG2390

## 2017-09-06 LAB — COMPREHENSIVE METABOLIC PANEL
ALT: 37 U/L (ref 14–54)
AST: 40 U/L (ref 15–41)
Albumin: 1.7 g/dL — ABNORMAL LOW (ref 3.5–5.0)
Alkaline Phosphatase: 131 U/L — ABNORMAL HIGH (ref 38–126)
Anion gap: 11 (ref 5–15)
BUN: 36 mg/dL — ABNORMAL HIGH (ref 6–20)
CHLORIDE: 105 mmol/L (ref 101–111)
CO2: 21 mmol/L — AB (ref 22–32)
CREATININE: 0.98 mg/dL (ref 0.44–1.00)
Calcium: 7 mg/dL — ABNORMAL LOW (ref 8.9–10.3)
GFR, EST NON AFRICAN AMERICAN: 57 mL/min — AB (ref >60)
Glucose, Bld: 114 mg/dL — ABNORMAL HIGH (ref 65–99)
POTASSIUM: 5.2 mmol/L — AB (ref 3.5–5.1)
SODIUM: 137 mmol/L (ref 135–145)
Total Bilirubin: 1.7 mg/dL — ABNORMAL HIGH (ref 0.3–1.2)
Total Protein: 3.8 g/dL — ABNORMAL LOW (ref 6.5–8.1)

## 2017-09-06 LAB — CBC WITH DIFFERENTIAL/PLATELET
BASOS PCT: 0 %
Basophils Absolute: 0 10*3/uL (ref 0.0–0.1)
EOS ABS: 0 10*3/uL (ref 0.0–0.7)
EOS PCT: 0 %
HCT: 28.6 % — ABNORMAL LOW (ref 36.0–46.0)
Hemoglobin: 9.7 g/dL — ABNORMAL LOW (ref 12.0–15.0)
LYMPHS PCT: 11 %
Lymphs Abs: 0.9 10*3/uL (ref 0.7–4.0)
MCH: 33.6 pg (ref 26.0–34.0)
MCHC: 33.9 g/dL (ref 30.0–36.0)
MCV: 99 fL (ref 78.0–100.0)
MONO ABS: 0.5 10*3/uL (ref 0.1–1.0)
Monocytes Relative: 6 %
Myelocytes: 2 %
NEUTROS ABS: 6.6 10*3/uL (ref 1.7–7.7)
NEUTROS PCT: 79 %
Other: 2 %
PLATELETS: 61 10*3/uL — AB (ref 150–400)
RBC: 2.89 MIL/uL — AB (ref 3.87–5.11)
RDW: 22.2 % — AB (ref 11.5–15.5)
WBC: 8.1 10*3/uL (ref 4.0–10.5)
nRBC: 1 /100 WBC — ABNORMAL HIGH

## 2017-09-06 LAB — CULTURE, BLOOD (ROUTINE X 2)
CULTURE: NO GROWTH
CULTURE: NO GROWTH

## 2017-09-06 MED ORDER — SODIUM CHLORIDE 0.9 % IV SOLN: Freq: Once | INTRAVENOUS | Status: DC | PRN

## 2017-09-06 MED ORDER — ACETAMINOPHEN 325 MG PO TABS
650.0000 mg | ORAL_TABLET | Freq: Once | ORAL | Status: DC
Start: 2017-09-06 — End: 2017-09-06

## 2017-09-06 MED ORDER — SODIUM CHLORIDE 0.9% FLUSH
INTRAVENOUS | Status: AC | PRN
  Administered 2017-09-06: 10 mL via INTRAVENOUS

## 2017-09-06 MED ORDER — EPINEPHRINE PF 1 MG/ML IJ SOLN: 0.5000 mg | Freq: Once | INTRAMUSCULAR | Status: DC | PRN

## 2017-09-06 MED ORDER — SODIUM POLYSTYRENE SULFONATE 15 GM/60ML PO SUSP
15.0000 g | Freq: Once | ORAL | Status: AC
  Administered 2017-09-06: 15 g via ORAL
  Filled 2017-09-06: qty 60

## 2017-09-06 MED ORDER — METHYLPREDNISOLONE SODIUM SUCC 125 MG IJ SOLR: 125.0000 mg | Freq: Once | INTRAMUSCULAR | Status: DC | PRN

## 2017-09-06 MED ORDER — DIPHENHYDRAMINE HCL 50 MG/ML IJ SOLN: 25.0000 mg | Freq: Once | INTRAMUSCULAR | Status: DC | PRN

## 2017-09-06 MED ORDER — ALTEPLASE 2 MG IJ SOLR: 2.0000 mg | Freq: Once | INTRAMUSCULAR | Status: DC | PRN

## 2017-09-06 MED ORDER — DIPHENHYDRAMINE HCL 50 MG PO CAPS: 50.0000 mg | ORAL_CAPSULE | Freq: Once | ORAL | Status: DC

## 2017-09-06 MED ORDER — ALBUTEROL SULFATE (2.5 MG/3ML) 0.083% IN NEBU: 2.5000 mg | INHALATION_SOLUTION | Freq: Once | RESPIRATORY_TRACT | Status: DC | PRN

## 2017-09-06 MED ORDER — LIDOCAINE-EPINEPHRINE (PF) 2 %-1:200000 IJ SOLN
INTRAMUSCULAR | Status: AC | PRN
  Administered 2017-09-06: 20 mL

## 2017-09-06 MED ORDER — EPINEPHRINE PF 1 MG/10ML IJ SOSY: 0.2500 mg | PREFILLED_SYRINGE | Freq: Once | INTRAMUSCULAR | Status: DC | PRN

## 2017-09-06 MED ORDER — VINCRISTINE SULFATE CHEMO INJECTION 1 MG/ML
2.0000 mg | Freq: Once | INTRAVENOUS | Status: AC
  Administered 2017-09-06: 2 mg via INTRAVENOUS
  Filled 2017-09-06: qty 2

## 2017-09-06 MED ORDER — HEPARIN SOD (PORK) LOCK FLUSH 100 UNIT/ML IV SOLN
250.0000 [IU] | Freq: Once | INTRAVENOUS | Status: DC | PRN
Start: 2017-09-06 — End: 2017-09-08

## 2017-09-06 MED ORDER — MIDAZOLAM HCL 2 MG/2ML IJ SOLN
INTRAMUSCULAR | Status: AC | PRN
  Administered 2017-09-06 (×2): 1 mg via INTRAVENOUS

## 2017-09-06 MED ORDER — FENTANYL CITRATE (PF) 100 MCG/2ML IJ SOLN
INTRAMUSCULAR | Status: AC
  Filled 2017-09-06: qty 2

## 2017-09-06 MED ORDER — LIDOCAINE-EPINEPHRINE (PF) 2 %-1:200000 IJ SOLN
INTRAMUSCULAR | Status: AC
  Filled 2017-09-06: qty 20

## 2017-09-06 MED ORDER — DIPHENHYDRAMINE HCL 50 MG/ML IJ SOLN: 50.0000 mg | Freq: Once | INTRAMUSCULAR | Status: DC | PRN

## 2017-09-06 MED ORDER — FAMOTIDINE IN NACL 20-0.9 MG/50ML-% IV SOLN
20.0000 mg | Freq: Once | INTRAVENOUS | Status: DC | PRN
Start: 2017-09-06 — End: 2017-09-08

## 2017-09-06 MED ORDER — PALONOSETRON HCL INJECTION 0.25 MG/5ML
0.2500 mg | Freq: Once | INTRAVENOUS | Status: AC
  Administered 2017-09-06: 0.25 mg via INTRAVENOUS
  Filled 2017-09-06: qty 5

## 2017-09-06 MED ORDER — MORPHINE SULFATE (PF) 4 MG/ML IV SOLN: 4.0000 mg | Freq: Once | INTRAVENOUS | Status: DC

## 2017-09-06 MED ORDER — SODIUM CHLORIDE 0.9 % IV SOLN
Freq: Once | INTRAVENOUS | Status: AC
  Administered 2017-09-06: 15:00:00 via INTRAVENOUS

## 2017-09-06 MED ORDER — SODIUM CHLORIDE 0.9% FLUSH: 3.0000 mL | INTRAVENOUS | Status: DC | PRN

## 2017-09-06 MED ORDER — SODIUM CHLORIDE 0.9 % IV SOLN
600.0000 mg/m2 | Freq: Once | INTRAVENOUS | Status: AC
  Administered 2017-09-06: 1180 mg via INTRAVENOUS
  Filled 2017-09-06: qty 59

## 2017-09-06 MED ORDER — HEPARIN SOD (PORK) LOCK FLUSH 100 UNIT/ML IV SOLN
500.0000 [IU] | Freq: Once | INTRAVENOUS | Status: AC | PRN
  Administered 2017-09-08: 500 [IU]

## 2017-09-06 MED ORDER — SODIUM CHLORIDE 0.9% FLUSH: 10.0000 mL | INTRAVENOUS | Status: DC | PRN

## 2017-09-06 MED ORDER — FENTANYL CITRATE (PF) 100 MCG/2ML IJ SOLN
INTRAMUSCULAR | Status: AC | PRN
  Administered 2017-09-06 (×2): 50 ug via INTRAVENOUS

## 2017-09-06 MED ORDER — MIDAZOLAM HCL 2 MG/2ML IJ SOLN
INTRAMUSCULAR | Status: AC
  Filled 2017-09-06: qty 2

## 2017-09-06 MED ORDER — CEFAZOLIN SODIUM-DEXTROSE 2-4 GM/100ML-% IV SOLN
INTRAVENOUS | Status: AC
  Administered 2017-09-06: 2 g via INTRAVENOUS
  Filled 2017-09-06: qty 100

## 2017-09-06 MED ORDER — SODIUM CHLORIDE 0.9 % IV SOLN
10.0000 mg | Freq: Once | INTRAVENOUS | Status: AC
  Administered 2017-09-06: 10 mg via INTRAVENOUS
  Filled 2017-09-06: qty 1

## 2017-09-06 MED ORDER — TRAMADOL HCL 50 MG PO TABS
50.0000 mg | ORAL_TABLET | Freq: Once | ORAL | Status: AC
  Administered 2017-09-06: 50 mg via ORAL

## 2017-09-06 NOTE — Procedures (Signed)
  Procedure:   R IJ Port catheter   Preprocedure diagnosis:  Lymphoma Postprocedure diagnosis:  same EBL:     minimal Complications:   none immediate  See full dictation in [facility].  Dillard Cannon MD Main # [phone] Pager  [phone]

## 2017-09-06 NOTE — Consult Note (Signed)
   [facility] CM Inpatient Consult   09/06/2017  Kristina Torres 02-16-1946 [id number]    Patient screened for potential [facility] Care Management services.   She was transferred from [facility] and is currently at [facility]. [name] has already been seen by [facility] Primary Care Navigator. No [facility] Care Mangagement needs identified at that time.  Chart reviewed. Noted discharge plan is for SNF. Spoke with inpatient RNCM to confirm.  No identifiable [facility] Care Management needs at this time.    Marthenia Rolling, MSN-Ed, RN,BSN [facility] Liaison [phone]

## 2017-09-06 NOTE — Progress Notes (Signed)
CSW following for disposition- pt has been referred for rehab at SNF [facility] [place]. Attempted to meet with pt today for update and to discuss her bed offers- pt off the floor for porto cath placement. Family in room- CSW discussed bed offers with them and husband is interested in [facility]. CSW contacted facility in order to make plans- will continue following and also verify with pt if this facility is her preference as well.   Sharren Bridge, MSW, LCSW Clinical Social Work 09/06/2017 [phone]

## 2017-09-06 NOTE — Progress Notes (Signed)
Progress Note    Kristina Torres  ULA:[id number] DOB: [birth year]  DOA: 08/20/2017 PCP: [name], MD    Brief Narrative:   Chief complaint: Follow-up lymphoma  Medical records reviewed and are as summarized below:  Kristina Torres is an 71 y.o. female with fairly complex PMH including hypertension, hyperlipidemia, gout, and rheumatoid arthritis treated with methotrexate was admitted 08/20/17 for evaluation of a one-week history of nausea and vomiting associated with abdominal pain. Initial workup revealed a right renal mass with right hydroureteronephrosis. She was placed on Cipro for UTI and scheduled to see a urologist in follow-up however due to persistent nausea/vomiting she was admitted. CT abdomen/pelvis showed a hypodense mass in the right hepatic lobe, bulky retroperitoneal lymphadenopathy and splenomegaly as well as severe right hydronephrosis and hydroureter. A right percutaneous nephrostomy tube was placed by IR she subsequently underwent a retroperitoneal lymph node biopsy on 08/23/17 with repeat biopsy 08/24/17 ultimately showed high grade lymphoma. She also underwent bone marrow biopsy 08/31/17. She has now begun chemotherapy.  Assessment/Plan:   Principal Problem:   DLBCL (diffuse large B cell lymphoma) ([place]) Rituxan given 09/04/17. Held after she developed a mild reaction including chills. Subsequently resumed and was colted 09/05/17. For Cytoxan and vincristine today with prednisone. Scheduled to have a Port-A-Cath placed today then will have chemo. Likely can be discharged to SNF if tolerates chemo without new symptoms requiring ongoing care.  Active Problems:   Severe right hydronephrosis and hydroureter/AKI Initially treated with nephrostomy tube per IR 08/21/17. Subsequently underwent nephrostogram/nephrostomy tube exchange on 08/31/17 after her percutaneous nephrostomy tube stopped draining. Has been followed by urology and essentially we are waiting for chemotherapy to  shrink the mass to definitively treat this with a possible internalized nephrostomy tube with double-J stent. Creatinine is stable at 0.98.    Rheumatoid arthritis (HCC) Methotrexate has been placed on hold given her newly diagnosed lymphoma.    Hypotension/history of Essential hypertension Antihypertensives on hold due to soft blood pressure.    Gout Continue allopurinol.    Acute urinary retention Foley placed. Will need outpatient urology follow-up.    HLD (hyperlipidemia) Continue statin.    Nausea & vomiting Resolved.    Hypercalcemia Likely secondary to malignancy in the setting of medication side effects including Tums, hydrochlorothiazide, calcium/vitamin D supplements. She was hydrated, given Lasix, as well as a dose of pamidronate, calcitonin 4 doses. Calcium now low, but corrects given low albumin.    Thrombocytopenia ([place]) Related to lymphoma. Platelet count has steadily dropped, but now up to 61 today. Continue to monitor closely.    Normocytic anemia Multifactorial with history of methotrexate/anemia of chronic disease contributory. Anemia panel reviewed. Being transfused as needed. Continue to transfuse for hemoglobin less than 8. Hgb stable at 9.7 today.    Electrolyte imbalances including Hypophosphatemia/Hypomagnesemia/Hyponatremia Monitor and replace electrolytes as needed. Potassium up, may be related to tumor lysis. Will give kayexalate.    Obesity/malnutrition related to acute illness Body mass index is 41.4 kg/m. Continue nutritional supplements.   Family Communication/Anticipated D/C date and plan/Code Status   DVT prophylaxis: SCDs ordered. Code Status: Full Code.  Family Communication: Family updated at the bedside. Disposition Plan: Home when cleared by oncologist.   Medical Consultants:    Urology  Medical Oncology  Interventional Radiology  Cardiology  ENT  Infectious Diseases   Anti-Infectives:    None  Subjective:    Feels well overall, but anxious about chemo and already stating she will need to stay in  the hospital a few more days "to make sure" she does OK. No current complaints of nausea, vomiting, pain.  Objective:    Vitals:   09/05/17 1430 09/05/17 1626 09/05/17 2112 09/06/17 0446  BP: (!) 95/50 (!) 102/58 (!) 95/52 95/62  Pulse: 72 77 80 79  Resp: _0 Temp: (!) 97.3 F (36.3 C) (!) 97.3 F (36.3 C) 97.8 F (36.6 C) 97.8 F (36.6 C)  TempSrc: Oral Oral Oral Oral  SpO2: 94% 95% 93% 94%  Weight:      Height:        Intake/Output Summary (Last 24 hours) at 09/06/17 0800 Last data filed at 09/05/17 2253  Gross per 24 hour  Intake                0 ml  Output              500 ml  Net             -500 ml   Filed Weights   09/03/17 0413 09/03/17 2125 09/04/17 0446  Weight: 95.3 kg (210 lb 1.6 oz) 96.6 kg (212 lb 15.4 oz) 96.2 kg (212 lb)    Exam: General: No acute distress. Chronically ill appearing. Cardiovascular: Heart sounds show a regular rate, and rhythm. No gallops or rubs. No murmurs. No JVD. Lungs: Clear to auscultation bilaterally with good air movement. No rales, rhonchi or wheezes. Abdomen: Soft, nontender, nondistended with normal active bowel sounds. No masses. No hepatosplenomegaly. Skin: Warm and dry. No rashes or lesions. Extremities: No clubbing or cyanosis. No edema. Pedal pulses 2+. Psychiatric: Mood and affect are anxious. Insight and judgment are fair.  Data Reviewed:   I have personally reviewed following labs and imaging studies:  Labs: Labs show the following: Sodium 135, potassium 4.3, chloride 102, bicarbonate 22, BUN 32, creatinine 1.05, glucose 187, calcium 6.6, AST 33, ALT 27, alkaline phosphatase 123, total bilirubin 2.0, protein 3.3, albumin 1.6. WBC 8.0, hemoglobin 8.7, hematocrit 25.3, platelets 49.  Microbiology Recent Results (from the past 240 hour(s))  Culture, Urine     Status: None   Collection Time: 09/01/17  9:07 AM  Result  Value Ref Range Status   Specimen Description URINE, RANDOM  Final   Special Requests NONE  Final   Culture NO GROWTH  Final   Report Status 09/02/2017 FINAL  Final  Culture, blood (routine x 2)     Status: None (Preliminary result)   Collection Time: 09/01/17  9:32 AM  Result Value Ref Range Status   Specimen Description BLOOD BLOOD LEFT ARM  Final   Special Requests   Final    IN PEDIATRIC BOTTLE Blood Culture results may not be optimal due to an excessive volume of blood received in culture bottles   Culture NO GROWTH 4 DAYS  Final   Report Status PENDING  Incomplete  Culture, blood (routine x 2)     Status: None (Preliminary result)   Collection Time: 09/01/17  9:32 AM  Result Value Ref Range Status   Specimen Description BLOOD BLOOD LEFT ARM  Final   Special Requests   Final    IN PEDIATRIC BOTTLE Blood Culture results may not be optimal due to an excessive volume of blood received in culture bottles   Culture NO GROWTH 4 DAYS  Final   Report Status PENDING  Incomplete  C difficile quick scan w PCR reflex     Status: None   Collection Time: 09/03/17  2:25  PM  Result Value Ref Range Status   C Diff antigen NEGATIVE NEGATIVE Final   C Diff toxin NEGATIVE NEGATIVE Final   C Diff interpretation No C. difficile detected.  Final    Procedures and diagnostic studies:  No results found.  Medications:   . allopurinol  300 mg Oral Daily  . dextromethorphan-guaiFENesin  1 tablet Oral BID  . feeding supplement  1 Container Oral TID BM  . pantoprazole  40 mg Oral Daily  . phosphorus  500 mg Oral TID  . potassium chloride  40 mEq Oral BID  . predniSONE  60 mg Oral Q breakfast  . simvastatin  20 mg Oral Daily   Continuous Infusions: . sodium chloride    . sodium chloride    .  ceFAZolin (ANCEF) IV    . famotidine    . magnesium sulfate 1 - 4 g bolus IVPB 2 g (09/03/17 0922)     LOS: 17 days   RAMA,CHRISTINA  [facility] Pager [phone]. If unable to reach me  by pager, please call my cell phone at [phone].  *Please refer to [email or web address], password [email or web address] to get updated schedule on who will round on this patient, as hospitalists switch teams weekly. If 7PM-7AM, please contact night-coverage at [email or web address], password [email or web address] for any overnight needs.  09/06/2017, 8:00 AM

## 2017-09-06 NOTE — Progress Notes (Signed)
Nutrition Follow-up  DOCUMENTATION CODES:   Morbid obesity  INTERVENTION:   -Continue Boost Breeze po TID, each supplement provides 250 kcal and 9 grams of protein -Daily snacks  RD will continue to monitor for needs  NUTRITION DIAGNOSIS:   Inadequate oral intake related to poor appetite as evidenced by per patient/family report.  GOAL:   Patient will meet greater than or equal to 90% of their needs  MONITOR:   PO intake, Supplement acceptance, Weight trends, I & O's  ASSESSMENT:   Pt with PMH of arthritis, gout, essential HTN, HLD presents with N/V  Patient in IR for port placement today. Pt NPO for that procedure. Pt has been drinking her Boost Breeze supplements. Weight is + 31 lb since 9/4.  Medications: Protonix tablet daily, K-Phos tablet TID, K-DUR tablet BID Labs reviewed: Elevated K Mg WNL  Diet Order:  Diet NPO time specified Except for: Sips with Meds  Skin:   (closed lower back incision)  Last BM:  9/18  Height:   Ht Readings from Last 1 Encounters:  09/03/17 5' (1.524 m)    Weight:   Wt Readings from Last 1 Encounters:  09/04/17 212 lb (96.2 kg)    Ideal Body Weight:  44 kg  BMI:  Body mass index is 41.4 kg/m.  Estimated Nutritional Needs:   Kcal:  7494-4967 kcals  Protein:  95-105 g  Fluid:  >/= 2 L/d  EDUCATION NEEDS:   Education needs no appropriate at this time  Clayton Bibles, MS, RD, LDN Pager: [phone] After Hours Pager: [phone]

## 2017-09-06 NOTE — Progress Notes (Signed)
Any Mcneice   DOB:1946/05/18   UM#:[id number]   VQM#:[id number]  Hematology and Oncology service follow up note   Subjective: Pt completed her toxin last night, tolerated very well without any reactions. She had a port placed by IR this morning. No other new complaints.   Objective:  Vitals:   09/06/17 1215 09/06/17 1220  BP: 97/65 (!) 105/49  Pulse: (!) 108 100  Resp: 12 (!) 27  Temp:    SpO2: 96% 96%    Body mass index is 42.07 kg/m.  Intake/Output Summary (Last 24 hours) at 09/06/17 1312 Last data filed at 09/06/17 0844  Gross per 24 hour  Intake                0 ml  Output              900 ml  Net             -900 ml     Sclerae unicteric  Oropharynx clear  No peripheral adenopathy In her neck, supraclavicular, axilla or groin area  Lungs clear -- no rales or rhonchi  Heart regular rate and rhythm  Abdomen benign  MSK no focal spinal tenderness, no peripheral edema  Neuro nonfocal, somnolent    CBG (last 3)  No results for input(s): GLUCAP in the last 72 hours.   Labs:  Urine Studies No results for input(s): UHGB, CRYS in the last 72 hours.  Invalid input(s): UACOL, UAPR, USPG, UPH, UTP, UGL, UKET, UBIL, UNIT, UROB, ULEU, UEPI, UWBC, Junie Panning [place], [place], [place]  Basic Metabolic Panel:  Recent Labs Lab 08/31/17 0546 09/01/17 0329 09/02/17 0520 09/03/17 0326 09/04/17 0823 09/05/17 0357 09/06/17 0436  NA 130* 132* 131* 132* 135 135 137  K 4.2 3.8 3.1* 4.0 3.7 4.3 5.2*  CL 101 102 98* 99* 101 102 105  CO2 21* 24 24 24 24 22  21*  GLUCOSE 98 84 111* 104* 157* 187* 114*  BUN 15 20 25* 25* 28* 32* 36*  CREATININE 1.11* 1.35* 1.56* 1.19* 0.96 1.05* 0.98  CALCIUM 6.9* 6.5* 6.1* 6.3* 6.8* 6.6* 7.0*  MG 1.5* 1.7 1.5* 1.6*  --  1.8  --   PHOS 1.8* 3.5 4.6 4.0  --   --   --    GFR Estimated Creatinine Clearance: 55.2 mL/min (by C-G formula based on SCr of 0.98 mg/dL). Liver Function Tests:  Recent Labs Lab 09/02/17 0520 09/03/17 0326 09/04/17 0823  09/05/17 0357 09/06/17 0436  AST 73* 75* 34 33 40  ALT 34 29 29 27  37  ALKPHOS 140* 125 138* 123 131*  BILITOT 2.8* 3.0* 2.8* 2.0* 1.7*  PROT 3.3* 3.2* 3.5* 3.3* 3.8*  ALBUMIN 1.5* 1.5* 1.7* 1.6* 1.7*   No results for input(s): LIPASE, AMYLASE in the last 168 hours. No results for input(s): AMMONIA in the last 168 hours. Coagulation profile  Recent Labs Lab 08/30/17 1447 09/03/17 1018  INR 1.23 1.19    CBC:  Recent Labs Lab 09/02/17 0520 09/03/17 0326 09/03/17 1018 09/04/17 0823 09/05/17 0357 09/06/17 0436  WBC 34.3* 27.9*  --  20.9* 8.0 8.1  NEUTROABS 20.6* 18.5*  --  14.2* 6.7 6.6  HGB 9.1* 8.6*  --  8.9* 8.7* 9.7*  HCT 26.8* 24.6*  --  25.2* 25.3* 28.6*  MCV 95.0 94.3  --  94.7 96.9 99.0  PLT 63* 49* 47* 62* 49* 61*   Cardiac Enzymes: No results for input(s): CKTOTAL, CKMB, CKMBINDEX, TROPONINI in the last 168 hours. BNP:  Invalid input(s): POCBNP CBG: No results for input(s): GLUCAP in the last 168 hours. D-Dimer No results for input(s): DDIMER in the last 72 hours. Hgb A1c No results for input(s): HGBA1C in the last 72 hours. Lipid Profile No results for input(s): CHOL, HDL, LDLCALC, TRIG, CHOLHDL, LDLDIRECT in the last 72 hours. Thyroid function studies No results for input(s): TSH, T4TOTAL, T3FREE, THYROIDAB in the last 72 hours.  Invalid input(s): FREET3 Anemia work up No results for input(s): VITAMINB12, FOLATE, FERRITIN, TIBC, IRON, RETICCTPCT in the last 72 hours. Microbiology Recent Results (from the past 240 hour(s))  Culture, Urine     Status: None   Collection Time: 09/01/17  9:07 AM  Result Value Ref Range Status   Specimen Description URINE, RANDOM  Final   Special Requests NONE  Final   Culture NO GROWTH  Final   Report Status 09/02/2017 FINAL  Final  Culture, blood (routine x 2)     Status: None (Preliminary result)   Collection Time: 09/01/17  9:32 AM  Result Value Ref Range Status   Specimen Description BLOOD BLOOD LEFT ARM   Final   Special Requests   Final    IN PEDIATRIC BOTTLE Blood Culture results may not be optimal due to an excessive volume of blood received in culture bottles   Culture NO GROWTH 4 DAYS  Final   Report Status PENDING  Incomplete  Culture, blood (routine x 2)     Status: None (Preliminary result)   Collection Time: 09/01/17  9:32 AM  Result Value Ref Range Status   Specimen Description BLOOD BLOOD LEFT ARM  Final   Special Requests   Final    IN PEDIATRIC BOTTLE Blood Culture results may not be optimal due to an excessive volume of blood received in culture bottles   Culture NO GROWTH 4 DAYS  Final   Report Status PENDING  Incomplete  C difficile quick scan w PCR reflex     Status: None   Collection Time: 09/03/17  2:25 PM  Result Value Ref Range Status   C Diff antigen NEGATIVE NEGATIVE Final   C Diff toxin NEGATIVE NEGATIVE Final   C Diff interpretation No C. difficile detected.  Final    Pathology report  Diagnosis 08/31/2017 Bone Marrow, Aspirate,Biopsy, and Clot, left iliac - HYPERCELLULAR BONE MARROW WITH INVOLVEMENT BY A B-CELL LYMPHOPROLIFERATIVE DISORDER. - SEE COMMENT. PERIPHERAL BLOOD: - NORMOCYTIC-NORMOCHROMIC ANEMIA. - CIRCULATING ATYPICAL LYMPHOID CELLS. - THROMBOCYTOPENIA. Diagnosis Note The bone marrow is prominently involved by a B-cell lymphoproliferative process with morphologic and phenotypic features of large B-cell lymphoma. 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 While a significant proportion of patients with this disorder show at least partial regression in response to drug withdrawal, most require cytotoxic therapy. Clinical and cytogenetic correlation is recommended. (BNS:ecj 09/03/2017).  Studies:  No results found.  Assessment: Kristina Torres is a 71 y.o. female with a history of HTN, gout, HLD and Rheumatoid Arthritis on MTX,  presented with severe right hydronephrosis, fatigue, nausea,.and weakness    1. Diffuse large B cell lymphoma, with bone marrow involvement, stage IV, IPS 4, high risk  2. anemia and thrombocytopenia, secondary to #1 3. Anasarca and hypoalbuminemia  4. RA, MTX on hold for now  5. HTN 6. Right hydronephrosis secondary to ureteral obstruction, status post nephrostomy tube placement 7. Severe hypercalcemia, resolved  8. Nausea and vomiting, anorexia  9. Metabolic encephalopathy, likely secondary to hypercalcemia, resolved  10. HCAP, on antibiotics  11. Pulmonary edema, improved  12. Diarrhea, c-diff negative   Plan:  -lab reviewed, will proceed to chemotherapy Cytoxan and vincristine this afternoon  -continue prednisone until 9/21 -appreciate the excellent care from hospitalist team -I again encourage her to increase her nutrition supplement -PT/OT, and rehab replacement, I anticipate she may be discharged to SNF this weekend  -repeat lab tomorrow including uric acid  -consider loose her fluids restriction some, to prevent tumor lysis syndrome  -she will return to my office next week for follow up and neulasta   I spent a total of 25 mins in her care today, >50% face to face counseling  Truitt Merle, MD 09/06/2017

## 2017-09-06 NOTE — Progress Notes (Signed)
Chemo dosages and calculations checked with another chemo certified nurse Lottie Dawson  RN

## 2017-09-06 NOTE — Care Management Important Message (Signed)
Important Message  Patient Details  Name: Kristina Torres MRN: [id number] Date of Birth: 16-Feb-1946   Medicare Important Message Given:  Yes    Kerin Salen 09/06/2017, 12:01 [place] Message  Patient Details  Name: Kristina Torres MRN: [id number] Date of Birth: October 20, 1946   Medicare Important Message Given:  Yes    Kerin Salen 09/06/2017, 12:00 PM

## 2017-09-06 NOTE — Progress Notes (Signed)
Tolerated chemo well.portacath site no bleeding or hematoma, with good blood return.

## 2017-09-07 ENCOUNTER — Encounter (HOSPITAL_COMMUNITY): Payer: Self-pay | Admitting: Interventional Radiology

## 2017-09-07 DIAGNOSIS — D630 Anemia in neoplastic disease: Secondary | ICD-10-CM

## 2017-09-07 LAB — CBC WITH DIFFERENTIAL/PLATELET
BASOS ABS: 0 10*3/uL (ref 0.0–0.1)
Basophils Relative: 0 %
EOS ABS: 0 10*3/uL (ref 0.0–0.7)
Eosinophils Relative: 0 %
HCT: 24.8 % — ABNORMAL LOW (ref 36.0–46.0)
HEMOGLOBIN: 8.4 g/dL — AB (ref 12.0–15.0)
LYMPHS ABS: 0.9 10*3/uL (ref 0.7–4.0)
Lymphocytes Relative: 16 %
MCH: 33.6 pg (ref 26.0–34.0)
MCHC: 33.9 g/dL (ref 30.0–36.0)
MCV: 99.2 fL (ref 78.0–100.0)
MONO ABS: 0.3 10*3/uL (ref 0.1–1.0)
MONOS PCT: 5 %
NEUTROS ABS: 4.5 10*3/uL (ref 1.7–7.7)
Neutrophils Relative %: 79 %
Platelets: 70 10*3/uL — ABNORMAL LOW (ref 150–400)
RBC: 2.5 MIL/uL — ABNORMAL LOW (ref 3.87–5.11)
RDW: 21.8 % — AB (ref 11.5–15.5)
WBC: 5.7 10*3/uL (ref 4.0–10.5)

## 2017-09-07 LAB — COMPREHENSIVE METABOLIC PANEL
ALK PHOS: 119 U/L (ref 38–126)
ALT: 36 U/L (ref 14–54)
ANION GAP: 12 (ref 5–15)
AST: 36 U/L (ref 15–41)
Albumin: 1.6 g/dL — ABNORMAL LOW (ref 3.5–5.0)
BUN: 45 mg/dL — ABNORMAL HIGH (ref 6–20)
CALCIUM: 6.6 mg/dL — AB (ref 8.9–10.3)
CO2: 22 mmol/L (ref 22–32)
Chloride: 102 mmol/L (ref 101–111)
Creatinine, Ser: 0.93 mg/dL (ref 0.44–1.00)
GFR calc non Af Amer: >60 mL/min (ref >60)
Glucose, Bld: 220 mg/dL — ABNORMAL HIGH (ref 65–99)
Potassium: 5.1 mmol/L (ref 3.5–5.1)
SODIUM: 136 mmol/L (ref 135–145)
TOTAL PROTEIN: 3.3 g/dL — AB (ref 6.5–8.1)
Total Bilirubin: 1.2 mg/dL (ref 0.3–1.2)

## 2017-09-07 MED ORDER — DIPHENOXYLATE-ATROPINE 2.5-0.025 MG PO TABS
1.0000 | ORAL_TABLET | Freq: Four times a day (QID) | ORAL | Status: DC | PRN
  Administered 2017-09-07 – 2017-09-08 (×2): 1 via ORAL
  Filled 2017-09-07 (×2): qty 1

## 2017-09-07 NOTE — Progress Notes (Signed)
PROGRESS NOTE    Kristina Torres  PYP:[id number] DOB: 05-16-1946 DOA: 08/20/2017 PCP: [name], MD   Brief Narrative: 71 y.o. female with fairly complex PMH including hypertension, hyperlipidemia, gout, and rheumatoid arthritis treated with methotrexate was admitted 08/20/17 for evaluation of a one-week history of nausea and vomiting associated with abdominal pain. Initial workup revealed a right renal mass with right hydroureteronephrosis. She was placed on Cipro for UTI and scheduled to see a urologist in follow-up however due to persistent nausea/vomiting she was admitted. CT abdomen/pelvis showed a hypodense mass in the right hepatic lobe, bulky retroperitoneal lymphadenopathy and splenomegaly as well as severe right hydronephrosis and hydroureter. A right percutaneous nephrostomy tube was placed by IR she subsequently underwent a retroperitoneal lymph node biopsy on 08/23/17 with repeat biopsy 08/24/17 ultimately showed high grade lymphoma. She also underwent bone marrow biopsy 08/31/17. She has now begun chemotherapy.  Assessment & Plan:   # DLBCL (diffuse large B cell lymphoma) ([place]) -Received chemotherapy with Cytoxan and vincristine on 9/20, tolerated well. On prednisone. Further lab and chemotherapy deferred to oncologist. -Patient has a Port-A-Cath  -Likely discharge to skilled nursing facility in next 1-2 days.  # Severe right hydronephrosis and hydroureter/AKI Initially treated with nephrostomy tube per IR 08/21/17. Subsequently underwent nephrostogram/nephrostomy tube exchange on 08/31/17 after her percutaneous nephrostomy tube stopped draining. Has been followed by urology and essentially we are waiting for chemotherapy to shrink the mass to definitively treat this with a possible internalized nephrostomy tube with double-J stent. Creatinine is stable at 0.93.  #Rheumatoid arthritis ([place]) Methotrexate has been placed on hold given her newly diagnosed lymphoma.  #  Hypotension/history of Essential hypertension Antihypertensives on hold due to soft blood pressure.  # Gout, stable Continue allopurinol.  # Acute urinary retention: Urine output of 1600 mL in 24 hours. Foley placed. Will need outpatient urology follow-up.  # HLD (hyperlipidemia) Continue statin.  # Nausea & vomiting Resolved.  # Hypercalcemia Likely secondary to malignancy in the setting of medication side effects including Tums, hydrochlorothiazide, calcium/vitamin D supplements. She was hydrated, given Lasix, as well as a dose of pamidronate, calcitonin 4 doses. Calcium now low, correct calcium with albumin.  -monitor lab  # Thrombocytopenia ([place]) Related to lymphoma. Platelet count mildly improved to 70 today. Monitor CBC. No sign of bleeding.  # Normocytic anemia Multifactorial with history of methotrexate/anemia of chronic disease contributory.  -Hemoglobin dropped to 8.4 today.Repeat lab in the morning.  # Electrolyte imbalances including Hypophosphatemia/Hypomagnesemia/Hyponatremia: Patient is on K-Phos. Electrolytes improved. Monitor lab.  # Obesity/malnutrition related to acute illness Body mass index is 41.4 kg/m. Continue nutritional supplements.  #Physical deconditioning: PT OT evaluation. Encourage activity. Discussed with Education officer, museum. Likely transfer her care to a skilled facility and will next 1-2 days. Also discussed with the patient and her sister at bedside.  DVT prophylaxis: SCD Code Status: Full code Family Communication: Discussed with the patient's sister at bedside Disposition Plan: Currently admitted, likely transfer to skilled facility next 1-2 days    Consultants:   Urology  Medical Oncology  Interventional Radiology  Cardiology  ENT  Infectious Diseases   Antimicrobials: None  Subjective: Seen and examined at bedside. Denied headache, dizziness, nausea vomiting chest pain or shortness of breath. Has  weakness.  Objective: Vitals:   09/06/17 1530 09/06/17 2035 09/07/17 0132 09/07/17 0443  BP: 101/60 (!) 103/56  (!) 106/58  Pulse: 86 74  72  Resp: _0 Temp: 98.2 F (36.8 C) 98 F (36.7  C)  98.6 F (37 C)  TempSrc: Oral Oral  Oral  SpO2: 95% 96%  98%  Weight:   97 kg (213 lb 13.5 oz)   Height:        Intake/Output Summary (Last 24 hours) at 09/07/17 1417 Last data filed at 09/07/17 0926  Gross per 24 hour  Intake              820 ml  Output             2050 ml  Net            -1230 ml   Filed Weights   09/04/17 0446 09/06/17 1244 09/07/17 0132  Weight: 96.2 kg (212 lb) 97.7 kg (215 lb 6.2 oz) 97 kg (213 lb 13.5 oz)    Examination:  General exam: Appears calm and comfortable  Respiratory system: Clear to auscultation. Respiratory effort normal. No wheezing or crackle Cardiovascular system: S1 & S2 heard, RRR. Bilateral lower extremities pitting edema, improving as per patient Gastrointestinal system: Abdomen is nondistended, soft and nontender. Normal bowel sounds heard. Central nervous system: Alert and oriented. No focal neurological deficits. Extremities: Symmetric 5 x 5 power. Skin: No rashes, lesions or ulcers Psychiatry: Judgement and insight appear normal. Mood & affect appropriate.     Data Reviewed: I have personally reviewed following labs and imaging studies  CBC:  Recent Labs Lab 09/03/17 0326 09/03/17 1018 09/04/17 0823 09/05/17 0357 09/06/17 0436 09/07/17 0515  WBC 27.9*  --  20.9* 8.0 8.1 5.7  NEUTROABS 18.5*  --  14.2* 6.7 6.6 4.5  HGB 8.6*  --  8.9* 8.7* 9.7* 8.4*  HCT 24.6*  --  25.2* 25.3* 28.6* 24.8*  MCV 94.3  --  94.7 96.9 99.0 99.2  PLT 49* 47* 62* 49* 61* 70*   Basic Metabolic Panel:  Recent Labs Lab 09/01/17 0329 09/02/17 0520 09/03/17 0326 09/04/17 0823 09/05/17 0357 09/06/17 0436 09/07/17 0515  NA 132* 131* 132* 135 135 137 136  K 3.8 3.1* 4.0 3.7 4.3 5.2* 5.1  CL 102 98* 99* 101 102 105 102  CO2 _0 21* 22  GLUCOSE 84 111* 104* 157* 187* 114* 220*  BUN 20 25* 25* 28* 32* 36* 45*  CREATININE 1.35* 1.56* 1.19* 0.96 1.05* 0.98 0.93  CALCIUM 6.5* 6.1* 6.3* 6.8* 6.6* 7.0* 6.6*  MG 1.7 1.5* 1.6*  --  1.8  --   --   PHOS 3.5 4.6 4.0  --   --   --   --    GFR: Estimated Creatinine Clearance: 57.9 mL/min (by C-G formula based on SCr of 0.93 mg/dL). Liver Function Tests:  Recent Labs Lab 09/03/17 0326 09/04/17 0823 09/05/17 0357 09/06/17 0436 09/07/17 0515  AST 75* 34 33 40 36  ALT _1 37 36  ALKPHOS 125 138* 123 131* 119  BILITOT 3.0* 2.8* 2.0* 1.7* 1.2  PROT 3.2* 3.5* 3.3* 3.8* 3.3*  ALBUMIN 1.5* 1.7* 1.6* 1.7* 1.6*   No results for input(s): LIPASE, AMYLASE in the last 168 hours. No results for input(s): AMMONIA in the last 168 hours. Coagulation Profile:  Recent Labs Lab 09/03/17 1018  INR 1.19   Cardiac Enzymes: No results for input(s): CKTOTAL, CKMB, CKMBINDEX, TROPONINI in the last 168 hours. BNP (last 3 results) No results for input(s): PROBNP in the last 8760 hours. HbA1C: No results for input(s): HGBA1C in the last 72 hours. CBG: No results for input(s): GLUCAP in the last 168  hours. Lipid Profile: No results for input(s): CHOL, HDL, LDLCALC, TRIG, CHOLHDL, LDLDIRECT in the last 72 hours. Thyroid Function Tests: No results for input(s): TSH, T4TOTAL, FREET4, T3FREE, THYROIDAB in the last 72 hours. Anemia Panel: No results for input(s): VITAMINB12, FOLATE, FERRITIN, TIBC, IRON, RETICCTPCT in the last 72 hours. Sepsis Labs:  Recent Labs Lab 09/02/17 0520  PROCALCITON 1.78    Recent Results (from the past 240 hour(s))  Culture, Urine     Status: None   Collection Time: 09/01/17  9:07 AM  Result Value Ref Range Status   Specimen Description URINE, RANDOM  Final   Special Requests NONE  Final   Culture NO GROWTH  Final   Report Status 09/02/2017 FINAL  Final  Culture, blood (routine x 2)     Status: None   Collection Time: 09/01/17  9:32 AM   Result Value Ref Range Status   Specimen Description BLOOD BLOOD LEFT ARM  Final   Special Requests   Final    IN PEDIATRIC BOTTLE Blood Culture results may not be optimal due to an excessive volume of blood received in culture bottles   Culture NO GROWTH 5 DAYS  Final   Report Status 09/06/2017 FINAL  Final  Culture, blood (routine x 2)     Status: None   Collection Time: 09/01/17  9:32 AM  Result Value Ref Range Status   Specimen Description BLOOD BLOOD LEFT ARM  Final   Special Requests   Final    IN PEDIATRIC BOTTLE Blood Culture results may not be optimal due to an excessive volume of blood received in culture bottles   Culture NO GROWTH 5 DAYS  Final   Report Status 09/06/2017 FINAL  Final  C difficile quick scan w PCR reflex     Status: None   Collection Time: 09/03/17  2:25 PM  Result Value Ref Range Status   C Diff antigen NEGATIVE NEGATIVE Final   C Diff toxin NEGATIVE NEGATIVE Final   C Diff interpretation No C. difficile detected.  Final         Radiology Studies: Ir Fluoro Guide Port Insertion Right  Result Date: 09/07/2017 CLINICAL DATA:  Lymphoma, needs durable venous access for planned chemotherapy regimen. EXAM: TUNNELED PORT CATHETER PLACEMENT WITH ULTRASOUND AND FLUOROSCOPIC GUIDANCE FLUOROSCOPY TIME:  6 seconds, 0.6 mGy ANESTHESIA/SEDATION: Intravenous Fentanyl and Versed were administered as conscious sedation during continuous monitoring of the patient's level of consciousness and physiological / cardiorespiratory status by the radiology RN, with a total moderate sedation time of 13 minutes. TECHNIQUE: The procedure, risks, benefits, and alternatives were explained to the patient. Questions regarding the procedure were encouraged and answered. The patient understands and consents to the procedure. As antibiotic prophylaxis, cefazolin 2 g K Dr. has was ordered pre-procedure and administered intravenously within one hour of incision. Patency of the right IJ vein was  confirmed with ultrasound with image documentation. An appropriate skin site was determined. Skin site was marked. Region was prepped using maximum barrier technique including cap and mask, sterile gown, sterile gloves, large sterile sheet, and Chlorhexidine as cutaneous antisepsis. The region was infiltrated locally with 1% lidocaine. Under real-time ultrasound guidance, the right IJ vein was accessed with a 21 gauge micropuncture needle; the needle tip within the vein was confirmed with ultrasound image documentation. Needle was exchanged over a 018 guidewire for transitional dilator which allowed passage of the [facility] wire into the IVC. Over this, the transitional dilator was exchanged for a 5 [place] MPA catheter.  A small incision was made on the right anterior chest wall and a subcutaneous pocket fashioned. The power-injectable port was positioned and its catheter tunneled to the right IJ dermatotomy site. The MPA catheter was exchanged over an Amplatz wire for a peel-away sheath, through which the port catheter, which had been trimmed to the appropriate length, was advanced and positioned under fluoroscopy with its tip at the cavoatrial junction. Spot chest radiograph confirms good catheter position and no pneumothorax. The pocket was closed with deep interrupted and subcuticular continuous 3-0 Monocryl sutures. The port was flushed per protocol. The incisions were covered with Dermabond then covered with a sterile dressing. COMPLICATIONS: COMPLICATIONS None immediate IMPRESSION: Technically successful right IJ power-injectable port catheter placement. Ready for routine use. Electronically Signed   By: Lucrezia Europe M.D.   On: 09/07/2017 08:59        Scheduled Meds: . allopurinol  300 mg Oral Daily  . dextromethorphan-guaiFENesin  1 tablet Oral BID  . feeding supplement  1 Container Oral TID BM  . pantoprazole  40 mg Oral Daily  . phosphorus  500 mg Oral TID  . simvastatin  20 mg Oral Daily    Continuous Infusions: . sodium chloride    . sodium chloride    . sodium chloride    . famotidine    . famotidine    . magnesium sulfate 1 - 4 g bolus IVPB 2 g (09/03/17 0922)     LOS: 18 days     Tanna Furry, MD [facility] Pager [phone]  If 7PM-7AM, please contact night-coverage [email or web address] Password [email or web address] 09/07/2017, 2:17 PM

## 2017-09-07 NOTE — Care Management Note (Signed)
Case Management Note  Patient Details  Name: Kristina Torres MRN: [id number] Date of Birth: [birth year]  Subjective/Objective:                  Chemotherapy  Action/Plan: September 06, 2017 Velva Harman, BSN, [place],  [place] Chart reviewed for case management and discharge needs. Next review date: [id number] Expected Discharge Date:  08/24/17               Expected Discharge Plan:  [place]  In-House Referral:     Discharge planning Services  CM Consult  Post Acute Care Choice:    Choice offered to:     DME Arranged:    DME Agency:     HH Arranged:    HH Agency:     Status of Service:  In process, will continue to follow  If discussed at Long Length of Stay Meetings, dates discussed:    Additional Comments:  Leeroy Cha, RN 09/07/2017, 8:49 AM

## 2017-09-07 NOTE — Progress Notes (Signed)
PT Cancellation Note  Patient Details Name: Janautica Netzley MRN: [id number] DOB: Sep 19, 1946   Cancelled Treatment:     pt declined any OOB activity due to uncontrolled loose stools.  "Not today" she repeated.    Rica Koyanagi  PTA [facility]  Acute  Rehab Pager      [phone]

## 2017-09-07 NOTE — Progress Notes (Signed)
Birdie Fetty   DOB:1946-07-02   HB#:[id number]   YBO#:[id number]  Hematology and Oncology service follow up note   Subjective: Pt tolerated chemo very well yesterday, no nausea, or infusion reaction. She has multiple loose BM today, and did not want to do PT.  She is afebrile, VS stable.  Objective:  Vitals:   09/07/17 0443 09/07/17 1425  BP: (!) 106/58 (!) 92/48  Pulse: 72 88  Resp: 16 16  Temp: 98.6 F (37 C) 97.9 F (36.6 C)  SpO2: 98% 93%    Body mass index is 41.76 kg/m.  Intake/Output Summary (Last 24 hours) at 09/07/17 2013 Last data filed at 09/07/17 1425  Gross per 24 hour  Intake              600 ml  Output             2500 ml  Net            -1900 ml     Sclerae unicteric  Oropharynx clear  No peripheral adenopathy In her neck, supraclavicular, axilla or groin area  Lungs clear -- no rales or rhonchi  Heart regular rate and rhythm  Abdomen benign  MSK no focal spinal tenderness, no peripheral edema  Neuro nonfocal, somnolent    CBG (last 3)  No results for input(s): GLUCAP in the last 72 hours.   Labs:  Urine Studies No results for input(s): UHGB, CRYS in the last 72 hours.  Invalid input(s): UACOL, UAPR, USPG, UPH, UTP, UGL, UKET, UBIL, UNIT, UROB, ULEU, UEPI, UWBC, Junie Panning [place], [place], [place]  Basic Metabolic Panel:  Recent Labs Lab 09/01/17 0329 09/02/17 0520 09/03/17 0326 09/04/17 0823 09/05/17 0357 09/06/17 0436 09/07/17 0515  NA 132* 131* 132* 135 135 137 136  K 3.8 3.1* 4.0 3.7 4.3 5.2* 5.1  CL 102 98* 99* 101 102 105 102  CO2 24 24 24 24 22  21* 22  GLUCOSE 84 111* 104* 157* 187* 114* 220*  BUN 20 25* 25* 28* 32* 36* 45*  CREATININE 1.35* 1.56* 1.19* 0.96 1.05* 0.98 0.93  CALCIUM 6.5* 6.1* 6.3* 6.8* 6.6* 7.0* 6.6*  MG 1.7 1.5* 1.6*  --  1.8  --   --   PHOS 3.5 4.6 4.0  --   --   --   --    GFR Estimated Creatinine Clearance: 57.9 mL/min (by C-G formula based on SCr of 0.93 mg/dL). Liver Function Tests:  Recent Labs Lab  09/03/17 0326 09/04/17 0823 09/05/17 0357 09/06/17 0436 09/07/17 0515  AST 75* 34 33 40 36  ALT 29 29 27  37 36  ALKPHOS 125 138* 123 131* 119  BILITOT 3.0* 2.8* 2.0* 1.7* 1.2  PROT 3.2* 3.5* 3.3* 3.8* 3.3*  ALBUMIN 1.5* 1.7* 1.6* 1.7* 1.6*   No results for input(s): LIPASE, AMYLASE in the last 168 hours. No results for input(s): AMMONIA in the last 168 hours. Coagulation profile  Recent Labs Lab 09/03/17 1018  INR 1.19    CBC:  Recent Labs Lab 09/03/17 0326 09/03/17 1018 09/04/17 0823 09/05/17 0357 09/06/17 0436 09/07/17 0515  WBC 27.9*  --  20.9* 8.0 8.1 5.7  NEUTROABS 18.5*  --  14.2* 6.7 6.6 4.5  HGB 8.6*  --  8.9* 8.7* 9.7* 8.4*  HCT 24.6*  --  25.2* 25.3* 28.6* 24.8*  MCV 94.3  --  94.7 96.9 99.0 99.2  PLT 49* 47* 62* 49* 61* 70*   Cardiac Enzymes: No results for input(s): CKTOTAL, CKMB, CKMBINDEX, TROPONINI  in the last 168 hours. BNP: Invalid input(s): POCBNP CBG: No results for input(s): GLUCAP in the last 168 hours. D-Dimer No results for input(s): DDIMER in the last 72 hours. Hgb A1c No results for input(s): HGBA1C in the last 72 hours. Lipid Profile No results for input(s): CHOL, HDL, LDLCALC, TRIG, CHOLHDL, LDLDIRECT in the last 72 hours. Thyroid function studies No results for input(s): TSH, T4TOTAL, T3FREE, THYROIDAB in the last 72 hours.  Invalid input(s): FREET3 Anemia work up No results for input(s): VITAMINB12, FOLATE, FERRITIN, TIBC, IRON, RETICCTPCT in the last 72 hours. Microbiology Recent Results (from the past 240 hour(s))  Culture, Urine     Status: None   Collection Time: 09/01/17  9:07 AM  Result Value Ref Range Status   Specimen Description URINE, RANDOM  Final   Special Requests NONE  Final   Culture NO GROWTH  Final   Report Status 09/02/2017 FINAL  Final  Culture, blood (routine x 2)     Status: None   Collection Time: 09/01/17  9:32 AM  Result Value Ref Range Status   Specimen Description BLOOD BLOOD LEFT ARM  Final    Special Requests   Final    IN PEDIATRIC BOTTLE Blood Culture results may not be optimal due to an excessive volume of blood received in culture bottles   Culture NO GROWTH 5 DAYS  Final   Report Status 09/06/2017 FINAL  Final  Culture, blood (routine x 2)     Status: None   Collection Time: 09/01/17  9:32 AM  Result Value Ref Range Status   Specimen Description BLOOD BLOOD LEFT ARM  Final   Special Requests   Final    IN PEDIATRIC BOTTLE Blood Culture results may not be optimal due to an excessive volume of blood received in culture bottles   Culture NO GROWTH 5 DAYS  Final   Report Status 09/06/2017 FINAL  Final  C difficile quick scan w PCR reflex     Status: None   Collection Time: 09/03/17  2:25 PM  Result Value Ref Range Status   C Diff antigen NEGATIVE NEGATIVE Final   C Diff toxin NEGATIVE NEGATIVE Final   C Diff interpretation No C. difficile detected.  Final    Pathology report  Diagnosis 08/31/2017 Bone Marrow, Aspirate,Biopsy, and Clot, left iliac - HYPERCELLULAR BONE MARROW WITH INVOLVEMENT BY A B-CELL LYMPHOPROLIFERATIVE DISORDER. - SEE COMMENT. PERIPHERAL BLOOD: - NORMOCYTIC-NORMOCHROMIC ANEMIA. - CIRCULATING ATYPICAL LYMPHOID CELLS. - THROMBOCYTOPENIA. Diagnosis Note The bone marrow is prominently involved by a B-cell lymphoproliferative process with morphologic and phenotypic features of large B-cell lymphoma. 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 While a significant proportion of patients with this disorder show at least partial regression in response to drug withdrawal, most require cytotoxic therapy. Clinical and cytogenetic correlation is recommended. (BNS:ecj 09/03/2017).  Studies:  Ir Fluoro Guide Port Insertion Right  Result Date: 09/07/2017 CLINICAL DATA:  Lymphoma, needs durable venous access for planned chemotherapy regimen.  EXAM: TUNNELED PORT CATHETER PLACEMENT WITH ULTRASOUND AND FLUOROSCOPIC GUIDANCE FLUOROSCOPY TIME:  6 seconds, 0.6 mGy ANESTHESIA/SEDATION: Intravenous Fentanyl and Versed were administered as conscious sedation during continuous monitoring of the patient's level of consciousness and physiological / cardiorespiratory status by the radiology RN, with a total moderate sedation time of 13 minutes. TECHNIQUE: The procedure, risks, benefits, and alternatives were explained to the patient. Questions regarding the procedure were encouraged and answered. The patient understands and consents  to the procedure. As antibiotic prophylaxis, cefazolin 2 g K Dr. has was ordered pre-procedure and administered intravenously within one hour of incision. Patency of the right IJ vein was confirmed with ultrasound with image documentation. An appropriate skin site was determined. Skin site was marked. Region was prepped using maximum barrier technique including cap and mask, sterile gown, sterile gloves, large sterile sheet, and Chlorhexidine as cutaneous antisepsis. The region was infiltrated locally with 1% lidocaine. Under real-time ultrasound guidance, the right IJ vein was accessed with a 21 gauge micropuncture needle; the needle tip within the vein was confirmed with ultrasound image documentation. Needle was exchanged over a 018 guidewire for transitional dilator which allowed passage of the [facility] wire into the IVC. Over this, the transitional dilator was exchanged for a 5 [place] MPA catheter. A small incision was made on the right anterior chest wall and a subcutaneous pocket fashioned. The power-injectable port was positioned and its catheter tunneled to the right IJ dermatotomy site. The MPA catheter was exchanged over an Amplatz wire for a peel-away sheath, through which the port catheter, which had been trimmed to the appropriate length, was advanced and positioned under fluoroscopy with its tip at the cavoatrial junction.  Spot chest radiograph confirms good catheter position and no pneumothorax. The pocket was closed with deep interrupted and subcuticular continuous 3-0 Monocryl sutures. The port was flushed per protocol. The incisions were covered with Dermabond then covered with a sterile dressing. COMPLICATIONS: COMPLICATIONS None immediate IMPRESSION: Technically successful right IJ power-injectable port catheter placement. Ready for routine use. Electronically Signed   By: Lucrezia Europe M.D.   On: 09/07/2017 08:59    Assessment: Kiley Solimine is a 71 y.o. female with a history of HTN, gout, HLD and Rheumatoid Arthritis on MTX, presented with severe right hydronephrosis, fatigue, nausea,.and weakness    1. Diffuse large B cell lymphoma, with bone marrow involvement, stage IV, IPS 4, high risk  2. anemia and thrombocytopenia, secondary to #1 3. Anasarca and hypoalbuminemia  4. RA, MTX on hold for now  5. HTN 6. Right hydronephrosis secondary to ureteral obstruction, status post nephrostomy tube placement 7. Severe hypercalcemia, resolved  8. Nausea and vomiting, anorexia  9. Metabolic encephalopathy, likely secondary to hypercalcemia, resolved  10. HCAP, completed course of antibiotics  11. Pulmonary edema, improved  12. Diarrhea, c-diff negative   Plan:  -she has completed first cycle chemo R-CVP, last dose prednisone today  -lab reviewed, Hg 8.4g/dl today, consider 1-2 U of H/H worse tomorrow, I anticipate her anemia will get worse next week due to chemo -Plan to give neulasta next week as outpt -OK to use imodium or lomotil for diarrhea, possible related to chemo. Will repeat c-diff if diarrhea gets worse, develops fever or leukocytosis  -I strongly encourage her to do PT  -If she is going to SNF this weekend, I will arrange her clinic f/u for next week  -I updated her husband at bedside today, and suggested him to ware mask dur to his URI  -please call my on-call partner if there is any questions  during the weekend.   I spent a total of 25 mins in her care today, >50% face to face counseling  Truitt Merle, MD 09/07/2017

## 2017-09-08 DIAGNOSIS — N390 Urinary tract infection, site not specified: Secondary | ICD-10-CM | POA: Diagnosis not present

## 2017-09-08 DIAGNOSIS — R2689 Other abnormalities of gait and mobility: Secondary | ICD-10-CM | POA: Diagnosis not present

## 2017-09-08 DIAGNOSIS — E785 Hyperlipidemia, unspecified: Secondary | ICD-10-CM | POA: Diagnosis present

## 2017-09-08 DIAGNOSIS — T83022A Displacement of nephrostomy catheter, initial encounter: Secondary | ICD-10-CM | POA: Diagnosis not present

## 2017-09-08 DIAGNOSIS — M6281 Muscle weakness (generalized): Secondary | ICD-10-CM | POA: Diagnosis not present

## 2017-09-08 DIAGNOSIS — B9689 Other specified bacterial agents as the cause of diseases classified elsewhere: Secondary | ICD-10-CM | POA: Diagnosis not present

## 2017-09-08 DIAGNOSIS — J90 Pleural effusion, not elsewhere classified: Secondary | ICD-10-CM | POA: Diagnosis not present

## 2017-09-08 DIAGNOSIS — D630 Anemia in neoplastic disease: Secondary | ICD-10-CM | POA: Diagnosis present

## 2017-09-08 DIAGNOSIS — R112 Nausea with vomiting, unspecified: Secondary | ICD-10-CM | POA: Diagnosis not present

## 2017-09-08 DIAGNOSIS — K5900 Constipation, unspecified: Secondary | ICD-10-CM | POA: Diagnosis not present

## 2017-09-08 DIAGNOSIS — J9601 Acute respiratory failure with hypoxia: Secondary | ICD-10-CM | POA: Diagnosis not present

## 2017-09-08 DIAGNOSIS — N131 Hydronephrosis with ureteral stricture, not elsewhere classified: Secondary | ICD-10-CM | POA: Diagnosis present

## 2017-09-08 DIAGNOSIS — D61810 Antineoplastic chemotherapy induced pancytopenia: Secondary | ICD-10-CM | POA: Diagnosis not present

## 2017-09-08 DIAGNOSIS — E875 Hyperkalemia: Secondary | ICD-10-CM | POA: Diagnosis not present

## 2017-09-08 DIAGNOSIS — A419 Sepsis, unspecified organism: Secondary | ICD-10-CM | POA: Diagnosis not present

## 2017-09-08 DIAGNOSIS — Z936 Other artificial openings of urinary tract status: Secondary | ICD-10-CM | POA: Diagnosis not present

## 2017-09-08 DIAGNOSIS — R2681 Unsteadiness on feet: Secondary | ICD-10-CM | POA: Diagnosis not present

## 2017-09-08 DIAGNOSIS — R591 Generalized enlarged lymph nodes: Secondary | ICD-10-CM | POA: Diagnosis not present

## 2017-09-08 DIAGNOSIS — R488 Other symbolic dysfunctions: Secondary | ICD-10-CM | POA: Diagnosis not present

## 2017-09-08 DIAGNOSIS — D649 Anemia, unspecified: Secondary | ICD-10-CM | POA: Diagnosis not present

## 2017-09-08 DIAGNOSIS — E876 Hypokalemia: Secondary | ICD-10-CM | POA: Diagnosis not present

## 2017-09-08 DIAGNOSIS — M069 Rheumatoid arthritis, unspecified: Secondary | ICD-10-CM | POA: Diagnosis present

## 2017-09-08 DIAGNOSIS — N139 Obstructive and reflux uropathy, unspecified: Secondary | ICD-10-CM | POA: Diagnosis not present

## 2017-09-08 DIAGNOSIS — I110 Hypertensive heart disease with heart failure: Secondary | ICD-10-CM | POA: Diagnosis present

## 2017-09-08 DIAGNOSIS — I2510 Atherosclerotic heart disease of native coronary artery without angina pectoris: Secondary | ICD-10-CM | POA: Diagnosis present

## 2017-09-08 DIAGNOSIS — Z6841 Body Mass Index (BMI) 40.0 and over, adult: Secondary | ICD-10-CM | POA: Diagnosis not present

## 2017-09-08 DIAGNOSIS — I10 Essential (primary) hypertension: Secondary | ICD-10-CM | POA: Diagnosis not present

## 2017-09-08 DIAGNOSIS — R0602 Shortness of breath: Secondary | ICD-10-CM | POA: Diagnosis not present

## 2017-09-08 DIAGNOSIS — R578 Other shock: Secondary | ICD-10-CM | POA: Diagnosis not present

## 2017-09-08 DIAGNOSIS — Z79899 Other long term (current) drug therapy: Secondary | ICD-10-CM | POA: Diagnosis not present

## 2017-09-08 DIAGNOSIS — T451X5A Adverse effect of antineoplastic and immunosuppressive drugs, initial encounter: Secondary | ICD-10-CM | POA: Diagnosis not present

## 2017-09-08 DIAGNOSIS — I959 Hypotension, unspecified: Secondary | ICD-10-CM | POA: Diagnosis not present

## 2017-09-08 DIAGNOSIS — C8338 Diffuse large B-cell lymphoma, lymph nodes of multiple sites: Secondary | ICD-10-CM | POA: Diagnosis not present

## 2017-09-08 DIAGNOSIS — N179 Acute kidney failure, unspecified: Secondary | ICD-10-CM | POA: Diagnosis not present

## 2017-09-08 DIAGNOSIS — Y732 Prosthetic and other implants, materials and accessory gastroenterology and urology devices associated with adverse incidents: Secondary | ICD-10-CM | POA: Diagnosis present

## 2017-09-08 DIAGNOSIS — D696 Thrombocytopenia, unspecified: Secondary | ICD-10-CM | POA: Diagnosis not present

## 2017-09-08 DIAGNOSIS — D500 Iron deficiency anemia secondary to blood loss (chronic): Secondary | ICD-10-CM | POA: Diagnosis not present

## 2017-09-08 DIAGNOSIS — C8330 Diffuse large B-cell lymphoma, unspecified site: Secondary | ICD-10-CM | POA: Diagnosis present

## 2017-09-08 DIAGNOSIS — R031 Nonspecific low blood-pressure reading: Secondary | ICD-10-CM | POA: Diagnosis not present

## 2017-09-08 DIAGNOSIS — C8250 Diffuse follicle center lymphoma, unspecified site: Secondary | ICD-10-CM | POA: Diagnosis not present

## 2017-09-08 DIAGNOSIS — R918 Other nonspecific abnormal finding of lung field: Secondary | ICD-10-CM | POA: Diagnosis not present

## 2017-09-08 DIAGNOSIS — E43 Unspecified severe protein-calorie malnutrition: Secondary | ICD-10-CM | POA: Diagnosis present

## 2017-09-08 DIAGNOSIS — R1110 Vomiting, unspecified: Secondary | ICD-10-CM | POA: Diagnosis not present

## 2017-09-08 DIAGNOSIS — I5033 Acute on chronic diastolic (congestive) heart failure: Secondary | ICD-10-CM | POA: Diagnosis present

## 2017-09-08 LAB — RENAL FUNCTION PANEL
ALBUMIN: 1.7 g/dL — AB (ref 3.5–5.0)
Anion gap: 10 (ref 5–15)
BUN: 51 mg/dL — ABNORMAL HIGH (ref 6–20)
CHLORIDE: 104 mmol/L (ref 101–111)
CO2: 23 mmol/L (ref 22–32)
CREATININE: 0.84 mg/dL (ref 0.44–1.00)
Calcium: 6.7 mg/dL — ABNORMAL LOW (ref 8.9–10.3)
Glucose, Bld: 163 mg/dL — ABNORMAL HIGH (ref 65–99)
Phosphorus: 8.8 mg/dL — ABNORMAL HIGH (ref 2.5–4.6)
Potassium: 4.7 mmol/L (ref 3.5–5.1)
Sodium: 137 mmol/L (ref 135–145)

## 2017-09-08 LAB — CBC
HEMATOCRIT: 24.4 % — AB (ref 36.0–46.0)
HEMOGLOBIN: 8.3 g/dL — AB (ref 12.0–15.0)
MCH: 33.3 pg (ref 26.0–34.0)
MCHC: 34 g/dL (ref 30.0–36.0)
MCV: 98 fL (ref 78.0–100.0)
Platelets: 71 10*3/uL — ABNORMAL LOW (ref 150–400)
RBC: 2.49 MIL/uL — ABNORMAL LOW (ref 3.87–5.11)
RDW: 20.9 % — ABNORMAL HIGH (ref 11.5–15.5)
WBC: 5 10*3/uL (ref 4.0–10.5)

## 2017-09-08 LAB — MAGNESIUM: Magnesium: 1.8 mg/dL (ref 1.7–2.4)

## 2017-09-08 LAB — URIC ACID: URIC ACID, SERUM: 4.6 mg/dL (ref 2.3–6.6)

## 2017-09-08 MED ORDER — ACETAMINOPHEN 325 MG PO TABS: 650.0000 mg | ORAL_TABLET | Freq: Four times a day (QID) | ORAL | Status: DC | PRN

## 2017-09-08 MED ORDER — FUROSEMIDE 20 MG PO TABS: 20.0000 mg | ORAL_TABLET | Freq: Every day | ORAL | 0 refills | Status: DC

## 2017-09-08 MED ORDER — PANTOPRAZOLE SODIUM 40 MG PO TBEC: 40.0000 mg | DELAYED_RELEASE_TABLET | Freq: Every day | ORAL | Status: DC

## 2017-09-08 MED ORDER — DM-GUAIFENESIN ER 30-600 MG PO TB12: 1.0000 | ORAL_TABLET | Freq: Two times a day (BID) | ORAL | 0 refills | Status: DC | PRN

## 2017-09-08 MED ORDER — DIPHENOXYLATE-ATROPINE 2.5-0.025 MG PO TABS: 1.0000 | ORAL_TABLET | Freq: Four times a day (QID) | ORAL | 0 refills | Status: DC | PRN

## 2017-09-08 NOTE — Clinical Social Work Placement (Signed)
   CLINICAL SOCIAL WORK PLACEMENT  NOTE  Date:  09/08/2017  Patient Details  Name: Kristina Torres MRN: [id number] Date of Birth: 08-14-1946  Clinical Social Work is seeking post-discharge placement for this patient at the [place] level of care (*CSW will initial, date and re-position this form in  chart as items are completed):  Yes   Patient/family provided with [place] Work Department's list of facilities offering this level of care within the geographic area requested by the patient (or if unable, by the patient's family).  Yes   Patient/family informed of their freedom to choose among providers that offer the needed level of care, that participate in Medicare, Medicaid or managed care program needed by the patient, have an available bed and are willing to accept the patient.  Yes   Patient/family informed of [place]'s ownership interest in [facility] and [facility], as well as of the fact that they are under no obligation to receive care at these facilities.  PASRR submitted to EDS on 08/26/17     PASRR number received on 08/26/17     Existing PASRR number confirmed on       FL2 transmitted to all facilities in geographic area requested by pt/family on 08/26/17     FL2 transmitted to all facilities within larger geographic area on       Patient informed that his/her managed care company has contracts with or will negotiate with certain facilities, including the following:        Yes   Patient/family informed of bed offers received.  Patient chooses bed at [facility]     Physician recommends and patient chooses bed at      Patient to be transferred to [facility] on 09/08/17.  Patient to be transferred to facility by ptar     Patient family notified on 09/08/17 of transfer.  Name of family member notified:  Hiram     PHYSICIAN Please sign FL2     Additional Comment:     _______________________________________________ Jorge Ny, LCSW 09/08/2017, 12:33 PM

## 2017-09-08 NOTE — Progress Notes (Signed)
Patient will discharge to [facility] SN[facility] Anticipated discharge date: 9/22 Family notified: at bedside Transportation by PTAR- requested at [place] signing off.  Jorge Ny, LCSW Clinical Social Worker [phone]

## 2017-09-08 NOTE — Progress Notes (Signed)
Pt discharged to care of [facility]. Discharge education complete, paper rx's sent with pt in discharge packet. Pt transported via [facility]. Report called to Cecille Rubin, Therapist, sports of [employer].

## 2017-09-08 NOTE — Discharge Summary (Signed)
Physician Discharge Summary  Candise Crabtree OYD:[id number] DOB: May 29, 1946 DOA: 08/20/2017  PCP: [name], MD  Admit date: 08/20/2017 Discharge date: 09/08/2017  Admitted From:home Disposition:SNF  Recommendations for Outpatient Follow-up:  1. Follow up with PCP in 1-2 weeks 2. Please obtain BMP/CBC in 3-4 days and follow up with Dr. Burr Medico from oncology  Home Health:SNF Equipment/Devices:nephrostomy tube, foley catheter Discharge Condition:stable CODE STATUS:full code Diet recommendation:heart healthy  Brief/Interim Summary: 71 y.o.femalewith fairly complex PMH including hypertension, hyperlipidemia, gout, and rheumatoid arthritis treated with methotrexate was admitted 08/20/17 for evaluation of a one-week history of nausea and vomiting associated with abdominal pain. Initial workup revealed a right renal mass with right hydroureteronephrosis. She was placed on Cipro for UTI and scheduled to see a urologist in follow-up however due to persistent nausea/vomiting she was admitted. CT abdomen/pelvis showed a hypodense mass in the right hepatic lobe, bulky retroperitoneal lymphadenopathy and splenomegaly as well as severe right hydronephrosis and hydroureter. A right percutaneous nephrostomy tube was placed by IR she subsequently underwent a retroperitoneal lymph node biopsy on 08/23/17 with repeat biopsy 08/24/17 ultimately showed high grade lymphoma. She also underwent bone marrow biopsy 08/31/17.Started chemotherapy by oncologist.  # DLBCL (diffuse large B cell lymphoma) ([place]) -Received chemotherapy with Cytoxan and vincristine on 9/20, tolerated well.  -Patient has a Port-A-Cath  -Recommended to monitor labs next week and follow up with oncologist.  # Severe right hydronephrosis and hydroureter/AKI Initially treated with nephrostomy tube per IR 08/21/17. Subsequently underwent nephrostogram/nephrostomy tube exchange on 08/31/17 after her percutaneous nephrostomy tube stopped draining. Has  been followed by urology and essentially we are waiting for chemotherapy to shrink the mass to definitively treat this with a possible internalized nephrostomy tube with double-J stent. Creatinine is stable at 0.84.  #Rheumatoid arthritis ([place]) Methotrexate has been placed on hold given her newly diagnosed lymphoma. Follow-up with PCP.  # Hypotension/history of Essential hypertension Antihypertensives on hold due to soft blood pressure. Low-dose Lasix for the management of lower extremity edema depending on blood pressure.  #Gout, stable Continue allopurinol.  #Acute urinary retention:  Has foley catheter. Will need outpatient urology follow-up.  #HLD (hyperlipidemia) Continue statin.  #Nausea &vomiting Resolved.  #Hypercalcemia Likely secondary to malignancy in the setting of medication side effects including Tums, hydrochlorothiazide, calcium/vitamin D supplements. She was hydrated, given Lasix, as well as a dose of pamidronate, calcitonin 4 doses.  -Calcium level corrected. Discontinue hydrochlorothiazide and started on low-dose oral Lasix for the management of lower extremity edema. Recommended to monitor lab.  #Thrombocytopenia ([place]) Related to lymphoma. Platelet count stable at 71 today. No sign of bleeding. Monitor labs.  #Normocytic anemia Multifactorial with history of methotrexate/anemia of chronic disease contributory.  -Hemoglobin level is stable at 8.3 today. Recommended to check lab early next week and follow up with oncologist.  #Electrolyte imbalances including Hypophosphatemia/Hypomagnesemia/Hyponatremia: Improved. Discontinue K-Phos.  #Obesity/malnutrition related to acute illness Body mass index is 41.4 kg/m.Continue nutritional supplements.  #Physical deconditioning: PT OT evaluation. Encourage activity. Discussed with Education officer, museum. Plan to transfer her care to a skilled facility today.  Patient is clinically stable. Denied headache,  dizziness, nausea, vomiting, chest pain, shortness of breath. Diarrhea has improved with Lomotil. No abdominal tenderness. Discussed with the patient and her husband at bedside. I discussed with the patient regarding closely monitoring for fever nausea vomiting, diarrhea. Patient understands to call Doctor or come to ER for any new symptoms.  Discharge Diagnoses:  Principal Problem:   DLBCL (diffuse large B cell lymphoma) (HCC) Active Problems:  Rheumatoid arthritis ([place])   Essential hypertension   Gout   UTI (urinary tract infection)   Sepsis (HCC)   HLD (hyperlipidemia)   Hypotension   AKI (acute kidney injury) ([place])   Nausea & vomiting   Hypercalcemia   Thrombocytopenia (HCC)   Cough   Macrocytic anemia   Lymphadenopathy   Hypophosphatemia   Hypomagnesemia   Hyponatremia   Acute urinary retention   Hyperbilirubinemia   Cytopenia    Discharge Instructions  Discharge Instructions    Call MD for:  difficulty breathing, headache or visual disturbances    Complete by:  As directed    Call MD for:  extreme fatigue    Complete by:  As directed    Call MD for:  hives    Complete by:  As directed    Call MD for:  persistant dizziness or light-headedness    Complete by:  As directed    Call MD for:  persistant nausea and vomiting    Complete by:  As directed    Call MD for:  severe uncontrolled pain    Complete by:  As directed    Call MD for:  temperature >100.4    Complete by:  As directed    Diet - low sodium heart healthy    Complete by:  As directed    Increase activity slowly    Complete by:  As directed    PHYSICIAN COMMUNICATION ORDER    Complete by:  As directed    Hepatitis B Virus screening with HBsAg and anti-HBc recommended prior to treatment with rituximab (Rituxan), ofatumumab (Arzerra) or obinutuzumab Dyann Kief).     Allergies as of 09/08/2017      Reactions   Streptomycin Anaphylaxis      Medication List    STOP taking these medications   CALCIUM  1000 + D PO   ciprofloxacin 250 MG tablet Commonly known as:  CIPRO   hydrochlorothiazide 12.5 MG capsule Commonly known as:  MICROZIDE   methotrexate 2.5 MG tablet Commonly known as:  RHEUMATREX     TAKE these medications   acetaminophen 325 MG tablet Commonly known as:  TYLENOL Take 2 tablets (650 mg total) by mouth every 6 (six) hours as needed for mild pain (or Fever >/= 101).   allopurinol 300 MG tablet Commonly known as:  ZYLOPRIM Take 300 mg by mouth daily.   dextromethorphan-guaiFENesin 30-600 MG 12hr tablet Commonly known as:  MUCINEX DM Take 1 tablet by mouth 2 (two) times daily as needed for cough.   diphenoxylate-atropine 2.5-0.025 MG tablet Commonly known as:  LOMOTIL Take 1 tablet by mouth 4 (four) times daily as needed for diarrhea or loose stools.   furosemide 20 MG tablet Commonly known as:  LASIX Take 1 tablet (20 mg total) by mouth daily.   ondansetron 4 MG disintegrating tablet Commonly known as:  ZOFRAN-ODT Take 4 mg by mouth as needed.   pantoprazole 40 MG tablet Commonly known as:  PROTONIX Take 1 tablet (40 mg total) by mouth daily.   simvastatin 20 MG tablet Commonly known as:  ZOCOR Take 20 mg by mouth daily.            Discharge Care Instructions        Start     Ordered   09/09/17 0000  pantoprazole (PROTONIX) 40 MG tablet  Daily     09/08/17 1144   09/08/17 0000  acetaminophen (TYLENOL) 325 MG tablet  Every 6 hours PRN  09/08/17 1144   09/08/17 0000  dextromethorphan-guaiFENesin (MUCINEX DM) 30-600 MG 12hr tablet  2 times daily PRN     09/08/17 1144   09/08/17 0000  diphenoxylate-atropine (LOMOTIL) 2.5-0.025 MG tablet  4 times daily PRN     09/08/17 1144   09/08/17 0000  Increase activity slowly     09/08/17 1144   09/08/17 0000  Diet - low sodium heart healthy     09/08/17 1144   09/08/17 0000  Call MD for:  temperature >100.4     09/08/17 1144   09/08/17 0000  Call MD for:  persistant nausea and vomiting      09/08/17 1144   09/08/17 0000  Call MD for:  severe uncontrolled pain     09/08/17 1144   09/08/17 0000  Call MD for:  difficulty breathing, headache or visual disturbances     09/08/17 1144   09/08/17 0000  Call MD for:  hives     09/08/17 1144   09/08/17 0000  Call MD for:  persistant dizziness or light-headedness     09/08/17 1144   09/08/17 0000  Call MD for:  extreme fatigue     09/08/17 1144   09/08/17 0000  furosemide (LASIX) 20 MG tablet  Daily     09/08/17 1149   09/04/17 0000  PHYSICIAN COMMUNICATION ORDER    Comments:  Hepatitis B Virus screening with HBsAg and anti-HBc recommended prior to treatment with rituximab (Rituxan), ofatumumab (Arzerra) or obinutuzumab Dyann Kief).   09/04/17 6269      Contact information for follow-up providers    Kathie Rhodes, MD. Schedule an appointment as soon as possible for a visit in 1 month(s).   Specialty:  Urology Contact information: [place] [address] [phone]        [name], MD. Schedule an appointment as soon as possible for a visit in 1 week(s).   Specialty:  Internal Medicine Contact information: [place]. [place] [address] [phone]        Truitt Merle, MD. Schedule an appointment as soon as possible for a visit in 4 day(s).   Specialties:  Hematology, Oncology Contact information: [place] [address] [phone]            Contact information for after-discharge care    Destination    [facility] SN[facility] Follow up.   Specialty:  [place] information: [place] [phone]                 Allergies  Allergen Reactions  . Streptomycin Anaphylaxis    Consultations:  Urology  Medical Oncology  Interventional Radiology  Cardiology  ENT  Infectious Diseases  Subjective:  Seen and examined at bedside. Denied headache, dizziness, nausea,  vomiting, chest pain, shortness of breath. No abdominal pain. Diarrhea has improved. Tolerating diet well.  Discharge Exam: Vitals:   09/07/17 2026 09/08/17 0437  BP: (!) 102/58 (!) 96/56  Pulse: 90 86  Resp: 18 16  Temp: 98 F (36.7 C) 98.6 F (37 C)  SpO2: 97% 99%   Vitals:   09/07/17 1425 09/07/17 2026 09/08/17 0208 09/08/17 0437  BP: (!) 92/48 (!) 102/58  (!) 96/56  Pulse: 88 90  86  Resp: _0 Temp: 97.9 F (36.6 C) 98 F (36.7 C)  98.6 F (37 C)  TempSrc: Oral Oral  Oral  SpO2: 93% 97%  99%  Weight:   98.9 kg (218 lb 0.6 oz)   Height:        General: Pt is alert, awake, not in acute distress Cardiovascular: RRR, S1/S2 +, no rubs, no gallops Respiratory: CTA bilaterally, no wheezing, no rhonchi Abdominal: Soft, NT, ND, bowel sounds + Extremities: Bilateral lower extremities pitting edema Neurologic: Alert, awake, oriented 3. Nonfocal neuro exam.    The results of significant diagnostics from this hospitalization (including imaging, microbiology, ancillary and laboratory) are listed below for reference.     Microbiology: Recent Results (from the past 240 hour(s))  Culture, Urine     Status: None   Collection Time: 09/01/17  9:07 AM  Result Value Ref Range Status   Specimen Description URINE, RANDOM  Final   Special Requests NONE  Final   Culture NO GROWTH  Final   Report Status 09/02/2017 FINAL  Final  Culture, blood (routine x 2)     Status: None   Collection Time: 09/01/17  9:32 AM  Result Value Ref Range Status   Specimen Description BLOOD BLOOD LEFT ARM  Final   Special Requests   Final    IN PEDIATRIC BOTTLE Blood Culture results may not be optimal due to an excessive volume of blood received in culture bottles   Culture NO GROWTH 5 DAYS  Final   Report Status 09/06/2017 FINAL  Final  Culture, blood (routine x 2)     Status: None   Collection Time: 09/01/17  9:32 AM  Result Value Ref Range Status   Specimen Description BLOOD BLOOD LEFT ARM   Final   Special Requests   Final    IN PEDIATRIC BOTTLE Blood Culture results may not be optimal due to an excessive volume of blood received in culture bottles   Culture NO GROWTH 5 DAYS  Final   Report Status 09/06/2017 FINAL  Final  C difficile quick scan w PCR reflex     Status: None   Collection Time: 09/03/17  2:25 PM  Result Value Ref Range Status   C Diff antigen NEGATIVE NEGATIVE Final   C Diff toxin NEGATIVE NEGATIVE Final   C Diff interpretation No C. difficile detected.  Final     Labs: BNP (last 3 results)  Recent Labs  08/31/17 1430  BNP 97.2   Basic Metabolic Panel:  Recent Labs Lab 09/02/17 0520 09/03/17 0326 09/04/17 0823 09/05/17 0357 09/06/17 0436 09/07/17 0515 09/08/17 0500  NA 131* 132* 135 135 137 136 137  K 3.1* 4.0 3.7 4.3 5.2* 5.1 4.7  CL 98* 99* 101 102 105 102 104  CO2 _0 21* 22 23  GLUCOSE 111* 104* 157* 187* 114* 220* 163*  BUN 25* 25* 28* 32* 36* 45* 51*  CREATININE 1.56* 1.19* 0.96 1.05* 0.98 0.93 0.84  CALCIUM 6.1* 6.3* 6.8* 6.6* 7.0* 6.6* 6.7*  MG 1.5* 1.6*  --  1.8  --   --  1.8  PHOS 4.6 4.0  --   --   --   --  8.8*   Liver Function Tests:  Recent Labs Lab 09/03/17 0326 09/04/17 0823 09/05/17 0357 09/06/17 0436 09/07/17 0515 09/08/17 0500  AST 75* 34 33 40 36  --   ALT _1 37 36  --   ALKPHOS 125 138* 123 131* 119  --   BILITOT 3.0* 2.8* 2.0* 1.7* 1.2  --   PROT 3.2* 3.5* 3.3* 3.8* 3.3*  --   ALBUMIN 1.5* 1.7* 1.6* 1.7* 1.6* 1.7*  No results for input(s): LIPASE, AMYLASE in the last 168 hours. No results for input(s): AMMONIA in the last 168 hours. CBC:  Recent Labs Lab 09/03/17 0326  09/04/17 0823 09/05/17 0357 09/06/17 0436 09/07/17 0515 09/08/17 0500  WBC 27.9*  --  20.9* 8.0 8.1 5.7 5.0  NEUTROABS 18.5*  --  14.2* 6.7 6.6 4.5  --   HGB 8.6*  --  8.9* 8.7* 9.7* 8.4* 8.3*  HCT 24.6*  --  25.2* 25.3* 28.6* 24.8* 24.4*  MCV 94.3  --  94.7 96.9 99.0 99.2 98.0  PLT 49*  < > 62* 49* 61* 70* 71*   < > = values in this interval not displayed. Cardiac Enzymes: No results for input(s): CKTOTAL, CKMB, CKMBINDEX, TROPONINI in the last 168 hours. BNP: Invalid input(s): POCBNP CBG: No results for input(s): GLUCAP in the last 168 hours. D-Dimer No results for input(s): DDIMER in the last 72 hours. Hgb A1c No results for input(s): HGBA1C in the last 72 hours. Lipid Profile No results for input(s): CHOL, HDL, LDLCALC, TRIG, CHOLHDL, LDLDIRECT in the last 72 hours. Thyroid function studies No results for input(s): TSH, T4TOTAL, T3FREE, THYROIDAB in the last 72 hours.  Invalid input(s): FREET3 Anemia work up No results for input(s): VITAMINB12, FOLATE, FERRITIN, TIBC, IRON, RETICCTPCT in the last 72 hours. Urinalysis    Component Value Date/Time   COLORURINE YELLOW 09/01/2017 0908   APPEARANCEUR CLEAR 09/01/2017 0908   LABSPEC 1.010 09/01/2017 0908   PHURINE 5.0 09/01/2017 0908   GLUCOSEU NEGATIVE 09/01/2017 0908   HGBUR SMALL (A) 09/01/2017 0908   BILIRUBINUR NEGATIVE 09/01/2017 0908   KETONESUR NEGATIVE 09/01/2017 0908   PROTEINUR NEGATIVE 09/01/2017 0908   NITRITE NEGATIVE 09/01/2017 0908   LEUKOCYTESUR NEGATIVE 09/01/2017 0908   Sepsis Labs Invalid input(s): PROCALCITONIN,  WBC,  LACTICIDVEN Microbiology Recent Results (from the past 240 hour(s))  Culture, Urine     Status: None   Collection Time: 09/01/17  9:07 AM  Result Value Ref Range Status   Specimen Description URINE, RANDOM  Final   Special Requests NONE  Final   Culture NO GROWTH  Final   Report Status 09/02/2017 FINAL  Final  Culture, blood (routine x 2)     Status: None   Collection Time: 09/01/17  9:32 AM  Result Value Ref Range Status   Specimen Description BLOOD BLOOD LEFT ARM  Final   Special Requests   Final    IN PEDIATRIC BOTTLE Blood Culture results may not be optimal due to an excessive volume of blood received in culture bottles   Culture NO GROWTH 5 DAYS  Final   Report Status 09/06/2017  FINAL  Final  Culture, blood (routine x 2)     Status: None   Collection Time: 09/01/17  9:32 AM  Result Value Ref Range Status   Specimen Description BLOOD BLOOD LEFT ARM  Final   Special Requests   Final    IN PEDIATRIC BOTTLE Blood Culture results may not be optimal due to an excessive volume of blood received in culture bottles   Culture NO GROWTH 5 DAYS  Final   Report Status 09/06/2017 FINAL  Final  C difficile quick scan w PCR reflex     Status: None   Collection Time: 09/03/17  2:25 PM  Result Value Ref Range Status   C Diff antigen NEGATIVE NEGATIVE Final   C Diff toxin NEGATIVE NEGATIVE Final   C Diff interpretation No C. difficile detected.  Final  Time coordinating discharge: 34 minutes  SIGNED:   Rosita Fire, MD  [facility] 09/08/2017, 11:52 AM  If 7PM-7AM, please contact night-coverage [email or web address] Password [email or web address]

## 2017-09-10 ENCOUNTER — Inpatient Hospital Stay (HOSPITAL_COMMUNITY)
Admission: EM | Admit: 2017-09-10 | Discharge: 2017-10-03 | DRG: 291 | Disposition: A | Discharge status: Skilled Nursing Facility | Payer: Medicare Other | Attending: Internal Medicine | Admitting: Internal Medicine

## 2017-09-10 ENCOUNTER — Emergency Department (HOSPITAL_COMMUNITY): Payer: Medicare Other

## 2017-09-10 ENCOUNTER — Encounter (HOSPITAL_COMMUNITY): Payer: Self-pay | Admitting: Interventional Radiology

## 2017-09-10 DIAGNOSIS — I5033 Acute on chronic diastolic (congestive) heart failure: Secondary | ICD-10-CM | POA: Diagnosis present

## 2017-09-10 DIAGNOSIS — Z936 Other artificial openings of urinary tract status: Secondary | ICD-10-CM

## 2017-09-10 DIAGNOSIS — R578 Other shock: Secondary | ICD-10-CM | POA: Diagnosis not present

## 2017-09-10 DIAGNOSIS — E785 Hyperlipidemia, unspecified: Secondary | ICD-10-CM | POA: Diagnosis present

## 2017-09-10 DIAGNOSIS — D61810 Antineoplastic chemotherapy induced pancytopenia: Secondary | ICD-10-CM

## 2017-09-10 DIAGNOSIS — J9601 Acute respiratory failure with hypoxia: Secondary | ICD-10-CM

## 2017-09-10 DIAGNOSIS — Z888 Allergy status to other drugs, medicaments and biological substances status: Secondary | ICD-10-CM

## 2017-09-10 DIAGNOSIS — R0602 Shortness of breath: Secondary | ICD-10-CM

## 2017-09-10 DIAGNOSIS — E43 Unspecified severe protein-calorie malnutrition: Secondary | ICD-10-CM | POA: Diagnosis present

## 2017-09-10 DIAGNOSIS — Z6841 Body Mass Index (BMI) 40.0 and over, adult: Secondary | ICD-10-CM

## 2017-09-10 DIAGNOSIS — T83022A Displacement of nephrostomy catheter, initial encounter: Secondary | ICD-10-CM | POA: Diagnosis present

## 2017-09-10 DIAGNOSIS — I110 Hypertensive heart disease with heart failure: Secondary | ICD-10-CM | POA: Diagnosis not present

## 2017-09-10 DIAGNOSIS — I959 Hypotension, unspecified: Secondary | ICD-10-CM

## 2017-09-10 DIAGNOSIS — N131 Hydronephrosis with ureteral stricture, not elsewhere classified: Secondary | ICD-10-CM | POA: Diagnosis present

## 2017-09-10 DIAGNOSIS — C8330 Diffuse large B-cell lymphoma, unspecified site: Secondary | ICD-10-CM | POA: Diagnosis not present

## 2017-09-10 DIAGNOSIS — Y732 Prosthetic and other implants, materials and accessory gastroenterology and urology devices associated with adverse incidents: Secondary | ICD-10-CM | POA: Diagnosis present

## 2017-09-10 DIAGNOSIS — N139 Obstructive and reflux uropathy, unspecified: Secondary | ICD-10-CM

## 2017-09-10 DIAGNOSIS — R918 Other nonspecific abnormal finding of lung field: Secondary | ICD-10-CM | POA: Diagnosis not present

## 2017-09-10 DIAGNOSIS — Z9889 Other specified postprocedural states: Secondary | ICD-10-CM

## 2017-09-10 DIAGNOSIS — J90 Pleural effusion, not elsewhere classified: Secondary | ICD-10-CM | POA: Diagnosis present

## 2017-09-10 DIAGNOSIS — R0600 Dyspnea, unspecified: Secondary | ICD-10-CM

## 2017-09-10 DIAGNOSIS — Z9221 Personal history of antineoplastic chemotherapy: Secondary | ICD-10-CM

## 2017-09-10 DIAGNOSIS — R112 Nausea with vomiting, unspecified: Secondary | ICD-10-CM | POA: Diagnosis not present

## 2017-09-10 DIAGNOSIS — T451X5A Adverse effect of antineoplastic and immunosuppressive drugs, initial encounter: Secondary | ICD-10-CM

## 2017-09-10 DIAGNOSIS — E876 Hypokalemia: Secondary | ICD-10-CM | POA: Diagnosis not present

## 2017-09-10 DIAGNOSIS — M069 Rheumatoid arthritis, unspecified: Secondary | ICD-10-CM | POA: Diagnosis present

## 2017-09-10 DIAGNOSIS — K5900 Constipation, unspecified: Secondary | ICD-10-CM | POA: Diagnosis not present

## 2017-09-10 DIAGNOSIS — C8338 Diffuse large B-cell lymphoma, lymph nodes of multiple sites: Secondary | ICD-10-CM

## 2017-09-10 DIAGNOSIS — I2510 Atherosclerotic heart disease of native coronary artery without angina pectoris: Secondary | ICD-10-CM | POA: Diagnosis present

## 2017-09-10 DIAGNOSIS — D649 Anemia, unspecified: Secondary | ICD-10-CM | POA: Diagnosis not present

## 2017-09-10 DIAGNOSIS — Z79899 Other long term (current) drug therapy: Secondary | ICD-10-CM

## 2017-09-10 DIAGNOSIS — Z87891 Personal history of nicotine dependence: Secondary | ICD-10-CM

## 2017-09-10 DIAGNOSIS — D630 Anemia in neoplastic disease: Secondary | ICD-10-CM | POA: Diagnosis present

## 2017-09-10 LAB — I-STAT TROPONIN, ED: TROPONIN I, POC: 0.01 ng/mL (ref 0.00–0.08)

## 2017-09-10 LAB — I-STAT CG4 LACTIC ACID, ED: LACTIC ACID, VENOUS: 2.45 mmol/L — AB (ref 0.5–1.9)

## 2017-09-10 MED ORDER — SODIUM CHLORIDE 0.9 % IV BOLUS (SEPSIS)
1000.0000 mL | Freq: Once | INTRAVENOUS | Status: AC
  Administered 2017-09-10: 1000 mL via INTRAVENOUS

## 2017-09-10 NOTE — ED Notes (Signed)
Abnormal lab result MD Knapp have been made aware 

## 2017-09-10 NOTE — ED Triage Notes (Signed)
Pt presents from [facility] for evaluation nephrostomy tube that had been removed and blood in foley catheter. EMS reports pt was hypotensive on arrival of 70/30. Pt alert and oriented x 4 at this time.

## 2017-09-11 ENCOUNTER — Inpatient Hospital Stay (HOSPITAL_COMMUNITY): Payer: Medicare Other

## 2017-09-11 ENCOUNTER — Encounter: Payer: Self-pay | Admitting: Internal Medicine

## 2017-09-11 ENCOUNTER — Encounter (HOSPITAL_COMMUNITY): Payer: Self-pay

## 2017-09-11 ENCOUNTER — Encounter (HOSPITAL_COMMUNITY): Payer: Self-pay | Admitting: Interventional Radiology

## 2017-09-11 DIAGNOSIS — J80 Acute respiratory distress syndrome: Secondary | ICD-10-CM | POA: Diagnosis not present

## 2017-09-11 DIAGNOSIS — Z9889 Other specified postprocedural states: Secondary | ICD-10-CM | POA: Diagnosis not present

## 2017-09-11 DIAGNOSIS — J9601 Acute respiratory failure with hypoxia: Secondary | ICD-10-CM | POA: Diagnosis not present

## 2017-09-11 DIAGNOSIS — M069 Rheumatoid arthritis, unspecified: Secondary | ICD-10-CM | POA: Diagnosis not present

## 2017-09-11 DIAGNOSIS — N131 Hydronephrosis with ureteral stricture, not elsewhere classified: Secondary | ICD-10-CM | POA: Diagnosis present

## 2017-09-11 DIAGNOSIS — R5383 Other fatigue: Secondary | ICD-10-CM | POA: Diagnosis not present

## 2017-09-11 DIAGNOSIS — I959 Hypotension, unspecified: Secondary | ICD-10-CM | POA: Diagnosis not present

## 2017-09-11 DIAGNOSIS — A419 Sepsis, unspecified organism: Secondary | ICD-10-CM | POA: Diagnosis not present

## 2017-09-11 DIAGNOSIS — R578 Other shock: Secondary | ICD-10-CM | POA: Diagnosis not present

## 2017-09-11 DIAGNOSIS — Z79899 Other long term (current) drug therapy: Secondary | ICD-10-CM | POA: Diagnosis not present

## 2017-09-11 DIAGNOSIS — N390 Urinary tract infection, site not specified: Secondary | ICD-10-CM | POA: Diagnosis not present

## 2017-09-11 DIAGNOSIS — M6281 Muscle weakness (generalized): Secondary | ICD-10-CM | POA: Diagnosis not present

## 2017-09-11 DIAGNOSIS — I110 Hypertensive heart disease with heart failure: Secondary | ICD-10-CM | POA: Diagnosis present

## 2017-09-11 DIAGNOSIS — F4322 Adjustment disorder with anxiety: Secondary | ICD-10-CM | POA: Diagnosis not present

## 2017-09-11 DIAGNOSIS — C8338 Diffuse large B-cell lymphoma, lymph nodes of multiple sites: Secondary | ICD-10-CM | POA: Diagnosis not present

## 2017-09-11 DIAGNOSIS — M109 Gout, unspecified: Secondary | ICD-10-CM | POA: Diagnosis not present

## 2017-09-11 DIAGNOSIS — E785 Hyperlipidemia, unspecified: Secondary | ICD-10-CM | POA: Diagnosis present

## 2017-09-11 DIAGNOSIS — R591 Generalized enlarged lymph nodes: Secondary | ICD-10-CM | POA: Diagnosis not present

## 2017-09-11 DIAGNOSIS — R601 Generalized edema: Secondary | ICD-10-CM | POA: Diagnosis not present

## 2017-09-11 DIAGNOSIS — Y732 Prosthetic and other implants, materials and accessory gastroenterology and urology devices associated with adverse incidents: Secondary | ICD-10-CM | POA: Diagnosis present

## 2017-09-11 DIAGNOSIS — R531 Weakness: Secondary | ICD-10-CM | POA: Diagnosis not present

## 2017-09-11 DIAGNOSIS — R836 Abnormal cytological findings in cerebrospinal fluid: Secondary | ICD-10-CM | POA: Diagnosis not present

## 2017-09-11 DIAGNOSIS — C8330 Diffuse large B-cell lymphoma, unspecified site: Secondary | ICD-10-CM | POA: Diagnosis present

## 2017-09-11 DIAGNOSIS — T451X5A Adverse effect of antineoplastic and immunosuppressive drugs, initial encounter: Secondary | ICD-10-CM | POA: Diagnosis not present

## 2017-09-11 DIAGNOSIS — R262 Difficulty in walking, not elsewhere classified: Secondary | ICD-10-CM | POA: Diagnosis not present

## 2017-09-11 DIAGNOSIS — E46 Unspecified protein-calorie malnutrition: Secondary | ICD-10-CM | POA: Diagnosis not present

## 2017-09-11 DIAGNOSIS — C8590 Non-Hodgkin lymphoma, unspecified, unspecified site: Secondary | ICD-10-CM | POA: Diagnosis not present

## 2017-09-11 DIAGNOSIS — R0600 Dyspnea, unspecified: Secondary | ICD-10-CM | POA: Diagnosis not present

## 2017-09-11 DIAGNOSIS — E876 Hypokalemia: Secondary | ICD-10-CM | POA: Diagnosis not present

## 2017-09-11 DIAGNOSIS — Z6841 Body Mass Index (BMI) 40.0 and over, adult: Secondary | ICD-10-CM | POA: Diagnosis not present

## 2017-09-11 DIAGNOSIS — R846 Abnormal cytological findings in specimens from respiratory organs and thorax: Secondary | ICD-10-CM | POA: Diagnosis not present

## 2017-09-11 DIAGNOSIS — R6521 Severe sepsis with septic shock: Secondary | ICD-10-CM | POA: Diagnosis not present

## 2017-09-11 DIAGNOSIS — D61810 Antineoplastic chemotherapy induced pancytopenia: Secondary | ICD-10-CM | POA: Diagnosis not present

## 2017-09-11 DIAGNOSIS — I5033 Acute on chronic diastolic (congestive) heart failure: Secondary | ICD-10-CM | POA: Diagnosis present

## 2017-09-11 DIAGNOSIS — N139 Obstructive and reflux uropathy, unspecified: Secondary | ICD-10-CM | POA: Diagnosis not present

## 2017-09-11 DIAGNOSIS — Z658 Other specified problems related to psychosocial circumstances: Secondary | ICD-10-CM | POA: Diagnosis not present

## 2017-09-11 DIAGNOSIS — Z936 Other artificial openings of urinary tract status: Secondary | ICD-10-CM | POA: Diagnosis not present

## 2017-09-11 DIAGNOSIS — N179 Acute kidney failure, unspecified: Secondary | ICD-10-CM | POA: Diagnosis not present

## 2017-09-11 DIAGNOSIS — K219 Gastro-esophageal reflux disease without esophagitis: Secondary | ICD-10-CM | POA: Diagnosis not present

## 2017-09-11 DIAGNOSIS — R6520 Severe sepsis without septic shock: Secondary | ICD-10-CM | POA: Diagnosis not present

## 2017-09-11 DIAGNOSIS — T83022D Displacement of nephrostomy catheter, subsequent encounter: Secondary | ICD-10-CM | POA: Diagnosis not present

## 2017-09-11 DIAGNOSIS — R0602 Shortness of breath: Secondary | ICD-10-CM | POA: Diagnosis not present

## 2017-09-11 DIAGNOSIS — D696 Thrombocytopenia, unspecified: Secondary | ICD-10-CM | POA: Diagnosis not present

## 2017-09-11 DIAGNOSIS — I2510 Atherosclerotic heart disease of native coronary artery without angina pectoris: Secondary | ICD-10-CM | POA: Diagnosis present

## 2017-09-11 DIAGNOSIS — E43 Unspecified severe protein-calorie malnutrition: Secondary | ICD-10-CM | POA: Diagnosis present

## 2017-09-11 DIAGNOSIS — K5900 Constipation, unspecified: Secondary | ICD-10-CM | POA: Diagnosis not present

## 2017-09-11 DIAGNOSIS — I10 Essential (primary) hypertension: Secondary | ICD-10-CM | POA: Diagnosis not present

## 2017-09-11 DIAGNOSIS — Z741 Need for assistance with personal care: Secondary | ICD-10-CM | POA: Diagnosis not present

## 2017-09-11 DIAGNOSIS — R112 Nausea with vomiting, unspecified: Secondary | ICD-10-CM | POA: Diagnosis not present

## 2017-09-11 DIAGNOSIS — R110 Nausea: Secondary | ICD-10-CM | POA: Diagnosis not present

## 2017-09-11 DIAGNOSIS — R590 Localized enlarged lymph nodes: Secondary | ICD-10-CM | POA: Diagnosis not present

## 2017-09-11 DIAGNOSIS — D630 Anemia in neoplastic disease: Secondary | ICD-10-CM | POA: Diagnosis present

## 2017-09-11 DIAGNOSIS — D649 Anemia, unspecified: Secondary | ICD-10-CM | POA: Diagnosis not present

## 2017-09-11 DIAGNOSIS — N1330 Unspecified hydronephrosis: Secondary | ICD-10-CM | POA: Diagnosis not present

## 2017-09-11 DIAGNOSIS — R799 Abnormal finding of blood chemistry, unspecified: Secondary | ICD-10-CM | POA: Diagnosis not present

## 2017-09-11 DIAGNOSIS — J90 Pleural effusion, not elsewhere classified: Secondary | ICD-10-CM | POA: Diagnosis not present

## 2017-09-11 DIAGNOSIS — T83022A Displacement of nephrostomy catheter, initial encounter: Secondary | ICD-10-CM | POA: Diagnosis not present

## 2017-09-11 HISTORY — PX: IR NEPHROSTOMY EXCHANGE RIGHT: IMG6070

## 2017-09-11 LAB — URINALYSIS, ROUTINE W REFLEX MICROSCOPIC
Bilirubin Urine: NEGATIVE
GLUCOSE, UA: NEGATIVE mg/dL
Ketones, ur: NEGATIVE mg/dL
NITRITE: NEGATIVE
PH: 5 (ref 5.0–8.0)
Protein, ur: NEGATIVE mg/dL
SPECIFIC GRAVITY, URINE: 1.018 (ref 1.005–1.030)
Squamous Epithelial / LPF: NONE SEEN
WBC, UA: NONE SEEN WBC/hpf (ref 0–5)

## 2017-09-11 LAB — CBC WITH DIFFERENTIAL/PLATELET
Basophils Absolute: 0 10*3/uL (ref 0.0–0.1)
Basophils Relative: 0 %
EOS ABS: 0 10*3/uL (ref 0.0–0.7)
EOS PCT: 1 %
HCT: 19.7 % — ABNORMAL LOW (ref 36.0–46.0)
Hemoglobin: 6.8 g/dL — CL (ref 12.0–15.0)
LYMPHS ABS: 0.2 10*3/uL — AB (ref 0.7–4.0)
LYMPHS PCT: 11 %
MCH: 33.8 pg (ref 26.0–34.0)
MCHC: 34.5 g/dL (ref 30.0–36.0)
MCV: 98 fL (ref 78.0–100.0)
MONO ABS: 0 10*3/uL — AB (ref 0.1–1.0)
MONOS PCT: 1 %
Neutro Abs: 1.6 10*3/uL — ABNORMAL LOW (ref 1.7–7.7)
Neutrophils Relative %: 88 %
PLATELETS: 64 10*3/uL — AB (ref 150–400)
RBC: 2.01 MIL/uL — ABNORMAL LOW (ref 3.87–5.11)
RDW: 20.5 % — ABNORMAL HIGH (ref 11.5–15.5)
WBC: 1.9 10*3/uL — ABNORMAL LOW (ref 4.0–10.5)

## 2017-09-11 LAB — COMPREHENSIVE METABOLIC PANEL
ALK PHOS: 66 U/L (ref 38–126)
ALT: 25 U/L (ref 14–54)
AST: 25 U/L (ref 15–41)
Albumin: 1.4 g/dL — ABNORMAL LOW (ref 3.5–5.0)
Anion gap: 5 (ref 5–15)
BUN: 42 mg/dL — ABNORMAL HIGH (ref 6–20)
CALCIUM: 8.4 mg/dL — AB (ref 8.9–10.3)
CO2: 25 mmol/L (ref 22–32)
CREATININE: 0.77 mg/dL (ref 0.44–1.00)
Chloride: 102 mmol/L (ref 101–111)
Glucose, Bld: 138 mg/dL — ABNORMAL HIGH (ref 65–99)
Potassium: 4.7 mmol/L (ref 3.5–5.1)
Sodium: 132 mmol/L — ABNORMAL LOW (ref 135–145)
Total Bilirubin: 1.2 mg/dL (ref 0.3–1.2)
Total Protein: <3 g/dL — ABNORMAL LOW (ref 6.5–8.1)

## 2017-09-11 LAB — BRAIN NATRIURETIC PEPTIDE: B NATRIURETIC PEPTIDE 5: 34.5 pg/mL (ref 0.0–100.0)

## 2017-09-11 LAB — PROCALCITONIN: Procalcitonin: 2.74 ng/mL

## 2017-09-11 LAB — I-STAT CG4 LACTIC ACID, ED: LACTIC ACID, VENOUS: 1.15 mmol/L (ref 0.5–1.9)

## 2017-09-11 LAB — MRSA PCR SCREENING: MRSA BY PCR: NEGATIVE

## 2017-09-11 LAB — PROTIME-INR
INR: 1.17
PROTHROMBIN TIME: 14.8 s (ref 11.4–15.2)

## 2017-09-11 LAB — APTT: APTT: 30 s (ref 24–36)

## 2017-09-11 LAB — CORTISOL: Cortisol, Plasma: 15.3 ug/dL

## 2017-09-11 LAB — PREPARE RBC (CROSSMATCH)

## 2017-09-11 MED ORDER — IOPAMIDOL (ISOVUE-300) INJECTION 61%
INTRAVENOUS | Status: AC
  Administered 2017-09-11: 15 mL
  Filled 2017-09-11: qty 50

## 2017-09-11 MED ORDER — DEXTROSE 5 % IV SOLN
0.0000 ug/min | INTRAVENOUS | Status: DC
  Administered 2017-09-11: 7 ug/min via INTRAVENOUS
  Administered 2017-09-11: 11 ug/min via INTRAVENOUS
  Filled 2017-09-11 (×3): qty 4

## 2017-09-11 MED ORDER — SODIUM CHLORIDE 0.9 % IV SOLN
250.0000 mL | INTRAVENOUS | Status: DC | PRN
Start: 2017-09-11 — End: 2017-09-15

## 2017-09-11 MED ORDER — ASPIRIN 300 MG RE SUPP: 300.0000 mg | RECTAL | Status: AC

## 2017-09-11 MED ORDER — NOREPINEPHRINE BITARTRATE 1 MG/ML IV SOLN: 0.0000 ug/min | Freq: Once | INTRAVENOUS | Status: DC

## 2017-09-11 MED ORDER — HYDROCORTISONE NA SUCCINATE PF 100 MG IJ SOLR
50.0000 mg | Freq: Four times a day (QID) | INTRAMUSCULAR | Status: DC
Start: 2017-09-11 — End: 2017-09-15
  Administered 2017-09-11 – 2017-09-15 (×15): 50 mg via INTRAVENOUS
  Filled 2017-09-11 (×15): qty 2

## 2017-09-11 MED ORDER — LIDOCAINE-EPINEPHRINE (PF) 2 %-1:200000 IJ SOLN
INTRAMUSCULAR | Status: AC
  Filled 2017-09-11: qty 20

## 2017-09-11 MED ORDER — NOREPINEPHRINE BITARTRATE 1 MG/ML IV SOLN
0.0000 ug/min | Freq: Once | INTRAVENOUS | Status: AC
  Administered 2017-09-11: 2 ug/min via INTRAVENOUS
  Filled 2017-09-11: qty 4

## 2017-09-11 MED ORDER — CHLORHEXIDINE GLUCONATE CLOTH 2 % EX PADS
6.0000 | MEDICATED_PAD | Freq: Every morning | CUTANEOUS | Status: DC
  Administered 2017-09-12 – 2017-09-29 (×11): 6 via TOPICAL

## 2017-09-11 MED ORDER — VANCOMYCIN HCL 10 G IV SOLR
1250.0000 mg | Freq: Two times a day (BID) | INTRAVENOUS | Status: DC
  Administered 2017-09-12 – 2017-09-13 (×4): 1250 mg via INTRAVENOUS
  Filled 2017-09-11 (×4): qty 1250

## 2017-09-11 MED ORDER — PIPERACILLIN-TAZOBACTAM 3.375 G IVPB 30 MIN
3.3750 g | Freq: Once | INTRAVENOUS | Status: AC
  Administered 2017-09-11: 3.375 g via INTRAVENOUS

## 2017-09-11 MED ORDER — ASPIRIN 81 MG PO CHEW
324.0000 mg | CHEWABLE_TABLET | ORAL | Status: AC
  Filled 2017-09-11 (×2): qty 4

## 2017-09-11 MED ORDER — PANTOPRAZOLE SODIUM 40 MG PO TBEC
40.0000 mg | DELAYED_RELEASE_TABLET | Freq: Every day | ORAL | Status: DC
Start: 2017-09-11 — End: 2017-10-03
  Administered 2017-09-12 – 2017-10-02 (×7): 40 mg via ORAL
  Filled 2017-09-11 (×21): qty 1

## 2017-09-11 MED ORDER — SODIUM CHLORIDE 0.9 % IV SOLN
INTRAVENOUS | Status: DC
  Administered 2017-09-11 – 2017-09-14 (×7): via INTRAVENOUS

## 2017-09-11 MED ORDER — PHENOL 1.4 % MT LIQD
1.0000 | OROMUCOSAL | Status: DC | PRN
  Filled 2017-09-11: qty 177

## 2017-09-11 MED ORDER — VANCOMYCIN HCL IN DEXTROSE 1-5 GM/200ML-% IV SOLN
1000.0000 mg | Freq: Once | INTRAVENOUS | Status: AC
  Administered 2017-09-11: 1000 mg via INTRAVENOUS
  Filled 2017-09-11: qty 200

## 2017-09-11 MED ORDER — FENTANYL CITRATE (PF) 100 MCG/2ML IJ SOLN
INTRAMUSCULAR | Status: AC
  Filled 2017-09-11: qty 2

## 2017-09-11 MED ORDER — FENTANYL CITRATE (PF) 100 MCG/2ML IJ SOLN
INTRAMUSCULAR | Status: AC | PRN
  Administered 2017-09-11: 50 ug via INTRAVENOUS

## 2017-09-11 MED ORDER — ONDANSETRON 4 MG PO TBDP: 4.0000 mg | ORAL_TABLET | ORAL | Status: DC | PRN

## 2017-09-11 MED ORDER — MIDAZOLAM HCL 2 MG/2ML IJ SOLN
INTRAMUSCULAR | Status: AC
  Filled 2017-09-11: qty 2

## 2017-09-11 MED ORDER — PIPERACILLIN-TAZOBACTAM 3.375 G IVPB
3.3750 g | Freq: Three times a day (TID) | INTRAVENOUS | Status: DC
  Administered 2017-09-11 – 2017-09-15 (×12): 3.375 g via INTRAVENOUS
  Filled 2017-09-11 (×14): qty 50

## 2017-09-11 MED ORDER — SODIUM CHLORIDE 0.9 % IV BOLUS (SEPSIS)
1000.0000 mL | Freq: Once | INTRAVENOUS | Status: AC
  Administered 2017-09-11: 1000 mL via INTRAVENOUS

## 2017-09-11 MED ORDER — PIPERACILLIN-TAZOBACTAM 3.375 G IVPB
INTRAVENOUS | Status: AC
  Administered 2017-09-11: 3.375 g via INTRAVENOUS
  Filled 2017-09-11: qty 50

## 2017-09-11 NOTE — ED Notes (Signed)
Per night shift RN, Jenny Reichmann. Attempted to irrigate foley; unsuccessful. MDs aware.

## 2017-09-11 NOTE — ED Notes (Addendum)
Blood ready in lab RN have been made aware

## 2017-09-11 NOTE — Progress Notes (Signed)
Pharmacy Antibiotic Note  Kristina Torres is a 71 y.o. 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  She was recently discharged on 9/22 but presented back to the ED on 9/24 for hypotension and dislodgement of nephrostomy tube.  To start broad abx with vancomycin and zosyn for suspected sepsis.   Plan: -  Zosyn 3.375 gm IV x1 over 30 min, then 3.375 gm IV q8h (infuse over 4 hours) - Vancomycin 1000 mg IV x 1, then 1250 mg IV q12h - daily scr ____________________________________  Height: 5' (152.4 cm) Weight: 216 lb (98 kg) IBW/kg (Calculated) : 45.5  Temp (24hrs), Avg:98.1 F (36.7 C), Min:97.5 F (36.4 C), Max:98.8 F (37.1 C)   Recent Labs Lab 09/05/17 0357 09/06/17 0436 09/07/17 0515 09/08/17 0500 09/10/17 2319 09/10/17 2327 09/11/17 0234  WBC 8.0 8.1 5.7 5.0 1.9*  --   --   CREATININE 1.05* 0.98 0.93 0.84 0.77  --   --   LATICACIDVEN  --   --   --   --   --  2.45* 1.15    Estimated Creatinine Clearance: 67.7 mL/min (by C-G formula based on SCr of 0.77 mg/dL).    Allergies  Allergen Reactions  . Streptomycin Anaphylaxis     Thank you for allowing pharmacy to be a part of this patient's care.  Lynelle Doctor 09/11/2017 9:32 AM

## 2017-09-11 NOTE — H&P (Signed)
PULMONARY / CRITICAL CARE MEDICINE   Name: Kristina Torres MRN: [id number] DOB: Jan 26, 1946    ADMISSION DATE:  09/10/2017 CONSULTATION DATE:  09/10/2017  REFERRING MD:  Dr. Tomi Bamberger  CHIEF COMPLAINT:  Right nephrostomy tube displacement  HISTORY OF PRESENT ILLNESS:   71 year old female with PMH of HTN, RA, HLD, diffuse large B-cell lymphoma (after retroperitoneal lymph node biopsy 9/6 and 08/24/17, bone marrow biopsy 08/31/17) with hydronephrosis of the right kidney s/p nephrostomy tube 08/21/17 with exchange 08/31/17 after tube malfunction.  She was started on chemo with cytoxan and vincristine 09/06/17 under the care of Dr. Burr Medico with oncology and was later discharged to rehab 09/08/17.  Presents to ED 9/25 after dislodgment of nephrostomy tube while moving. Upon arrival to ED BP 63/28. Patient states that baseline BP has been around 80/30 for last 2 weeks. Noted to have significant peripheral edema for the last several days. LA 2.45, WBC 1.9, PLTs 64.  She was started on norepinephrine in ED and PCCM was called for admission.   PAST MEDICAL HISTORY :  She  has a past medical history of Essential hypertension; Gout; HLD (hyperlipidemia); and Rheumatoid arthritis ([place]).  PAST SURGICAL HISTORY: She  has a past surgical history that includes Appendectomy; Laparoscopic salpingo oophorectomy (Right); Carpal tunnel release (Right); IR NEPHROSTOMY PLACEMENT RIGHT (08/21/2017); IR NEPHROSTOMY EXCHANGE RIGHT (08/31/2017); IR FLUORO GUIDE PORT INSERTION RIGHT (09/06/2017); and IR US Guide Vasc Access Right (09/06/2017).  Allergies  Allergen Reactions  . Streptomycin Anaphylaxis    No current facility-administered medications on file prior to encounter.    Current Outpatient Prescriptions on File Prior to Encounter  Medication Sig  . acetaminophen (TYLENOL) 325 MG tablet Take 2 tablets (650 mg total) by mouth every 6 (six) hours as needed for mild pain (or Fever >/= 101).  Marland Kitchen allopurinol (ZYLOPRIM) 300 MG  tablet Take 300 mg by mouth daily.  Marland Kitchen dextromethorphan-guaiFENesin (MUCINEX DM) 30-600 MG 12hr tablet Take 1 tablet by mouth 2 (two) times daily as needed for cough.  . diphenoxylate-atropine (LOMOTIL) 2.5-0.025 MG tablet Take 1 tablet by mouth 4 (four) times daily as needed for diarrhea or loose stools.  . furosemide (LASIX) 20 MG tablet Take 1 tablet (20 mg total) by mouth daily.  . ondansetron (ZOFRAN-ODT) 4 MG disintegrating tablet Take 4 mg by mouth as needed.  . pantoprazole (PROTONIX) 40 MG tablet Take 1 tablet (40 mg total) by mouth daily.  . simvastatin (ZOCOR) 20 MG tablet Take 20 mg by mouth daily.    FAMILY HISTORY:  Her indicated that the status of her mother is unknown.    SOCIAL HISTORY: She  reports that she quit smoking about 5 weeks ago. She has never used smokeless tobacco. She reports that she does not drink alcohol or use drugs.  REVIEW OF SYSTEMS:   Review of Systems:   Bolds are positive  Constitutional: weight loss, gain, night sweats, Fevers, chills, fatigue .  HEENT: headaches, Sore throat, sneezing, nasal congestion, post nasal drip, Difficulty swallowing, Tooth/dental problems, visual complaints visual changes, ear ache CV:  No chest pain,,Orthopnea, PND, swelling in lower extremities, dizziness, palpitations, syncope.  GI  heartburn, indigestion, abdominal pain, nausea, vomiting, diarrhea, change in bowel habits, loss of appetite, bloody stools.  Resp: cough, productive: no cough  , hemoptysis, dyspnea none, chest pain, pleuritic.  Skin: rash or itching or icterus GU: dysuria, change in color of urine, urgency or frequency. flank pain, hematuria  MS: joint pain or swelling. decreased range of motion  Psych: change in mood or affect. depression or anxiety.  Neuro: difficulty with speech, weakness, numbness, ataxia    SUBJECTIVE:  Feels ok   VITAL SIGNS: BP (!) 73/42   Pulse 69   Temp (!) 97.5 F (36.4 C) (Oral)   Resp 18   Ht 5' (1.524 m)   Wt 98  kg (216 lb)   SpO2 99%   BMI 42.18 kg/m   HEMODYNAMICS:    VENTILATOR SETTINGS:    INTAKE / OUTPUT: No intake/output data recorded.  PHYSICAL EXAMINATION: General appearance:  Chronically ill appearing 71 Year old  female, has diffuse anasarca but is in NAD, conversant  Eyes: anicteric sclerae, moist conjunctivae; PERRL, EOMI bilaterally. Mouth: moist membranes and no mucosal ulcerations;  Neck: Trachea midline; neck supple, no JVD Lungs/chest: CTA, with normal respiratory effort and no intercostal retractions CV: RRR, no MRGs  Abdomen: Soft, non-tender; no masses  Extremities: she has diffuse anasarca 4+ AND scattered areas of ecchymosis Skin: Normal temperature, Psych: Appropriate affect, alert and oriented to person, place and time   LABS:  BMET  Recent Labs Lab 09/07/17 0515 09/08/17 0500 09/10/17 2319  NA 136 137 132*  K 5.1 4.7 4.7  CL 102 104 102  CO2 _0 BUN 45* 51* 42*  CREATININE 0.93 0.84 0.77  GLUCOSE 220* 163* 138*    Electrolytes  Recent Labs Lab 09/05/17 0357  09/07/17 0515 09/08/17 0500 09/10/17 2319  CALCIUM 6.6*  < > 6.6* 6.7* 8.4*  MG 1.8  --   --  1.8  --   PHOS  --   --   --  8.8*  --   < > = values in this interval not displayed.  CBC  Recent Labs Lab 09/07/17 0515 09/08/17 0500 09/10/17 2319  WBC 5.7 5.0 1.9*  HGB 8.4* 8.3* 6.8*  HCT 24.8* 24.4* 19.7*  PLT 70* 71* 64*    Coag's No results for input(s): APTT, INR in the last 168 hours.  Sepsis Markers  Recent Labs Lab 09/10/17 2327 09/11/17 0234  LATICACIDVEN 2.45* 1.15    ABG No results for input(s): PHART, PCO2ART, PO2ART in the last 168 hours.  Liver Enzymes  Recent Labs Lab 09/06/17 0436 09/07/17 0515 09/08/17 0500 09/10/17 2319  AST 40 36  --  25  ALT 37 36  --  25  ALKPHOS 131* 119  --  66  BILITOT 1.7* 1.2  --  1.2  ALBUMIN 1.7* 1.6* 1.7* 1.4*    Cardiac Enzymes No results for input(s): TROPONINI, PROBNP in the last 168  hours.  Glucose No results for input(s): GLUCAP in the last 168 hours.  Imaging Dg Chest Port 1 View  Result Date: 09/10/2017 CLINICAL DATA:  Hypotension EXAM: PORTABLE CHEST 1 VIEW COMPARISON:  09/01/2017, CT chest 09/02/2017 FINDINGS: Interval placement of a right-sidedcentral venous port with tip overlying the mid SVC. Hazy bibasilar atelectasis or infiltrate at the bases. Stable cardiomediastinal silhouette. No pneumothorax. IMPRESSION: No significant interval change in left greater than right hazy bibasilar atelectasis or infiltrates. Electronically Signed   By: Donavan Foil M.D.   On: 09/10/2017 23:32     STUDIES:  CXR 9/24 > Interval placement of a right-sidedcentral venous port with tip overlying the mid SVC. Hazy bibasilar atelectasis or infiltrate at the bases. Stable cardiomediastinal silhouette. No pneumothorax.  CULTURES: Blood 9/25 >  Urine 9/25 >   ANTIBIOTICS: Vanc 9/25 >  Zosyn 9/25 >   SIGNIFICANT EVENTS: 9/25 > Presents to ED,  admitted  LINES/TUBES: PIV   DISCUSSION: 71 y.o. F with recent diagnosis of diffuse large B cell lymphoma and recent initiation of chemo, presented to ED 9/24 after nephrostomy tube dislodgement.  While in ED, found to have hypotension (though per pt she has been on hypotensive side for at least 2 weeks) and was subsequently started on norepinephrine after BP failed to improve with IVF's.  ASSESSMENT / PLAN:    Hypotension - pt has been on hypotensive side for past several weeks.  Lactate clearance reassuring H/O HTN, HLD  ->not convinced she is septic Plan Assess cortisol Start stress steroids empirically, hold if cortisol > 20 Hold preadmission furosemide, simvastatin MAP goal > 60, SBP goal >90  Severe right hydronephrosis and hydroureter s/p nephrostomy tube placement 9/4 with exchange 9/14, now with tube dislodgement 9/24 Mild hyponatremia Anasacra Plan Consult IR for tube replacement NS @ 100 Trend BMP   Sever  protein calorie malnutrition (cancer related) N/V Hx GERD Plan PPI Adv diet as tolerated Needs nutritional consult    Pancytopenia w/ last Chemo (cytoxana and Vincristine administered on 9/20) Diffuse large B cell lymphoma  Plan  Maintain Hbg >7 (got one unit in ED for hgb of 6.8) Monitor platelet counts Trend CBC  Notify oncology of admission (pt follows with Dr. Burr Medico) Place SCDs  Rule out Adrenal insufficiency  -has had prolonged illness, prior steroids exposure as well.  Plan Assess cortisol and start empiric stress steroids for now   Hx RA - previously reportedly on methotrexate (remotely) Plan  Monitor   FAMILY  - Updates:   - Inter-disciplinary family meet or Palliative Care meeting 10/2  My cct 73 min  Erick Colace ACNP-BC [place] Pager # [phone] OR # [phone] if no answer

## 2017-09-11 NOTE — ED Provider Notes (Signed)
[facility] [place] [facility] DEPT Provider Note   CSN: [id number] Arrival date & time: 09/10/17  2245  Time seen 22:59 PM   History   Chief Complaint Chief Complaint  Patient presents with  . Hypotension    HPI Kristina Torres is a 71 y.o. female.  HPI  Patient has a history of hypertension, rheumatoid arthritis, and diffuse large B-cell lymphoma with hydronephrosis of the right kidney and nephrostomy tube. She reports she was discharged from the hospital 2 days ago to a nursing facility. She states tonight when they were moving her the right nephrostomy tube was dislodged. She states that is the reason for her ED visit tonight. She states that she has been having a low blood pressure since September 3 and states her blood pressure has been typically 80/30. She states before that she used to have hypertension. She is noted to have significant peripheral edema which she states is not worse than several days ago. She states she is not on dialysis. She denies feeling short of breath, feeling dizzy or lightheaded although she states if she sits up she will feel lightheaded. She denies any chest pain. She also feels like her Foley catheter is not draining well. She is requesting that it be flushed. She also states when she was discharged from the hospital her hemoglobin was 8.  PCP [name], MD Oncology [facility] Dr Karsten Ro  Past Medical History:  Diagnosis Date  . Essential hypertension   . Gout   . HLD (hyperlipidemia)   . Rheumatoid arthritis [facility]     Patient Active Problem List   Diagnosis Date Noted  . DLBCL (diffuse large B cell lymphoma) ([place]) 09/03/2017  . Cytopenia   . Hypophosphatemia 08/29/2017  . Hypomagnesemia 08/29/2017  . Hyponatremia 08/29/2017  . Acute urinary retention 08/29/2017  . Hyperbilirubinemia 08/29/2017  . Macrocytic anemia 08/21/2017  . Lymphadenopathy   . UTI (urinary tract infection) 08/20/2017  . Sepsis ([place]) 08/20/2017  . Hypotension  08/20/2017  . AKI (acute kidney injury) ([place]) 08/20/2017  . Nausea & vomiting 08/20/2017  . Hypercalcemia 08/20/2017  . Thrombocytopenia ([place]) 08/20/2017  . Cough 08/20/2017  . Rheumatoid arthritis ([place])   . Essential hypertension   . Gout   . HLD (hyperlipidemia)     Past Surgical History:  Procedure Laterality Date  . APPENDECTOMY    . CARPAL TUNNEL RELEASE Right   . IR FLUORO GUIDE PORT INSERTION RIGHT  09/06/2017  . IR NEPHROSTOMY EXCHANGE RIGHT  08/31/2017  . IR NEPHROSTOMY PLACEMENT RIGHT  08/21/2017  . IR US GUIDE VASC ACCESS RIGHT  09/06/2017  . LAPAROSCOPIC SALPINGO OOPHERECTOMY Right     OB History    No data available       Home Medications    Prior to Admission medications   Medication Sig Start Date End Date Taking? Authorizing Provider  acetaminophen (TYLENOL) 325 MG tablet Take 2 tablets (650 mg total) by mouth every 6 (six) hours as needed for mild pain (or Fever >/= 101). 09/08/17  Yes Rosita Fire, MD  allopurinol (ZYLOPRIM) 300 MG tablet Take 300 mg by mouth daily. 05/19/17  Yes [provider]  dextromethorphan-guaiFENesin (MUCINEX DM) 30-600 MG 12hr tablet Take 1 tablet by mouth 2 (two) times daily as needed for cough. 09/08/17  Yes Rosita Fire, MD  diphenoxylate-atropine (LOMOTIL) 2.5-0.025 MG tablet Take 1 tablet by mouth 4 (four) times daily as needed for diarrhea or loose stools. 09/08/17  Yes Rosita Fire, MD  furosemide (LASIX)  20 MG tablet Take 1 tablet (20 mg total) by mouth daily. 09/08/17 09/08/18 Yes Rosita Fire, MD  ondansetron (ZOFRAN-ODT) 4 MG disintegrating tablet Take 4 mg by mouth as needed. 08/14/17  Yes [provider]  pantoprazole (PROTONIX) 40 MG tablet Take 1 tablet (40 mg total) by mouth daily. 09/09/17  Yes Rosita Fire, MD  simvastatin (ZOCOR) 20 MG tablet Take 20 mg by mouth daily. 08/17/17  Yes [provider]    Family History Family History  Problem Relation Age  of Onset  . CVA Mother     Social History Social History  Substance Use Topics  . Smoking status: Former Smoker    Quit date: 08/06/2017  . Smokeless tobacco: Never Used  . Alcohol use No     Allergies   Streptomycin   Review of Systems Review of Systems  All other systems reviewed and are negative.    Physical Exam Updated Vital Signs BP (!) 59/37   Pulse 77   Temp 98.2 F (36.8 C) (Oral)   Resp (!) 21   Ht 5' (1.524 m)   Wt 98 kg (216 lb)   SpO2 98%   BMI 42.18 kg/m   Vital signs normal except for hypotension   Physical Exam  Constitutional: She is oriented to person, place, and time. She appears well-developed and well-nourished.  HENT:  Head: Normocephalic and atraumatic.  Right Ear: External ear normal.  Left Ear: External ear normal.  Nose: Nose normal.  Mouth/Throat: Mucous membranes are pale and dry.  Eyes: Pupils are equal, round, and reactive to light. EOM are normal.  Conjunctiva are pale  Neck: Normal range of motion.  Cardiovascular: Normal rate, regular rhythm and normal heart sounds.   Pulmonary/Chest: Effort normal and breath sounds normal. No respiratory distress.  Abdominal: Soft. Bowel sounds are normal. She exhibits no distension and no mass. There is no tenderness. There is no guarding.  Musculoskeletal: She exhibits edema.  Pt has 2-3 + pitting edema to knees bilaterally, no pitting edema above the knees  Neurological: She is alert and oriented to person, place, and time. No cranial nerve deficit.  Skin: Skin is warm and dry. There is pallor.  Psychiatric: She has a normal mood and affect. Her behavior is normal. Thought content normal.  Nursing note and vitals reviewed.    ED Treatments / Results  Labs (all labs ordered are listed, but only abnormal results are displayed) Results for orders placed or performed during the hospital encounter of 09/10/17  Comprehensive metabolic panel  Result Value Ref Range   Sodium 132 (L) 135  - 145 mmol/L   Potassium 4.7 3.5 - 5.1 mmol/L   Chloride 102 101 - 111 mmol/L   CO2 25 22 - 32 mmol/L   Glucose, Bld 138 (H) 65 - 99 mg/dL   BUN 42 (H) 6 - 20 mg/dL   Creatinine, Ser 0.77 0.44 - 1.00 mg/dL   Calcium 8.4 (L) 8.9 - 10.3 mg/dL   Total Protein <3.0 (L) 6.5 - 8.1 g/dL   Albumin 1.4 (L) 3.5 - 5.0 g/dL   AST 25 15 - 41 U/L   ALT 25 14 - 54 U/L   Alkaline Phosphatase 66 38 - 126 U/L   Total Bilirubin 1.2 0.3 - 1.2 mg/dL   GFR calc non Af Amer >60 >60 mL/min   GFR calc Af Amer >60 >60 mL/min   Anion gap 5 5 - 15  CBC with Differential  Result Value Ref  Range   WBC 1.9 (L) 4.0 - 10.5 K/uL   RBC 2.01 (L) 3.87 - 5.11 MIL/uL   Hemoglobin 6.8 (LL) 12.0 - 15.0 g/dL   HCT 19.7 (L) 36.0 - 46.0 %   MCV 98.0 78.0 - 100.0 fL   MCH 33.8 26.0 - 34.0 pg   MCHC 34.5 30.0 - 36.0 g/dL   RDW 20.5 (H) 11.5 - 15.5 %   Platelets 64 (L) 150 - 400 K/uL   Neutrophils Relative % 88 %   Neutro Abs 1.6 (L) 1.7 - 7.7 K/uL   Lymphocytes Relative 11 %   Lymphs Abs 0.2 (L) 0.7 - 4.0 K/uL   Monocytes Relative 1 %   Monocytes Absolute 0.0 (L) 0.1 - 1.0 K/uL   Eosinophils Relative 1 %   Eosinophils Absolute 0.0 0.0 - 0.7 K/uL   Basophils Relative 0 %   Basophils Absolute 0.0 0.0 - 0.1 K/uL  Brain natriuretic peptide  Result Value Ref Range   B Natriuretic Peptide 34.5 0.0 - 100.0 pg/mL  I-Stat CG4 Lactic Acid, ED  Result Value Ref Range   Lactic Acid, Venous 2.45 (HH) 0.5 - 1.9 mmol/L   Comment NOTIFIED PHYSICIAN   I-stat troponin, ED  Result Value Ref Range   Troponin i, poc 0.01 0.00 - 0.08 ng/mL   Comment 3          I-Stat CG4 Lactic Acid, ED  Result Value Ref Range   Lactic Acid, Venous 1.15 0.5 - 1.9 mmol/L  Type and screen [place]  Result Value Ref Range   ABO/RH(D) B NEG    Antibody Screen NEG    Sample Expiration 09/13/2017    Unit Number [id number]    Blood Component Type RED CELLS,LR    Unit division 00    Status of Unit ISSUED    Transfusion  Status OK TO TRANSFUSE    Crossmatch Result Compatible    Unit Number [id number]    Blood Component Type RED CELLS,LR    Unit division 00    Status of Unit ALLOCATED    Transfusion Status OK TO TRANSFUSE    Crossmatch Result Compatible   Prepare RBC  Result Value Ref Range   Order Confirmation ORDER PROCESSED BY BLOOD BANK   BPAM RBC  Result Value Ref Range   ISSUE DATE / TIME 622297989211    Blood Product Unit Number [id number]    PRODUCT CODE [id number]    Unit Type and Rh 1700    Blood Product Expiration Date [id number]    Blood Product Unit Number [id number]    Unit Type and Rh 1700    Blood Product Expiration Date 786767209470    Laboratory interpretation all normal except some worsening of her anemia, elevated LA, low total WBC without neutropenia, low platelets, improving LA with IV fluids   EKG  EKG Interpretation  Date/Time:  Monday September 10 2017 23:20:45 EDT Ventricular Rate:  79 PR Interval:    QRS Duration: 87 QT Interval:  363 QTC Calculation: 417 R Axis:   68 Text Interpretation:  Sinus rhythm Low voltage, extremity and precordial leads Nonspecific T abnormalities, anterior leads No significant change since last tracing 20 Aug 2017 Confirmed by Rolland Porter [phone]) on 09/11/2017 12:14:33 AM       Radiology Dg Chest Port 1 View  Result Date: 09/10/2017 CLINICAL DATA:  Hypotension EXAM: PORTABLE CHEST 1 VIEW COMPARISON:  09/01/2017, CT chest 09/02/2017 FINDINGS: Interval placement of a right-sidedcentral venous port with tip overlying  the mid SVC. Hazy bibasilar atelectasis or infiltrate at the bases. Stable cardiomediastinal silhouette. No pneumothorax. IMPRESSION: No significant interval change in left greater than right hazy bibasilar atelectasis or infiltrates. Electronically Signed   By: Donavan Foil M.D.   On: 09/10/2017 23:32     Ct Abdomen Pelvis Wo Contrast  Result Date: 09/02/2017 CLINICAL DATA:  Leukocytosis. History of dyspnea,  appendectomy and partial hysterectomy. Ureteral obstruction with nephrostomy placed on the right.IMPRESSION: 1. Supraclavicular, mediastinal, and retroperitoneal adenopathy is again noted consistent with lymphoma, metastasis or lymphoproliferative disorder. 2. Ill-defined hypodense right hepatic dome mass measuring 6.5 x 6.9 x 9.6 cm is again noted with central 2.8 cm calcification, noted to be behaving like a hemangioma on MRI from 2011. 3. Stable splenomegaly. 4. New third spacing of fluid in the subcutaneous soft tissues of the lower chest, abdomen and pelvis with small to moderate amount of free fluid in the pelvis and new bilateral pleural effusions with compressive atelectasis. 5. Calcified uterine fibroids. 6. Decompression of previously noted hydronephrosis on the right by percutaneous nephrostomy tube. No obstructing calculus in the distal right ureter. Given adenopathy, extrinsic compression from adenopathy may have contributed to the hydronephrosis and hydroureter. 7. Mild lower lumbar facet arthropathy. No acute nor suspicious osseous abnormalities. Electronically Signed   By: Ashley Royalty M.D.   On: 09/02/2017 23:59   Ct Abdomen Pelvis Wo Contrast  Result Date: 08/20/2017 CLINICAL DATA:  Abdominal painIMPRESSION: 1. Vague hypodense mass with central calcification in the right hepatic lobe presumably corresponding to the MRI mass from 2011 which was thought to represent hemangioma 2. Moderate retroperitoneal somewhat bulky adenopathy. Splenomegaly. Findings raise concern for metastatic disease or lymphoproliferative abnormality such as lymphoma. Clinical correlation is recommended 3. Negative for a bowel obstruction or colon wall thickening 4. Severe right hydronephrosis and hydroureter with distal tapering of the ureter. No definitive stones are seen, findings could be secondary to a stricture 5. Calcified uterine fibroids Electronically Signed   By: Donavan Foil M.D.   On: 08/20/2017 23:55    Ct  Head Wo Contrast  Result Date: 08/26/2017 CLINICAL DATA:  Confusion.. IMPRESSION: No evidence of acute intracranial abnormality. Unremarkable CT appearance of the brain for age. Electronically Signed   By: Logan Bores M.D.   On: 08/26/2017 18:26   Ct Soft Tissue Neck Wo Contrast  Result Date: 08/31/2017 CLINICAL DATA:  Recent retroperitoneal and supraclavicular lymph node biopsies concerning for lymphoma. Evaluation for cervical lymphadenopathy.  IMPRESSION: 1. Multiple enlarged left supraclavicular lymph nodes as previously seen. 2. The no evidence of lymphadenopathy in the upper left neck or right neck. 3. 10 mm low-density left paraglottic focus, favored to reflect a partially fluid-filled internal laryngocele although a small neoplasm is difficult to exclude on this unenhanced study. 4. Small left pleural effusion. 5.  Emphysema (ICD10-J43.9). Electronically Signed   By: Logan Bores M.D.   On: 08/31/2017 10:10   Ct Chest Wo Contrast  Result Date: 09/02/2017 CLINICAL DATA:  Leukocytosis. History of dyspnea, appendectomy and partial hysterectomy. Ureteral obstruction with nephrostomy placed on the right.  IMPRESSION: 1. Supraclavicular, mediastinal, and retroperitoneal adenopathy is again noted consistent with lymphoma, metastasis or lymphoproliferative disorder. 2. Ill-defined hypodense right hepatic dome mass measuring 6.5 x 6.9 x 9.6 cm is again noted with central 2.8 cm calcification, noted to be behaving like a hemangioma on MRI from 2011. 3. Stable splenomegaly. 4. New third spacing of fluid in the subcutaneous soft tissues of the lower chest, abdomen and pelvis  with small to moderate amount of free fluid in the pelvis and new bilateral pleural effusions with compressive atelectasis. 5. Calcified uterine fibroids. 6. Decompression of previously noted hydronephrosis on the right by percutaneous nephrostomy tube. No obstructing calculus in the distal right ureter. Given adenopathy, extrinsic  compression from adenopathy may have contributed to the hydronephrosis and hydroureter. 7. Mild lower lumbar facet arthropathy. No acute nor suspicious osseous abnormalities. Electronically Signed   By: Ashley Royalty M.D.   On: 09/02/2017 23:59   Ct Chest Wo Contrast  Result Date: 08/23/2017 CLINICAL DATA:  History of dry cough  IMPRESSION: 1. Mild emphysema and small pleural effusions. Mild posterior lower lobe bronchiectasis with associated consolidation, uncertain if this is chronic consolidation or mild infiltrate. 2. Enlarged left supraclavicular lymph node measuring up to 14 mm 3. Small pleural effusions 4. Visible spleen in the upper abdomen appears enlarged 5. Partially visualized centrally calcified mass in the liver Aortic Atherosclerosis (ICD10-I70.0) and Emphysema (ICD10-J43.9). Electronically Signed   By: Donavan Foil M.D.   On: 08/23/2017 15:34   US Guided Needle Placement  Result Date: 08/24/2017 INDICATION: 71 year old female with high-grade lymphoma of uncertain type. Repeat biopsy is requested to facilitate flow cytometry and ultimate diagnosis.  IMPRESSION: Technically successful ultrasound-guided core biopsy of left supraclavicular adenopathy. Of note, the biopsy cores were fragmented and gelatinous. Numerous biopsy passes were obtained in an attempt to acquire enough material for testing. Electronically Signed   By: Jacqulynn Cadet M.D.   On: 08/24/2017 17:09   Ir US Guide Vasc Access Right  Result Date: 09/10/2017 CLINICAL DATA:  Lymphoma, needs durable venous access for planned chemotherapy regimen.IMPRESSION: Technically successful right IJ power-injectable port catheter placement. Ready for routine use. Electronically Signed   By: Lucrezia Europe M.D.   On: 09/07/2017 08:59   Ct Biopsy  Result Date: 08/23/2017 INDICATION: 71 year old female with retroperitoneal lymphadenopathy. She presents for biopsy for tissue diagnosis. . IMPRESSION: Successful core biopsy of left periaortic  retroperitoneal lymph node. Of note, the lymph node is very deep and only 1 core could be obtained. Signed, Criselda Peaches, MD [facility] Radiology Electronically Signed   By: Jacqulynn Cadet M.D.   On: 08/23/2017 17:33   Ct Bone Marrow Biopsy & Aspiration  Result Date: 08/31/2017 CLINICAL DATA:  Lymph node biopsy has demonstrated high-grade B-cell lymphoproliferative disorder. Bone marrow biopsy required for further workup.IMPRESSION: CT guided bone marrow biopsy of left posterior iliac bone with both aspirate and core samples obtained. Electronically Signed   By: Aletta Edouard M.D.   On: 08/31/2017 12:52   Ir Nephrostomy Placement Right  Result Date: 08/21/2017 INDICATION: Right ureteral obstruction IMPRESSION: Successful right percutaneous nephrostomy catheter placement. Electronically Signed   By: Marybelle Killings M.D.   On: 08/21/2017 14:48   Ir Nephrostomy Exchange Right  Result Date: 08/31/2017 INDICATION: Status post right percutaneous nephrostomy tube placement on 08/21/2017 to treat hydronephrosis secondary to ureteral obstruction. There has been some poor output from the nephrostomy tube as well as leakage of urine at the tube exit site. IMPRESSION: Exchange of right percutaneous nephrostomy tube for new catheter over a guidewire. Nephrostogram demonstrates high-grade right ureteral obstruction in the upper pelvis at the level of the upper sacrum. Electronically Signed   By: Aletta Edouard M.D.   On: 08/31/2017 12:57   Ir Fluoro Guide Port Insertion Right  Result Date: 09/07/2017 CLINICAL DATA:  Lymphoma, needs durable venous access for planned chemotherapy regimen.  IMPRESSION: Technically successful  right IJ power-injectable port catheter placement. Ready for routine use. Electronically Signed   By: Lucrezia Europe M.D.   On: 09/07/2017 08:59    Procedures .Critical Care Performed by: Tomi Bamberger  Authorized by: Rolland Porter    Critical care provider statement:    Critical care time (minutes):  60   Critical care was necessary to treat or prevent imminent or life-threatening deterioration of the following conditions:  Circulatory failure   Critical care was time spent personally by me on the following activities:  Discussions with consultants, evaluation of patient's response to treatment, examination of patient, obtaining history from patient or surrogate, review of old charts, re-evaluation of patient's condition, pulse oximetry, ordering and review of radiographic studies and ordering and review of laboratory studies   (including critical care time)  Medications Ordered in ED Medications  norepinephrine (LEVOPHED) 4 mg in dextrose 5 % 250 mL (0.016 mg/mL) infusion (not administered)  sodium chloride 0.9 % bolus 1,000 mL (0 mLs Intravenous Stopped 09/11/17 0101)  sodium chloride 0.9 % bolus 1,000 mL (0 mLs Intravenous Stopped 09/11/17 0511)     Initial Impression / Assessment and Plan / ED Course  I have reviewed the triage vital signs and the nursing notes.  Pertinent labs & imaging results that were available during my care of the patient were reviewed by me and considered in my medical decision making (see chart for details).     Patient had persistent hypotension that it was lower than what she states her baseline is. Her initial lactic acid was elevated. She was given 1 L normal saline.  2:15 AM I reviewed Dr. Fritz Pickerel note from September 21.patient had had chemotherapy on September 20. Her hemoglobin was 8.4 and her plan was to give her 1-2 units of PR studies if her hemoglobin became worse. Patient was typed and crossed for 2 units with order to start transfusing 1.  12:47 AM Dr Harvest Forest, wants PCCM to admit patient.  01:26 AM Dr Janann Colonel, feels patient will respond to IV fluids and blood transfusion. Does not feel she needs pressors at this time. Feels hospitalist can admit and consult if she doesn't  respond to IV fluids and blood transfusion.  Pt started her blood transfusion around 3 am. She was starting  her second liter of NS.  Recheck at 04:15 AM she has almost finished her second liter of NS, blood still has about 2 hours to be transfused. BP is still 69 systolic.  56:38 AM Dr Janann Colonel, states to start norepinephrine and he will put her on the Critical Care List to be seen and admitted.   Final Clinical Impressions(s) / ED Diagnoses   Final diagnoses:  Hypotension, unspecified hypotension type  Nephrostomy tube displaced ([place])  Anemia, unspecified type    Plan admission  Rolland Porter, MD, Barbette Or, MD 09/11/17 [phone]

## 2017-09-11 NOTE — ED Notes (Signed)
Patient transported to IR 

## 2017-09-11 NOTE — Progress Notes (Signed)
: Provider:  Noah Delaine. Sheppard Coil, MD Location:  [place] Room Number: 104 Place of Service:  SNF [phone])  PCP: [name], MD Patient Care Team: [name], MD as PCP - General (Internal Medicine)  Extended Emergency Contact Information Primary Emergency Contact: Kathe Mariner [place] Phone: [phone] Mobile Phone: [phone] Relation: Spouse     Allergies: Streptomycin  Chief Complaint  Patient presents with  . New Admit To SNF    following hospitalization 08/20/17 to 09/08/17 nausea and vomiting, abdominal pain    HPI: Patient is 71 y.o. female with hypertension, hyperlipidemia, gout, and rheumatoid R arthritis treated with methotrexate who was admitted to [facility] from 9/3-22 where she was diagnosed with a diffuse large B cell lymphoma. Patient had been admitted on 08/20/17 for evaluation of 1 week history of nausea and vomiting associated with abdominal pain initial workup revealed a right renal mass with right hydronephrosis. She was placed on Cipro for UTI and scheduled to see urologist. However due to persistent nausea and vomiting she had a CT of the abdomen and pelvis done which showed a hypodense mass in the right hepatic lobe, bulky retroperitoneal lymphadenopathy and splenomegaly as well as severe right hydronephrosis and hydroureter. A right percutaneous nephrostomy tube was placed by IR and she subsequently underwent a retroperitoneal lymph node biopsy on 9/36/18 which showed a high-grade lymphoma. She also underwent a bone marrow biopsy on 08/31/17. She was started on chemotherapy by oncology. Hospital course was complicated by multiple electrolyte in balances including hypophosphatemia, hypomagnesemia hyponatremia and hypercalcemia which were resolved and by anemia and thrombocytopenia. Patient is admitted to skilled nursing facility with generalized weakness for OT/PT. While at skilled nursing  facility patient will be followed for Gout treated with allopurinol, GERD treated with Protonix, and hyperlipidemia treated with Zocor.  Past Medical History:  Diagnosis Date  . Acute urinary retention 08/29/2017  . AKI (acute kidney injury) ([place]) 08/20/2017  . Cytopenia   . DLBCL (diffuse large B cell lymphoma) ([place]) 09/03/2017  . Essential hypertension   . Gout   . HLD (hyperlipidemia)   . Hyperbilirubinemia 08/29/2017  . Hypercalcemia 08/20/2017  . Hypomagnesemia 08/29/2017  . Hypophosphatemia 08/29/2017  . Hypotension 08/20/2017  . Lymphadenopathy   . Macrocytic anemia 08/21/2017  . Nausea & vomiting 08/20/2017  . Rheumatoid arthritis ([place])   . Sepsis ([place]) 08/20/2017  . Thrombocytopenia ([place]) 08/20/2017    Past Surgical History:  Procedure Laterality Date  . APPENDECTOMY    . CARPAL TUNNEL RELEASE Right   . IR FLUORO GUIDE PORT INSERTION RIGHT  09/06/2017  . IR NEPHROSTOMY EXCHANGE RIGHT  08/31/2017  . IR NEPHROSTOMY EXCHANGE RIGHT  09/11/2017  . IR NEPHROSTOMY PLACEMENT RIGHT  08/21/2017  . IR US GUIDE VASC ACCESS RIGHT  09/06/2017  . LAPAROSCOPIC SALPINGO OOPHERECTOMY Right     Allergies as of 09/11/2017      Reactions   Streptomycin Anaphylaxis      Medication List    Notice   This visit is during an admission. Changes to the med list made in this visit will be reflected in the After Visit Summary of the admission.     Meds ordered this encounter  Medications  . ondansetron (ZOFRAN) 4 MG tablet    Sig: Take 4 mg by mouth. Take one tablet every 6 hours as needed    Immunization History  Administered Date(s) Administered  . Influenza-Unspecified 07/18/2017  . Pneumococcal Conjugate-13 11/18/2010  Social History  Substance Use Topics  . Smoking status: Former Smoker    Quit date: 08/06/2017  . Smokeless tobacco: Never Used  . Alcohol use No    Family history is   Family History  Problem Relation Age of Onset  . CVA Mother       Review of Systems  DATA  OBTAINED: from patient, nurse, medical record, family member GENERAL:  no fevers, fatigue, appetite changes SKIN: No itching, or rash EYES: No eye pain, redness, discharge EARS: No earache, tinnitus, change in hearing NOSE: No congestion, drainage or bleeding  MOUTH/THROAT: No mouth or tooth pain, No sore throat RESPIRATORY: No cough, wheezing, SOB CARDIAC: No chest pain, palpitations, lower extremity edema  GI: No abdominal pain, No N/V/D or constipation, No heartburn or reflux  GU: No dysuria, frequency or urgency, or incontinence  MUSCULOSKELETAL: No unrelieved bone/joint pain NEUROLOGIC: No headache, dizziness or focal weakness PSYCHIATRIC: No c/o anxiety or sadness   Vitals:   09/11/17 1423  BP: (!) 86/58  Pulse: 91  Resp: 20  Temp: 98.1 F (36.7 C)  SpO2: 95%    SpO2 Readings from Last 1 Encounters:  09/11/17 97%   Body mass index is 42.01 kg/m.     Physical Exam  GENERAL APPEARANCE: Alert, conversant,  No acute distress.  SKIN: No diaphoresis rash HEAD: Normocephalic, atraumatic  EYES: Conjunctiva/lids clear. Pupils round, reactive. EOMs intact.  EARS: External exam WNL, canals clear. Hearing grossly normal.  NOSE: No deformity or discharge.  MOUTH/THROAT: Lips w/o lesions  RESPIRATORY: Breathing is even, unlabored. Lung sounds are clear   CARDIOVASCULAR: Heart RRR no murmurs, rubs or gallops. No peripheral edema.   GASTROINTESTINAL: Abdomen is soft, non-tender, not distended w/ normal bowel sounds. GENITOURINARY: Bladder non tender, not distended  MUSCULOSKELETAL: No abnormal joints or musculature NEUROLOGIC:  Cranial nerves 2-12 grossly intact. Moves all extremities  PSYCHIATRIC: Mood and affect appropriate to situation, no behavioral issues  Patient Active Problem List   Diagnosis Date Noted  . DLBCL (diffuse large B cell lymphoma) ([place]) 09/03/2017  . Cytopenia   . Hypophosphatemia 08/29/2017  . Hypomagnesemia 08/29/2017  . Hyponatremia 08/29/2017  .  Acute urinary retention 08/29/2017  . Hyperbilirubinemia 08/29/2017  . Macrocytic anemia 08/21/2017  . Lymphadenopathy   . UTI (urinary tract infection) 08/20/2017  . Sepsis ([place]) 08/20/2017  . Hypotension 08/20/2017  . AKI (acute kidney injury) ([place]) 08/20/2017  . Nausea & vomiting 08/20/2017  . Hypercalcemia 08/20/2017  . Thrombocytopenia ([place]) 08/20/2017  . Cough 08/20/2017  . Rheumatoid arthritis ([place])   . Essential hypertension   . Gout   . HLD (hyperlipidemia)       Labs reviewed: Basic Metabolic Panel:    Component Value Date/Time   NA 132 (L) 09/10/2017 2319   K 4.7 09/10/2017 2319   CL 102 09/10/2017 2319   CO2 25 09/10/2017 2319   GLUCOSE 138 (H) 09/10/2017 2319   BUN 42 (H) 09/10/2017 2319   CREATININE 0.77 09/10/2017 2319   CALCIUM 8.4 (L) 09/10/2017 2319   PROT <3.0 (L) 09/10/2017 2319   ALBUMIN 1.4 (L) 09/10/2017 2319   AST 25 09/10/2017 2319   ALT 25 09/10/2017 2319   ALKPHOS 66 09/10/2017 2319   BILITOT 1.2 09/10/2017 2319   GFRNONAA >60 09/10/2017 2319   GFRAA >60 09/10/2017 2319     Recent Labs  09/02/17 0520 09/03/17 0326  09/05/17 0357  09/07/17 0515 09/08/17 0500 09/10/17 2319  NA 131* 132*  < > 135  < >  136 137 132*  K 3.1* 4.0  < > 4.3  < > 5.1 4.7 4.7  CL 98* 99*  < > 102  < > 102 104 102  CO2 24 24  < > 22  < > '22 23 25  '$ GLUCOSE 111* 104*  < > 187*  < > 220* 163* 138*  BUN 25* 25*  < > 32*  < > 45* 51* 42*  CREATININE 1.56* 1.19*  < > 1.05*  < > 0.93 0.84 0.77  CALCIUM 6.1* 6.3*  < > 6.6*  < > 6.6* 6.7* 8.4*  MG 1.5* 1.6*  --  1.8  --   --  1.8  --   PHOS 4.6 4.0  --   --   --   --  8.8*  --   < > = values in this interval not displayed. Liver Function Tests:  Recent Labs  09/06/17 0436 09/07/17 0515 09/08/17 0500 09/10/17 2319  AST 40 36  --  25  ALT 37 36  --  25  ALKPHOS 131* 119  --  66  BILITOT 1.7* 1.2  --  1.2  PROT 3.8* 3.3*  --  <3.0*  ALBUMIN 1.7* 1.6* 1.7* 1.4*    Recent Labs  08/20/17 2255  LIPASE 22    No results for input(s): AMMONIA in the last 8760 hours. CBC:  Recent Labs  09/06/17 0436 09/07/17 0515 09/08/17 0500 09/10/17 2319  WBC 8.1 5.7 5.0 1.9*  NEUTROABS 6.6 4.5  --  1.6*  HGB 9.7* 8.4* 8.3* 6.8*  HCT 28.6* 24.8* 24.4* 19.7*  MCV 99.0 99.2 98.0 98.0  PLT 61* 70* 71* 64*   Lipid No results for input(s): CHOL, HDL, LDLCALC, TRIG in the last 8760 hours.  Cardiac Enzymes: No results for input(s): CKTOTAL, CKMB, CKMBINDEX, TROPONINI in the last 8760 hours. BNP:  Recent Labs  08/31/17 1430 09/10/17 2309  BNP 21.1 34.5   No results found for: MICROALBUR No results found for: HGBA1C Lab Results  Component Value Date   TSH 4.005 09/01/2017   Lab Results  Component Value Date   KWIOXBDZ32 992 08/21/2017   Lab Results  Component Value Date   FOLATE 9.7 08/21/2017   Lab Results  Component Value Date   IRON 29 08/21/2017   TIBC 214 (L) 08/21/2017   FERRITIN 745 (H) 08/21/2017    Imaging and Procedures obtained prior to SNF admission: Dg Chest Port 1 View  Result Date: 09/10/2017 CLINICAL DATA:  Hypotension EXAM: PORTABLE CHEST 1 VIEW COMPARISON:  09/01/2017, CT chest 09/02/2017 FINDINGS: Interval placement of a right-sidedcentral venous port with tip overlying the mid SVC. Hazy bibasilar atelectasis or infiltrate at the bases. Stable cardiomediastinal silhouette. No pneumothorax. IMPRESSION: No significant interval change in left greater than right hazy bibasilar atelectasis or infiltrates. Electronically Signed   By: Donavan Foil M.D.   On: 09/10/2017 23:32   Ir Nephrostomy Exchange Right  Result Date: 09/11/2017 INDICATION: History of diffuse large B-cell lymphoma with right-sided hydro nephrosis secondary to bulky retroperitoneal lymphadenopathy, post ultrasound fluoroscopic guided nephrostomy catheter placement on 08/21/2017 and subsequent nephrostomy catheter exchange on 08/31/2017. The external portion of nephrostomy catheter has been fractured, and  as such, request has been made for fluoroscopic guided exchange. EXAM: FLUOROSCOPIC GUIDED RIGHT SIDED NEPHROSTOMY CATHETER EXCHANGE COMPARISON:  CT abdomen and pelvis - 08/20/2017 ; 08/31/2017; ultrasound fluoroscopic guided right-sided percutaneous nephrostomy catheter placement - 08/21/2017; fluoroscopic guided right-sided nephrostomy catheter exchange - 08/31/2017 CONTRAST:  10 mL Isovue-300 administered  into the collecting system FLUOROSCOPY TIME:  54 seconds (66.8 mGy) COMPLICATIONS: None immediate. TECHNIQUE: Informed written consent was obtained from the patient after a discussion of the risks, benefits and alternatives to treatment. Questions regarding the procedure were encouraged and answered. A timeout was performed prior to the initiation of the procedure. The right flank and external portion of existing nephrostomy catheter were prepped and draped in the usual sterile fashion. A sterile drape was applied covering the operative field. Maximum barrier sterile technique with sterile gowns and gloves were used for the procedure. A timeout was performed prior to the initiation of the procedure. A pre procedural spot fluoroscopic image was obtained after contrast was injected via the existing nephrostomy catheter demonstrating appropriate positioning within the renal pelvis. The existing nephrostomy catheter was cut and cannulated with a short Amplatz wire which was coiled within the renal pelvis. Under intermittent fluoroscopic guidance, the existing nephrostomy catheter was exchanged for a new 10.2 [place] all-purpose drainage catheter. Contrast injection confirmed appropriate positioning within the renal pelvis and a post exchange fluoroscopic image was obtained. The catheter was locked and secured to the skin with a suture. A dressing was placed. The patient tolerated the procedure well without immediate postprocedural complication. FINDINGS: The existing nephrostomy catheter is appropriately positioned.  After successful fluoroscopic guided exchange, the new nephrostomy catheter is coiled and locked within the right renal pelvis. Re- demonstrated marked dysmorphic appearance of the right renal collecting system. IMPRESSION: Successful fluoroscopic guided exchange of right sided 10.2 French percutaneous nephrostomy catheter. Electronically Signed   By: Sandi Mariscal M.D.   On: 09/11/2017 12:20     Not all labs, radiology exams or other studies done during hospitalization come through on my EPIC note; however they are reviewed by me.    Assessment and Plan  No problem-specific Assessment & Plan notes found for this encounter.   Noah Delaine. Sheppard Coil, MD   This encounter was created in error - please disregard.

## 2017-09-11 NOTE — Clinical Social Work Note (Signed)
Consult:  Admitted from Facility.  Please see most recent PSA completed last admission with recent discharge of 9/22 to SNF.  Patient admitting from [facility].  Spoke with Lexine Baton at facility who reports patient is at facility for [place] rehab. Family DID NOT do a bed hold, but have intentions of returning to [facility] in which [place] reports they are able.  Patient to be admitted to medical units and awaiting bed placement. Seen in Emergency room. FL2 to be completed once admitted and reviewed.  At this time, too soon to completed. Will need at time of discharge. Will have unit CSW follow up. Family voices no concerns at this time.  Lane Hacker, MSW Clinical Social Work: System Wide Float Coverage for :  [phone]   Clinical Social Work Assessment  Patient Details  Name: Kristina Torres MRN: [id number] Date of Birth: [birth year]  Date of referral:  08/28/17               Reason for consult:  Facility Placement                 Permission sought to share information with:  Family Supports Permission granted to share information::  Yes, Verbal Permission Granted             Name::     Kristina Torres             Agency::                Relationship::  Husband             Contact Information:  [phone] (h)  Housing/Transportation Living arrangements for the past 2 months:  [place] of Information:  Patient Patient Interpreter Needed:    Criminal Activity/Legal Involvement Pertinent to Current Situation/Hospitalization:  No - Comment as needed Significant Relationships:  Adult Children, Siblings, Spouse Lives with:  Spouse Do you feel safe going back to the place where you live?  No (Patient agreeable to short-term rehab prior to returning home) Need for family participation in patient care:  Yes (Comment)  Care giving concerns: Patient is aware and understanding of the need for rehab before going home. Kristina Torres reported  that her  husband is currently employed and it is she and her husband in the home.   Social Worker assessment / plan:  CSW visited the room to talk with patient regarding her discharge plan. Kristina Torres was sitting up in a chair and was alert, oriented, pleasant and engaged easily with CSW and student intern, as did her sisters. Patient's 3 sister's:  Kristina Torres), Kristina Torres, Kristina Torres), and Kristina Torres [facility]) were at the bedside. Patient also reported that they have a brother Kristina Torres who lives in [place]. Kristina Torres was advised of recommendation of ST rehab and explained the process facility search process as patient has never been to rehab before. Although they live in [place], patient requesting a facility in [place], as her husband works in [place]. Kristina Torres provided with SNF list and facility responses. Patient informed CSW that she will talk with her husband regarding the facility responses and he may visit some facilities before a decision is made. Kristina Torres aware that d/c is 9/12.  Employment status:  Retired Forensic scientist:  Information systems manager, Public librarian Supplement) PT Recommendations:  [place] / Referral to community resources:  [place] (Patient provided with SNF list for [place])  Patient/Family's Response to care:  Patient nor sisters  expressed any concerns regarding her care during hospitalization.  Patient/Family's Understanding of and Emotional Response to Diagnosis, Current Treatment, and Prognosis:  Patient voiced  an awareness of medical issues and need for rehab to assist in her physical recovery.  Emotional Assessment Appearance:  Appears younger than stated age Attitude/Demeanor/Rapport:  Other (Appropriate) Affect (typically observed):  Accepting, Adaptable, Appropriate, Calm, Pleasant Orientation:  Oriented to Self, Oriented to Place, Oriented to Situation Alcohol / Substance use:  Tobacco Use,  Alcohol Use, Illicit Drugs (Patient reports that she quit smoking 2 weeks ago and does not smoke or use illicit) Psych involvement (Current and /or in the community):  No (Comment)  Discharge Needs  Concerns to be addressed:  Discharge Planning Concerns Readmission within the last 30 days:  No Current discharge risk:  None Barriers to Discharge:  No Barriers Identified   Sable Feil, LCSW 08/28/2017, 12:54 PM

## 2017-09-11 NOTE — Progress Notes (Signed)
Referring Physician(s): [place] Physician: Sandi Mariscal  Patient Status:  [facility] - In-pt  Chief Complaint:  Dislodged right nephrostomy tube  Subjective: Pt familiar to IR service from prior rt PCN 9/4 secondary to ureteral obstruction followed by PCN exchange on 9/14 due to poor output. She has also undergone left retroperitoneal lymph node biopsy on 9/6, left supraclavicular lymph node biopsy on 9/7 and bone marrow biopsy on 9/14. She has been recently diagnosed with diffuse large B-cell lymphoma. Double-J stent placement was also recently requested due to location of high-grade ureteral obstruction and Dr. Kathlene Cote felt it was unlikely to be able traverse until after chemotherapy. She underwent right chest wall Port-A-Cath placement 09/06/17. She now presents to the ED following inadvertent dislodgment of her right nephrostomy tube at nursing facility while moving her in bed. Request received for placement of nephrostomy tube. Past Medical History:  Diagnosis Date  . Essential hypertension   . Gout   . HLD (hyperlipidemia)   . Rheumatoid arthritis [facility])    Past Surgical History:  Procedure Laterality Date  . APPENDECTOMY    . CARPAL TUNNEL RELEASE Right   . IR FLUORO GUIDE PORT INSERTION RIGHT  09/06/2017  . IR NEPHROSTOMY EXCHANGE RIGHT  08/31/2017  . IR NEPHROSTOMY PLACEMENT RIGHT  08/21/2017  . IR US GUIDE VASC ACCESS RIGHT  09/06/2017  . LAPAROSCOPIC SALPINGO OOPHERECTOMY Right       Allergies: Streptomycin  Medications: Prior to Admission medications   Medication Sig Start Date End Date Taking? Authorizing Provider  acetaminophen (TYLENOL) 325 MG tablet Take 2 tablets (650 mg total) by mouth every 6 (six) hours as needed for mild pain (or Fever >/= 101). 09/08/17  Yes Rosita Fire, MD  allopurinol (ZYLOPRIM) 300 MG tablet Take 300 mg by mouth daily. 05/19/17  Yes [provider]  dextromethorphan-guaiFENesin (MUCINEX DM) 30-600 MG 12hr  tablet Take 1 tablet by mouth 2 (two) times daily as needed for cough. 09/08/17  Yes Rosita Fire, MD  diphenoxylate-atropine (LOMOTIL) 2.5-0.025 MG tablet Take 1 tablet by mouth 4 (four) times daily as needed for diarrhea or loose stools. 09/08/17  Yes Rosita Fire, MD  furosemide (LASIX) 20 MG tablet Take 1 tablet (20 mg total) by mouth daily. 09/08/17 09/08/18 Yes Rosita Fire, MD  ondansetron (ZOFRAN-ODT) 4 MG disintegrating tablet Take 4 mg by mouth as needed. 08/14/17  Yes [provider]  pantoprazole (PROTONIX) 40 MG tablet Take 1 tablet (40 mg total) by mouth daily. 09/09/17  Yes Rosita Fire, MD  simvastatin (ZOCOR) 20 MG tablet Take 20 mg by mouth daily. 08/17/17  Yes [provider]     Vital Signs: BP (!) 96/47   Pulse 68   Temp (!) 97.5 F (36.4 C) (Oral)   Resp (!) 24   Ht 5' (1.524 m)   Wt 216 lb (98 kg)   SpO2 99%   BMI 42.18 kg/m   Physical Exam awake, alert. Chest with slightly diminished breath sounds bases;heart with regular rate and rhythm;clean, intact right chest wall Port-A-Cath; abdomen obese, soft, positive bowel sounds, nontender; 2-3+ bilateral lower extremity edema; also has upper extremity edema as well bilat  Imaging: Dg Chest Port 1 View  Result Date: 09/10/2017 CLINICAL DATA:  Hypotension EXAM: PORTABLE CHEST 1 VIEW COMPARISON:  09/01/2017, CT chest 09/02/2017 FINDINGS: Interval placement of a right-sidedcentral venous port with tip overlying the mid SVC. Hazy bibasilar atelectasis or infiltrate at the bases. Stable cardiomediastinal silhouette. No  pneumothorax. IMPRESSION: No significant interval change in left greater than right hazy bibasilar atelectasis or infiltrates. Electronically Signed   By: Donavan Foil M.D.   On: 09/10/2017 23:32    Labs:  CBC:  Recent Labs  09/06/17 0436 09/07/17 0515 09/08/17 0500 09/10/17 2319  WBC 8.1 5.7 5.0 1.9*  HGB 9.7* 8.4* 8.3* 6.8*  HCT 28.6* 24.8* 24.4*  19.7*  PLT 61* 70* 71* 64*    COAGS:  Recent Labs  08/20/17 2255 08/30/17 1447 09/03/17 1018  INR 1.16 1.23 1.19  APTT '24 27 27    '$ BMP:  Recent Labs  09/06/17 0436 09/07/17 0515 09/08/17 0500 09/10/17 2319  NA 137 136 137 132*  K 5.2* 5.1 4.7 4.7  CL 105 102 104 102  CO2 21* '22 23 25  '$ GLUCOSE 114* 220* 163* 138*  BUN 36* 45* 51* 42*  CALCIUM 7.0* 6.6* 6.7* 8.4*  CREATININE 0.98 0.93 0.84 0.77  GFRNONAA 57* >60 >60 >60  GFRAA >60 >60 >60 >60    LIVER FUNCTION TESTS:  Recent Labs  09/05/17 0357 09/06/17 0436 09/07/17 0515 09/08/17 0500 09/10/17 2319  BILITOT 2.0* 1.7* 1.2  --  1.2  AST 33 40 36  --  25  ALT 27 37 36  --  25  ALKPHOS 123 131* 119  --  66  PROT 3.3* 3.8* 3.3*  --  <3.0*  ALBUMIN 1.6* 1.7* 1.6* 1.7* 1.4*    Assessment and Plan: Patient with history of recently diagnosed large B-cell lymphoma; prior right percutaneous nephrostomy 9/4 with exchange 9/14 secondary to high-grade ureteral obstruction in upper pelvis with very little chance of traversing with JJ stent currently per Dr. Kathlene Cote; he recommends waiting until after chemotherapy has been administered for double-J stent placement to avoid multiple attempts. Status post right chest wall Port-A-Cath 09/06/17;  now presents with inadvertent dislodgment of right nephrostomy tube and request received for replacement. Details/risks of procedure, including but not limited to, internal bleeding, infection, need for new access for nephrostomy discussed with patient with her understanding and consent.   Electronically Signed: D. Rowe Robert, PA-C 09/11/2017, 11:14 AM   I spent a total of 20 minutes at the the patient's bedside AND on the patient's hospital floor or unit, greater than 50% of which was counseling/coordinating care for right nephrostomy tube replacement    Patient ID: Kristina Torres, female   DOB: 1946-10-08, 71 y.o.   MRN: [id number]

## 2017-09-11 NOTE — Procedures (Signed)
Pre Procedure Dx: Hydronephrosis Post Procedure Dx: Same  Successful right sided PCN exchange.    EBL: None   No immediate complications.   Jay Dexton Zwilling, MD Pager #: [phone]   

## 2017-09-12 DIAGNOSIS — E46 Unspecified protein-calorie malnutrition: Secondary | ICD-10-CM

## 2017-09-12 DIAGNOSIS — T451X5A Adverse effect of antineoplastic and immunosuppressive drugs, initial encounter: Secondary | ICD-10-CM

## 2017-09-12 DIAGNOSIS — D61810 Antineoplastic chemotherapy induced pancytopenia: Secondary | ICD-10-CM

## 2017-09-12 DIAGNOSIS — I959 Hypotension, unspecified: Secondary | ICD-10-CM

## 2017-09-12 DIAGNOSIS — R531 Weakness: Secondary | ICD-10-CM

## 2017-09-12 DIAGNOSIS — R601 Generalized edema: Secondary | ICD-10-CM

## 2017-09-12 DIAGNOSIS — Z936 Other artificial openings of urinary tract status: Secondary | ICD-10-CM

## 2017-09-12 DIAGNOSIS — Z9889 Other specified postprocedural states: Secondary | ICD-10-CM

## 2017-09-12 DIAGNOSIS — D649 Anemia, unspecified: Secondary | ICD-10-CM

## 2017-09-12 DIAGNOSIS — C8338 Diffuse large B-cell lymphoma, lymph nodes of multiple sites: Secondary | ICD-10-CM

## 2017-09-12 LAB — DIFFERENTIAL
BASOS ABS: 0 10*3/uL (ref 0.0–0.1)
Basophils Relative: 1 %
Eosinophils Absolute: 0 10*3/uL (ref 0.0–0.7)
Eosinophils Relative: 0 %
LYMPHS PCT: 22 %
Lymphs Abs: 0.2 10*3/uL — ABNORMAL LOW (ref 0.7–4.0)
MONO ABS: 0.1 10*3/uL (ref 0.1–1.0)
MONOS PCT: 9 %
Neutro Abs: 0.7 10*3/uL — ABNORMAL LOW (ref 1.7–7.7)
Neutrophils Relative %: 68 %

## 2017-09-12 LAB — CBC
HEMATOCRIT: 22.9 % — AB (ref 36.0–46.0)
HEMOGLOBIN: 7.9 g/dL — AB (ref 12.0–15.0)
MCH: 32.2 pg (ref 26.0–34.0)
MCHC: 34.5 g/dL (ref 30.0–36.0)
MCV: 93.5 fL (ref 78.0–100.0)
Platelets: 53 10*3/uL — ABNORMAL LOW (ref 150–400)
RBC: 2.45 MIL/uL — ABNORMAL LOW (ref 3.87–5.11)
RDW: 22.8 % — AB (ref 11.5–15.5)
WBC: 1 10*3/uL — CL (ref 4.0–10.5)

## 2017-09-12 LAB — PHOSPHORUS: Phosphorus: 4.9 mg/dL — ABNORMAL HIGH (ref 2.5–4.6)

## 2017-09-12 LAB — BASIC METABOLIC PANEL
Anion gap: 7 (ref 5–15)
BUN: 34 mg/dL — AB (ref 6–20)
CHLORIDE: 105 mmol/L (ref 101–111)
CO2: 22 mmol/L (ref 22–32)
Calcium: 8.5 mg/dL — ABNORMAL LOW (ref 8.9–10.3)
Creatinine, Ser: 0.66 mg/dL (ref 0.44–1.00)
GFR calc Af Amer: >60 mL/min (ref >60)
GFR calc non Af Amer: >60 mL/min (ref >60)
GLUCOSE: 115 mg/dL — AB (ref 65–99)
Potassium: 4 mmol/L (ref 3.5–5.1)
Sodium: 134 mmol/L — ABNORMAL LOW (ref 135–145)

## 2017-09-12 LAB — MAGNESIUM: Magnesium: 1.4 mg/dL — ABNORMAL LOW (ref 1.7–2.4)

## 2017-09-12 LAB — URINE CULTURE: CULTURE: NO GROWTH

## 2017-09-12 LAB — CHROMOSOME ANALYSIS, BONE MARROW

## 2017-09-12 LAB — PROCALCITONIN: Procalcitonin: 2.14 ng/mL

## 2017-09-12 MED ORDER — TBO-FILGRASTIM 480 MCG/0.8ML ~~LOC~~ SOSY
480.0000 ug | PREFILLED_SYRINGE | Freq: Every day | SUBCUTANEOUS | Status: DC
  Administered 2017-09-12 – 2017-09-15 (×4): 480 ug via SUBCUTANEOUS
  Filled 2017-09-12 (×5): qty 0.8

## 2017-09-12 MED ORDER — SIMVASTATIN 10 MG PO TABS
20.0000 mg | ORAL_TABLET | Freq: Every day | ORAL | Status: DC
  Administered 2017-09-12 – 2017-09-13 (×2): 20 mg via ORAL
  Filled 2017-09-12 (×3): qty 2

## 2017-09-12 MED ORDER — MAGNESIUM SULFATE 50 % IJ SOLN
3.0000 g | Freq: Once | INTRAVENOUS | Status: AC
  Administered 2017-09-12: 3 g via INTRAVENOUS
  Filled 2017-09-12: qty 4

## 2017-09-12 NOTE — Progress Notes (Signed)
PULMONARY / CRITICAL CARE MEDICINE   Name: Kristina Torres MRN: [id number] DOB: 1946-01-11    ADMISSION DATE:  09/10/2017   CONSULTATION DATE:  09/10/2017  REFERRING MD:  Dr. Tomi Bamberger  CHIEF COMPLAINT:  Hypotension  HISTORY OF PRESENT ILLNESS:  71 y.o. Female with history of multiple medical problems including rheumatoid arthritis, hyperlipidemia, history of hypertension, and diffuse large B-cell lymphoma. Patient recently hospitalized with right-sided hydronephrosis status post percutaneous nephrostomy tube and exchange on 9/14 after tube malfunction. Patient started on chemotherapy with Cytoxan and vincristine on 9/20 and subsequently discharged to rehabilitation on 9/22. Patient's tube was fractured at her nursing facility and patient was subsequently transferred to our facility for evaluation. Upon arrival the patient was noted to be hypotensive with borderline blood pressure going back for at least 2 weeks. Patient has notable anasarca but denies any chest pain or pressure. She denies any dyspnea or cough. No subjective fever or chills. Bolus IV fluid and blood products were administered in the emergency department and she was subsequently started on vasopressor support.  SUBJECTIVE:  No acute events since admission. Patient denies any abdominal pain or nausea. She reports a mildly sore throat. No chest pain or pressure.  REVIEW OF SYSTEMS:  No subjective fever or chills. No dyspnea or cough.  VITAL SIGNS: BP (!) 91/43   Pulse 70   Temp 98 F (36.7 C) (Oral)   Resp 17   Ht 5' (1.524 m)   Wt 221 lb 12.5 oz (100.6 kg) Comment: bed weight on admission to ICU  SpO2 98%   BMI 46.35 kg/m   HEMODYNAMICS:    VENTILATOR SETTINGS:    INTAKE / OUTPUT: I/O last 3 completed shifts: In: 5922.9 [I.V.:2427.9; Blood:945; IV Piggyback:2550] Out: 815 [Urine:815]  PHYSICAL EXAMINATION: General:  Awake. Alert. No acute distress. No family at bedside. Integument:  Warm & dry. No rash on exposed  skin. No bruising on exposed skin. Extremities:  No cyanosis or clubbing.  HEENT:  Tacky mucous membranes. No scleral icterus. Cardiovascular:  Regular rate. Anasarca.  Pulmonary:  Normal work of breathing on nasal cannula. Clear with auscultation bilaterally. Abdomen: Soft. Normal bowel sounds. Protuberant. Nontender. Neurological: Alert and oriented 4. No meningismus. Grossly nonfocal.  LABS:  BMET  Recent Labs Lab 09/08/17 0500 09/10/17 2319 09/12/17 0530  NA 137 132* 134*  K 4.7 4.7 4.0  CL 104 102 105  CO2 23 25 22   BUN 51* 42* 34*  CREATININE 0.84 0.77 0.66  GLUCOSE 163* 138* 115*    Electrolytes  Recent Labs Lab 09/08/17 0500 09/10/17 2319 09/12/17 0530  CALCIUM 6.7* 8.4* 8.5*  MG 1.8  --  1.4*  PHOS 8.8*  --  4.9*    CBC  Recent Labs Lab 09/08/17 0500 09/10/17 2319 09/12/17 0530  WBC 5.0 1.9* 1.0*  HGB 8.3* 6.8* 7.9*  HCT 24.4* 19.7* 22.9*  PLT 71* 64* 53*    Coag's  Recent Labs Lab 09/11/17 1415  APTT 30  INR 1.17    Sepsis Markers  Recent Labs Lab 09/10/17 2327 09/11/17 0234 09/11/17 1415 09/12/17 0530  LATICACIDVEN 2.45* 1.15  --   --   PROCALCITON  --   --  2.74 2.14    ABG No results for input(s): PHART, PCO2ART, PO2ART in the last 168 hours.  Liver Enzymes  Recent Labs Lab 09/06/17 0436 09/07/17 0515 09/08/17 0500 09/10/17 2319  AST 40 36  --  25  ALT 37 36  --  25  ALKPHOS 131* 119  --  66  BILITOT 1.7* 1.2  --  1.2  ALBUMIN 1.7* 1.6* 1.7* 1.4*    Cardiac Enzymes No results for input(s): TROPONINI, PROBNP in the last 168 hours.  Glucose No results for input(s): GLUCAP in the last 168 hours.  Imaging Ir Nephrostomy Exchange Right  Result Date: 09/11/2017 INDICATION: History of diffuse large B-cell lymphoma with right-sided hydro nephrosis secondary to bulky retroperitoneal lymphadenopathy, post ultrasound fluoroscopic guided nephrostomy catheter placement on 08/21/2017 and subsequent nephrostomy catheter  exchange on 08/31/2017. The external portion of nephrostomy catheter has been fractured, and as such, request has been made for fluoroscopic guided exchange. EXAM: FLUOROSCOPIC GUIDED RIGHT SIDED NEPHROSTOMY CATHETER EXCHANGE COMPARISON:  CT abdomen and pelvis - 08/20/2017 ; 08/31/2017; ultrasound fluoroscopic guided right-sided percutaneous nephrostomy catheter placement - 08/21/2017; fluoroscopic guided right-sided nephrostomy catheter exchange - 08/31/2017 CONTRAST:  10 mL Isovue-300 administered into the collecting system FLUOROSCOPY TIME:  54 seconds (46.9 mGy) COMPLICATIONS: None immediate. TECHNIQUE: Informed written consent was obtained from the patient after a discussion of the risks, benefits and alternatives to treatment. Questions regarding the procedure were encouraged and answered. A timeout was performed prior to the initiation of the procedure. The right flank and external portion of existing nephrostomy catheter were prepped and draped in the usual sterile fashion. A sterile drape was applied covering the operative field. Maximum barrier sterile technique with sterile gowns and gloves were used for the procedure. A timeout was performed prior to the initiation of the procedure. A pre procedural spot fluoroscopic image was obtained after contrast was injected via the existing nephrostomy catheter demonstrating appropriate positioning within the renal pelvis. The existing nephrostomy catheter was cut and cannulated with a short Amplatz wire which was coiled within the renal pelvis. Under intermittent fluoroscopic guidance, the existing nephrostomy catheter was exchanged for a new 10.2 [place] all-purpose drainage catheter. Contrast injection confirmed appropriate positioning within the renal pelvis and a post exchange fluoroscopic image was obtained. The catheter was locked and secured to the skin with a suture. A dressing was placed. The patient tolerated the procedure well without immediate  postprocedural complication. FINDINGS: The existing nephrostomy catheter is appropriately positioned. After successful fluoroscopic guided exchange, the new nephrostomy catheter is coiled and locked within the right renal pelvis. Re- demonstrated marked dysmorphic appearance of the right renal collecting system. IMPRESSION: Successful fluoroscopic guided exchange of right sided 10.2 French percutaneous nephrostomy catheter. Electronically Signed   By: Sandi Mariscal M.D.   On: 09/11/2017 12:20     STUDIES:  TTE 08/30/17:  LV normal in size with EF 60-65%. Grade 1 diastolic dysfunction. Normal regional wall motion. LA & RA normal in size. RV normal size and function. No aortic stenosis or regurgitation. Aortic root normal in size. Trivial mitral regurgitation without stenosis. No significant pulmonic regurgitation. Trivial tricuspid regurgitation. No pericardial effusion.  PORT CXR 9/24:  Previously reviewed by me. Questionable right hazy basilar opacity. Otherwise no new focal and salivation or pleural effusion appreciated.  MICROBIOLOGY: MRSA PCR 9/25:  Negative  Urine Culture 9/25 >>> Blood Cultures x2 9/25 >>>  ANTIBIOTICS: Vancomycin 9/25 >>> Zosyn 9/25 >>>  SIGNIFICANT EVENTS: 09/25 - Admit w/ chronic hypotension & s/p replacement of right perc nephrostomy tube. Given 1u PRBC in ED.  LINES/TUBES: R CHEST PORT 09/06/17 >>> R PERC NEPHROSTOMY TUBE 9/25 >>> FOLEY >>> PIV  ASSESSMENT / PLAN:  71 y.o. female with pancytopenia presenting with hypotension. Elevated Procalcitonin concerning for possible sepsis. Vasopressor requirement has improved some since yesterday.  1. Hypotension/possible  septic shock: Continuing vasopressor support with Levophed weaning for goal MAP >60 & SBP >90. Awaiting finalization of cultures. Trending Procalcitonin per algorithm. Continuing empiric vancomycin & Zosyn day #2. 2. Questionable adrenal insufficiency: Continuing stress dosed  hydrocortisone. 3. Right-sided hydronephrosis: Status post replacement of percutaneous nephrostomy tube. Monitoring urine output with Foley catheter & nephrostomy tube. 4. Pancytopenia: Secondary to chemotherapy administered last on 9/20. Maintaining hemoglobin greater than 7.0. 5. Hypomagnesemia: Magnesium sulfate 3 g IV ordered. Repeat magnesium level in a.m. 6. Diffuse large B-cell lymphoma: Following with oncology. Holding on further chemotherapy in the setting of possible septic shock. 7. Severe protein-calorie malnutrition: Diet per nutrition consult. 8. Rheumatoid arthritis: Previously/remotely on methotrexate. Not currently on any DMARDs. 9. H/O hyperlipidemia:  Restarting home Zocor. 10. H/O essential hypertension: Holding home Lasix.  Prophylaxis: SCDs & Protonix by mouth daily. Diet:  Consulting dietician for diet recommendations. Clear liquid diet for now. Code Status: Full code. Disposition:  Remains in the ICU while titrating her vasopressor requirement. Family Update:  Patient updated at the time of my exam.   I have spent a total of 31 minutes of critical care time today caring for the patient and reviewing the patient's electronic medical record.   Sonia Baller Ashok Cordia, M.D. [facility] Pulmonary & Critical [facility] Pager:  [phone] After 3pm or if no response, call [phone] 7:30 AM 09/12/17

## 2017-09-12 NOTE — Progress Notes (Signed)
Initial Nutrition Assessment  DOCUMENTATION CODES:   Morbid obesity  INTERVENTION:  - Diet advancement as medically feasible. - Pt's husband bringing [employer], each supplement provides 250 kcal and 9 grams of protein. - Continue to encourage PO intakes. - Will place a note for pt to have plastic utensils for meals.   NUTRITION DIAGNOSIS:   Increased nutrient needs related to catabolic illness, cancer and cancer related treatments as evidenced by estimated needs.  GOAL:   Patient will meet greater than or equal to 90% of their needs  MONITOR:   PO intake, Supplement acceptance, Diet advancement, Weight trends, Labs, Skin  REASON FOR ASSESSMENT:   Consult Assessment of nutrition requirement/status  ASSESSMENT:   71 y.o. Female with history of multiple medical problems including rheumatoid arthritis, hyperlipidemia, history of hypertension, and diffuse large B-cell lymphoma. Patient recently hospitalized with right-sided hydronephrosis status post percutaneous nephrostomy tube and exchange on 9/14 after tube malfunction. Patient started on chemotherapy with Cytoxan and vincristine on 9/20 and subsequently discharged to rehabilitation on 9/22. Patient's tube was fractured at her nursing facility and patient was subsequently transferred to [facility] for evaluation.  Pt seen for consult. BMI indicates morbid obesity. Pt on CLD and has only had sips this AM. Pt was recently hospitalized and RD saw pt several times throughout that admission; most recent RD note from 09/06/17. Pt was mainly eating <25% of meals during that admission. She states that from d/c on 9/22 until readmission on 9/24 she was eating more and eating a wider variety of foods. She denies pain with swallowing or abdominal pain or nausea associated with eating and drinking. She does report that she was drinking Boost Breeze after d/c but that she only likes orange flavor (hospital formulary includes peach and  mixed berry flavors only). She has been having taste alteration (metallic taste) while going through chemo and states that using plastic utensils helps with this.   Physical assessment shows no muscle and no fat wasting. Moderate edema to BUE and severe edema to BLE. Per chart review, weight +3 lbs since 9/22. Will continue to monitor weight trends closely, especially given degree of edema.  Medications reviewed; 50 mg Solu-cortef QID, 40 mg oral Protonix/day.  Labs reviewed; Na: 134 mmol/L, BUN: 34 mg/dL, Ca: 8.5 mg/dL, Phos: 4.9 mg/dL, Mg: 1.4 mg/dL.  IVF: NS @ 100 mL/hr.    Diet Order:  Diet clear liquid Room service appropriate? Yes; Fluid consistency: Thin  Skin:  Reviewed, no issues  Last BM:  9/23 (PTA, per pt report)  Height:   Ht Readings from Last 1 Encounters:  09/10/17 5' (1.524 m)    Weight:   Wt Readings from Last 1 Encounters:  09/11/17 221 lb 12.5 oz (100.6 kg)    Ideal Body Weight:  47.73 kg  BMI:  Body mass index is 46.35 kg/m.  Estimated Nutritional Needs:   Kcal:  2010-2215 (20-22 kcal/kg)  Protein:  100-110 grams  Fluid:  1.6-1.8 L/day  EDUCATION NEEDS:   No education needs identified at this time    Jarome Matin, MS, RD, LDN, CNSC Inpatient Clinical Dietitian Pager # [phone] After hours/weekend pager # [phone]

## 2017-09-12 NOTE — Progress Notes (Signed)
CRITICAL VALUE ALERT  Critical Value:  WBC 1.0  Date & Time Notied:  09/12/17 @ 9977  Provider Notified: called Warren Lacy  Orders Received/Actions taken: no orders at this time

## 2017-09-12 NOTE — Care Management Note (Addendum)
Case Management Note  Patient Details  Name: Kristina Torres MRN: [id number] Date of Birth: [birth year]  Subjective/Objective:                  Neutropenia wbc down to 1.0/hypotensive on iv levophed drip/iv abx/iv mag.  Action/Plan: Date:  September 12, 2017 Chart reviewed for concurrent status and case management needs.  Will continue to follow patient progress.  Discharge Planning: following for needs  Expected discharge date: [id number]  Velva Harman, BSN, [place], [place]   Expected Discharge Date:   (UNKNOWN)               Expected Discharge Plan:  [place]  In-House Referral:     Discharge planning Services  CM Consult  Post Acute Care Choice:  Resumption of Svcs/PTA Provider Choice offered to:  Patient  DME Arranged:    DME Agency:     HH Arranged:    [place] Agency:     Status of Service:  In process, will continue to follow  If discussed at Long Length of Stay Meetings, dates discussed:    Additional Comments:  Leeroy Cha, RN 09/12/2017, 7:47 AM

## 2017-09-12 NOTE — Progress Notes (Signed)
Kristina Torres   DOB:[birth year]   AY#:[id number]   ATF#:[id number]  Hematology and Oncology service follow up note   Subjective: Pt is well-known to me, was recently diagnosed diffuse large B-cell lymphoma, received first cycle chemotherapy R-CVP last week, discharged to rehabilitation on 9/22, and readmitted on 09/10/2017 after incidental nephrostomy tube fracture, and was admitted fto MICU for hypotension. Patient is awake, alert, feels weak, on IVF, pressor, and broad antibiotics.  Objective:  Vitals:   09/12/17 2000 09/12/17 2100  BP: (!) 94/32 (!) 97/41  Pulse:    Resp: (!) 25 18  Temp:    SpO2: 97% 97%    Body mass index is 46.35 kg/m.  Intake/Output Summary (Last 24 hours) at 09/12/17 2234 Last data filed at 09/12/17 2000  Gross per 24 hour  Intake          4124.75 ml  Output              870 ml  Net          3254.75 ml     Sclerae unicteric  Oropharynx clear  No peripheral adenopathy In her neck, supraclavicular, axilla or groin area  Lungs clear -- no rales or rhonchi  Heart regular rate and rhythm  Abdomen benign  MSK: Diffuse anasarca, ecchymosis on her arms  Neuro nonfocal, somnolent    CBG (last 3)  No results for input(s): GLUCAP in the last 72 hours.   Labs:  Urine Studies No results for input(s): UHGB, CRYS in the last 72 hours.  Invalid input(s): UACOL, UAPR, USPG, UPH, UTP, UGL, UKET, UBIL, UNIT, UROB, [place], UEPI, UWBC, Duwayne Heck [place], [place]  Basic Metabolic Panel:  Recent Labs Lab 09/06/17 0436 09/07/17 0515 09/08/17 0500 09/10/17 2319 09/12/17 0530  NA 137 136 137 132* 134*  K 5.2* 5.1 4.7 4.7 4.0  CL 105 102 104 102 105  CO2 21* 22 23 25 22   GLUCOSE 114* 220* 163* 138* 115*  BUN 36* 45* 51* 42* 34*  CREATININE 0.98 0.93 0.84 0.77 0.66  CALCIUM 7.0* 6.6* 6.7* 8.4* 8.5*  MG  --   --  1.8  --  1.4*  PHOS  --   --  8.8*  --  4.9*   GFR Estimated Creatinine Clearance: 68.7 mL/min (by C-G formula based on SCr of 0.66 mg/dL). Liver  Function Tests:  Recent Labs Lab 09/06/17 0436 09/07/17 0515 09/08/17 0500 09/10/17 2319  AST 40 36  --  25  ALT 37 36  --  25  ALKPHOS 131* 119  --  66  BILITOT 1.7* 1.2  --  1.2  PROT 3.8* 3.3*  --  <3.0*  ALBUMIN 1.7* 1.6* 1.7* 1.4*   No results for input(s): LIPASE, AMYLASE in the last 168 hours. No results for input(s): AMMONIA in the last 168 hours. Coagulation profile  Recent Labs Lab 09/11/17 1415  INR 1.17    CBC:  Recent Labs Lab 09/06/17 0436 09/07/17 0515 09/08/17 0500 09/10/17 2319 09/12/17 0530  WBC 8.1 5.7 5.0 1.9* 1.0*  NEUTROABS 6.6 4.5  --  1.6* 0.7*  HGB 9.7* 8.4* 8.3* 6.8* 7.9*  HCT 28.6* 24.8* 24.4* 19.7* 22.9*  MCV 99.0 99.2 98.0 98.0 93.5  PLT 61* 70* 71* 64* 53*   Cardiac Enzymes: No results for input(s): CKTOTAL, CKMB, CKMBINDEX, TROPONINI in the last 168 hours. BNP: Invalid input(s): POCBNP CBG: No results for input(s): GLUCAP in the last 168 hours. D-Dimer No results for input(s): DDIMER in the last  72 hours. Hgb A1c No results for input(s): HGBA1C in the last 72 hours. Lipid Profile No results for input(s): CHOL, HDL, LDLCALC, TRIG, CHOLHDL, LDLDIRECT in the last 72 hours. Thyroid function studies No results for input(s): TSH, T4TOTAL, T3FREE, THYROIDAB in the last 72 hours.  Invalid input(s): FREET3 Anemia work up No results for input(s): VITAMINB12, FOLATE, FERRITIN, TIBC, IRON, RETICCTPCT in the last 72 hours. Microbiology Recent Results (from the past 240 hour(s))  C difficile quick scan w PCR reflex     Status: None   Collection Time: 09/03/17  2:25 PM  Result Value Ref Range Status   C Diff antigen NEGATIVE NEGATIVE Final   C Diff toxin NEGATIVE NEGATIVE Final   C Diff interpretation No C. difficile detected.  Final  MRSA PCR Screening     Status: None   Collection Time: 09/11/17  1:29 PM  Result Value Ref Range Status   MRSA by PCR NEGATIVE NEGATIVE Final    Comment:        The GeneXpert MRSA Assay  (FDA approved for NASAL specimens only), is one component of a comprehensive MRSA colonization surveillance program. It is not intended to diagnose MRSA infection nor to guide or monitor treatment for MRSA infections.   Urine culture     Status: None   Collection Time: 09/11/17  4:50 PM  Result Value Ref Range Status   Specimen Description URINE, CATHETERIZED  Final   Special Requests NONE  Final   Culture   Final    NO GROWTH Performed at [facility], [address], [place], [place] [address]    Report Status 09/12/2017 FINAL  Final    Pathology report  Diagnosis 08/31/2017 Bone Marrow, Aspirate,Biopsy, and Clot, left iliac - HYPERCELLULAR BONE MARROW WITH INVOLVEMENT BY A B-CELL LYMPHOPROLIFERATIVE DISORDER. - SEE COMMENT. PERIPHERAL BLOOD: - NORMOCYTIC-NORMOCHROMIC ANEMIA. - CIRCULATING ATYPICAL LYMPHOID CELLS. - THROMBOCYTOPENIA. Diagnosis Note The bone marrow is prominently involved by a B-cell lymphoproliferative process with morphologic and phenotypic features of large B-cell lymphoma. 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 While a significant proportion of patients with this disorder show at least partial regression in response to drug withdrawal, most require cytotoxic therapy. Clinical and cytogenetic correlation is recommended. (BNS:ecj 09/03/2017).  Studies:  Dg Chest Port 1 View  Result Date: 09/10/2017 CLINICAL DATA:  Hypotension EXAM: PORTABLE CHEST 1 VIEW COMPARISON:  09/01/2017, CT chest 09/02/2017 FINDINGS: Interval placement of a right-sidedcentral venous port with tip overlying the mid SVC. Hazy bibasilar atelectasis or infiltrate at the bases. Stable cardiomediastinal silhouette. No pneumothorax. IMPRESSION: No significant interval change in left greater than right hazy bibasilar atelectasis or infiltrates. Electronically  Signed   By: Donavan Foil M.D.   On: 09/10/2017 23:32   Ir Nephrostomy Exchange Right  Result Date: 09/11/2017 INDICATION: History of diffuse large B-cell lymphoma with right-sided hydro nephrosis secondary to bulky retroperitoneal lymphadenopathy, post ultrasound fluoroscopic guided nephrostomy catheter placement on 08/21/2017 and subsequent nephrostomy catheter exchange on 08/31/2017. The external portion of nephrostomy catheter has been fractured, and as such, request has been made for fluoroscopic guided exchange. EXAM: FLUOROSCOPIC GUIDED RIGHT SIDED NEPHROSTOMY CATHETER EXCHANGE COMPARISON:  CT abdomen and pelvis - 08/20/2017 ; 08/31/2017; ultrasound fluoroscopic guided right-sided percutaneous nephrostomy catheter placement - 08/21/2017; fluoroscopic guided right-sided nephrostomy catheter exchange - 08/31/2017 CONTRAST:  10 mL Isovue-300 administered into the collecting system FLUOROSCOPY TIME:  54 seconds (06.3 mGy) COMPLICATIONS: None immediate. TECHNIQUE:  Informed written consent was obtained from the patient after a discussion of the risks, benefits and alternatives to treatment. Questions regarding the procedure were encouraged and answered. A timeout was performed prior to the initiation of the procedure. The right flank and external portion of existing nephrostomy catheter were prepped and draped in the usual sterile fashion. A sterile drape was applied covering the operative field. Maximum barrier sterile technique with sterile gowns and gloves were used for the procedure. A timeout was performed prior to the initiation of the procedure. A pre procedural spot fluoroscopic image was obtained after contrast was injected via the existing nephrostomy catheter demonstrating appropriate positioning within the renal pelvis. The existing nephrostomy catheter was cut and cannulated with a short Amplatz wire which was coiled within the renal pelvis. Under intermittent fluoroscopic guidance, the existing  nephrostomy catheter was exchanged for a new 10.2 [place] all-purpose drainage catheter. Contrast injection confirmed appropriate positioning within the renal pelvis and a post exchange fluoroscopic image was obtained. The catheter was locked and secured to the skin with a suture. A dressing was placed. The patient tolerated the procedure well without immediate postprocedural complication. FINDINGS: The existing nephrostomy catheter is appropriately positioned. After successful fluoroscopic guided exchange, the new nephrostomy catheter is coiled and locked within the right renal pelvis. Re- demonstrated marked dysmorphic appearance of the right renal collecting system. IMPRESSION: Successful fluoroscopic guided exchange of right sided 10.2 French percutaneous nephrostomy catheter. Electronically Signed   By: Sandi Mariscal M.D.   On: 09/11/2017 12:20    Assessment: Kristina Torres is a 71 y.o. female with a history of HTN, gout, HLD and Rheumatoid Arthritis on MTX, presented with severe right hydronephrosis, fatigue, nausea,.and weakness    1. Hypotension, rule out sepsis  2. Diffuse large B cell lymphoma, with bone marrow involvement, stage IV, IPS 4, high risk  2. Pancytopenia, secondary to #2 and chemo  3. Severe anasarca and hypoalbuminemia  4. RA, MTX on hold for now  5. HTN 6. Right hydronephrosis secondary to ureteral obstruction, status post nephrostomy tube placement 7. History of severe hypercalcemia, resolved  8. Nausea and vomiting, anorexia  9. Severe calorie and protein malnutrition   Plan:  -appreciate the excellent care from the MICU team  -I agree with broad antibiotics, she is immunocompromised due to her lymphoma and recent chemotherapy, follow up with ID work up  -Continue G-CSF 43mcg daily until ANC>1.5k -blood transfusion to keep Hb>8.0 -Due to her severe calorie and protein malnutrition, anasarca, I would recommend nutrition consult, to consider TPN, will discuss with ICU  tomorrow -I will follow up   Truitt Merle, MD 09/12/2017

## 2017-09-13 LAB — CBC WITH DIFFERENTIAL/PLATELET
BASOS PCT: 1 %
Basophils Absolute: 0 10*3/uL (ref 0.0–0.1)
EOS PCT: 0 %
Eosinophils Absolute: 0 10*3/uL (ref 0.0–0.7)
HCT: 22.3 % — ABNORMAL LOW (ref 36.0–46.0)
HEMOGLOBIN: 7.7 g/dL — AB (ref 12.0–15.0)
Lymphocytes Relative: 32 %
Lymphs Abs: 0.4 10*3/uL — ABNORMAL LOW (ref 0.7–4.0)
MCH: 32.4 pg (ref 26.0–34.0)
MCHC: 34.5 g/dL (ref 30.0–36.0)
MCV: 93.7 fL (ref 78.0–100.0)
Monocytes Absolute: 0.1 10*3/uL (ref 0.1–1.0)
Monocytes Relative: 8 %
NEUTROS ABS: 0.6 10*3/uL — AB (ref 1.7–7.7)
NEUTROS PCT: 59 %
Platelets: 48 10*3/uL — ABNORMAL LOW (ref 150–400)
RBC: 2.38 MIL/uL — ABNORMAL LOW (ref 3.87–5.11)
RDW: 22.5 % — ABNORMAL HIGH (ref 11.5–15.5)
WBC: 1.1 10*3/uL — CL (ref 4.0–10.5)

## 2017-09-13 LAB — RENAL FUNCTION PANEL
Albumin: 1.5 g/dL — ABNORMAL LOW (ref 3.5–5.0)
Anion gap: 5 (ref 5–15)
BUN: 32 mg/dL — ABNORMAL HIGH (ref 6–20)
CHLORIDE: 109 mmol/L (ref 101–111)
CO2: 22 mmol/L (ref 22–32)
CREATININE: 0.7 mg/dL (ref 0.44–1.00)
Calcium: 8.8 mg/dL — ABNORMAL LOW (ref 8.9–10.3)
GFR calc non Af Amer: >60 mL/min (ref >60)
GLUCOSE: 139 mg/dL — AB (ref 65–99)
Phosphorus: 4.5 mg/dL (ref 2.5–4.6)
Potassium: 3.5 mmol/L (ref 3.5–5.1)
SODIUM: 136 mmol/L (ref 135–145)

## 2017-09-13 LAB — CREATININE, SERUM
CREATININE: 0.71 mg/dL (ref 0.44–1.00)
GFR calc Af Amer: >60 mL/min (ref >60)

## 2017-09-13 LAB — VANCOMYCIN, TROUGH: Vancomycin Tr: 28 ug/mL (ref 15–20)

## 2017-09-13 LAB — PROCALCITONIN: PROCALCITONIN: 1.43 ng/mL

## 2017-09-13 LAB — MAGNESIUM: Magnesium: 1.7 mg/dL (ref 1.7–2.4)

## 2017-09-13 MED ORDER — TRAMADOL HCL 50 MG PO TABS
50.0000 mg | ORAL_TABLET | Freq: Four times a day (QID) | ORAL | Status: DC | PRN
  Administered 2017-09-13 – 2017-10-02 (×5): 50 mg via ORAL
  Filled 2017-09-13 (×5): qty 1

## 2017-09-13 MED ORDER — VANCOMYCIN HCL 10 G IV SOLR
1250.0000 mg | INTRAVENOUS | Status: DC
Start: 2017-09-14 — End: 2017-09-15
  Administered 2017-09-14: 1250 mg via INTRAVENOUS
  Filled 2017-09-13: qty 1250

## 2017-09-13 MED ORDER — SODIUM CHLORIDE 0.9% FLUSH
10.0000 mL | INTRAVENOUS | Status: DC | PRN
  Administered 2017-09-17 – 2017-09-21 (×3): 10 mL
  Filled 2017-09-13 (×3): qty 40

## 2017-09-13 MED ORDER — PREMIER PROTEIN SHAKE
2.0000 [oz_av] | Freq: Four times a day (QID) | ORAL | Status: DC
  Administered 2017-09-13 – 2017-10-02 (×41): 2 [oz_av] via ORAL
  Filled 2017-09-13 (×87): qty 325.31

## 2017-09-13 NOTE — Progress Notes (Signed)
Pharmacy Antibiotic Note  Kristina Torres is a 71 y.o. 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  She was recently discharged on 9/22 but presented back to the ED on 9/24 for hypotension and dislodgement of nephrostomy tube.  Started on broad abx with vancomycin and zosyn for possible septic shock.  Day #3 Vancomycin and Zosyn.  Urine culture negative.  No results yet from blood cultures obtained on admission. Remains neutropenic.  R-CHOP given 9/21-22. Granix started 9/26.  Weaned off pressor support.    Plan: Check vancomycin trough tonight. Continue Zosyn 3.375g IV q8h (4 hour infusion time).  F/u blood cultures and narrow when able.  Daily SCr. ____________________________________  Height: 5' (152.4 cm) Weight: 221 lb 12.5 oz (100.6 kg) (bed weight on admission to ICU) IBW/kg (Calculated) : 45.5  Temp (24hrs), Avg:97.9 F (36.6 C), Min:97.2 F (36.2 C), Max:98.2 F (36.8 C)   Recent Labs Lab 09/07/17 0515 09/08/17 0500 09/10/17 2319 09/10/17 2327 09/11/17 0234 09/12/17 0530 09/13/17 0415  WBC 5.7 5.0 1.9*  --   --  1.0* 1.1*  CREATININE 0.93 0.84 0.77  --   --  0.66 0.71  0.70  LATICACIDVEN  --   --   --  2.45* 1.15  --   --     Estimated Creatinine Clearance: 68.7 mL/min (by C-G formula based on SCr of 0.71 mg/dL).    Allergies  Allergen Reactions  . Streptomycin Anaphylaxis   Antimicrobials this admission:  9/25 vanc>> 9/25 zosyn>>  Dose adjustments this admission:  9/27 VT = ___ on 1250 mg q12h  Microbiology results:  9/25 BCx:  9/25 UCx: NGF 9/25 MRSA PCR: neg  Thank you for allowing pharmacy to be a part of this patient's care.  Hershal Coria 09/13/2017 10:36 AM

## 2017-09-13 NOTE — Progress Notes (Signed)
Brief Pharmacy Note re: Vancomycin  For complete details see note from earlier today by A. Runyon, PharmD  O:  Vancomycin trough = 28 on 1250mg  IV q12h (goal 15-20)  A/P:  Vancomycin trough elevated.  RN has already administered tonight's dose.    Decrease Vancomycin 1250mg  IV q24h- next dose at 6p on 9/28.  Netta Cedars, PharmD, BCPS 09/13/2017@6 :47 PM

## 2017-09-13 NOTE — Consult Note (Signed)
   [facility] CM Inpatient Consult   09/13/2017  Aldena Worm [birth year] [id number]   Patient screened for potential [facility] Care Management services due to recent readmission.   Chart reviewed. Currently in ICU/stepdown unit. From SNF.   Will continue to follow and engage for [facility] Care Management if/when appropriate.    Marthenia Rolling, MSN-Ed, RN,BSN [facility] Liaison [phone]

## 2017-09-13 NOTE — Progress Notes (Signed)
PULMONARY / CRITICAL CARE MEDICINE   Name: Kristina Torres MRN: [id number] DOB: 03-Jan-1946    ADMISSION DATE:  09/10/2017   CONSULTATION DATE:  09/10/2017  REFERRING MD:  Dr. Tomi Bamberger  CHIEF COMPLAINT:  Hypotension  HISTORY OF PRESENT ILLNESS:  71 y.o. Female with history of multiple medical problems including rheumatoid arthritis, hyperlipidemia, history of hypertension, and diffuse large B-cell lymphoma. Patient recently hospitalized with right-sided hydronephrosis status post percutaneous nephrostomy tube and exchange on 9/14 after tube malfunction. Patient started on chemotherapy with Cytoxan and vincristine on 9/20 and subsequently discharged to rehabilitation on 9/22. Patient's tube was fractured at her nursing facility and patient was subsequently transferred to our facility for evaluation. Upon arrival the patient was noted to be hypotensive with borderline blood pressure going back for at least 2 weeks. Patient has notable anasarca but denies any chest pain or pressure. She denies any dyspnea or cough. No subjective fever or chills. Bolus IV fluid and blood products were administered in the emergency department and she was subsequently started on vasopressor support.  SUBJECTIVE:  Transitioned off vasopressors yesterday. Patient is hungry. Denies any abdominal pain, nausea, vomiting, or diarrhea. No chest pain or pressure. Concerned because she continues to have intermittent weeping from her edema.  REVIEW OF SYSTEMS:  No subjective fever or chills. No dyspnea. Minimal cough that patient reports was present prior to admission.  VITAL SIGNS: BP (!) 98/58   Pulse 70   Temp 98.2 F (36.8 C) (Oral)   Resp 18   Ht 5' (1.524 m)   Wt 221 lb 12.5 oz (100.6 kg) Comment: bed weight on admission to ICU  SpO2 95%   BMI 46.35 kg/m   HEMODYNAMICS:    VENTILATOR SETTINGS:    INTAKE / OUTPUT: I/O last 3 completed shifts: In: 6031.2 [P.O.:480; I.V.:4795.2; IV Piggyback:756] Out: 1180  [Urine:1180]  PHYSICAL EXAMINATION: General:  No distress. Family member bedside. Awake. Integument:  No rash on exposed skin. Warm. Dry. Weeping from anasarca. Extremities:  No cyanosis or clubbing.  HEENT:  Tacky mucous no scleral icterus. No scleral injection. Cardiovascular:  Regular rate. Unable to appreciate JVD. Anasarca present.  Pulmonary:  Normal work of breathing on nasal cannula. Otherwise clear to auscultation. Abdomen: Protuberant. Nontender. Normal bowel sounds. Neurological: Oriented 4. No meningismus. Nonfocal.  LABS:  BMET  Recent Labs Lab 09/10/17 2319 09/12/17 0530 09/13/17 0415  NA 132* 134* 136  K 4.7 4.0 3.5  CL 102 105 109  CO2 25 22 22   BUN 42* 34* 32*  CREATININE 0.77 0.66 0.71  0.70  GLUCOSE 138* 115* 139*    Electrolytes  Recent Labs Lab 09/08/17 0500 09/10/17 2319 09/12/17 0530 09/13/17 0415  CALCIUM 6.7* 8.4* 8.5* 8.8*  MG 1.8  --  1.4* 1.7  PHOS 8.8*  --  4.9* 4.5    CBC  Recent Labs Lab 09/10/17 2319 09/12/17 0530 09/13/17 0415  WBC 1.9* 1.0* 1.1*  HGB 6.8* 7.9* 7.7*  HCT 19.7* 22.9* 22.3*  PLT 64* 53* 48*    Coag's  Recent Labs Lab 09/11/17 1415  APTT 30  INR 1.17    Sepsis Markers  Recent Labs Lab 09/10/17 2327 09/11/17 0234 09/11/17 1415 09/12/17 0530 09/13/17 0415  LATICACIDVEN 2.45* 1.15  --   --   --   PROCALCITON  --   --  2.74 2.14 1.43    ABG No results for input(s): PHART, PCO2ART, PO2ART in the last 168 hours.  Liver Enzymes  Recent Labs Lab 09/07/17 0515  09/08/17 0500 09/10/17 2319 09/13/17 0415  AST 36  --  25  --   ALT 36  --  25  --   ALKPHOS 119  --  66  --   BILITOT 1.2  --  1.2  --   ALBUMIN 1.6* 1.7* 1.4* 1.5*    Cardiac Enzymes No results for input(s): TROPONINI, PROBNP in the last 168 hours.  Glucose No results for input(s): GLUCAP in the last 168 hours.  Imaging No results found.   STUDIES:  TTE 08/30/17:  LV normal in size with EF 60-65%. Grade 1 diastolic  dysfunction. Normal regional wall motion. LA & RA normal in size. RV normal size and function. No aortic stenosis or regurgitation. Aortic root normal in size. Trivial mitral regurgitation without stenosis. No significant pulmonic regurgitation. Trivial tricuspid regurgitation. No pericardial effusion.  PORT CXR 9/24:  Previously reviewed by me. Questionable right hazy basilar opacity. Otherwise no new focal and salivation or pleural effusion appreciated.  MICROBIOLOGY: MRSA PCR 9/25:  Negative  Urine Culture 9/25:  Negative  Blood Cultures x2 9/25 >>>  ANTIBIOTICS: Vancomycin 9/25 >>> Zosyn 9/25 >>>  SIGNIFICANT EVENTS: 09/25 - Admit w/ chronic hypotension & s/p replacement of right perc nephrostomy tube. Given 1u PRBC in ED. 09/26 - Transitioned off pressors in afternoon  LINES/TUBES: R CHEST PORT 09/06/17 >>> R PERC NEPHROSTOMY TUBE 9/25 >>> FOLEY >>> PIV  ASSESSMENT / PLAN:  71 y.o. female with pancytopenia post chemotherapy. Hypotension improving and patient has no vasopressor requirement. Patient educated regarding her nutritional status today. Appreciate input from oncology.   1. Hypotension/possible septic shock: Trending Procalcitonin per algorithm. Continuing empiric vancomycin & Zosyn day #3. Awaiting finalization of blood cultures. Discontinuing Levophed. 2. Possible adrenal insufficiency: Continuing stress dosed hydrocortisone IV every 6 hours. Can consider weaning to every 12 hours and beginning week long taper tomorrow if she remains stable. 3. Right-sided hydronephrosis: Status post replacement of percutaneous nephrostomy tube. Monitoring urine output with Foley catheter as well as nephrostomy tube. 4. Pancytopenia: Monitoring cell counts daily. Last chemotherapy 9/20. Appreciate input from oncology. Continuing Granix. 5. Diffuse large B-cell lymphoma: Following with oncology. No further chemotherapy pending clinical improvement. 6. Severe protein-calorie malnutrition:  Dietary consult. Adding protein supplementation to dietary regimen. 7. Rheumatoid arthritis: Previously/remotely on methotrexate. Not currently on any DMARDs. 8. H/O hyperlipidemia:  Continuing home Zocor. 9. H/O essential hypertension: Holding home Lasix.  Prophylaxis: SCDs & Protonix by mouth daily. Diet:  Starting regular diet. Ordering protein supplementation. Code Status: Full code. Disposition:  Transitioning to stepdown status. Family Update:  Patient and family member updated at the time of my exam.  I have spent a total of 37 minutes of time today caring for the patient, reviewing the patient's electronic medical record, and with more than 50% of that time spent coordinating transfer of care with the patient as well as reviewing the continuing plan of care with the patient at bedside.  TRH to assume care & PCCM off as of 9/28.  Sonia Baller Ashok Cordia, M.D. [facility] Pulmonary & Critical Care Pager:  [phone] After 3pm or if no response, call [phone] 9:59 AM 09/13/17

## 2017-09-14 DIAGNOSIS — R799 Abnormal finding of blood chemistry, unspecified: Secondary | ICD-10-CM

## 2017-09-14 LAB — CBC WITH DIFFERENTIAL/PLATELET
BASOS ABS: 0 10*3/uL (ref 0.0–0.1)
Basophils Relative: 2 %
Eosinophils Absolute: 0 10*3/uL (ref 0.0–0.7)
Eosinophils Relative: 0 %
HEMATOCRIT: 22.8 % — AB (ref 36.0–46.0)
Hemoglobin: 7.7 g/dL — ABNORMAL LOW (ref 12.0–15.0)
LYMPHS PCT: 49 %
Lymphs Abs: 0.8 10*3/uL (ref 0.7–4.0)
MCH: 32 pg (ref 26.0–34.0)
MCHC: 33.8 g/dL (ref 30.0–36.0)
MCV: 94.6 fL (ref 78.0–100.0)
MONOS PCT: 10 %
Monocytes Absolute: 0.1 10*3/uL (ref 0.1–1.0)
NEUTROS ABS: 0.5 10*3/uL — AB (ref 1.7–7.7)
NEUTROS PCT: 39 %
Platelets: 50 10*3/uL — ABNORMAL LOW (ref 150–400)
RBC: 2.41 MIL/uL — AB (ref 3.87–5.11)
RDW: 21.9 % — ABNORMAL HIGH (ref 11.5–15.5)
WBC: 1.4 10*3/uL — CL (ref 4.0–10.5)

## 2017-09-14 LAB — TYPE AND SCREEN
ABO/RH(D): B NEG
Antibody Screen: NEGATIVE
UNIT DIVISION: 0
Unit division: 0

## 2017-09-14 LAB — BPAM RBC
Blood Product Expiration Date: 201810132359
Blood Product Expiration Date: 201810132359
ISSUE DATE / TIME: 201809250224
UNIT TYPE AND RH: 1700
Unit Type and Rh: 1700

## 2017-09-14 LAB — MAGNESIUM: MAGNESIUM: 1.7 mg/dL (ref 1.7–2.4)

## 2017-09-14 LAB — COMPREHENSIVE METABOLIC PANEL
ALK PHOS: 56 U/L (ref 38–126)
ALT: 30 U/L (ref 14–54)
ANION GAP: 6 (ref 5–15)
AST: 21 U/L (ref 15–41)
Albumin: 1.5 g/dL — ABNORMAL LOW (ref 3.5–5.0)
BILIRUBIN TOTAL: 1.5 mg/dL — AB (ref 0.3–1.2)
BUN: 36 mg/dL — ABNORMAL HIGH (ref 6–20)
CALCIUM: 8.4 mg/dL — AB (ref 8.9–10.3)
CO2: 20 mmol/L — AB (ref 22–32)
CREATININE: 0.8 mg/dL (ref 0.44–1.00)
Chloride: 108 mmol/L (ref 101–111)
Glucose, Bld: 151 mg/dL — ABNORMAL HIGH (ref 65–99)
Potassium: 3.3 mmol/L — ABNORMAL LOW (ref 3.5–5.1)
Sodium: 134 mmol/L — ABNORMAL LOW (ref 135–145)

## 2017-09-14 LAB — TISSUE HYBRIDIZATION TO NCBH

## 2017-09-14 LAB — PHOSPHORUS: PHOSPHORUS: 3.9 mg/dL (ref 2.5–4.6)

## 2017-09-14 LAB — HEMOGLOBIN AND HEMATOCRIT, BLOOD
HEMATOCRIT: 28 % — AB (ref 36.0–46.0)
HEMOGLOBIN: 9.5 g/dL — AB (ref 12.0–15.0)

## 2017-09-14 LAB — PREPARE RBC (CROSSMATCH)

## 2017-09-14 LAB — CREATININE, SERUM
Creatinine, Ser: 0.82 mg/dL (ref 0.44–1.00)
GFR calc non Af Amer: >60 mL/min (ref >60)

## 2017-09-14 MED ORDER — SODIUM CHLORIDE 0.9% FLUSH
5.0000 mL | Freq: Three times a day (TID) | INTRAVENOUS | Status: DC
  Administered 2017-09-14 – 2017-09-18 (×8): 5 mL via INTRAVENOUS

## 2017-09-14 MED ORDER — MAGIC MOUTHWASH W/LIDOCAINE
5.0000 mL | Freq: Four times a day (QID) | ORAL | Status: DC
  Administered 2017-09-14 – 2017-09-15 (×4): 5 mL via ORAL
  Filled 2017-09-14 (×19): qty 5

## 2017-09-14 MED ORDER — SODIUM CHLORIDE 0.9 % IV SOLN
Freq: Once | INTRAVENOUS | Status: AC
  Administered 2017-09-14: 08:00:00 via INTRAVENOUS

## 2017-09-14 MED ORDER — PRO-STAT SUGAR FREE PO LIQD
30.0000 mL | Freq: Two times a day (BID) | ORAL | Status: DC
  Administered 2017-09-14 – 2017-09-15 (×3): 30 mL via ORAL
  Filled 2017-09-14 (×4): qty 30

## 2017-09-14 NOTE — Progress Notes (Signed)
Kristina Torres   DOB:[birth year]   EX#:[id number]   LFY#:[id number]  Hematology and Oncology service follow up note   Subjective: Pt has been off pressor, VS stable, she is eating little better, still quite weak.   Objective:  Vitals:   09/14/17 2200 09/14/17 2300  BP: 112/61 (!) 113/56  Pulse:    Resp: 14 19  Temp:    SpO2: 94% 93%    Body mass index is 50.41 kg/m.  Intake/Output Summary (Last 24 hours) at 09/14/17 2322 Last data filed at 09/14/17 2300  Gross per 24 hour  Intake             4340 ml  Output              540 ml  Net             3800 ml     Sclerae unicteric  Oropharynx clear  No peripheral adenopathy In her neck, supraclavicular, axilla or groin area  Lungs clear -- no rales or rhonchi  Heart regular rate and rhythm  Abdomen benign  MSK: Diffuse anasarca, ecchymosis on her arms  Neuro nonfocal, somnolent    CBG (last 3)  No results for input(s): GLUCAP in the last 72 hours.   Labs:  Urine Studies No results for input(s): UHGB, CRYS in the last 72 hours.  Invalid input(s): UACOL, UAPR, USPG, UPH, UTP, UGL, UKET, UBIL, UNIT, UROB, [place], UEPI, UWBC, [place], [place], [place], [place], [place]  Basic Metabolic Panel:  Recent Labs Lab 09/08/17 0500 09/10/17 2319 09/12/17 0530 09/13/17 0415 09/14/17 0303  NA 137 132* 134* 136 134*  K 4.7 4.7 4.0 3.5 3.3*  CL 104 102 105 109 108  CO2 23 25 22 22  20*  GLUCOSE 163* 138* 115* 139* 151*  BUN 51* 42* 34* 32* 36*  CREATININE 0.84 0.77 0.66 0.71  0.70 0.80  0.82  CALCIUM 6.7* 8.4* 8.5* 8.8* 8.4*  MG 1.8  --  1.4* 1.7 1.7  PHOS 8.8*  --  4.9* 4.5 3.9   GFR Estimated Creatinine Clearance: 70.6 mL/min (by C-G formula based on SCr of 0.82 mg/dL). Liver Function Tests:  Recent Labs Lab 09/08/17 0500 09/10/17 2319 09/13/17 0415 09/14/17 0303  AST  --  25  --  21  ALT  --  25  --  30  ALKPHOS  --  66  --  56  BILITOT  --  1.2  --  1.5*  PROT  --  <3.0*  --  <3.0*  ALBUMIN 1.7* 1.4* 1.5* 1.5*   No results  for input(s): LIPASE, AMYLASE in the last 168 hours. No results for input(s): AMMONIA in the last 168 hours. Coagulation profile  Recent Labs Lab 09/11/17 1415  INR 1.17    CBC:  Recent Labs Lab 09/08/17 0500 09/10/17 2319 09/12/17 0530 09/13/17 0415 09/14/17 0303 09/14/17 1930  WBC 5.0 1.9* 1.0* 1.1* 1.4*  --   NEUTROABS  --  1.6* 0.7* 0.6* 0.5*  --   HGB 8.3* 6.8* 7.9* 7.7* 7.7* 9.5*  HCT 24.4* 19.7* 22.9* 22.3* 22.8* 28.0*  MCV 98.0 98.0 93.5 93.7 94.6  --   PLT 71* 64* 53* 48* 50*  --    Cardiac Enzymes: No results for input(s): CKTOTAL, CKMB, CKMBINDEX, TROPONINI in the last 168 hours. BNP: Invalid input(s): POCBNP CBG: No results for input(s): GLUCAP in the last 168 hours. D-Dimer No results for input(s): DDIMER in the last 72 hours. Hgb A1c No results for input(s): HGBA1C in  the last 72 hours. Lipid Profile No results for input(s): CHOL, HDL, LDLCALC, TRIG, CHOLHDL, LDLDIRECT in the last 72 hours. Thyroid function studies No results for input(s): TSH, T4TOTAL, T3FREE, THYROIDAB in the last 72 hours.  Invalid input(s): FREET3 Anemia work up No results for input(s): VITAMINB12, FOLATE, FERRITIN, TIBC, IRON, RETICCTPCT in the last 72 hours. Microbiology Recent Results (from the past 240 hour(s))  MRSA PCR Screening     Status: None   Collection Time: 09/11/17  1:29 PM  Result Value Ref Range Status   MRSA by PCR NEGATIVE NEGATIVE Final    Comment:        The GeneXpert MRSA Assay (FDA approved for NASAL specimens only), is one component of a comprehensive MRSA colonization surveillance program. It is not intended to diagnose MRSA infection nor to guide or monitor treatment for MRSA infections.   Urine culture     Status: None   Collection Time: 09/11/17  4:50 PM  Result Value Ref Range Status   Specimen Description URINE, CATHETERIZED  Final   Special Requests NONE  Final   Culture   Final    NO GROWTH Performed at [facility], [address] [place], [place] [address]    Report Status 09/12/2017 FINAL  Final  Culture, blood (routine x 2)     Status: None (Preliminary result)   Collection Time: 09/11/17 11:35 PM  Result Value Ref Range Status   Specimen Description BLOOD LEFT HAND  Final   Special Requests IN PEDIATRIC BOTTLE Blood Culture adequate volume  Final   Culture   Final    NO GROWTH 2 DAYS Performed at [facility], [place] [address], [place], [place] [address]    Report Status PENDING  Incomplete  Culture, blood (routine x 2)     Status: None (Preliminary result)   Collection Time: 09/11/17 11:35 PM  Result Value Ref Range Status   Specimen Description BLOOD RIGHT HAND  Final   Special Requests IN PEDIATRIC BOTTLE Blood Culture adequate volume  Final   Culture   Final    NO GROWTH 2 DAYS Performed at [facility], [place] [address], [place], [place] [address]    Report Status PENDING  Incomplete    Pathology report  Diagnosis 08/31/2017 Bone Marrow, Aspirate,Biopsy, and Clot, left iliac - HYPERCELLULAR BONE MARROW WITH INVOLVEMENT BY A B-CELL LYMPHOPROLIFERATIVE DISORDER. - SEE COMMENT. PERIPHERAL BLOOD: - NORMOCYTIC-NORMOCHROMIC ANEMIA. - CIRCULATING ATYPICAL LYMPHOID CELLS. - THROMBOCYTOPENIA. Diagnosis Note The bone marrow is prominently involved by a B-cell lymphoproliferative process with morphologic and phenotypic features of large B-cell lymphoma. 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 While a significant proportion of patients with this disorder show at least partial regression in response to drug withdrawal, most require cytotoxic therapy. Clinical and cytogenetic correlation is recommended. (BNS:ecj 09/03/2017).  Studies:  No results found.  Assessment: Kristina Torres is a 71 y.o. female with a history of HTN, gout, HLD and Rheumatoid Arthritis on MTX,  presented with severe right hydronephrosis, fatigue, nausea,.and weakness    1. Hypotension, sepsis vs Adrenal insufficiency, resolved now  2. Diffuse large B cell lymphoma, with bone marrow involvement, stage IV, IPS 4, high risk, s/p first cycle chemo R-CVP last week  2. Pancytopenia, secondary to #2 and chemo  3. Severe anasarca and hypoalbuminemia  4. RA, MTX on hold for now  5. HTN 6. Right hydronephrosis secondary to ureteral  obstruction, status post nephrostomy tube placement 7. History of severe hypercalcemia, resolved  8. Nausea and vomiting, anorexia  9. Severe calorie and protein malnutrition   Plan:  -Her culture has been negative, she is still on stress dose steroids, hypotension resolved, overall improved. -antibiotics per primary team  -continue supportive care with blood transfusion to keep Hb>8.0, she received 1 unit of RBCs today -continue granix until ANC>1.5k -No need plt transfusion unless PLT<20K or active bleeding  -I spoke with Dr. Ashok Cordia about TPN, due to the concern of risk of severe infection, will hold on TPN for now, encourage pt to take nutritional supplement by mouth -I will ask my partner to see her on Sunday, please call us if needed.   -pt would like to go to a SNF in [place], I'll transfer her oncology care to Dr. Hinton Rao at [facility] after discharge    Truitt Merle, MD 09/14/2017

## 2017-09-14 NOTE — Progress Notes (Signed)
[facility] Progress Note  Patient: Kristina Torres [id number]:[id number]   PCP: [name], MD DOB: 1946/10/28   DOA: 09/10/2017   DOS: 09/14/2017   Date of Service: the patient was seen and examined on 09/14/2017  Subjective: Feeling better, no nausea no vomiting. Tolerating oral diet. No diarrhea no constipation. Has, swelling of her arms and legs.  Brief hospital course: Pt. with PMH of DLBCL, RA, HLD, HTN; admitted on 09/10/2017, presented with complaint of low blood pressure, was found to have suspected sepsis. Currently further plan is continue current plan. 71 y.o. Female with history of multiple medical problems including rheumatoid arthritis, hyperlipidemia, history of hypertension, and diffuse large B-cell lymphoma. Patient recently hospitalized with right-sided hydronephrosis status post percutaneous nephrostomy tube and exchange on 9/14 after tube malfunction. Patient started on chemotherapy with Cytoxan and vincristine on 9/20 and subsequently discharged to rehabilitation on 9/22. Patient's tube was fractured at her nursing facility and patient was subsequently transferred to our facility for evaluation. Upon arrival the patient was noted to be hypotensive with borderline blood pressure going back for at least 2 weeks. Patient has notable anasarca but denies any chest pain or pressure. She denies any dyspnea or cough. No subjective fever or chills. Bolus IV fluid and blood products were administered in the emergency department and she was subsequently started on vasopressor support. Assessment and Plan: 1. Hypotension, possible septic shock. Patient was started on empiric vancomycin and Zosyn. Currently day 3. Blood culture so far no growth. Patient was high pole tensive requiring IV fluid bolus as well as IV pressors, currently off of the pressors since yesterday. Blood pressure getting better on IV fluids. We'll monitor.  2. Right-sided hydronephrosis. S/P replacement of  percutaneous nephrostomy tube. Urine output adequate. Monitor with Foley catheter.  3. DLB CL. Oncology following in the hospital. Currently chemotherapy on hold given suspected sepsis.  4. Severe protein calorie malnutrition. Dietitian consulted. Continue protein supplement.  5. Rheumatoid arthritis. On methotrexate in the past currently not on any medication. Stable.  6. Anemia. We will transfuse 1 PRBC based on recommendations from oncology to maintain hemoglobin more than 8.  7. Neutropenia. Oncology recommends to continue on G-CSF on a daily basis until [facility] is more than 1500.  Diet: Cardiac diet DVT Prophylaxis: mechanical compression device  Advance goals of care discussion: full code  Family Communication: no family was present at bedside, at the time of interview.   Disposition:  Discharge to home.  Consultants: onco, PCCM  Procedures: none  Antibiotics: Anti-infectives    Start     Dose/Rate Route Frequency Ordered Stop   09/14/17 1800  vancomycin (VANCOCIN) 1,250 mg in sodium chloride 0.9 % 250 mL IVPB     1,250 mg 166.7 mL/hr over 90 Minutes Intravenous Every 24 hours 09/13/17 1845     09/12/17 0400  vancomycin (VANCOCIN) 1,250 mg in sodium chloride 0.9 % 250 mL IVPB  Status:  Discontinued     1,250 mg 166.7 mL/hr over 90 Minutes Intravenous Every 12 hours 09/11/17 0952 09/13/17 1845   09/11/17 1600  piperacillin-tazobactam (ZOSYN) IVPB 3.375 g     3.375 g 12.5 mL/hr over 240 Minutes Intravenous Every 8 hours 09/11/17 0952     09/11/17 0945  vancomycin (VANCOCIN) IVPB 1000 mg/200 mL premix     1,000 mg 200 mL/hr over 60 Minutes Intravenous  Once 09/11/17 0930 09/11/17 1646   09/11/17 0945  piperacillin-tazobactam (ZOSYN) IVPB 3.375 g     3.375 g 100 mL/hr over 30  Minutes Intravenous  Once 09/11/17 0930 09/11/17 1218       Objective: Physical Exam: Vitals:   09/14/17 1600 09/14/17 1700 09/14/17 1800 09/14/17 1815  BP: (!) 107/55 (!) 109/55 (!)  105/50 130/64  Pulse:    77  Resp: 19 19 19 17   Temp: 97.7 F (36.5 C)   97.8 F (36.6 C)  TempSrc: Oral   Oral  SpO2: 94% 94% 93% 93%  Weight:      Height:        Intake/Output Summary (Last 24 hours) at 09/14/17 1952 Last data filed at 09/14/17 1819  Gross per 24 hour  Intake             3778 ml  Output              540 ml  Net             3238 ml   Filed Weights   09/10/17 2259 09/11/17 2200 09/14/17 0500  Weight: 98 kg (216 lb) 100.6 kg (221 lb 12.5 oz) 109.4 kg (241 lb 2.9 oz)   General: Alert, Awake and Oriented to Time, Place and Person. Appear in moderate distress, affect appropriate Eyes: PERRL, Conjunctiva normal ENT: Oral Mucosa clear moist. Neck: difficult to assess JVD, no Abnormal Mass Or lumps Cardiovascular: S1 and S2 Present, no Murmur, Peripheral Pulses Present Respiratory: normal respiratory effort, Bilateral Air entry equal and Decreased, no use of accessory muscle, Clear to Auscultation, no Crackles, no wheezes Abdomen: Bowel Sound present, Soft and no tenderness, no hernia Skin: no redness, no Rash, no induration Extremities: bilateral Pedal edema, no calf tenderness Neurologic: Grossly no focal neuro deficit. Bilaterally Equal motor strength  Data Reviewed: CBC:  Recent Labs Lab 09/08/17 0500 09/10/17 2319 09/12/17 0530 09/13/17 0415 09/14/17 0303  WBC 5.0 1.9* 1.0* 1.1* 1.4*  NEUTROABS  --  1.6* 0.7* 0.6* 0.5*  HGB 8.3* 6.8* 7.9* 7.7* 7.7*  HCT 24.4* 19.7* 22.9* 22.3* 22.8*  MCV 98.0 98.0 93.5 93.7 94.6  PLT 71* 64* 53* 48* 50*   Basic Metabolic Panel:  Recent Labs Lab 09/08/17 0500 09/10/17 2319 09/12/17 0530 09/13/17 0415 09/14/17 0303  NA 137 132* 134* 136 134*  K 4.7 4.7 4.0 3.5 3.3*  CL 104 102 105 109 108  CO2 23 25 22 22  20*  GLUCOSE 163* 138* 115* 139* 151*  BUN 51* 42* 34* 32* 36*  CREATININE 0.84 0.77 0.66 0.71  0.70 0.80  0.82  CALCIUM 6.7* 8.4* 8.5* 8.8* 8.4*  MG 1.8  --  1.4* 1.7 1.7  PHOS 8.8*  --  4.9* 4.5  3.9    Liver Function Tests:  Recent Labs Lab 09/08/17 0500 09/10/17 2319 09/13/17 0415 09/14/17 0303  AST  --  25  --  21  ALT  --  25  --  30  ALKPHOS  --  66  --  56  BILITOT  --  1.2  --  1.5*  PROT  --  <3.0*  --  <3.0*  ALBUMIN 1.7* 1.4* 1.5* 1.5*   No results for input(s): LIPASE, AMYLASE in the last 168 hours. No results for input(s): AMMONIA in the last 168 hours. Coagulation Profile:  Recent Labs Lab 09/11/17 1415  INR 1.17   Cardiac Enzymes: No results for input(s): CKTOTAL, CKMB, CKMBINDEX, TROPONINI in the last 168 hours. BNP (last 3 results) No results for input(s): PROBNP in the last 8760 hours. CBG: No results for input(s): GLUCAP in the last 168 hours.  Studies: No results found.  Scheduled Meds: . Chlorhexidine Gluconate Cloth  6 each Topical q morning - 10a  . feeding supplement (PRO-STAT SUGAR FREE 64)  30 mL Oral BID  . hydrocortisone sod succinate (SOLU-CORTEF) inj  50 mg Intravenous Q6H  . magic mouthwash w/lidocaine  5 mL Oral QID  . pantoprazole  40 mg Oral Daily  . protein supplement shake  2 oz Oral QID  . sodium chloride flush  5 mL Intravenous Q8H  . Tbo-filgastrim (GRANIX) SQ  480 mcg Subcutaneous q1800   Continuous Infusions: . sodium chloride 250 mL (09/14/17 1600)  . sodium chloride 100 mL/hr at 09/14/17 1600  . piperacillin-tazobactam (ZOSYN)  IV 3.375 g (09/14/17 1700)  . vancomycin 1,250 mg (09/14/17 1819)   PRN Meds: sodium chloride, ondansetron, phenol, sodium chloride flush, traMADol  Time spent: 35 minutes  Author: Berle Mull, MD [facility] Pager: [phone] 09/14/2017 7:52 PM  If 7PM-7AM, please contact night-coverage at [email or web address], password [facility]

## 2017-09-14 NOTE — Progress Notes (Addendum)
Patient's BP low at 60/22. Patient states that she doesn't feel right. Paged Dr. Elsworth Soho. Rechecked 3rd time in different location. BP 109/48. Continue to monitor.

## 2017-09-15 ENCOUNTER — Inpatient Hospital Stay (HOSPITAL_COMMUNITY): Payer: Medicare Other

## 2017-09-15 ENCOUNTER — Encounter: Payer: Self-pay | Admitting: Internal Medicine

## 2017-09-15 LAB — COMPREHENSIVE METABOLIC PANEL
ALBUMIN: 1.6 g/dL — AB (ref 3.5–5.0)
ALK PHOS: 53 U/L (ref 38–126)
ALT: 30 U/L (ref 14–54)
ANION GAP: 5 (ref 5–15)
AST: 20 U/L (ref 15–41)
BILIRUBIN TOTAL: 1.7 mg/dL — AB (ref 0.3–1.2)
BUN: 38 mg/dL — AB (ref 6–20)
CALCIUM: 8.2 mg/dL — AB (ref 8.9–10.3)
CO2: 20 mmol/L — ABNORMAL LOW (ref 22–32)
CREATININE: 0.87 mg/dL (ref 0.44–1.00)
Chloride: 111 mmol/L (ref 101–111)
GFR calc Af Amer: >60 mL/min (ref >60)
GFR calc non Af Amer: >60 mL/min (ref >60)
GLUCOSE: 113 mg/dL — AB (ref 65–99)
Potassium: 3 mmol/L — ABNORMAL LOW (ref 3.5–5.1)
Sodium: 136 mmol/L (ref 135–145)
TOTAL PROTEIN: 3 g/dL — AB (ref 6.5–8.1)

## 2017-09-15 LAB — CBC WITH DIFFERENTIAL/PLATELET
BASOS ABS: 0 10*3/uL (ref 0.0–0.1)
Basophils Relative: 1 %
EOS ABS: 0 10*3/uL (ref 0.0–0.7)
Eosinophils Relative: 0 %
HCT: 27.8 % — ABNORMAL LOW (ref 36.0–46.0)
Hemoglobin: 9.6 g/dL — ABNORMAL LOW (ref 12.0–15.0)
LYMPHS ABS: 2.3 10*3/uL (ref 0.7–4.0)
LYMPHS PCT: 71 %
MCH: 31.3 pg (ref 26.0–34.0)
MCHC: 34.5 g/dL (ref 30.0–36.0)
MCV: 90.6 fL (ref 78.0–100.0)
MONO ABS: 0.3 10*3/uL (ref 0.1–1.0)
Monocytes Relative: 10 %
NEUTROS ABS: 0.6 10*3/uL — AB (ref 1.7–7.7)
Neutrophils Relative %: 18 %
PLATELETS: 52 10*3/uL — AB (ref 150–400)
RBC: 3.07 MIL/uL — ABNORMAL LOW (ref 3.87–5.11)
RDW: 22.8 % — AB (ref 11.5–15.5)
WBC: 3.2 10*3/uL — ABNORMAL LOW (ref 4.0–10.5)

## 2017-09-15 LAB — BPAM RBC
Blood Product Expiration Date: 201810132359
ISSUE DATE / TIME: 201809281503
Unit Type and Rh: 1700

## 2017-09-15 LAB — PROTIME-INR
INR: 1.23
Prothrombin Time: 15.4 seconds — ABNORMAL HIGH (ref 11.4–15.2)

## 2017-09-15 LAB — TYPE AND SCREEN
ABO/RH(D): B NEG
Antibody Screen: NEGATIVE
Unit division: 0

## 2017-09-15 LAB — MAGNESIUM: Magnesium: 1.7 mg/dL (ref 1.7–2.4)

## 2017-09-15 LAB — LACTIC ACID, PLASMA: Lactic Acid, Venous: 2.1 mmol/L (ref 0.5–1.9)

## 2017-09-15 MED ORDER — HYDROCORTISONE NA SUCCINATE PF 100 MG IJ SOLR
50.0000 mg | Freq: Two times a day (BID) | INTRAMUSCULAR | Status: DC
  Administered 2017-09-15 – 2017-09-17 (×4): 50 mg via INTRAVENOUS
  Filled 2017-09-15 (×4): qty 2

## 2017-09-15 MED ORDER — SENNOSIDES-DOCUSATE SODIUM 8.6-50 MG PO TABS: 1.0000 | ORAL_TABLET | Freq: Two times a day (BID) | ORAL | Status: DC

## 2017-09-15 MED ORDER — POLYETHYLENE GLYCOL 3350 17 G PO PACK
17.0000 g | PACK | Freq: Every day | ORAL | Status: DC
  Filled 2017-09-15 (×3): qty 1

## 2017-09-15 MED ORDER — FUROSEMIDE 10 MG/ML IJ SOLN
40.0000 mg | Freq: Once | INTRAMUSCULAR | Status: AC
  Administered 2017-09-15: 40 mg via INTRAVENOUS
  Filled 2017-09-15: qty 4

## 2017-09-15 MED ORDER — DEXTROSE 5 % IV SOLN
2.0000 g | INTRAVENOUS | Status: DC
  Administered 2017-09-15 – 2017-09-16 (×2): 2 g via INTRAVENOUS
  Filled 2017-09-15 (×2): qty 2

## 2017-09-15 MED ORDER — POTASSIUM CHLORIDE CRYS ER 20 MEQ PO TBCR
40.0000 meq | EXTENDED_RELEASE_TABLET | ORAL | Status: AC
  Administered 2017-09-15 (×2): 40 meq via ORAL
  Filled 2017-09-15: qty 2

## 2017-09-15 MED ORDER — OXYBUTYNIN CHLORIDE 5 MG PO TABS: 5.0000 mg | ORAL_TABLET | Freq: Three times a day (TID) | ORAL | Status: DC | PRN

## 2017-09-15 MED ORDER — ALBUMIN HUMAN 25 % IV SOLN
12.5000 g | Freq: Once | INTRAVENOUS | Status: AC
  Administered 2017-09-15: 12.5 g via INTRAVENOUS
  Filled 2017-09-15: qty 50

## 2017-09-15 NOTE — Progress Notes (Signed)
Pt. had a critical LA of 2.1, K+=3.0 E-link nurse notified. Will inform MD. Awaiting further orders.

## 2017-09-15 NOTE — Progress Notes (Signed)
Pharmacy Antibiotic Note  Kristina Torres is a 71 y.o. 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  She was recently discharged on 9/22 but presented back to the ED on 9/24 for hypotension and dislodgement of nephrostomy tube.  Started on broad abx with vancomycin and zosyn for possible septic shock.  Day #3 Vancomycin and Zosyn.  Urine culture negative.  No results yet from blood cultures obtained on admission. Remains neutropenic.  R-CHOP given 9/21-22. Granix started 9/26.  Weaned off pressor support.    Day #5 antibiotics: Vanc and Zosyn d/c'ed today.  Pharmacy received consult to dose Ceftriaxone for UTI.  Last Zosyn dose was 9/29 @ 0800.  Plan: Ceftriaxone 2gm IV q24h (higher dosing due to BMI = 47) Need for further dosage adjustment appears unlikely at present.    Will sign off at this time.  Please reconsult if a change in clinical status warrants re-evaluation of dosage.  ____________________________________  Height: 5' (152.4 cm) Weight: 243 lb 13.3 oz (110.6 kg) IBW/kg (Calculated) : 45.5  Temp (24hrs), Avg:97.8 F (36.6 C), Min:97.4 F (36.3 C), Max:98.6 F (37 C)   Recent Labs Lab 09/10/17 2319 09/10/17 2327 09/11/17 0234 09/12/17 0530 09/13/17 0415 09/13/17 1420 09/14/17 0303 09/15/17 0500  WBC 1.9*  --   --  1.0* 1.1*  --  1.4* 3.2*  CREATININE 0.77  --   --  0.66 0.71  0.70  --  0.80  0.82 0.87  LATICACIDVEN  --  2.45* 1.15  --   --   --   --  2.1*  VANCOTROUGH  --   --   --   --   --  28*  --   --     Estimated Creatinine Clearance: 66.9 mL/min (by C-G formula based on SCr of 0.87 mg/dL).    Allergies  Allergen Reactions  . Streptomycin Anaphylaxis   Antimicrobials this admission:  9/25 vanc>> 9/29 9/25 zosyn>> 9/29   Dose adjustments this admission:  9/27 VT = 28 on 1250 mg q12h :  changed to q24h  Microbiology results:  9/25 BCx: NGTD 9/25 UCx: NGF 9/25 MRSA PCR: neg  Thank you for allowing pharmacy to be a part of this patient's care.  Everette Rank, PharmD 09/15/2017 3:23 PM

## 2017-09-15 NOTE — Progress Notes (Signed)
[facility] Progress Note  Patient: Kristina Torres TDV:[id number]   PCP: [name], MD DOB: 01-Mar-1946   DOA: 09/10/2017   DOS: 09/15/2017   Date of Service: the patient was seen and examined on 09/15/2017  Subjective: Having some nausea this morning. Not passing gas for last 2 days. Feeling constipated. Has some abdominal pain. Breathing is better. Swelling is still remains. No active bleeding. Complaints about bladder spasm  Brief hospital course: Pt. with PMH of DLBCL, RA, HLD, HTN; admitted on 09/10/2017, presented with complaint of low blood pressure, was found to have suspected sepsis. Currently further plan is continue current plan. 71 y.o. Female with history of multiple medical problems including rheumatoid arthritis, hyperlipidemia, history of hypertension, and diffuse large B-cell lymphoma. Patient recently hospitalized with right-sided hydronephrosis status post percutaneous nephrostomy tube and exchange on 9/14 after tube malfunction. Patient started on chemotherapy with Cytoxan and vincristine on 9/20 and subsequently discharged to rehabilitation on 9/22. Patient's tube was fractured at her nursing facility and patient was subsequently transferred to our facility for evaluation. Upon arrival the patient was noted to be hypotensive with borderline blood pressure going back for at least 2 weeks. Patient has notable anasarca but denies any chest pain or pressure. She denies any dyspnea or cough. No subjective fever or chills. Bolus IV fluid and blood products were administered in the emergency department and she was subsequently started on vasopressor support.  Assessment and Plan: 1. Hypotension, possible septic shock. Patient was started on empiric vancomycin and Zosyn. Currently day 3. Blood culture so far no growth. Patient was hypotensive requiring IV fluid bolus as well as IV pressors, currently off of the pressors since 09/14/2017. Blood pressure getting better . We'll  stop the IV fluid Blood cultures no growth. MRSA PCR recently negative as well. Discontinue vancomycin. We will narrow antibiotics from IV Zosyn to IV ceftriaxone today.  2. Right-sided hydronephrosis. S/P replacement of percutaneous nephrostomy tube. Urine output adequate. Monitor with Foley catheter.  3. DLB CL. Oncology following in the hospital. Currently chemotherapy on hold given suspected sepsis.  4. Severe protein calorie malnutrition. Dietitian consulted. Continue protein supplement.  5. Rheumatoid arthritis. On methotrexate in the past currently not on any medication. Stable.  6. Anemia. Hemoglobin stable after transfusion. Oncology wants to maintain hemoglobin more than 8.  7. Neutropenia. Oncology recommends to continue on G-CSF on a daily basis until [facility] is more than 1500.  8. Constipation. Patient feeling nauseous and has some abdominal pain and is not passing gas. X-ray shows no evidence of obstruction and shows significant constipation. Constipation resolved on its own. We will continue with stools regimen.  9. Acute diastolic CHF. Volume overload. Echocardiogram on 08/30/2017 shows grade 1 diastolic dysfunction. EF 60-65%. Patient aggressively hydrated this admission with 14 L positive. We'll start the patient on IV Lasix. Monitor.  Diet: Cardiac diet DVT Prophylaxis: mechanical compression device  Advance goals of care discussion: full code  Family Communication: no family was present at bedside, at the time of interview.   Disposition:  Discharge to home.  Consultants: onco, PCCM  Procedures: none  Antibiotics: Anti-infectives    Start     Dose/Rate Route Frequency Ordered Stop   09/14/17 1800  vancomycin (VANCOCIN) 1,250 mg in sodium chloride 0.9 % 250 mL IVPB  Status:  Discontinued     1,250 mg 166.7 mL/hr over 90 Minutes Intravenous Every 24 hours 09/13/17 1845 09/15/17 0730   09/12/17 0400  vancomycin (VANCOCIN) 1,250 mg in sodium  chloride  0.9 % 250 mL IVPB  Status:  Discontinued     1,250 mg 166.7 mL/hr over 90 Minutes Intravenous Every 12 hours 09/11/17 0952 09/13/17 1845   09/11/17 1600  piperacillin-tazobactam (ZOSYN) IVPB 3.375 g     3.375 g 12.5 mL/hr over 240 Minutes Intravenous Every 8 hours 09/11/17 0952     09/11/17 0945  vancomycin (VANCOCIN) IVPB 1000 mg/200 mL premix     1,000 mg 200 mL/hr over 60 Minutes Intravenous  Once 09/11/17 0930 09/11/17 1646   09/11/17 0945  piperacillin-tazobactam (ZOSYN) IVPB 3.375 g     3.375 g 100 mL/hr over 30 Minutes Intravenous  Once 09/11/17 0930 09/11/17 1218       Objective: Physical Exam: Vitals:   09/15/17 1100 09/15/17 1200 09/15/17 1300 09/15/17 1400  BP: 140/77 (!) 144/78 (!) 117/59 (!) 106/52  Pulse:      Resp: (!) 30 (!) 21 20 (!) 23  Temp:  (!) 97.5 F (36.4 C)    TempSrc:  Axillary    SpO2: 94% 93% 94% 94%  Weight:      Height:        Intake/Output Summary (Last 24 hours) at 09/15/17 1508 Last data filed at 09/15/17 1400  Gross per 24 hour  Intake             1895 ml  Output             2385 ml  Net             -490 ml   Filed Weights   09/11/17 2200 09/14/17 0500 09/15/17 0500  Weight: 100.6 kg (221 lb 12.5 oz) 109.4 kg (241 lb 2.9 oz) 110.6 kg (243 lb 13.3 oz)   General: Alert, Awake and Oriented to Time, Place and Person. Appear in moderate distress, affect appropriate Eyes: PERRL, Conjunctiva normal ENT: Oral Mucosa clear moist. Neck: difficult to assess JVD, no Abnormal Mass Or lumps Cardiovascular: S1 and S2 Present, no Murmur, Peripheral Pulses Present Respiratory: normal respiratory effort, Bilateral Air entry equal and Decreased, no use of accessory muscle, Clear to Auscultation, no Crackles, no wheezes Abdomen: Bowel Sound present, Soft and no tenderness, no hernia Skin: no redness, no Rash, no induration Extremities: bilateral Pedal edema, no calf tenderness Neurologic: Grossly no focal neuro deficit. Bilaterally Equal motor  strength  Data Reviewed: CBC:  Recent Labs Lab 09/10/17 2319 09/12/17 0530 09/13/17 0415 09/14/17 0303 09/14/17 1930 09/15/17 0500  WBC 1.9* 1.0* 1.1* 1.4*  --  3.2*  NEUTROABS 1.6* 0.7* 0.6* 0.5*  --  0.6*  HGB 6.8* 7.9* 7.7* 7.7* 9.5* 9.6*  HCT 19.7* 22.9* 22.3* 22.8* 28.0* 27.8*  MCV 98.0 93.5 93.7 94.6  --  90.6  PLT 64* 53* 48* 50*  --  52*   Basic Metabolic Panel:  Recent Labs Lab 09/10/17 2319 09/12/17 0530 09/13/17 0415 09/14/17 0303 09/15/17 0500  NA 132* 134* 136 134* 136  K 4.7 4.0 3.5 3.3* 3.0*  CL 102 105 109 108 111  CO2 25 22 22  20* 20*  GLUCOSE 138* 115* 139* 151* 113*  BUN 42* 34* 32* 36* 38*  CREATININE 0.77 0.66 0.71  0.70 0.80  0.82 0.87  CALCIUM 8.4* 8.5* 8.8* 8.4* 8.2*  MG  --  1.4* 1.7 1.7 1.7  PHOS  --  4.9* 4.5 3.9  --     Liver Function Tests:  Recent Labs Lab 09/10/17 2319 09/13/17 0415 09/14/17 0303 09/15/17 0500  AST 25  --  21 20  ALT 25  --  30 30  ALKPHOS 66  --  56 53  BILITOT 1.2  --  1.5* 1.7*  PROT <3.0*  --  <3.0* 3.0*  ALBUMIN 1.4* 1.5* 1.5* 1.6*   No results for input(s): LIPASE, AMYLASE in the last 168 hours. No results for input(s): AMMONIA in the last 168 hours. Coagulation Profile:  Recent Labs Lab 09/11/17 1415 09/15/17 0535  INR 1.17 1.23   Cardiac Enzymes: No results for input(s): CKTOTAL, CKMB, CKMBINDEX, TROPONINI in the last 168 hours. BNP (last 3 results) No results for input(s): PROBNP in the last 8760 hours. CBG: No results for input(s): GLUCAP in the last 168 hours. Studies: Dg Abd Portable 1v  Result Date: 09/15/2017 CLINICAL DATA:  Constipation for 3 days EXAM: PORTABLE ABDOMEN - 1 VIEW COMPARISON:  09/02/2017 CT and prior studies FINDINGS: Nondistended gas-filled loops of small bowel are noted. A moderate amount of stool in the ascending colon and rectum noted. A right percutaneous nephrostomy catheter is again noted. No pneumoperitoneum. IMPRESSION: Nonspecific nonobstructive bowel  gas pattern. Moderate stool in the ascending colon and rectum. Electronically Signed   By: Margarette Canada M.D.   On: 09/15/2017 10:14    Scheduled Meds: . Chlorhexidine Gluconate Cloth  6 each Topical q morning - 10a  . feeding supplement (PRO-STAT SUGAR FREE 64)  30 mL Oral BID  . hydrocortisone sod succinate (SOLU-CORTEF) inj  50 mg Intravenous Q12H  . magic mouthwash w/lidocaine  5 mL Oral QID  . pantoprazole  40 mg Oral Daily  . polyethylene glycol  17 g Oral Daily  . protein supplement shake  2 oz Oral QID  . sodium chloride flush  5 mL Intravenous Q8H  . Tbo-filgastrim (GRANIX) SQ  480 mcg Subcutaneous q1800   Continuous Infusions: . piperacillin-tazobactam (ZOSYN)  IV Stopped (09/15/17 1200)   PRN Meds: ondansetron, oxybutynin, phenol, sodium chloride flush, traMADol  Time spent: 35 minutes  Author: Berle Mull, MD [facility] Pager: [phone] 09/15/2017 3:08 PM  If 7PM-7AM, please contact night-coverage at [email or web address], password [facility]

## 2017-09-16 LAB — CBC WITH DIFFERENTIAL/PLATELET
BASOS ABS: 0 10*3/uL (ref 0.0–0.1)
BLASTS: 0 %
Band Neutrophils: 16 %
Basophils Relative: 0 %
EOS ABS: 0 10*3/uL (ref 0.0–0.7)
Eosinophils Relative: 0 %
HCT: 27.5 % — ABNORMAL LOW (ref 36.0–46.0)
Hemoglobin: 9.3 g/dL — ABNORMAL LOW (ref 12.0–15.0)
LYMPHS PCT: 28 %
Lymphs Abs: 1.8 10*3/uL (ref 0.7–4.0)
MCH: 30.8 pg (ref 26.0–34.0)
MCHC: 33.8 g/dL (ref 30.0–36.0)
MCV: 91.1 fL (ref 78.0–100.0)
MONOS PCT: 16 %
Metamyelocytes Relative: 6 %
Monocytes Absolute: 1 10*3/uL (ref 0.1–1.0)
Myelocytes: 4 %
NEUTROS ABS: 3.5 10*3/uL (ref 1.7–7.7)
NEUTROS PCT: 30 %
NRBC: 0 /100{WBCs}
OTHER: 0 %
PLATELETS: 57 10*3/uL — AB (ref 150–400)
PROMYELOCYTES ABS: 0 %
RBC: 3.02 MIL/uL — AB (ref 3.87–5.11)
RDW: 23.3 % — ABNORMAL HIGH (ref 11.5–15.5)
WBC: 6.3 10*3/uL (ref 4.0–10.5)

## 2017-09-16 LAB — COMPREHENSIVE METABOLIC PANEL
ALT: 32 U/L (ref 14–54)
ANION GAP: 6 (ref 5–15)
AST: 22 U/L (ref 15–41)
Albumin: 1.7 g/dL — ABNORMAL LOW (ref 3.5–5.0)
Alkaline Phosphatase: 59 U/L (ref 38–126)
BUN: 36 mg/dL — ABNORMAL HIGH (ref 6–20)
CHLORIDE: 111 mmol/L (ref 101–111)
CO2: 21 mmol/L — AB (ref 22–32)
CREATININE: 0.9 mg/dL (ref 0.44–1.00)
Calcium: 8 mg/dL — ABNORMAL LOW (ref 8.9–10.3)
GFR calc non Af Amer: >60 mL/min (ref >60)
Glucose, Bld: 118 mg/dL — ABNORMAL HIGH (ref 65–99)
Potassium: 3.2 mmol/L — ABNORMAL LOW (ref 3.5–5.1)
SODIUM: 138 mmol/L (ref 135–145)
Total Bilirubin: 1.7 mg/dL — ABNORMAL HIGH (ref 0.3–1.2)
Total Protein: 3.2 g/dL — ABNORMAL LOW (ref 6.5–8.1)

## 2017-09-16 LAB — MAGNESIUM: Magnesium: 1.6 mg/dL — ABNORMAL LOW (ref 1.7–2.4)

## 2017-09-16 MED ORDER — FUROSEMIDE 10 MG/ML IJ SOLN
20.0000 mg | Freq: Once | INTRAMUSCULAR | Status: AC
  Administered 2017-09-16: 20 mg via INTRAVENOUS
  Filled 2017-09-16: qty 2

## 2017-09-16 MED ORDER — MAGNESIUM SULFATE 2 GM/50ML IV SOLN
2.0000 g | Freq: Once | INTRAVENOUS | Status: AC
  Administered 2017-09-16: 2 g via INTRAVENOUS
  Filled 2017-09-16: qty 50

## 2017-09-16 MED ORDER — POTASSIUM CHLORIDE CRYS ER 20 MEQ PO TBCR
40.0000 meq | EXTENDED_RELEASE_TABLET | Freq: Once | ORAL | Status: AC
  Administered 2017-09-16: 40 meq via ORAL
  Filled 2017-09-16: qty 2

## 2017-09-16 MED ORDER — FUROSEMIDE 40 MG PO TABS: 40.0000 mg | ORAL_TABLET | Freq: Two times a day (BID) | ORAL | Status: DC

## 2017-09-16 MED ORDER — FUROSEMIDE 10 MG/ML IJ SOLN: 40.0000 mg | Freq: Once | INTRAMUSCULAR | Status: DC

## 2017-09-16 NOTE — Evaluation (Addendum)
Physical Therapy Evaluation Patient Details Name: Kristina Torres MRN: [id number] DOB: 1946/10/28 Today's Date: 09/16/2017   History of Present Illness  71 y.o. Female with history of multiple medical problems including rheumatoid arthritis, hyperlipidemia, history of hypertension, and diffuse large B-cell lymphoma. Patient recently hospitalized with right-sided hydronephrosis status post percutaneous nephrostomy tube and exchange on 9/14 after tube malfunction. Patient started on chemotherapy with Cytoxan and vincristine on 9/20 and subsequently discharged to rehabilitation on 9/22. Patient's tube was fractured at her nursing facility and returned to hospital and was found to be hypotensive with borderline blood pressure for at least 2 weeks. PMH: HTN, HLD, gout, RA  Clinical Impression  Pt admitted with above diagnosis. Pt currently with functional limitations due to the deficits listed below (see PT Problem List).  Pt will benefit from skilled PT to increase their independence and safety with mobility to allow discharge to the venue listed below.  Pt is extremely weak with +3 pitting edema in LE.  Educated on LE therex she can perform in bed.  She will need +2 for any OOB mobility attempts.  Recommend SNF at this time and pt and husband are in agreement.     Follow Up Recommendations SNF    Equipment Recommendations  None recommended by PT    Recommendations for Other Services       Precautions / Restrictions Precautions Precautions: Fall Precaution Comments: nephrostomy tube, Port Restrictions Weight Bearing Restrictions: No      Mobility  Bed Mobility Overal bed mobility: Needs Assistance Bed Mobility: Rolling Rolling: Max assist         General bed mobility comments: Pt can bring UE across to grab rail, but requires A to complete roll. Pt declined EOB attempt.  Transfers                    Ambulation/Gait                Stairs             Wheelchair Mobility    Modified Rankin (Stroke Patients Only)       Balance                                             Pertinent Vitals/Pain Pain Assessment: 0-10 Pain Score: 4  Pain Location: legs Pain Descriptors / Indicators: Cramping Pain Intervention(s): Monitored during session    Home Living Family/patient expects to be discharged to:: Skilled nursing facility                      Prior Function Level of Independence: Independent         Comments: Independent until about 3 weeks ago and has declined with ambulation since then     Hand Dominance        Extremity/Trunk Assessment   Upper Extremity Assessment Upper Extremity Assessment: Generalized weakness    Lower Extremity Assessment Lower Extremity Assessment: LLE deficits/detail;RLE deficits/detail RLE Deficits / Details: Good quad set, but 2/5 strength, 3+ pitting edema in LE LLE Deficits / Details: Good quad set, but 2/5 strength, 3+ pitting edema in LE       Communication   Communication: No difficulties  Cognition Arousal/Alertness: Awake/alert Behavior During Therapy: WFL for tasks assessed/performed Overall Cognitive Status: Within Functional Limits for tasks assessed  General Comments General comments (skin integrity, edema, etc.): Pt wheezing throughout session o2 92% on room air    Exercises General Exercises - Lower Extremity Ankle Circles/Pumps: AROM;Both;10 reps Quad Sets: AROM;Both;10 reps Gluteal Sets: AROM;Both;10 reps Heel Slides: AAROM;Both;10 reps Hip ABduction/ADduction: AAROM;Both;10 reps   Assessment/Plan    PT Assessment Patient needs continued PT services  PT Problem List Decreased strength;Decreased activity tolerance;Decreased mobility       PT Treatment Interventions DME instruction;Functional mobility training;Gait training;Therapeutic activities;Therapeutic exercise     PT Goals (Current goals can be found in the Care Plan section)  Acute Rehab PT Goals Patient Stated Goal: to get stronger PT Goal Formulation: With patient Time For Goal Achievement: 09/30/17 Potential to Achieve Goals: Fair    Frequency Min 3X/week   Barriers to discharge        Co-evaluation               AM-PAC PT "6 Clicks" Daily Activity  Outcome Measure Difficulty turning over in bed (including adjusting bedclothes, sheets and blankets)?: Unable Difficulty moving from lying on back to sitting on the side of the bed? : Unable Difficulty sitting down on and standing up from a chair with arms (e.g., wheelchair, bedside commode, etc,.)?: Unable Help needed moving to and from a bed to chair (including a wheelchair)?: Total Help needed walking in hospital room?: Total Help needed climbing 3-5 steps with a railing? : Total 6 Click Score: 6    End of Session   Activity Tolerance: Patient limited by fatigue Patient left: in bed;with call bell/phone within reach   PT Visit Diagnosis: Muscle weakness (generalized) (M62.81);Other abnormalities of gait and mobility (R26.89)    Time: 1751-0258 PT Time Calculation (min) (ACUTE ONLY): 24 min   Charges:   PT Evaluation $PT Eval Moderate Complexity: 1 Mod PT Treatments $Therapeutic Exercise: 8-22 mins   PT G Codes:        Xaiden Fleig L. Tamala Julian, [place] Pager [phone] 09/16/2017   Galen Manila 09/16/2017, 3:51 PM

## 2017-09-16 NOTE — Progress Notes (Signed)
Ms. Capri is very nice. She is very talkative. She feels good. She now is out of the ICU. Her white cell count is 6.3. Her potassium is slightly low. Her hemoglobin is 9.3. Her platelet count is still low at 57,000. Hopefully, now that she is off Neupogen, her platelet count will come up more quickly.  She has a nephrostomy tube in her right kidney. This has relatively clear urine.  Her cultures have been negative. She is on Rocephin.  Her appetite is doing okay. There is a little nausea but no vomiting.  She's had no bleeding. She has a lot of swelling in her feet. I think she probably has had a lot of IV fluid.  She is on hydrocortisone. Maybe, this could be slowly tapered off.  On her physical exam, her vital signs are relatively stable. Her blood pressure is 102/57. Pulse is 77. Temperature or 97.7. Her oral exam shows no thrush. There is no adenopathy in her neck. Lungs are clear. Cardiac exam regular rate and rhythm. Abdomen is soft. She is somewhat obese. There is no guarding or rebound tenderness. There is no palpable liver or spleen tip. Extremities shows 2+ edema in her legs.  Mrs. Ibe is a 71 year old female with large cell non-Hodgkin's lymphoma. She's had chemotherapy with R-CVP. Hopefully, she will be able to have Adriamycin added with her next cycle.  She does look pretty good to me. Hopefully, she will continue to improve.  It was fun talking to her. Her husband is from [place]. We talked a lot about [place].  Lattie Haw, MD  1 Corinthians 13:13

## 2017-09-16 NOTE — Progress Notes (Signed)
[facility] Progress Note  Patient: Kristina Torres OFB:[id number]   PCP: Kristina Kiel, MD DOB: 1946/05/11   DOA: 09/10/2017   DOS: 09/16/2017   Date of Service: the patient was seen and examined on 09/16/2017  Subjective: Feeling better, nausea resolved. Abdominal pain resolved. Constipation resolved. Still has significant swelling of her leg.  Brief hospital course: Pt. with PMH of DLBCL, RA, HLD, HTN; admitted on 09/10/2017, presented with complaint of low blood pressure, was found to have suspected sepsis. Currently further plan is continue current plan. 71 y.o. Female with history of multiple medical problems including rheumatoid arthritis, hyperlipidemia, history of hypertension, and diffuse large B-cell lymphoma. Patient recently hospitalized with right-sided hydronephrosis status post percutaneous nephrostomy tube and exchange on 9/14 after tube malfunction. Patient started on chemotherapy with Cytoxan and vincristine on 9/20 and subsequently discharged to rehabilitation on 9/22. Patient's tube was fractured at her nursing facility and patient was subsequently transferred to our facility for evaluation. Upon arrival the patient was noted to be hypotensive with borderline blood pressure going back for at least 2 weeks. Patient has notable anasarca but denies any chest pain or pressure. She denies any dyspnea or cough. No subjective fever or chills. Bolus IV fluid and blood products were administered in the emergency department and she was subsequently started on vasopressor support.  Assessment and Plan: 1. Hypotension, possible septic shock. Patient was started on empiric vancomycin and Zosyn. Currently day 3. Blood culture so far no growth. Patient was hypotensive requiring IV fluid bolus as well as IV pressors, currently off of the pressors since 09/14/2017. Blood pressure getting better . We'll stop the IV fluid Blood cultures no growth. MRSA PCR recently negative as well.  Discontinue vancomycin. Patient received 5 days of IV antibiotics with vancomycin and Zosyn, will discontinue antibiotics from today. No evidence of active infection anywhere.  2. Right-sided hydronephrosis. S/P replacement of percutaneous nephrostomy tube. Urine output adequate. Monitor with Foley catheter.  3. DLB CL. Oncology following in the hospital. Currently chemotherapy on hold given suspected sepsis.  4. Severe protein calorie malnutrition. Dietitian consulted. Continue protein supplement.  5. Rheumatoid arthritis. On methotrexate in the past currently not on any medication. Stable.  6. Anemia. Hemoglobin stable after transfusion. Oncology wants to maintain hemoglobin more than 8.  7. Neutropenia. Oncology recommends to continue on G-CSF on a daily basis until [place] is more than 1500. 09/16/2017 ANC is 3600. Discontinue G-CSF.  8. Constipation. Patient feeling nauseous and has some abdominal pain and is not passing gas. X-ray shows no evidence of obstruction and shows significant constipation. Constipation resolved on its own. We will continue with stools regimen.  9. Acute diastolic CHF. Volume overload. Echocardiogram on 08/30/2017 shows grade 1 diastolic dysfunction. EF 60-65%. Patient aggressively hydrated this admission with 14 L positive. We'll start the patient on IV Lasix. Monitor.  Diet: Cardiac diet DVT Prophylaxis: mechanical compression device  Advance goals of care discussion: full code  Family Communication: no family was present at bedside, at the time of interview.   Disposition:  Discharge to home.  Consultants: onco, PCCM  Procedures: none  Antibiotics: Anti-infectives    Start     Dose/Rate Route Frequency Ordered Stop   09/15/17 1600  cefTRIAXone (ROCEPHIN) 2 g in dextrose 5 % 50 mL IVPB     2 g 100 mL/hr over 30 Minutes Intravenous Every 24 hours 09/15/17 1522     09/14/17 1800  vancomycin (VANCOCIN) 1,250 mg in sodium chloride  0.9 % 250 mL  IVPB  Status:  Discontinued     1,250 mg 166.7 mL/hr over 90 Minutes Intravenous Every 24 hours 09/13/17 1845 09/15/17 0730   09/12/17 0400  vancomycin (VANCOCIN) 1,250 mg in sodium chloride 0.9 % 250 mL IVPB  Status:  Discontinued     1,250 mg 166.7 mL/hr over 90 Minutes Intravenous Every 12 hours 09/11/17 0952 09/13/17 1845   09/11/17 1600  piperacillin-tazobactam (ZOSYN) IVPB 3.375 g  Status:  Discontinued     3.375 g 12.5 mL/hr over 240 Minutes Intravenous Every 8 hours 09/11/17 0952 09/15/17 1521   09/11/17 0945  vancomycin (VANCOCIN) IVPB 1000 mg/200 mL premix     1,000 mg 200 mL/hr over 60 Minutes Intravenous  Once 09/11/17 0930 09/11/17 1646   09/11/17 0945  piperacillin-tazobactam (ZOSYN) IVPB 3.375 g     3.375 g 100 mL/hr over 30 Minutes Intravenous  Once 09/11/17 0930 09/11/17 1218       Objective: Physical Exam: Vitals:   09/16/17 0335 09/16/17 0500 09/16/17 1230 09/16/17 1525  BP: (!) 102/57  (!) 109/59   Pulse: 77  92   Resp: 18  19   Temp: 97.7 F (36.5 C)  98.1 F (36.7 C)   TempSrc: Oral  Oral   SpO2: 95%  93% 92%  Weight:  112.6 kg (248 lb 3.8 oz)    Height:        Intake/Output Summary (Last 24 hours) at 09/16/17 1620 Last data filed at 09/16/17 1219  Gross per 24 hour  Intake              520 ml  Output             1002 ml  Net             -482 ml   Filed Weights   09/14/17 0500 09/15/17 0500 09/16/17 0500  Weight: 109.4 kg (241 lb 2.9 oz) 110.6 kg (243 lb 13.3 oz) 112.6 kg (248 lb 3.8 oz)   General: Alert, Awake and Oriented to Time, Place and Person. Appear in moderate distress, affect appropriate Eyes: PERRL, Conjunctiva normal ENT: Oral Mucosa clear moist. Neck: difficult to assess JVD, no Abnormal Mass Or lumps Cardiovascular: S1 and S2 Present, no Murmur, Peripheral Pulses Present Respiratory: normal respiratory effort, Bilateral Air entry equal and Decreased, no use of accessory muscle, Clear to Auscultation, no Crackles, no  wheezes Abdomen: Bowel Sound present, Soft and no tenderness, no hernia Skin: no redness, no Rash, no induration Extremities: bilateral Pedal edema, no calf tenderness Neurologic: Grossly no focal neuro deficit. Bilaterally Equal motor strength  Data Reviewed: CBC:  Recent Labs Lab 09/12/17 0530 09/13/17 0415 09/14/17 0303 09/14/17 1930 09/15/17 0500 09/16/17 0431  WBC 1.0* 1.1* 1.4*  --  3.2* 6.3  NEUTROABS 0.7* 0.6* 0.5*  --  0.6* 3.5  HGB 7.9* 7.7* 7.7* 9.5* 9.6* 9.3*  HCT 22.9* 22.3* 22.8* 28.0* 27.8* 27.5*  MCV 93.5 93.7 94.6  --  90.6 91.1  PLT 53* 48* 50*  --  52* 57*   Basic Metabolic Panel:  Recent Labs Lab 09/12/17 0530 09/13/17 0415 09/14/17 0303 09/15/17 0500 09/16/17 0431  NA 134* 136 134* 136 138  K 4.0 3.5 3.3* 3.0* 3.2*  CL 105 109 108 111 111  CO2 22 22 20* 20* 21*  GLUCOSE 115* 139* 151* 113* 118*  BUN 34* 32* 36* 38* 36*  CREATININE 0.66 0.71  0.70 0.80  0.82 0.87 0.90  CALCIUM 8.5* 8.8* 8.4* 8.2* 8.0*  MG  1.4* 1.7 1.7 1.7 1.6*  PHOS 4.9* 4.5 3.9  --   --     Liver Function Tests:  Recent Labs Lab 09/10/17 2319 09/13/17 0415 09/14/17 0303 09/15/17 0500 09/16/17 0431  AST 25  --  21 20 22   ALT 25  --  30 30 32  ALKPHOS 66  --  56 53 59  BILITOT 1.2  --  1.5* 1.7* 1.7*  PROT <3.0*  --  <3.0* 3.0* 3.2*  ALBUMIN 1.4* 1.5* 1.5* 1.6* 1.7*   No results for input(s): LIPASE, AMYLASE in the last 168 hours. No results for input(s): AMMONIA in the last 168 hours. Coagulation Profile:  Recent Labs Lab 09/11/17 1415 09/15/17 0535  INR 1.17 1.23   Cardiac Enzymes: No results for input(s): CKTOTAL, CKMB, CKMBINDEX, TROPONINI in the last 168 hours. BNP (last 3 results) No results for input(s): PROBNP in the last 8760 hours. CBG: No results for input(s): GLUCAP in the last 168 hours. Studies: No results found.  Scheduled Meds: . Chlorhexidine Gluconate Cloth  6 each Topical q morning - 10a  . feeding supplement (PRO-STAT SUGAR FREE  64)  30 mL Oral BID  . hydrocortisone sod succinate (SOLU-CORTEF) inj  50 mg Intravenous Q12H  . magic mouthwash w/lidocaine  5 mL Oral QID  . pantoprazole  40 mg Oral Daily  . polyethylene glycol  17 g Oral Daily  . protein supplement shake  2 oz Oral QID  . sodium chloride flush  5 mL Intravenous Q8H   Continuous Infusions: . cefTRIAXone (ROCEPHIN)  IV Stopped (09/15/17 1612)   PRN Meds: ondansetron, oxybutynin, phenol, sodium chloride flush, traMADol  Time spent: 35 minutes  Author: Berle Mull, MD [facility] Pager: [phone] 09/16/2017 4:20 PM  If 7PM-7AM, please contact night-coverage at [email or web address], password [facility]

## 2017-09-17 LAB — BASIC METABOLIC PANEL
Anion gap: 7 (ref 5–15)
BUN: 39 mg/dL — AB (ref 6–20)
CHLORIDE: 108 mmol/L (ref 101–111)
CO2: 21 mmol/L — ABNORMAL LOW (ref 22–32)
CREATININE: 0.86 mg/dL (ref 0.44–1.00)
Calcium: 7.7 mg/dL — ABNORMAL LOW (ref 8.9–10.3)
GFR calc Af Amer: >60 mL/min (ref >60)
GLUCOSE: 148 mg/dL — AB (ref 65–99)
POTASSIUM: 3.1 mmol/L — AB (ref 3.5–5.1)
Sodium: 136 mmol/L (ref 135–145)

## 2017-09-17 LAB — CBC WITH DIFFERENTIAL/PLATELET
Basophils Absolute: 0.1 10*3/uL (ref 0.0–0.1)
Basophils Relative: 1 %
Eosinophils Absolute: 0 10*3/uL (ref 0.0–0.7)
Eosinophils Relative: 0 %
HEMATOCRIT: 28.5 % — AB (ref 36.0–46.0)
HEMOGLOBIN: 9.7 g/dL — AB (ref 12.0–15.0)
LYMPHS PCT: 24 %
Lymphs Abs: 3.6 10*3/uL (ref 0.7–4.0)
MCH: 30.7 pg (ref 26.0–34.0)
MCHC: 34 g/dL (ref 30.0–36.0)
MCV: 90.2 fL (ref 78.0–100.0)
MONO ABS: 1.7 10*3/uL — AB (ref 0.1–1.0)
MONOS PCT: 11 %
NEUTROS ABS: 9.4 10*3/uL — AB (ref 1.7–7.7)
Neutrophils Relative %: 64 %
Platelets: 63 10*3/uL — ABNORMAL LOW (ref 150–400)
RBC: 3.16 MIL/uL — ABNORMAL LOW (ref 3.87–5.11)
RDW: 23.1 % — ABNORMAL HIGH (ref 11.5–15.5)
WBC: 14.7 10*3/uL — ABNORMAL HIGH (ref 4.0–10.5)

## 2017-09-17 LAB — MAGNESIUM: Magnesium: 1.8 mg/dL (ref 1.7–2.4)

## 2017-09-17 LAB — CULTURE, BLOOD (ROUTINE X 2)
CULTURE: NO GROWTH
Culture: NO GROWTH
SPECIAL REQUESTS: ADEQUATE
SPECIAL REQUESTS: ADEQUATE

## 2017-09-17 MED ORDER — POTASSIUM CHLORIDE CRYS ER 20 MEQ PO TBCR
40.0000 meq | EXTENDED_RELEASE_TABLET | Freq: Once | ORAL | Status: AC
  Administered 2017-09-17: 40 meq via ORAL
  Filled 2017-09-17: qty 2

## 2017-09-17 MED ORDER — FUROSEMIDE 10 MG/ML IJ SOLN
40.0000 mg | Freq: Two times a day (BID) | INTRAMUSCULAR | Status: DC
  Administered 2017-09-17 – 2017-09-18 (×2): 40 mg via INTRAVENOUS
  Filled 2017-09-17 (×2): qty 4

## 2017-09-17 MED ORDER — POTASSIUM CHLORIDE CRYS ER 20 MEQ PO TBCR: 40.0000 meq | EXTENDED_RELEASE_TABLET | Freq: Once | ORAL | Status: DC

## 2017-09-17 MED ORDER — POTASSIUM CHLORIDE 20 MEQ PO PACK: 40.0000 meq | PACK | Freq: Once | ORAL | Status: DC

## 2017-09-17 MED ORDER — ALBUTEROL SULFATE (2.5 MG/3ML) 0.083% IN NEBU
3.0000 mL | INHALATION_SOLUTION | Freq: Four times a day (QID) | RESPIRATORY_TRACT | Status: AC
  Administered 2017-09-17 – 2017-09-18 (×4): 3 mL via RESPIRATORY_TRACT
  Filled 2017-09-17 (×4): qty 3

## 2017-09-17 MED ORDER — FUROSEMIDE 10 MG/ML IJ SOLN
40.0000 mg | Freq: Once | INTRAMUSCULAR | Status: AC
  Administered 2017-09-17: 40 mg via INTRAVENOUS
  Filled 2017-09-17: qty 4

## 2017-09-17 MED ORDER — HYDROCORTISONE NA SUCCINATE PF 100 MG IJ SOLR
50.0000 mg | Freq: Every day | INTRAMUSCULAR | Status: DC
  Administered 2017-09-18 – 2017-09-24 (×7): 50 mg via INTRAVENOUS
  Filled 2017-09-17 (×7): qty 2

## 2017-09-17 MED ORDER — ALBUMIN HUMAN 25 % IV SOLN
12.5000 g | Freq: Once | INTRAVENOUS | Status: AC
  Administered 2017-09-17: 12.5 g via INTRAVENOUS
  Filled 2017-09-17: qty 50

## 2017-09-17 MED ORDER — ALBUTEROL SULFATE (2.5 MG/3ML) 0.083% IN NEBU
INHALATION_SOLUTION | RESPIRATORY_TRACT | Status: AC
  Administered 2017-09-18: 3 mL via RESPIRATORY_TRACT
  Filled 2017-09-17: qty 3

## 2017-09-17 NOTE — Progress Notes (Signed)
X.Blount notified of patient complaint of shortness of breath and wheezing.  Kristina Torres stated she will come to bedside to assess patient and address patient's concerns.

## 2017-09-17 NOTE — NC FL2 (Signed)
[place] LEVEL OF CARE SCREENING TOOL     IDENTIFICATION  Patient Name: Kristina Torres Birthdate: 1946-11-11 Sex: female Admission Date (Current Location): 09/10/2017  [facility] [place] Number:  Herbalist and Address:  [facility],  [address], [place]      Provider Number: [id number]  Attending Physician Name and Address:  [name], MD  Relative Name and Phone Number:  Forest Becker; [phone] (h), [phone] (mobile)    Current Level of Care: Hospital Recommended Level of Care: [place] Prior Approval Number:    Date Approved/Denied:   PASRR Number: [id number]  Discharge Plan: SNF    Current Diagnoses: Patient Active Problem List   Diagnosis Date Noted  . Antineoplastic chemotherapy induced pancytopenia ([place])   . DLBCL (diffuse large B cell lymphoma) ([place]) 09/03/2017  . Cytopenia   . Hypophosphatemia 08/29/2017  . Hypomagnesemia 08/29/2017  . Hyponatremia 08/29/2017  . Acute urinary retention 08/29/2017  . Hyperbilirubinemia 08/29/2017  . Macrocytic anemia 08/21/2017  . Lymphadenopathy   . UTI (urinary tract infection) 08/20/2017  . Sepsis ([place]) 08/20/2017  . Hypotension 08/20/2017  . AKI (acute kidney injury) ([place]) 08/20/2017  . Nausea & vomiting 08/20/2017  . Hypercalcemia 08/20/2017  . Thrombocytopenia ([place]) 08/20/2017  . Cough 08/20/2017  . Rheumatoid arthritis ([place])   . Essential hypertension   . Gout   . HLD (hyperlipidemia)     Orientation RESPIRATION BLADDER Height & Weight     Self, Time, Situation, Place  Normal Urostomy, Indwelling catheter Weight: 248 lb 3.8 oz (112.6 kg) Height:  5' (152.4 cm)  BEHAVIORAL SYMPTOMS/MOOD NEUROLOGICAL BOWEL NUTRITION STATUS      Incontinent Diet (2 gram sodium, thin fluid consistency)  AMBULATORY STATUS COMMUNICATION OF NEEDS Skin   Extensive Assist Verbally Normal                       Personal Care  Assistance Level of Assistance  Bathing, Feeding, Dressing Bathing Assistance: Maximum assistance Feeding assistance: Independent Dressing Assistance: Maximum assistance     Functional Limitations Info  Sight, Hearing, Speech Sight Info: Adequate Hearing Info: Adequate Speech Info: Adequate    SPECIAL CARE FACTORS FREQUENCY  PT (By licensed PT), OT (By licensed OT)     PT Frequency: 5x OT Frequency: 5x            Contractures Contractures Info: Not present    Additional Factors Info  Code Status, Allergies Code Status Info: full code Allergies Info: streptomycin           Current Medications (09/17/2017):  This is the current hospital active medication list Current Facility-Administered Medications  Medication Dose Route Frequency Provider Last Rate Last Dose  . Chlorhexidine Gluconate Cloth 2 % PADS 6 each  6 each Topical q morning - 10a Javier Glazier, MD   6 each at 09/17/17 0900  . feeding supplement (PRO-STAT SUGAR FREE 64) liquid 30 mL  30 mL Oral BID [name], MD   30 mL at 09/15/17 2200  . hydrocortisone sodium succinate (SOLU-CORTEF) 100 MG injection 50 mg  50 mg Intravenous Q12H [name], MD   50 mg at 09/17/17 0506  . magic mouthwash w/lidocaine  5 mL Oral QID [name], MD   5 mL at 09/15/17 1400  . ondansetron (ZOFRAN-ODT) disintegrating tablet 4 mg  4 mg Oral PRN Erick Colace, NP      . oxybutynin (  DITROPAN) tablet 5 mg  5 mg Oral Q8H PRN [name], MD      . pantoprazole (PROTONIX) EC tablet 40 mg  40 mg Oral Daily Erick Colace, NP   40 mg at 09/16/17 0837  . phenol (CHLORASEPTIC) mouth spray 1 spray  1 spray Mouth/Throat PRN Wilhelmina Mcardle, MD      . polyethylene glycol (MIRALAX / GLYCOLAX) packet 17 g  17 g Oral Daily [name], MD      . protein supplement (PREMIER PROTEIN) liquid  2 oz Oral QID Javier Glazier, MD   2 oz at 09/17/17 0900  . sodium chloride flush (NS) 0.9 % injection 10-40 mL  10-40 mL  Intracatheter PRN Javier Glazier, MD      . sodium chloride flush (NS) 0.9 % injection 5 mL  5 mL Intravenous Lovena Neighbours, MD   5 mL at 09/16/17 1550  . traMADol (ULTRAM) tablet 50 mg  50 mg Oral Q6H PRN Wilhelmina Mcardle, MD   50 mg at 09/15/17 1049     Discharge Medications: Please see discharge summary for a list of discharge medications.  Relevant Imaging Results:  Relevant Lab Results:   Additional Information SS#: [id number]  Nila Nephew, LCSW

## 2017-09-17 NOTE — Progress Notes (Signed)
CSW following for disposition. Pt admitted from facility- [facility]- where she had been for 3 days for short term rehab prior to this admission.  At this time plans are to return to rehab, however per husband, they would like to pursue [facility] rather than [facility]- "We felt like her nephrostomy tube was not well cared for at [facility] near pt's home. CSW completed FL2- made referral in order to investigate if [facility] able to accommodate pt, and will follow up if not in order to make other plans with pt/family.  Sharren Bridge, MSW, LCSW Clinical Social Work 09/17/2017 [phone]

## 2017-09-17 NOTE — Progress Notes (Signed)
Kristina Torres   DOB:[birth year]   ZT#:[id number]   JAS#:[id number]  Hematology and Oncology service follow up note   Subjective: Pt is out of ICU, VS stable, facial and arm edema has improved, still has significant LE edema, legs are wrapped. Appetite low, but eats small meals and protein shakes. She has developed some dyspnea lately, no fever.   Objective:  Vitals:   09/17/17 2117 09/17/17 2240  BP:  (!) 91/54  Pulse:  (!) 109  Resp:  20  Temp:  98 F (36.7 C)  SpO2: 93% 93%    Body mass index is 51.88 kg/m.  Intake/Output Summary (Last 24 hours) at 09/17/17 2243 Last data filed at 09/17/17 2008  Gross per 24 hour  Intake             1020 ml  Output             1925 ml  Net             -905 ml     Sclerae unicteric  Oropharynx clear  No peripheral adenopathy In her neck, supraclavicular, axilla or groin area  Lungs clear -- no rales or rhonchi  Heart regular rate and rhythm  Abdomen benign  MSK: Diffuse anasarca, ecchymosis on her arms  Neuro nonfocal, somnolent    CBG (last 3)  No results for input(s): GLUCAP in the last 72 hours.   Labs:  Urine Studies No results for input(s): UHGB, CRYS in the last 72 hours.  Invalid input(s): UACOL, UAPR, USPG, UPH, UTP, UGL, UKET, UBIL, UNIT, UROB, [place], UEPI, UWBC, [place], [place], [place], [place], [place]  Basic Metabolic Panel:  Recent Labs Lab 09/12/17 0530 09/13/17 0415 09/14/17 0303 09/15/17 0500 09/16/17 0431 09/17/17 0030  NA 134* 136 134* 136 138 136  K 4.0 3.5 3.3* 3.0* 3.2* 3.1*  CL 105 109 108 111 111 108  CO2 22 22 20* 20* 21* 21*  GLUCOSE 115* 139* 151* 113* 118* 148*  BUN 34* 32* 36* 38* 36* 39*  CREATININE 0.66 0.71  0.70 0.80  0.82 0.87 0.90 0.86  CALCIUM 8.5* 8.8* 8.4* 8.2* 8.0* 7.7*  MG 1.4* 1.7 1.7 1.7 1.6* 1.8  PHOS 4.9* 4.5 3.9  --   --   --    GFR Estimated Creatinine Clearance: 68.5 mL/min (by C-G formula based on SCr of 0.86 mg/dL). Liver Function Tests:  Recent Labs Lab 09/10/17 2319  09/13/17 0415 09/14/17 0303 09/15/17 0500 09/16/17 0431  AST 25  --  21 20 22   ALT 25  --  30 30 32  ALKPHOS 66  --  56 53 59  BILITOT 1.2  --  1.5* 1.7* 1.7*  PROT <3.0*  --  <3.0* 3.0* 3.2*  ALBUMIN 1.4* 1.5* 1.5* 1.6* 1.7*   No results for input(s): LIPASE, AMYLASE in the last 168 hours. No results for input(s): AMMONIA in the last 168 hours. Coagulation profile  Recent Labs Lab 09/11/17 1415 09/15/17 0535  INR 1.17 1.23    CBC:  Recent Labs Lab 09/13/17 0415 09/14/17 0303 09/14/17 1930 09/15/17 0500 09/16/17 0431 09/17/17 0030  WBC 1.1* 1.4*  --  3.2* 6.3 14.7*  NEUTROABS 0.6* 0.5*  --  0.6* 3.5 9.4*  HGB 7.7* 7.7* 9.5* 9.6* 9.3* 9.7*  HCT 22.3* 22.8* 28.0* 27.8* 27.5* 28.5*  MCV 93.7 94.6  --  90.6 91.1 90.2  PLT 48* 50*  --  52* 57* 63*   Cardiac Enzymes: No results for input(s): CKTOTAL, CKMB, CKMBINDEX, TROPONINI  in the last 168 hours. BNP: Invalid input(s): POCBNP CBG: No results for input(s): GLUCAP in the last 168 hours. D-Dimer No results for input(s): DDIMER in the last 72 hours. Hgb A1c No results for input(s): HGBA1C in the last 72 hours. Lipid Profile No results for input(s): CHOL, HDL, LDLCALC, TRIG, CHOLHDL, LDLDIRECT in the last 72 hours. Thyroid function studies No results for input(s): TSH, T4TOTAL, T3FREE, THYROIDAB in the last 72 hours.  Invalid input(s): FREET3 Anemia work up No results for input(s): VITAMINB12, FOLATE, FERRITIN, TIBC, IRON, RETICCTPCT in the last 72 hours. Microbiology Recent Results (from the past 240 hour(s))  MRSA PCR Screening     Status: None   Collection Time: 09/11/17  1:29 PM  Result Value Ref Range Status   MRSA by PCR NEGATIVE NEGATIVE Final    Comment:        The GeneXpert MRSA Assay (FDA approved for NASAL specimens only), is one component of a comprehensive MRSA colonization surveillance program. It is not intended to diagnose MRSA infection nor to guide or monitor treatment for MRSA  infections.   Urine culture     Status: None   Collection Time: 09/11/17  4:50 PM  Result Value Ref Range Status   Specimen Description URINE, CATHETERIZED  Final   Special Requests NONE  Final   Culture   Final    NO GROWTH Performed at [facility], [address] [place], [place] [address]    Report Status 09/12/2017 FINAL  Final  Culture, blood (routine x 2)     Status: None   Collection Time: 09/11/17 11:35 PM  Result Value Ref Range Status   Specimen Description BLOOD LEFT HAND  Final   Special Requests IN PEDIATRIC BOTTLE Blood Culture adequate volume  Final   Culture   Final    NO GROWTH 5 DAYS Performed at [facility], [place] [address] [place], [place] [address]    Report Status 09/17/2017 FINAL  Final  Culture, blood (routine x 2)     Status: None   Collection Time: 09/11/17 11:35 PM  Result Value Ref Range Status   Specimen Description BLOOD RIGHT HAND  Final   Special Requests IN PEDIATRIC BOTTLE Blood Culture adequate volume  Final   Culture   Final    NO GROWTH 5 DAYS Performed at [facility], [place] [address], [place], [place] [address]    Report Status 09/17/2017 FINAL  Final    Pathology report  Diagnosis 08/31/2017 Bone Marrow, Aspirate,Biopsy, and Clot, left iliac - HYPERCELLULAR BONE MARROW WITH INVOLVEMENT BY A B-CELL LYMPHOPROLIFERATIVE DISORDER. - SEE COMMENT. PERIPHERAL BLOOD: - NORMOCYTIC-NORMOCHROMIC ANEMIA. - CIRCULATING ATYPICAL LYMPHOID CELLS. - THROMBOCYTOPENIA. Diagnosis Note The bone marrow is prominently involved by a B-cell lymphoproliferative process with morphologic and phenotypic features of large B-cell lymphoma. 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 While a significant proportion of patients with this disorder show at least partial regression in response to drug withdrawal, most require  cytotoxic therapy. Clinical and cytogenetic correlation is recommended. (BNS:ecj 09/03/2017).  Studies:  No results found.  Assessment: Kristina Torres is a 71 y.o. female with a history of HTN, gout, HLD and Rheumatoid Arthritis on MTX, presented with severe right hydronephrosis, fatigue, nausea,.and weakness    1. Hypotension, sepsis vs Adrenal insufficiency, resolved now  2. Diffuse large B cell lymphoma, with bone marrow involvement, stage IV, IPS 4, high risk, s/p  first cycle chemo R-CVP on 09/06/2017 2. Pancytopenia, secondary to #2 and chemo  3. Severe anasarca and hypoalbuminemia  4. RA, MTX on hold for now  5. HTN 6. Right hydronephrosis secondary to ureteral obstruction, status post nephrostomy tube placement 7. History of severe hypercalcemia, resolved  8. Nausea and vomiting, anorexia  9. Severe calorie and protein malnutrition  10 dyspnea, ? Pulmonary edema again  Plan:  -her ID work up has been negative, iv antibiotics stopped today (she received for 5 days) -overall improved, but now developed dyspnea. She received IVF in ICU for hypotension, I would get a CXR to rule out pulmonary edema (she had on last admission) and consider diuretics -she needs to improve her nutrition status and protein level, I agree with iv albumin, may consider TPN, but fluids load and risk of infection may be concerning issues  -neutropenia resolved now, continue supportive care -I encourage her to get out of bed, and do PT/OT  -will continue f/u    Truitt Merle, MD 09/17/2017

## 2017-09-17 NOTE — Plan of Care (Signed)
Problem: Skin Integrity: Goal: Risk for impaired skin integrity will decrease Outcome: Progressing Pt declines to be turned and repositioned despite teaching this AM.  Will continue to encourage repositioning.  Problem: Fluid Volume: Goal: Ability to maintain a balanced intake and output will improve Outcome: Progressing Pt w/ anasarca.  Receiving IV lasix/albumin.   Problem: Nutrition: Goal: Adequate nutrition will be maintained Outcome: Progressing Encourage protein supplements and PO intake.

## 2017-09-17 NOTE — Progress Notes (Signed)
[facility] Progress Note  Patient: Kristina Torres ONG:[id number]   PCP: [name], MD DOB: 1946-04-11   DOA: 09/10/2017   DOS: 09/17/2017   Date of Service: the patient was seen and examined on 09/17/2017  Subjective: Since last 2 The patient has been complaining of having shortness of breath at night. No cough no chest pain at the time of my evaluation. Breathing is stable as well.  Brief hospital course: Pt. with PMH of DLBCL, RA, HLD, HTN; admitted on 09/10/2017, presented with complaint of low blood pressure, was found to have suspected sepsis. Currently further plan is continue current plan. 71 y.o. Female with history of multiple medical problems including rheumatoid arthritis, hyperlipidemia, history of hypertension, and diffuse large B-cell lymphoma. Patient recently hospitalized with right-sided hydronephrosis status post percutaneous nephrostomy tube and exchange on 9/14 after tube malfunction. Patient started on chemotherapy with Cytoxan and vincristine on 9/20 and subsequently discharged to rehabilitation on 9/22. Patient's tube was fractured at her nursing facility and patient was subsequently transferred to our facility for evaluation. Upon arrival the patient was noted to be hypotensive with borderline blood pressure going back for at least 2 weeks. Patient has notable anasarca but denies any chest pain or pressure. She denies any dyspnea or cough. No subjective fever or chills. Bolus IV fluid and blood products were administered in the emergency department and she was subsequently started on vasopressor support.  Assessment and Plan: 1. Hypotension, distributive shock. Patient was started on empiric vancomycin and Zosyn. Currently day 3. Blood culture so far no growth. Patient was hypotensive requiring IV fluid bolus as well as IV pressors, currently off of the pressors since 09/14/2017. Blood pressure getting better . We'll stop the IV fluid Blood cultures no growth.  MRSA PCR recently negative as well. Discontinue vancomycin. Patient received 5 days of IV antibiotics with vancomycin and Zosyn, will discontinue antibiotics from 09/16/2017. No evidence of active infection anywhere.  2. Right-sided hydronephrosis. S/P replacement of percutaneous nephrostomy tube. Urine output adequate. Monitor with Foley catheter.  3. DLB CL. Oncology following in the hospital. Currently chemotherapy on hold given suspected sepsis.  4. Severe protein calorie malnutrition. Dietitian consulted. Continue protein supplement.  5. Rheumatoid arthritis. On methotrexate in the past currently not on any medication. Stable.  6. Anemia. Hemoglobin stable after transfusion. Oncology wants to maintain hemoglobin more than 8.  7. Neutropenia. Oncology recommends to continue on G-CSF on a daily basis until [place] is more than 1500. 09/16/2017 ANC is 3600. Discontinue G-CSF.  8. Constipation. Patient feeling nauseous and has some abdominal pain and is not passing gas. X-ray shows no evidence of obstruction and shows significant constipation. Constipation resolved on its own. We will continue with stools regimen.  9. Acute diastolic CHF. Volume overload. Echocardiogram on 08/30/2017 shows grade 1 diastolic dysfunction. EF 60-65%. Patient aggressively hydrated this admission with 14 L positive at peak. Started on IV Lasix. Diuresing well. Avoiding aggressive diuresis given tendency to develop hypokalemia.  Diet: Cardiac diet DVT Prophylaxis: mechanical compression device  Advance goals of care discussion: full code  Family Communication: family was present at bedside, at the time of interview.   Disposition:  Discharge to home.  Consultants: onco, PCCM  Procedures: none  Antibiotics: Anti-infectives    Start     Dose/Rate Route Frequency Ordered Stop   09/15/17 1600  cefTRIAXone (ROCEPHIN) 2 g in dextrose 5 % 50 mL IVPB  Status:  Discontinued     2 g 100 mL/hr  over 30  Minutes Intravenous Every 24 hours 09/15/17 1522 09/16/17 1623   09/14/17 1800  vancomycin (VANCOCIN) 1,250 mg in sodium chloride 0.9 % 250 mL IVPB  Status:  Discontinued     1,250 mg 166.7 mL/hr over 90 Minutes Intravenous Every 24 hours 09/13/17 1845 09/15/17 0730   09/12/17 0400  vancomycin (VANCOCIN) 1,250 mg in sodium chloride 0.9 % 250 mL IVPB  Status:  Discontinued     1,250 mg 166.7 mL/hr over 90 Minutes Intravenous Every 12 hours 09/11/17 0952 09/13/17 1845   09/11/17 1600  piperacillin-tazobactam (ZOSYN) IVPB 3.375 g  Status:  Discontinued     3.375 g 12.5 mL/hr over 240 Minutes Intravenous Every 8 hours 09/11/17 0952 09/15/17 1521   09/11/17 0945  vancomycin (VANCOCIN) IVPB 1000 mg/200 mL premix     1,000 mg 200 mL/hr over 60 Minutes Intravenous  Once 09/11/17 0930 09/11/17 1646   09/11/17 0945  piperacillin-tazobactam (ZOSYN) IVPB 3.375 g     3.375 g 100 mL/hr over 30 Minutes Intravenous  Once 09/11/17 0930 09/11/17 1218       Objective: Physical Exam: Vitals:   09/17/17 1145 09/17/17 1310 09/17/17 1407 09/17/17 1545  BP:  (!) 94/52 (!) 103/48   Pulse:  98 95   Resp:  20 20   Temp:  97.9 F (36.6 C) 97.6 F (36.4 C)   TempSrc:  Oral Oral   SpO2: 95% 100% 100% 100%  Weight:      Height:        Intake/Output Summary (Last 24 hours) at 09/17/17 1815 Last data filed at 09/17/17 1405  Gross per 24 hour  Intake              530 ml  Output             2575 ml  Net            -2045 ml   Filed Weights   09/14/17 0500 09/15/17 0500 09/16/17 0500  Weight: 109.4 kg (241 lb 2.9 oz) 110.6 kg (243 lb 13.3 oz) 112.6 kg (248 lb 3.8 oz)   General: Alert, Awake and Oriented to Time, Place and Person. Appear in moderate distress, affect appropriate Eyes: PERRL, Conjunctiva normal ENT: Oral Mucosa clear moist. Neck: difficult to assess JVD, no Abnormal Mass Or lumps Cardiovascular: S1 and S2 Present, no Murmur, Peripheral Pulses Present Respiratory: normal  respiratory effort, Bilateral Air entry equal and Decreased, no use of accessory muscle, Clear to Auscultation, no Crackles, no wheezes Abdomen: Bowel Sound present, Soft and no tenderness, no hernia Skin: no redness, no Rash, no induration Extremities: bilateral Pedal edema, no calf tenderness Neurologic: Grossly no focal neuro deficit. Bilaterally Equal motor strength  Data Reviewed: CBC:  Recent Labs Lab 09/13/17 0415 09/14/17 0303 09/14/17 1930 09/15/17 0500 09/16/17 0431 09/17/17 0030  WBC 1.1* 1.4*  --  3.2* 6.3 14.7*  NEUTROABS 0.6* 0.5*  --  0.6* 3.5 9.4*  HGB 7.7* 7.7* 9.5* 9.6* 9.3* 9.7*  HCT 22.3* 22.8* 28.0* 27.8* 27.5* 28.5*  MCV 93.7 94.6  --  90.6 91.1 90.2  PLT 48* 50*  --  52* 57* 63*   Basic Metabolic Panel:  Recent Labs Lab 09/12/17 0530 09/13/17 0415 09/14/17 0303 09/15/17 0500 09/16/17 0431 09/17/17 0030  NA 134* 136 134* 136 138 136  K 4.0 3.5 3.3* 3.0* 3.2* 3.1*  CL 105 109 108 111 111 108  CO2 22 22 20* 20* 21* 21*  GLUCOSE 115* 139* 151* 113* 118* 148*  BUN 34* 32* 36* 38* 36* 39*  CREATININE 0.66 0.71  0.70 0.80  0.82 0.87 0.90 0.86  CALCIUM 8.5* 8.8* 8.4* 8.2* 8.0* 7.7*  MG 1.4* 1.7 1.7 1.7 1.6* 1.8  PHOS 4.9* 4.5 3.9  --   --   --     Liver Function Tests:  Recent Labs Lab 09/10/17 2319 09/13/17 0415 09/14/17 0303 09/15/17 0500 09/16/17 0431  AST 25  --  21 20 22   ALT 25  --  30 30 32  ALKPHOS 66  --  56 53 59  BILITOT 1.2  --  1.5* 1.7* 1.7*  PROT <3.0*  --  <3.0* 3.0* 3.2*  ALBUMIN 1.4* 1.5* 1.5* 1.6* 1.7*   No results for input(s): LIPASE, AMYLASE in the last 168 hours. No results for input(s): AMMONIA in the last 168 hours. Coagulation Profile:  Recent Labs Lab 09/11/17 1415 09/15/17 0535  INR 1.17 1.23   Cardiac Enzymes: No results for input(s): CKTOTAL, CKMB, CKMBINDEX, TROPONINI in the last 168 hours. BNP (last 3 results) No results for input(s): PROBNP in the last 8760 hours. CBG: No results for  input(s): GLUCAP in the last 168 hours. Studies: No results found.  Scheduled Meds: . albuterol  3 mL Inhalation QID  . Chlorhexidine Gluconate Cloth  6 each Topical q morning - 10a  . feeding supplement (PRO-STAT SUGAR FREE 64)  30 mL Oral BID  . furosemide  40 mg Intravenous BID  . [START ON 09/18/2017] hydrocortisone sod succinate (SOLU-CORTEF) inj  50 mg Intravenous Daily  . magic mouthwash w/lidocaine  5 mL Oral QID  . pantoprazole  40 mg Oral Daily  . polyethylene glycol  17 g Oral Daily  . protein supplement shake  2 oz Oral QID  . sodium chloride flush  5 mL Intravenous Q8H   Continuous Infusions:  PRN Meds: ondansetron, oxybutynin, phenol, sodium chloride flush, traMADol  Time spent: 35 minutes  Author: Berle Mull, MD [facility] Pager: [phone] 09/17/2017 6:15 PM  If 7PM-7AM, please contact night-coverage at [email or web address], password [facility]

## 2017-09-18 ENCOUNTER — Inpatient Hospital Stay (HOSPITAL_COMMUNITY): Payer: Medicare Other

## 2017-09-18 LAB — BASIC METABOLIC PANEL
Anion gap: 6 (ref 5–15)
BUN: 35 mg/dL — AB (ref 6–20)
CHLORIDE: 107 mmol/L (ref 101–111)
CO2: 24 mmol/L (ref 22–32)
CREATININE: 0.78 mg/dL (ref 0.44–1.00)
Calcium: 7.5 mg/dL — ABNORMAL LOW (ref 8.9–10.3)
GFR calc Af Amer: >60 mL/min (ref >60)
GFR calc non Af Amer: >60 mL/min (ref >60)
GLUCOSE: 138 mg/dL — AB (ref 65–99)
POTASSIUM: 2.7 mmol/L — AB (ref 3.5–5.1)
Sodium: 137 mmol/L (ref 135–145)

## 2017-09-18 LAB — RESPIRATORY PANEL BY PCR
ADENOVIRUS-RVPPCR: NOT DETECTED
BORDETELLA PERTUSSIS-RVPCR: NOT DETECTED
CHLAMYDOPHILA PNEUMONIAE-RVPPCR: NOT DETECTED
CORONAVIRUS NL63-RVPPCR: NOT DETECTED
Coronavirus 229E: NOT DETECTED
Coronavirus HKU1: NOT DETECTED
Coronavirus OC43: NOT DETECTED
INFLUENZA A-RVPPCR: NOT DETECTED
INFLUENZA B-RVPPCR: NOT DETECTED
MYCOPLASMA PNEUMONIAE-RVPPCR: NOT DETECTED
Metapneumovirus: NOT DETECTED
PARAINFLUENZA VIRUS 4-RVPPCR: NOT DETECTED
Parainfluenza Virus 1: NOT DETECTED
Parainfluenza Virus 2: DETECTED — AB
Parainfluenza Virus 3: NOT DETECTED
RHINOVIRUS / ENTEROVIRUS - RVPPCR: NOT DETECTED
Respiratory Syncytial Virus: NOT DETECTED

## 2017-09-18 LAB — CBC
HEMATOCRIT: 25.6 % — AB (ref 36.0–46.0)
Hemoglobin: 8.6 g/dL — ABNORMAL LOW (ref 12.0–15.0)
MCH: 30.6 pg (ref 26.0–34.0)
MCHC: 33.6 g/dL (ref 30.0–36.0)
MCV: 91.1 fL (ref 78.0–100.0)
Platelets: 63 10*3/uL — ABNORMAL LOW (ref 150–400)
RBC: 2.81 MIL/uL — ABNORMAL LOW (ref 3.87–5.11)
RDW: 22.8 % — AB (ref 11.5–15.5)
WBC: 22.5 10*3/uL — ABNORMAL HIGH (ref 4.0–10.5)

## 2017-09-18 LAB — MAGNESIUM: Magnesium: 1.6 mg/dL — ABNORMAL LOW (ref 1.7–2.4)

## 2017-09-18 MED ORDER — ALBUTEROL SULFATE (2.5 MG/3ML) 0.083% IN NEBU
2.5000 mg | INHALATION_SOLUTION | Freq: Four times a day (QID) | RESPIRATORY_TRACT | Status: DC
  Administered 2017-09-18 – 2017-09-20 (×8): 2.5 mg via RESPIRATORY_TRACT
  Filled 2017-09-18 (×10): qty 3

## 2017-09-18 MED ORDER — MENTHOL 3 MG MT LOZG
1.0000 | LOZENGE | OROMUCOSAL | Status: DC | PRN
  Filled 2017-09-18: qty 9

## 2017-09-18 MED ORDER — POTASSIUM CHLORIDE CRYS ER 20 MEQ PO TBCR: 40.0000 meq | EXTENDED_RELEASE_TABLET | Freq: Two times a day (BID) | ORAL | Status: DC

## 2017-09-18 MED ORDER — FUROSEMIDE 10 MG/ML IJ SOLN
40.0000 mg | Freq: Every day | INTRAMUSCULAR | Status: DC
  Administered 2017-09-19 – 2017-09-26 (×8): 40 mg via INTRAVENOUS
  Filled 2017-09-18 (×8): qty 4

## 2017-09-18 MED ORDER — POTASSIUM CHLORIDE CRYS ER 20 MEQ PO TBCR
40.0000 meq | EXTENDED_RELEASE_TABLET | Freq: Two times a day (BID) | ORAL | Status: DC
  Administered 2017-09-18: 40 meq via ORAL
  Filled 2017-09-18: qty 2

## 2017-09-18 MED ORDER — POTASSIUM CHLORIDE CRYS ER 20 MEQ PO TBCR
40.0000 meq | EXTENDED_RELEASE_TABLET | Freq: Two times a day (BID) | ORAL | Status: DC
  Administered 2017-09-18 – 2017-10-03 (×30): 40 meq via ORAL
  Filled 2017-09-18 (×30): qty 2

## 2017-09-18 MED ORDER — POTASSIUM CHLORIDE CRYS ER 20 MEQ PO TBCR
40.0000 meq | EXTENDED_RELEASE_TABLET | ORAL | Status: DC
  Administered 2017-09-18: 40 meq via ORAL
  Filled 2017-09-18: qty 2

## 2017-09-18 MED ORDER — BENZONATATE 100 MG PO CAPS
100.0000 mg | ORAL_CAPSULE | Freq: Three times a day (TID) | ORAL | Status: DC
  Administered 2017-09-18 – 2017-10-03 (×41): 100 mg via ORAL
  Filled 2017-09-18 (×44): qty 1

## 2017-09-18 MED ORDER — FUROSEMIDE 10 MG/ML IJ SOLN
20.0000 mg | Freq: Once | INTRAMUSCULAR | Status: AC
  Administered 2017-09-18: 20 mg via INTRAVENOUS
  Filled 2017-09-18: qty 2

## 2017-09-18 MED ORDER — MAGNESIUM SULFATE 2 GM/50ML IV SOLN
2.0000 g | Freq: Once | INTRAVENOUS | Status: AC
  Administered 2017-09-18: 2 g via INTRAVENOUS
  Filled 2017-09-18: qty 50

## 2017-09-18 NOTE — Progress Notes (Addendum)
[facility] Progress Note  Patient: Kristina Torres XBM:[id number]   PCP: [name], MD DOB: Nov 10, 1946   DOA: 09/10/2017   DOS: 09/18/2017   Date of Service: the patient was seen and examined on 09/18/2017  Subjective: feeling better, breathing is better during the day. Occasional wheezing at night.  Brief hospital course: Pt. with PMH of DLBCL, RA, HLD, HTN; admitted on 09/10/2017, presented with complaint of low blood pressure, was found to have suspected sepsis. Currently further plan is continue current plan. 71 y.o. Female with history of multiple medical problems including rheumatoid arthritis, hyperlipidemia, history of hypertension, and diffuse large B-cell lymphoma. Patient recently hospitalized with right-sided hydronephrosis status post percutaneous nephrostomy tube and exchange on 9/14 after tube malfunction. Patient started on chemotherapy with Cytoxan and vincristine on 9/20 and subsequently discharged to rehabilitation on 9/22. Patient's tube was fractured at her nursing facility and patient was subsequently transferred to our facility for evaluation. Upon arrival the patient was noted to be hypotensive with borderline blood pressure going back for at least 2 weeks. Patient has notable anasarca but denies any chest pain or pressure. She denies any dyspnea or cough. No subjective fever or chills. Bolus IV fluid and blood products were administered in the emergency department and she was subsequently started on vasopressor support.  Assessment and Plan: 1. Hypotension, distributive shock. Patient was started on empiric vancomycin and Zosyn.  Blood culture so far no growth. Patient was hypotensive requiring IV fluid bolus as well as IV pressors, currently off of the pressors since 09/14/2017. Blood pressure getting better .  Blood cultures no growth. MRSA PCR recently negative as well.  Patient received 5 days of IV antibiotics with vancomycin and Zosyn, will discontinue  antibiotics from 09/16/2017. No evidence of active infection anywhere.  2. Right-sided hydronephrosis. S/P replacement of percutaneous nephrostomy tube. Urine output adequate. Monitor with Foley catheter.  3. Acute diastolic CHF. Volume overload. Acute hypoxic respiratory failure due to parainfluenza virus.  Echocardiogram on 08/30/2017 shows grade 1 diastolic dysfunction. EF 60-65%. Patient aggressively hydrated this admission with 14 L positive at peak. Started on IV Lasix. Diuresing well. 7 L negative in last 3 days. Avoiding aggressive diuresis given tendency to develop hypotension.  4.  DLBCL. Oncology following in the hospital. Currently chemotherapy on hold given suspected sepsis.  5. Severe protein calorie malnutrition. Dietitian consulted. Continue protein supplement. Patient is tolerating oral diet and with adequate oral intake, there is no indication to give this patient TPN.  6. Anemia. Hemoglobin stable after transfusion. Oncology wants to maintain hemoglobin more than 8.  7. Neutropenia. Oncology recommends to continue on G-CSF on a daily basis until [place] is more than 1500. 09/16/2017 ANC is 3600. Discontinue G-CSF.  8. Constipation. Now resolved. Continue stool softeners.  9. Rheumatoid arthritis. On methotrexate in the past currently not on any medication. Stable.  Diet: Cardiac diet DVT Prophylaxis: mechanical compression device  Advance goals of care discussion: full code  Family Communication: family was present at bedside, at the time of interview.   Disposition:  Discharge to SNF.  Consultants: onco, PCCM  Procedures: Echocardiogram   Antibiotics: Anti-infectives    Start     Dose/Rate Route Frequency Ordered Stop   09/15/17 1600  cefTRIAXone (ROCEPHIN) 2 g in dextrose 5 % 50 mL IVPB  Status:  Discontinued     2 g 100 mL/hr over 30 Minutes Intravenous Every 24 hours 09/15/17 1522 09/16/17 1623   09/14/17 1800  vancomycin (VANCOCIN) 1,250  mg in  sodium chloride 0.9 % 250 mL IVPB  Status:  Discontinued     1,250 mg 166.7 mL/hr over 90 Minutes Intravenous Every 24 hours 09/13/17 1845 09/15/17 0730   09/12/17 0400  vancomycin (VANCOCIN) 1,250 mg in sodium chloride 0.9 % 250 mL IVPB  Status:  Discontinued     1,250 mg 166.7 mL/hr over 90 Minutes Intravenous Every 12 hours 09/11/17 0952 09/13/17 1845   09/11/17 1600  piperacillin-tazobactam (ZOSYN) IVPB 3.375 g  Status:  Discontinued     3.375 g 12.5 mL/hr over 240 Minutes Intravenous Every 8 hours 09/11/17 0952 09/15/17 1521   09/11/17 0945  vancomycin (VANCOCIN) IVPB 1000 mg/200 mL premix     1,000 mg 200 mL/hr over 60 Minutes Intravenous  Once 09/11/17 0930 09/11/17 1646   09/11/17 0945  piperacillin-tazobactam (ZOSYN) IVPB 3.375 g     3.375 g 100 mL/hr over 30 Minutes Intravenous  Once 09/11/17 0930 09/11/17 1218       Objective: Physical Exam: Vitals:   09/18/17 1145 09/18/17 1200 09/18/17 1256 09/18/17 1340  BP: 117/83   108/62  Pulse:    99  Resp:    (!) 24  Temp:    98.5 F (36.9 C)  TempSrc:    Oral  SpO2:   99% 95%  Weight:  102.5 kg (225 lb 15.5 oz)    Height:        Intake/Output Summary (Last 24 hours) at 09/18/17 1847 Last data filed at 09/18/17 1400  Gross per 24 hour  Intake              440 ml  Output             3880 ml  Net            -3440 ml   Filed Weights   09/16/17 0500 09/18/17 0610 09/18/17 1200  Weight: 112.6 kg (248 lb 3.8 oz) 47.5 kg (104 lb 11.2 oz) 102.5 kg (225 lb 15.5 oz)   General: Alert, Awake and Oriented to Time, Place and Person. Appear in moderate distress, affect appropriate Eyes: PERRL, Conjunctiva normal ENT: Oral Mucosa clear moist. Neck: difficult to assess JVD, no Abnormal Mass Or lumps Cardiovascular: S1 and S2 Present, no Murmur, Peripheral Pulses Present Respiratory: normal respiratory effort, Bilateral Air entry equal and Decreased, no use of accessory muscle, Clear to Auscultation, no Crackles, no  wheezes Abdomen: Bowel Sound present, Soft and no tenderness, no hernia Skin: no redness, no Rash, no induration Extremities: bilateral Pedal edema, no calf tenderness Neurologic: Grossly no focal neuro deficit. Bilaterally Equal motor strength  Data Reviewed: CBC:  Recent Labs Lab 09/13/17 0415 09/14/17 0303 09/14/17 1930 09/15/17 0500 09/16/17 0431 09/17/17 0030 09/18/17 0351  WBC 1.1* 1.4*  --  3.2* 6.3 14.7* 22.5*  NEUTROABS 0.6* 0.5*  --  0.6* 3.5 9.4*  --   HGB 7.7* 7.7* 9.5* 9.6* 9.3* 9.7* 8.6*  HCT 22.3* 22.8* 28.0* 27.8* 27.5* 28.5* 25.6*  MCV 93.7 94.6  --  90.6 91.1 90.2 91.1  PLT 48* 50*  --  52* 57* 63* 63*   Basic Metabolic Panel:  Recent Labs Lab 09/12/17 0530 09/13/17 0415 09/14/17 0303 09/15/17 0500 09/16/17 0431 09/17/17 0030 09/18/17 0351  NA 134* 136 134* 136 138 136 137  K 4.0 3.5 3.3* 3.0* 3.2* 3.1* 2.7*  CL 105 109 108 111 111 108 107  CO2 22 22 20* 20* 21* 21* 24  GLUCOSE 115* 139* 151* 113* 118* 148* 138*  BUN  34* 32* 36* 38* 36* 39* 35*  CREATININE 0.66 0.71  0.70 0.80  0.82 0.87 0.90 0.86 0.78  CALCIUM 8.5* 8.8* 8.4* 8.2* 8.0* 7.7* 7.5*  MG 1.4* 1.7 1.7 1.7 1.6* 1.8 1.6*  PHOS 4.9* 4.5 3.9  --   --   --   --     Liver Function Tests:  Recent Labs Lab 09/13/17 0415 09/14/17 0303 09/15/17 0500 09/16/17 0431  AST  --  21 20 22   ALT  --  30 30 32  ALKPHOS  --  56 53 59  BILITOT  --  1.5* 1.7* 1.7*  PROT  --  <3.0* 3.0* 3.2*  ALBUMIN 1.5* 1.5* 1.6* 1.7*   No results for input(s): LIPASE, AMYLASE in the last 168 hours. No results for input(s): AMMONIA in the last 168 hours. Coagulation Profile:  Recent Labs Lab 09/15/17 0535  INR 1.23   Cardiac Enzymes: No results for input(s): CKTOTAL, CKMB, CKMBINDEX, TROPONINI in the last 168 hours. BNP (last 3 results) No results for input(s): PROBNP in the last 8760 hours. CBG: No results for input(s): GLUCAP in the last 168 hours. Studies: Dg Chest Port 1 View  Result Date:  09/18/2017 CLINICAL DATA:  Shortness of breath. EXAM: PORTABLE CHEST 1 VIEW COMPARISON:  09/10/2017 FINDINGS: Power port appears in good position. Tip is approximately 2 cm below the carina in good position. Since the prior study the patient has developed bilateral pleural effusions, left greater than right. Pulmonary vascularity appears normal. Heart size is normal. No acute bone abnormality. IMPRESSION: New bilateral pleural effusions. Small on the right and moderate on the left. Electronically Signed   By: Lorriane Shire M.D.   On: 09/18/2017 08:51    Scheduled Meds: . albuterol  2.5 mg Nebulization Q6H  . benzonatate  100 mg Oral TID  . Chlorhexidine Gluconate Cloth  6 each Topical q morning - 10a  . hydrocortisone sod succinate (SOLU-CORTEF) inj  50 mg Intravenous Daily  . pantoprazole  40 mg Oral Daily  . potassium chloride  40 mEq Oral BID  . protein supplement shake  2 oz Oral QID   Continuous Infusions:  PRN Meds: menthol-cetylpyridinium, ondansetron, phenol, sodium chloride flush, traMADol  Time spent: 35 minutes  Author: Berle Mull, MD [facility] Pager: [phone] 09/18/2017 6:47 PM  If 7PM-7AM, please contact night-coverage at [email or web address], password [facility]

## 2017-09-18 NOTE — Progress Notes (Signed)
Gave pt. Treatment as requested, however, pt. Continues to have profound wheezing.  Spoke to patients RN and suggested that MD be called and possible CXR needed and/or increased steroids.

## 2017-09-18 NOTE — Progress Notes (Signed)
CRITICAL VALUE ALERT  Critical Value: Potassium 2.7  Date & Time Notied:  09/18/17@0511   Provider Notified: Yes  Orders Received/Actions taken: Yes

## 2017-09-18 NOTE — Consult Note (Signed)
   [facility] CM Inpatient Consult   09/18/2017  Kristina Torres 11/27/1946 [id number]    Screened for potential [facility] Care Management needs.   Chart reviewed. Appears discharge plan is for SNF.   No identifiable [facility] Care Management needs at this time.   Marthenia Rolling, MSN-Ed, RN,BSN [facility] Liaison [phone]

## 2017-09-18 NOTE — Progress Notes (Signed)
Initial Nutrition Assessment  DOCUMENTATION CODES:   Morbid obesity  INTERVENTION:    RD to encourage PO intake  Provide Premier Protein TID, each supplement provides 160kcal and 30g protein.   Monitor and supplement electrolytes as need per MD descretion.   NUTRITION DIAGNOSIS:   Increased nutrient needs related to catabolic illness, cancer and cancer related treatments as evidenced by estimated needs.  GOAL:   Patient will meet greater than or equal to 90% of their needs  MONITOR:   PO intake, Supplement acceptance, Diet advancement, Weight trends, Labs, Skin  REASON FOR ASSESSMENT:   Consult Assessment of nutrition requirement/status  ASSESSMENT:   71 y.o. Female with history of multiple medical problems including rheumatoid arthritis, hyperlipidemia, history of hypertension, and diffuse large B-cell lymphoma. Patient recently hospitalized with right-sided hydronephrosis status post percutaneous nephrostomy tube and exchange on 9/14 after tube malfunction. Patient started on chemotherapy with Cytoxan and vincristine on 9/20 and subsequently discharged to rehabilitation on 9/22. Patient's tube was fractured at her nursing facility and patient was subsequently transferred to [facility] for evaluation.  Spoke with pt at bedside. Pt reports having difficulties tolerating prostat supplementation related to thrush symptoms. Pt shows to have large sore on her mouth that contributes to poor toleration of different food textures. Pt states she cannot swallow meat, lettuce, and fruit. Discussed products she can use at home for extra protein. Pt aware of how to read food label in regard to sodium limitations.Pt currently consuming 50% average of her last 8 meals. MD d/c prostat. RD to provide premier protein TID.   Weight noted to be increased 32 lb since last follow up. Suspect mostly fluid accumulation.   Medications reviewed and include: KCl Labs reviewed: K 2.7 (L) BUN 35 (H) Mg 1.6 (L)    Diet Order:  Diet 2 gram sodium Room service appropriate? Yes; Fluid consistency: Thin  Skin:  Reviewed, no issues  Last BM:  09/18/17  Height:   Ht Readings from Last 1 Encounters:  09/10/17 5' (1.524 m)    Weight:   Wt Readings from Last 1 Encounters:  09/18/17 104 lb 11.2 oz (47.5 kg)    Ideal Body Weight:  47.73 kg  BMI:  Body mass index is 21.88 kg/m.  Estimated Nutritional Needs:   Kcal:  2010-2215 (20-22 kcal/kg)  Protein:  100-110 grams  Fluid:  1.6-1.8 L/day  EDUCATION NEEDS:   No education needs identified at this time  Dewy Rose, LDN Clinical Nutrition Pager # - [phone]

## 2017-09-18 NOTE — Progress Notes (Signed)
Physical Therapy Treatment Patient Details Name: Kristina Torres MRN: [id number] DOB: [birth year] Today's Date: 09/18/2017    History of Present Illness 71 y.o. Female with history of multiple medical problems including rheumatoid arthritis, hyperlipidemia, history of hypertension, and diffuse large B-cell lymphoma. Patient recently hospitalized with right-sided hydronephrosis status post percutaneous nephrostomy tube and exchange on 9/14 after tube malfunction. Patient started on chemotherapy with Cytoxan and vincristine on 9/20 and subsequently discharged to rehabilitation on 9/22. Patient's tube was fractured at her nursing facility and returned to hospital and was found to be hypotensive with borderline blood pressure for at least 2 weeks. PMH: HTN, HLD, gout, RA    PT Comments    Pt only agreeable to attempt sitting EOB.  Supine: BP 102/50 mmHg, HR 91 bpm  Sitting: BP 117/83 mmHg, HR 113 bpm.  Pt reported dizziness with sitting and did not tolerate sitting EOB (approx 2 minutes).  Continue to recommend SNF upon d/c.  Pt requiring max-total assist for bed mobility at this time.   Follow Up Recommendations  SNF     Equipment Recommendations  None recommended by PT    Recommendations for Other Services       Precautions / Restrictions Precautions Precautions: Fall Precaution Comments: right side nephrostomy tube, Port Restrictions Weight Bearing Restrictions: No    Mobility  Bed Mobility Overal bed mobility: Needs Assistance Bed Mobility: Supine to Sit;Sit to Supine     Supine to sit: Max assist Sit to supine: Max assist;+2 for physical assistance   General bed mobility comments: pt attempting to assist using rails and upper body, assist for lower body and scooting to EOB, assist for trunk upright  Transfers                    Ambulation/Gait                 Stairs            Wheelchair Mobility    Modified Rankin (Stroke Patients Only)        Balance Overall balance assessment: Needs assistance Sitting-balance support: Feet supported;Bilateral upper extremity supported Sitting balance-Leahy Scale: Zero                                      Cognition Arousal/Alertness: Awake/alert Behavior During Therapy: WFL for tasks assessed/performed Overall Cognitive Status: Within Functional Limits for tasks assessed                                        Exercises      General Comments General comments (skin integrity, edema, etc.): Pt remained on 2L O2 [place], more difficulty breathing upon sitting, RN in room      Pertinent Vitals/Pain Pain Assessment: 0-10 Pain Score: 7  Pain Location: drain site, legs Pain Descriptors / Indicators: Sore Pain Intervention(s): Monitored during session;Repositioned (RN in room)    Home Living                      Prior Function            PT Goals (current goals can now be found in the care plan section) Progress towards PT goals: Progressing toward goals    Frequency    Min 2X/week      PT Plan  Current plan remains appropriate    Co-evaluation              AM-PAC PT "6 Clicks" Daily Activity  Outcome Measure  Difficulty turning over in bed (including adjusting bedclothes, sheets and blankets)?: Unable Difficulty moving from lying on back to sitting on the side of the bed? : Unable Difficulty sitting down on and standing up from a chair with arms (Torres.g., wheelchair, bedside commode, etc,.)?: Unable Help needed moving to and from a bed to chair (including a wheelchair)?: Total Help needed walking in hospital room?: Total Help needed climbing 3-5 steps with a railing? : Total 6 Click Score: 6    End of Session   Activity Tolerance: Patient limited by fatigue Patient left: in bed;with call bell/phone within reach;with bed alarm set;with family/visitor present Nurse Communication: Mobility status PT Visit Diagnosis: Muscle  weakness (generalized) (M62.81);Other abnormalities of gait and mobility (R26.89)     Time: 1140-1150 PT Time Calculation (min) (ACUTE ONLY): 10 min  Charges:  $Therapeutic Activity: 8-22 mins                    G Codes:       Kristina Torres, PT, DPT 09/18/2017 Pager: [phone]  Kristina Torres 09/18/2017, 3:40 PM

## 2017-09-19 ENCOUNTER — Inpatient Hospital Stay (HOSPITAL_COMMUNITY): Payer: Medicare Other

## 2017-09-19 ENCOUNTER — Encounter (HOSPITAL_COMMUNITY): Payer: Self-pay | Admitting: Radiology

## 2017-09-19 DIAGNOSIS — R110 Nausea: Secondary | ICD-10-CM

## 2017-09-19 DIAGNOSIS — R0602 Shortness of breath: Secondary | ICD-10-CM

## 2017-09-19 DIAGNOSIS — R5383 Other fatigue: Secondary | ICD-10-CM

## 2017-09-19 LAB — CBC
HCT: 23.9 % — ABNORMAL LOW (ref 36.0–46.0)
Hemoglobin: 8.1 g/dL — ABNORMAL LOW (ref 12.0–15.0)
MCH: 31.5 pg (ref 26.0–34.0)
MCHC: 33.9 g/dL (ref 30.0–36.0)
MCV: 93 fL (ref 78.0–100.0)
PLATELETS: 65 10*3/uL — AB (ref 150–400)
RBC: 2.57 MIL/uL — ABNORMAL LOW (ref 3.87–5.11)
RDW: 22.8 % — ABNORMAL HIGH (ref 11.5–15.5)
WBC: 24.2 10*3/uL — AB (ref 4.0–10.5)

## 2017-09-19 LAB — BASIC METABOLIC PANEL
Anion gap: 6 (ref 5–15)
BUN: 34 mg/dL — AB (ref 6–20)
CHLORIDE: 107 mmol/L (ref 101–111)
CO2: 25 mmol/L (ref 22–32)
CREATININE: 0.65 mg/dL (ref 0.44–1.00)
Calcium: 7.3 mg/dL — ABNORMAL LOW (ref 8.9–10.3)
GFR calc Af Amer: >60 mL/min (ref >60)
GFR calc non Af Amer: >60 mL/min (ref >60)
Glucose, Bld: 129 mg/dL — ABNORMAL HIGH (ref 65–99)
Potassium: 3 mmol/L — ABNORMAL LOW (ref 3.5–5.1)
SODIUM: 138 mmol/L (ref 135–145)

## 2017-09-19 LAB — GLUCOSE, CAPILLARY: GLUCOSE-CAPILLARY: 145 mg/dL — AB (ref 65–99)

## 2017-09-19 LAB — MAGNESIUM: MAGNESIUM: 1.8 mg/dL (ref 1.7–2.4)

## 2017-09-19 MED ORDER — MAGNESIUM SULFATE 2 GM/50ML IV SOLN
2.0000 g | Freq: Once | INTRAVENOUS | Status: AC
  Administered 2017-09-19: 2 g via INTRAVENOUS
  Filled 2017-09-19: qty 50

## 2017-09-19 NOTE — Plan of Care (Signed)
Problem: Skin Integrity: Goal: Risk for impaired skin integrity will decrease Outcome: Progressing MOisture/barrier cream to MASD to perineal area  Problem: Activity: Goal: Risk for activity intolerance will decrease Outcome: Progressing Dangled at eob w/ PT 10/2.   Problem: Fluid Volume: Goal: Ability to maintain a balanced intake and output will improve Outcome: Progressing Pt with improving anasarca. Ace wraps to ble w/ improved edema to legs. IV lasix.   Problem: Nutrition: Goal: Adequate nutrition will be maintained Outcome: Progressing Poor appetite. Taking Premier protein drinks TID.   Problem: Bowel/Gastric: Goal: Will not experience complications related to bowel motility Outcome: Completed/Met Date Met: 09/19/17 Constipation resolved. Pt having soft BMs daily.

## 2017-09-19 NOTE — Progress Notes (Addendum)
Kristina Torres   DOB:[birth year]   BS#:[id number]   UTM#:[id number]  Heme/onc follow-up  Subjective: This patient is well-known to our service. Vital signs improving.  She has persistent edema, legs are wrapped. She still has significant dyspnea, she has been on IV Lasix, diuresed well, she underwent a CT chest today.   Objective:  Vitals:   09/19/17 1335 09/19/17 1349  BP:  (!) 108/56  Pulse:  (!) 107  Resp:  20  Temp:  (!) 97.4 F (36.3 C)  SpO2: 95% 97%    Body mass index is 47 kg/m.  Intake/Output Summary (Last 24 hours) at 09/19/17 1642 Last data filed at 09/19/17 1500  Gross per 24 hour  Intake              490 ml  Output             3300 ml  Net            -2810 ml     Sclerae unicteric  Oropharynx clear  No peripheral adenopathy  Right lung clear, decreased breath sounds in left lung mild wheezing  Heart regular rate and rhythm  Abdomen benign  MSK diffuse anasarca  Neuro nonfocal, awake and alert  CBG (last 3)   Recent Labs  09/19/17 1132  GLUCAP 145*     Labs:  Lab Results  Component Value Date   WBC 24.2 (H) 09/19/2017   HGB 8.1 (L) 09/19/2017   HCT 23.9 (L) 09/19/2017   MCV 93.0 09/19/2017   PLT 65 (L) 09/19/2017   NEUTROABS 9.4 (H) 09/17/2017    Urine Studies No results for input(s): UHGB, CRYS in the last 72 hours.  Invalid input(s): UACOL, UAPR, USPG, UPH, UTP, UGL, UKET, UBIL, UNIT, UROB, [place], UEPI, UWBC, Junie Panning [place], [place], [place]  Basic Metabolic Panel:  Recent Labs Lab 09/13/17 0415 09/14/17 0303 09/15/17 0500 09/16/17 0431 09/17/17 0030 09/18/17 0351 09/19/17 0352  NA 136 134* 136 138 136 137 138  K 3.5 3.3* 3.0* 3.2* 3.1* 2.7* 3.0*  CL 109 108 111 111 108 107 107  CO2 22 20* 20* 21* 21* 24 25  GLUCOSE 139* 151* 113* 118* 148* 138* 129*  BUN 32* 36* 38* 36* 39* 35* 34*  CREATININE 0.71  0.70 0.80  0.82 0.87 0.90 0.86 0.78 0.65  CALCIUM 8.8* 8.4* 8.2* 8.0* 7.7* 7.5* 7.3*  MG 1.7 1.7 1.7 1.6* 1.8 1.6* 1.8  PHOS 4.5  3.9  --   --   --   --   --    GFR Estimated Creatinine Clearance: 69.3 mL/min (by C-G formula based on SCr of 0.65 mg/dL). Liver Function Tests:  Recent Labs Lab 09/13/17 0415 09/14/17 0303 09/15/17 0500 09/16/17 0431  AST  --  21 20 22   ALT  --  30 30 32  ALKPHOS  --  56 53 59  BILITOT  --  1.5* 1.7* 1.7*  PROT  --  <3.0* 3.0* 3.2*  ALBUMIN 1.5* 1.5* 1.6* 1.7*   No results for input(s): LIPASE, AMYLASE in the last 168 hours. No results for input(s): AMMONIA in the last 168 hours. Coagulation profile  Recent Labs Lab 09/15/17 0535  INR 1.23    CBC:  Recent Labs Lab 09/13/17 0415 09/14/17 0303  09/15/17 0500 09/16/17 0431 09/17/17 0030 09/18/17 0351 09/19/17 0352  WBC 1.1* 1.4*  --  3.2* 6.3 14.7* 22.5* 24.2*  NEUTROABS 0.6* 0.5*  --  0.6* 3.5 9.4*  --   --  HGB 7.7* 7.7*  < > 9.6* 9.3* 9.7* 8.6* 8.1*  HCT 22.3* 22.8*  < > 27.8* 27.5* 28.5* 25.6* 23.9*  MCV 93.7 94.6  --  90.6 91.1 90.2 91.1 93.0  PLT 48* 50*  --  52* 57* 63* 63* 65*  < > = values in this interval not displayed. Cardiac Enzymes: No results for input(s): CKTOTAL, CKMB, CKMBINDEX, TROPONINI in the last 168 hours. BNP: Invalid input(s): POCBNP CBG:  Recent Labs Lab 09/19/17 1132  GLUCAP 145*   D-Dimer No results for input(s): DDIMER in the last 72 hours. Hgb A1c No results for input(s): HGBA1C in the last 72 hours. Lipid Profile No results for input(s): CHOL, HDL, LDLCALC, TRIG, CHOLHDL, LDLDIRECT in the last 72 hours. Thyroid function studies No results for input(s): TSH, T4TOTAL, T3FREE, THYROIDAB in the last 72 hours.  Invalid input(s): FREET3 Anemia work up No results for input(s): VITAMINB12, FOLATE, FERRITIN, TIBC, IRON, RETICCTPCT in the last 72 hours. Microbiology Recent Results (from the past 240 hour(s))  MRSA PCR Screening     Status: None   Collection Time: 09/11/17  1:29 PM  Result Value Ref Range Status   MRSA by PCR NEGATIVE NEGATIVE Final    Comment:         The GeneXpert MRSA Assay (FDA approved for NASAL specimens only), is one component of a comprehensive MRSA colonization surveillance program. It is not intended to diagnose MRSA infection nor to guide or monitor treatment for MRSA infections.   Urine culture     Status: None   Collection Time: 09/11/17  4:50 PM  Result Value Ref Range Status   Specimen Description URINE, CATHETERIZED  Final   Special Requests NONE  Final   Culture   Final    NO GROWTH Performed at [facility], [address] [place], [place] [address]    Report Status 09/12/2017 FINAL  Final  Culture, blood (routine x 2)     Status: None   Collection Time: 09/11/17 11:35 PM  Result Value Ref Range Status   Specimen Description BLOOD LEFT HAND  Final   Special Requests IN PEDIATRIC BOTTLE Blood Culture adequate volume  Final   Culture   Final    NO GROWTH 5 DAYS Performed at [facility], [place] [address], [place], [place] [address]    Report Status 09/17/2017 FINAL  Final  Culture, blood (routine x 2)     Status: None   Collection Time: 09/11/17 11:35 PM  Result Value Ref Range Status   Specimen Description BLOOD RIGHT HAND  Final   Special Requests IN PEDIATRIC BOTTLE Blood Culture adequate volume  Final   Culture   Final    NO GROWTH 5 DAYS Performed at [facility], [place] [address], [place], [place] [address]    Report Status 09/17/2017 FINAL  Final  Respiratory Panel by PCR     Status: Abnormal   Collection Time: 09/18/17  9:54 AM  Result Value Ref Range Status   Adenovirus NOT DETECTED NOT DETECTED Final   Coronavirus 229E NOT DETECTED NOT DETECTED Final   Coronavirus HKU1 NOT DETECTED NOT DETECTED Final   Coronavirus NL63 NOT DETECTED NOT DETECTED Final   Coronavirus OC43 NOT DETECTED NOT DETECTED Final   Metapneumovirus NOT DETECTED NOT DETECTED Final   Rhinovirus / Enterovirus NOT DETECTED NOT DETECTED Final   Influenza A NOT DETECTED NOT DETECTED Final   Influenza B NOT  DETECTED NOT DETECTED Final   Parainfluenza  Virus 1 NOT DETECTED NOT DETECTED Final   Parainfluenza Virus 2 DETECTED (A) NOT DETECTED Final   Parainfluenza Virus 3 NOT DETECTED NOT DETECTED Final   Parainfluenza Virus 4 NOT DETECTED NOT DETECTED Final   Respiratory Syncytial Virus NOT DETECTED NOT DETECTED Final   Bordetella pertussis NOT DETECTED NOT DETECTED Final   Chlamydophila pneumoniae NOT DETECTED NOT DETECTED Final   Mycoplasma pneumoniae NOT DETECTED NOT DETECTED Final    Comment: Performed at [facility], [place] [address], [place], [place] [address]    Studies:  Ct Chest Wo Contrast  Result Date: 09/19/2017 CLINICAL DATA:  71 year old female with newly diagnosed diffuse large B-cell lymphoma in September 2018, currently undergoing chemotherapy. Cough and shortness of breath for the past 5 days. EXAM: CT CHEST WITHOUT CONTRAST TECHNIQUE: Multidetector CT imaging of the chest was performed following the standard protocol without IV contrast. COMPARISON:  CT of the chest, abdomen and pelvis 09/02/2017. FINDINGS: Cardiovascular: Heart size is normal. There is no significant pericardial fluid, thickening or pericardial calcification. Atherosclerosis in the thoracic aorta. Coronary artery calcification in the left anterior descending coronary artery. Right internal jugular single-lumen porta cath with tip terminating at the superior cavoatrial junction. Partial anomalous pulmonary venous return from the left upper lobe to the innominate vein incidentally noted (normal anatomical variant). Mediastinum/Nodes: No pathologically enlarged mediastinal or hilar lymph nodes. Please note that accurate exclusion of hilar adenopathy is limited on noncontrast CT scans. Several densely calcified mediastinal lymph nodes are incidentally noted. Esophagus is unremarkable in appearance. No axillary lymphadenopathy. Lungs/Pleura: Worsening moderate bilateral pleural effusions lying dependently with areas of  subsegmental atelectasis in the lower lobes of the lungs bilaterally (left greater than right). Patchy multifocal ground-glass attenuation and interlobular septal thickening noted throughout the mid to upper lungs bilaterally. Mild diffuse bronchial wall thickening with mild centrilobular and paraseptal emphysema. Upper Abdomen: Large partially calcified low attenuation lesion in the dome of the right lobe of the liver, similar to numerous prior examinations, incompletely characterized on today's noncontrast CT examination, but likely to represent a large cavernous hemangioma. Large volume of ascites incompletely visualized. Musculoskeletal: Diffuse body wall edema. There are no aggressive appearing lytic or blastic lesions noted in the visualized portions of the skeleton. IMPRESSION: 1. Regression of previously noted lymphadenopathy indicative of a positive response to therapy. 2. New patchy areas of mid to upper lung predominant ground-glass attenuation and interlobular septal thickening. This is nonspecific, but given the normal cardiac size, this does not appear to reflect cardiogenic pulmonary edema. Noncardiogenic edema, alveolar hemorrhage and/or acute atypical infection are the primary differential considerations. 3. Increasing moderate bilateral pleural effusions, large volume of ascites, and diffuse body wall edema, suggestive of a state of underlying anasarca. 4. Aortic atherosclerosis, in addition to left anterior descending coronary artery disease. Assessment for potential risk factor modification, dietary therapy or pharmacologic therapy may be warranted, if clinically indicated. 5. Mild diffuse bronchial wall thickening with mild centrilobular and paraseptal emphysema; imaging findings suggestive of underlying COPD. 6. Additional incidental findings, as above. Aortic Atherosclerosis (ICD10-I70.0) and Emphysema (ICD10-J43.9). Electronically Signed   By: Vinnie Langton M.D.   On: 09/19/2017 15:20    Dg Chest Port 1 View  Result Date: 09/18/2017 CLINICAL DATA:  Shortness of breath. EXAM: PORTABLE CHEST 1 VIEW COMPARISON:  09/10/2017 FINDINGS: Power port appears in good position. Tip is approximately 2 cm below the carina in good position. Since the prior study the patient has developed bilateral pleural effusions, left greater than  right. Pulmonary vascularity appears normal. Heart size is normal. No acute bone abnormality. IMPRESSION: New bilateral pleural effusions. Small on the right and moderate on the left. Electronically Signed   By: Lorriane Shire M.D.   On: 09/18/2017 08:51    Assessment: 71 y.o. female with a history of hypertension, gout, hyperlipidemia and rheumatoid arthritis on methotrexate, presented with severe right hydronephrosis, fatigue, nausea and weakness.  1. Hypotension, sepsis vs Adrenal insufficiency, resolved now  2. Diffuse large B cell lymphoma, with bone marrow involvement, stage IV, IPS 4, high risk, s/p first cycle chemo R-CVP on 09/06/2017 2. Pancytopenia, secondary to #2 and chemo  3. Severe anasarca and hypoalbuminemia  4. RA, MTX on hold for now  5. HTN 6. Right hydronephrosis secondary to ureteral obstruction, status post nephrostomy tube placement 7. History of severe hypercalcemia, resolved  8. Nausea and vomiting, anorexia  9. Severe calorie and protein malnutrition  10 dyspnea, bilateral pleural effusions   Plan:  -Improving overall, but has persistent dyspnea -Chest x-ray 09/18/2017 showed new bilateral pleural effusions, small on the right and moderate on the left. -CT chest without contrast today revealed increasing moderate bilateral pleural effusions, large volume of ascites, and diffuse body wall edema, suggestive of a state of underlying anasarca; I will defer to hospitalist for management  -Incidental finding of regression of previously noted lymphadenopathy indicative of a positive response to therapy, status post 1 cycle R-CVP on  09/06/2017 -Pancytopenia is improving ID workup has been negative; completed 5 days of antibiotics -Continue to improve and advance nutrition and activity, up out of bed, PT/OT -Will continue to follow up  Alla Feeling, NP 09/19/2017  4:42 PM   I have seen the patient, examined her. I agree with the assessment and and plan and have edited the notes.   I have reviewed her CT chest, the new ground-glass infiltrative change in the lungs are possibly related to her anasarca and pulmonary edema (she received a lot of IVF when was admitted for hypotension), but she has not responded well to Lasix. I recommend albumin infusion along with lasix for better diuresis. She is still on steroids (tape off gradually?), status post chemotherapy, immunocompromised, certainly infection is a possibility. Her W BC has increased significantly in the past few days, I recommend pulmonary consult to see what they think, and possible left thoracentesis (with cultures)   Will follow up closely   Truitt Merle  09/19/2017

## 2017-09-19 NOTE — Progress Notes (Signed)
Patient ID: Kristina Torres, female   DOB: May 26, 1946, 71 y.o.   MRN: [id number]    PROGRESS NOTE    Kristina Torres  UVO:[id number] DOB: Nov 14, 1946 DOA: 09/10/2017  PCP: [name], MD   Brief Narrative:  Pt is 71 yo female with known DLBCL (started on chemo Cytoxan and Vincristine on 9/20), RA, HLD, HTN, recent hospitalization for right sided hydronephrosis and is now s/p perc nephrostomy tube placement and exchange on 08/31/2017 after tube malfunction, presented with weakness, found to be hypotensive with presumptive sepsis. In ED, pt has received boluses of IV NS and was admitted for further evaluation. She has initially required vasopressor support but now off of it and SBP still in 100's.   Assessment & Plan: Hypotension - suspected distributive shock,has been off pressors since 09/14/2017 - SBP remains overall on low end of normal but stable  - blood cultures with no growth to date - Pt has received total 5 days of Vancomycin and Zosyn, ABX stopped as no clear infectious etiology was identified   Acute respiratory distress - suspect acute diastolic CHF, bilateral pleural effusions, parainfluenza virus   - I have reviewed CT chest from 9/16 where bilateral pleural effusions have been identified and I suspect this has been getting worse and thus contributing to worsening dyspnea - CXR also reviewed with pt and family at bedside - we have discussed ned for repeat CT chest so that we can compare the images and decide if pt would benefit from thoracentesis - pt is currently on IVF lasix but clinically not improving much - unable to increase the dose or frequency of lasix due to hypotension  - further recommendations pending CT chest results  - monitor daily weights, I/O  Right sided hydronephrosis - s/p replacement of perc nephrostomy tube  - monitor I/O  Acute diastolic CHF - Echocardiogram on 08/30/2017 shows grade 1 diastolic dysfunction. EF 60-65%. - keep on lasix IV 40  mg QD - daily weights, I/O  Hypokalemia and hypomagnesemia - supplement and repeat BMP, Mg in AM  DLBCL. - chemo on hold for now  - onc following   Severe protein calorie malnutrition - continue supplementation   Pancytopenia  - suspect chemotx induced  - Oncology recommends to continue on G-CSF on a daily basis until ANC > 1500 - 09/16/2017 ANC is 3600 - Discontinued G-CSF - Hg overall stable - Plt still low but improving - CBC in AM  Constipation - keep on bowel regimen   Rheumatoid arthritis. - On methotrexate in the past currently not on any medication.  Morbid obesity  - Body mass index is 47 kg/m.  DVT prophylaxis: SCD's Code Status: Full Family Communication: Patient and sister at bedside  Disposition Plan: to be determined but when medically stable, will go to SNF  Consultants:   Onc   PCCM   Procedures:   ECHO   Antimicrobials:   Vanc and Zosyn completed   Subjective: Still with exertional dyspnea as well as dyspnea at rest.   Objective: Vitals:   09/18/17 2202 09/19/17 0126 09/19/17 0646 09/19/17 0744  BP: (!) 106/49  (!) 104/54   Pulse: 95  92   Resp: 20  20   Temp: 97.9 F (36.6 C)  98.1 F (36.7 C)   TempSrc: Oral  Oral   SpO2: 93% 93% 94% 94%  Weight:   102 kg (224 lb 13.9 oz)   Height:        Intake/Output Summary (Last 24 hours) at 09/19/17  1328 Last data filed at 09/19/17 1000  Gross per 24 hour  Intake              245 ml  Output             3000 ml  Net            -2755 ml   Filed Weights   09/18/17 0610 09/18/17 1200 09/19/17 0646  Weight: 47.5 kg (104 lb 11.2 oz) 102.5 kg (225 lb 15.5 oz) 102 kg (224 lb 13.9 oz)    Examination:  General exam: Appears to be somewhat dyspneic but overall stable, NAD  Respiratory system: Mild tachypnea with diminished breath sounds at bases and crackles at bases  Cardiovascular system: S1 & S2 heard, RRR. No JVD, murmurs, rubs, gallops or clicks. No pedal  edema. Gastrointestinal system: Abdomen is nondistended, soft and nontender. No organomegaly or masses felt. Normal bowel sounds heard. Central nervous system: Alert and oriented. No focal neurological deficits. Extremities: Symmetric 5 x 5 power. Skin: No rashes, lesions or ulcers Psychiatry: Judgement and insight appear normal. Mood & affect appropriate.   Data Reviewed: I have personally reviewed following labs and imaging studies  CBC:  Recent Labs Lab 09/13/17 0415 09/14/17 0303  09/15/17 0500 09/16/17 0431 09/17/17 0030 09/18/17 0351 09/19/17 0352  WBC 1.1* 1.4*  --  3.2* 6.3 14.7* 22.5* 24.2*  NEUTROABS 0.6* 0.5*  --  0.6* 3.5 9.4*  --   --   HGB 7.7* 7.7*  < > 9.6* 9.3* 9.7* 8.6* 8.1*  HCT 22.3* 22.8*  < > 27.8* 27.5* 28.5* 25.6* 23.9*  MCV 93.7 94.6  --  90.6 91.1 90.2 91.1 93.0  PLT 48* 50*  --  52* 57* 63* 63* 65*  < > = values in this interval not displayed. Basic Metabolic Panel:  Recent Labs Lab 09/13/17 0415 09/14/17 0303 09/15/17 0500 09/16/17 0431 09/17/17 0030 09/18/17 0351 09/19/17 0352  NA 136 134* 136 138 136 137 138  K 3.5 3.3* 3.0* 3.2* 3.1* 2.7* 3.0*  CL 109 108 111 111 108 107 107  CO2 22 20* 20* 21* 21* 24 25  GLUCOSE 139* 151* 113* 118* 148* 138* 129*  BUN 32* 36* 38* 36* 39* 35* 34*  CREATININE 0.71  0.70 0.80  0.82 0.87 0.90 0.86 0.78 0.65  CALCIUM 8.8* 8.4* 8.2* 8.0* 7.7* 7.5* 7.3*  MG 1.7 1.7 1.7 1.6* 1.8 1.6*  --   PHOS 4.5 3.9  --   --   --   --   --    Liver Function Tests:  Recent Labs Lab 09/13/17 0415 09/14/17 0303 09/15/17 0500 09/16/17 0431  AST  --  21 20 22   ALT  --  30 30 32  ALKPHOS  --  56 53 59  BILITOT  --  1.5* 1.7* 1.7*  PROT  --  <3.0* 3.0* 3.2*  ALBUMIN 1.5* 1.5* 1.6* 1.7*   Coagulation Profile:  Recent Labs Lab 09/15/17 0535  INR 1.23   CBG:  Recent Labs Lab 09/19/17 1132  GLUCAP 145*   Urine analysis:    Component Value Date/Time   COLORURINE YELLOW 09/11/2017 1650   APPEARANCEUR  TURBID (A) 09/11/2017 1650   LABSPEC 1.018 09/11/2017 1650   PHURINE 5.0 09/11/2017 1650   GLUCOSEU NEGATIVE 09/11/2017 1650   HGBUR LARGE (A) 09/11/2017 1650   BILIRUBINUR NEGATIVE 09/11/2017 1650   KETONESUR NEGATIVE 09/11/2017 1650   PROTEINUR NEGATIVE 09/11/2017 1650   NITRITE NEGATIVE 09/11/2017 1650  LEUKOCYTESUR TRACE (A) 09/11/2017 1650   Recent Results (from the past 240 hour(s))  MRSA PCR Screening     Status: None   Collection Time: 09/11/17  1:29 PM  Result Value Ref Range Status   MRSA by PCR NEGATIVE NEGATIVE Final    Comment:        The GeneXpert MRSA Assay (FDA approved for NASAL specimens only), is one component of a comprehensive MRSA colonization surveillance program. It is not intended to diagnose MRSA infection nor to guide or monitor treatment for MRSA infections.   Urine culture     Status: None   Collection Time: 09/11/17  4:50 PM  Result Value Ref Range Status   Specimen Description URINE, CATHETERIZED  Final   Special Requests NONE  Final   Culture   Final    NO GROWTH Performed at [facility], [address] [place], [place] [address]    Report Status 09/12/2017 FINAL  Final  Culture, blood (routine x 2)     Status: None   Collection Time: 09/11/17 11:35 PM  Result Value Ref Range Status   Specimen Description BLOOD LEFT HAND  Final   Special Requests IN PEDIATRIC BOTTLE Blood Culture adequate volume  Final   Culture   Final    NO GROWTH 5 DAYS Performed at [facility], [place] [address], [place], [place] [address]    Report Status 09/17/2017 FINAL  Final  Culture, blood (routine x 2)     Status: None   Collection Time: 09/11/17 11:35 PM  Result Value Ref Range Status   Specimen Description BLOOD RIGHT HAND  Final   Special Requests IN PEDIATRIC BOTTLE Blood Culture adequate volume  Final   Culture   Final    NO GROWTH 5 DAYS Performed at [facility], [place] [address], [place], [place] [address]    Report Status  09/17/2017 FINAL  Final  Respiratory Panel by PCR     Status: Abnormal   Collection Time: 09/18/17  9:54 AM  Result Value Ref Range Status   Adenovirus NOT DETECTED NOT DETECTED Final   Coronavirus 229E NOT DETECTED NOT DETECTED Final   Coronavirus HKU1 NOT DETECTED NOT DETECTED Final   Coronavirus NL63 NOT DETECTED NOT DETECTED Final   Coronavirus OC43 NOT DETECTED NOT DETECTED Final   Metapneumovirus NOT DETECTED NOT DETECTED Final   Rhinovirus / Enterovirus NOT DETECTED NOT DETECTED Final   Influenza A NOT DETECTED NOT DETECTED Final   Influenza B NOT DETECTED NOT DETECTED Final   Parainfluenza Virus 1 NOT DETECTED NOT DETECTED Final   Parainfluenza Virus 2 DETECTED (A) NOT DETECTED Final   Parainfluenza Virus 3 NOT DETECTED NOT DETECTED Final   Parainfluenza Virus 4 NOT DETECTED NOT DETECTED Final   Respiratory Syncytial Virus NOT DETECTED NOT DETECTED Final   Bordetella pertussis NOT DETECTED NOT DETECTED Final   Chlamydophila pneumoniae NOT DETECTED NOT DETECTED Final   Mycoplasma pneumoniae NOT DETECTED NOT DETECTED Final    Comment: Performed at [facility], [place] [address], [place], [place] [address]    Radiology Studies: Dg Chest Port 1 View  Result Date: 09/18/2017 CLINICAL DATA:  Shortness of breath. EXAM: PORTABLE CHEST 1 VIEW COMPARISON:  09/10/2017 FINDINGS: Power port appears in good position. Tip is approximately 2 cm below the carina in good position. Since the prior study the patient has developed bilateral pleural effusions, left greater than right. Pulmonary vascularity appears normal. Heart size is normal. No  acute bone abnormality. IMPRESSION: New bilateral pleural effusions. Small on the right and moderate on the left. Electronically Signed   By: Lorriane Shire M.D.   On: 09/18/2017 08:51    Scheduled Meds: . albuterol  2.5 mg Nebulization Q6H  . benzonatate  100 mg Oral TID  . Chlorhexidine Gluconate Cloth  6 each Topical q morning - 10a  . furosemide   40 mg Intravenous Daily  . hydrocortisone sod succinate (SOLU-CORTEF) inj  50 mg Intravenous Daily  . pantoprazole  40 mg Oral Daily  . potassium chloride  40 mEq Oral BID  . protein supplement shake  2 oz Oral QID   Continuous Infusions:   LOS: 8 days   Time spent: 35 minutes   Faye Ramsay, MD [facility] Pager [phone]  If 7PM-7AM, please contact night-coverage [email or web address] Password [email or web address] 09/19/2017, 1:28 PM

## 2017-09-19 NOTE — Care Management Important Message (Signed)
Important Message  Patient Details  Name: Kristina Torres MRN: [id number] Date of Birth: 1946-03-23   Medicare Important Message Given:  Yes    Kerin Salen 09/19/2017, 12:08 [place] Message  Patient Details  Name: Kristina Torres MRN: [id number] Date of Birth: [birth year]   Medicare Important Message Given:  Yes    Kerin Salen 09/19/2017, 12:08 PM

## 2017-09-19 NOTE — Progress Notes (Signed)
Physical Therapy Treatment Patient Details Name: Kristina Torres MRN: [id number] DOB: 25-Feb-1946 Today's Date: 09/19/2017    History of Present Illness 71 y.o. Female with history of multiple medical problems including rheumatoid arthritis, hyperlipidemia, history of hypertension, and diffuse large B-cell lymphoma. Patient recently hospitalized with right-sided hydronephrosis status post percutaneous nephrostomy tube and exchange on 9/14 after tube malfunction. Patient started on chemotherapy with Cytoxan and vincristine on 9/20 and subsequently discharged to rehabilitation on 9/22. Patient's tube was fractured at her nursing facility and returned to hospital and was found to be hypotensive with borderline blood pressure for at least 2 weeks. PMH: HTN, HLD, gout, RA    PT Comments    Pt pleasant and very agreeable to mobilize.  Pt attempted standing however unable to safely stand fully erect today.  Pt assisted with lateral /scoot transfer over to drop arm recliner.  Pt performed exercises once settled in recliner as well.  Pt able to progress to out of bed today and tolerated 31 minute session well.  Continue to recommend SNF.   Follow Up Recommendations  SNF     Equipment Recommendations  None recommended by PT    Recommendations for Other Services       Precautions / Restrictions Precautions Precautions: Fall Precaution Comments: right side nephrostomy tube, Port, LEs with pitting edema    Mobility  Bed Mobility Overal bed mobility: Needs Assistance Bed Mobility: Supine to Sit     Supine to sit: Max assist     General bed mobility comments: pt assisted with bringing LEs to EOB, utilized bed pad to scoot to EOB, assist for trunk upright, pt engaged and attempting to assist as able  Transfers Overall transfer level: Needs assistance Equipment used: None Transfers: Sit to/from Stand;Lateral/Scoot Transfers Sit to Stand: Max assist;From elevated surface;+2 physical  assistance        Lateral/Scoot Transfers: Max assist;+2 physical assistance General transfer comment: pt assisted with sit to stand with blocking knees and utilizing bed pad to boost pt upright, pt unable to tolerate standing so assisted back to sitting, then performed lateral/scoot transfer to assist pt over to drop arm recliner, cues for unweighting hips, again utilized bed pad to assist pt  Ambulation/Gait                 Stairs            Wheelchair Mobility    Modified Rankin (Stroke Patients Only)       Balance Overall balance assessment: Needs assistance Sitting-balance support: No upper extremity supported;Feet supported   Sitting balance - Comments: pt able to maintain upright posture without UE or external support today   Standing balance support: Bilateral upper extremity supported Standing balance-Leahy Scale: Zero Standing balance comment: unable to stand fully erect                            Cognition Arousal/Alertness: Awake/alert Behavior During Therapy: WFL for tasks assessed/performed Overall Cognitive Status: Within Functional Limits for tasks assessed                                        Exercises General Exercises - Lower Extremity Ankle Circles/Pumps: AROM;Both;10 reps Quad Sets: AROM;Both;10 reps Long Arc Quad: AROM;Both;10 reps;Seated Heel Slides: AAROM;Both;10 reps Hip ABduction/ADduction: AAROM;Both;10 reps    General Comments  Pertinent Vitals/Pain Pain Assessment: 0-10 Pain Score: 7  Pain Location: L flank Pain Descriptors / Indicators: Cramping Pain Intervention(s): Repositioned;Monitored during session    Home Living                      Prior Function            PT Goals (current goals can now be found in the care plan section) Progress towards PT goals: Progressing toward goals    Frequency    Min 2X/week      PT Plan Current plan remains appropriate     Co-evaluation              AM-PAC PT "6 Clicks" Daily Activity  Outcome Measure  Difficulty turning over in bed (including adjusting bedclothes, sheets and blankets)?: Unable Difficulty moving from lying on back to sitting on the side of the bed? : Unable Difficulty sitting down on and standing up from a chair with arms (e.g., wheelchair, bedside commode, etc,.)?: Unable Help needed moving to and from a bed to chair (including a wheelchair)?: Total Help needed walking in hospital room?: Total Help needed climbing 3-5 steps with a railing? : Total 6 Click Score: 6    End of Session Equipment Utilized During Treatment: Gait belt Activity Tolerance: Patient tolerated treatment well Patient left: in chair;with call bell/phone within reach;with family/visitor present Nurse Communication: Mobility status (discussed safe transfer for pt to return to bed) PT Visit Diagnosis: Muscle weakness (generalized) (M62.81);Other abnormalities of gait and mobility (R26.89)     Time: 1535-1606 PT Time Calculation (min) (ACUTE ONLY): 31 min  Charges:  $Therapeutic Exercise: 8-22 mins $Therapeutic Activity: 8-22 mins                    G Codes:       Carmelia Bake, PT, DPT 09/19/2017 Pager: [phone]  York Ram E 09/19/2017, 5:34 PM

## 2017-09-19 NOTE — Progress Notes (Signed)
CSW following for disposition. Pt was admitted from [facility], where she went for rehab following last hospitalization, and returned to hospital after 3 days in SNF. Pt/husband report they will not have pt return to [facility] as they feel her nephrostomy tube was not well managed there.  Husband had requested [facility] or [facility], CSW made referral and neither able to offer acceptance. Discussed with pt and husband- agreed to referrals to many area SNFs in order to investigate options.  Will continue following.  Sharren Bridge, MSW, LCSW Clinical Social Work 09/19/2017 [phone]

## 2017-09-20 ENCOUNTER — Encounter (HOSPITAL_COMMUNITY): Payer: Self-pay

## 2017-09-20 ENCOUNTER — Inpatient Hospital Stay (HOSPITAL_COMMUNITY): Payer: Medicare Other

## 2017-09-20 DIAGNOSIS — T451X5A Adverse effect of antineoplastic and immunosuppressive drugs, initial encounter: Secondary | ICD-10-CM

## 2017-09-20 HISTORY — PX: THORACENTESIS: SHX235

## 2017-09-20 LAB — ALBUMIN, PLEURAL OR PERITONEAL FLUID: Albumin, Fluid: <1 g/dL

## 2017-09-20 LAB — GRAM STAIN

## 2017-09-20 LAB — CBC
HCT: 24.6 % — ABNORMAL LOW (ref 36.0–46.0)
Hemoglobin: 8.3 g/dL — ABNORMAL LOW (ref 12.0–15.0)
MCH: 31.2 pg (ref 26.0–34.0)
MCHC: 33.7 g/dL (ref 30.0–36.0)
MCV: 92.5 fL (ref 78.0–100.0)
PLATELETS: 64 10*3/uL — AB (ref 150–400)
RBC: 2.66 MIL/uL — AB (ref 3.87–5.11)
RDW: 22.9 % — ABNORMAL HIGH (ref 11.5–15.5)
WBC: 20.4 10*3/uL — ABNORMAL HIGH (ref 4.0–10.5)

## 2017-09-20 LAB — GLUCOSE, PLEURAL OR PERITONEAL FLUID: GLUCOSE FL: 122 mg/dL

## 2017-09-20 LAB — BASIC METABOLIC PANEL
ANION GAP: 5 (ref 5–15)
BUN: 32 mg/dL — ABNORMAL HIGH (ref 6–20)
CALCIUM: 7.3 mg/dL — AB (ref 8.9–10.3)
CO2: 27 mmol/L (ref 22–32)
CREATININE: 0.57 mg/dL (ref 0.44–1.00)
Chloride: 107 mmol/L (ref 101–111)
Glucose, Bld: 113 mg/dL — ABNORMAL HIGH (ref 65–99)
Potassium: 3.6 mmol/L (ref 3.5–5.1)
Sodium: 139 mmol/L (ref 135–145)

## 2017-09-20 LAB — BODY FLUID CELL COUNT WITH DIFFERENTIAL
EOS FL: 0 %
LYMPHS FL: 70 %
Monocyte-Macrophage-Serous Fluid: 10 % — ABNORMAL LOW (ref 50–90)
NEUTROPHIL FLUID: 20 % (ref 0–25)
Total Nucleated Cell Count, Fluid: 316 cu mm (ref 0–1000)

## 2017-09-20 LAB — LACTATE DEHYDROGENASE, PLEURAL OR PERITONEAL FLUID: LD FL: 71 U/L — AB (ref 3–23)

## 2017-09-20 LAB — PROTEIN, PLEURAL OR PERITONEAL FLUID: Total protein, fluid: <3 g/dL

## 2017-09-20 LAB — AMYLASE, PLEURAL OR PERITONEAL FLUID: AMYLASE FL: 20 U/L

## 2017-09-20 LAB — MAGNESIUM: MAGNESIUM: 2 mg/dL (ref 1.7–2.4)

## 2017-09-20 MED ORDER — LIDOCAINE HCL 2 % IJ SOLN
INTRAMUSCULAR | Status: AC
  Filled 2017-09-20: qty 10

## 2017-09-20 MED ORDER — ALBUTEROL SULFATE (2.5 MG/3ML) 0.083% IN NEBU
2.5000 mg | INHALATION_SOLUTION | Freq: Three times a day (TID) | RESPIRATORY_TRACT | Status: DC
  Administered 2017-09-21 – 2017-09-25 (×8): 2.5 mg via RESPIRATORY_TRACT
  Filled 2017-09-20 (×16): qty 3

## 2017-09-20 NOTE — Procedures (Signed)
Ultrasound-guided diagnostic and therapeutic left  thoracentesis performed yielding 580 cc of light yellow fluid. No immediate complications. Follow-up chest x-ray pending. The fluid was submitted to the laboratory for preordered studies.

## 2017-09-20 NOTE — Progress Notes (Addendum)
Patient ID: Kristina Torres, female   DOB: 1946/10/24, 71 y.o.   MRN: [id number]    PROGRESS NOTE  Jonte Wollam  MCN:[id number] DOB: 1946/04/30 DOA: 09/10/2017  PCP: [name], MD   Brief Narrative:  Pt is 71 yo female with known DLBCL (started on chemo Cytoxan and Vincristine on 9/20), RA, HLD, HTN, recent hospitalization for right sided hydronephrosis and is now s/p perc nephrostomy tube placement and exchange on 08/31/2017 after tube malfunction, presented with weakness, found to be hypotensive with presumptive sepsis. In ED, pt has received boluses of IV NS and was admitted for further evaluation. She has initially required vasopressor support but now off of it and SBP still in 100's.   Assessment & Plan: Hypotension - suspected distributive shock,has been off pressors since 09/14/2017 - SBP remains overall on low end of normal but stable  - blood cultures with no growth to date - Pt has received total 5 days of Vancomycin and Zosyn, ABX stopped as no clear infectious etiology was identified   Acute respiratory distress - suspect acute diastolic CHF, bilateral pleural effusions, parainfluenza virus   - I have reviewed CT chest from 9/16 where bilateral pleural effusions have been identified and I suspect this has been getting worse and thus contributing to worsening dyspnea - CXR also reviewed with pt and family at bedside - repeat CT chest 10/03 with enlarging pleural effusions - also per CT chest, new patchy areas of mid to upper lung predominant ground-glass attenuation and interlobular septal thickening. This is nonspecific, but given the normal cardiac size, this does not appear to reflect cardiogenic pulmonary edema. Noncardiogenic edema, alveolar hemorrhage and/or acute atypical infection are the primary differential considerations - pt has completed course of ABX so not convincing for acute PNA, if pt not better post thoracentesis, can consider PCCM input  - IR consulted  for tap, pt is now s/p left thoracentesis 580 cc fluid removed and sent for analysis  - continue Lasix 40 mg IV QD - monitor daily weights, I/O - weight trend in the past 72 hours  Filed Weights   09/18/17 1200 09/19/17 0646 09/20/17 0630  Weight: 102.5 kg (225 lb 15.5 oz) 102 kg (224 lb 13.9 oz) 102 kg (224 lb 13.9 oz)   Right sided hydronephrosis - s/p replacement of perc nephrostomy tube  - monitor I/O - family asked to see Dr.Otelin if possible while inpatient - will send message via EPIC   Acute diastolic CHF - Echocardiogram on 08/30/2017 shows grade 1 diastolic dysfunction. EF 60-65%. - keep on lasix IV 40 mg QD - see weight trend above   Hypokalemia and hypomagnesemia - supplemented and WNL this AM   DLBCL. - chemo on hold for now  - per Ct chest, regression of previously noted lymphadenopathy indicative of a positive response to therapy.  - onc following   Severe protein calorie malnutrition - continue supplementation   Pancytopenia  - suspect chemotx induced  - Oncology recommends to continue on G-CSF on a daily basis until ANC > 1500 - 09/16/2017 ANC 3600 - Discontinued G-CSF - Hg overall stable - plt slowly improving - no evidence of active bleeding  - CBC in AM  Constipation - cont bowel regimen   Rheumatoid arthritis. - On methotrexate in the past currently not on any medication.  Morbid obesity  - Body mass index is 47 kg/m.  DVT prophylaxis: SCD's Code Status: Full Family Communication: pt and family at bedside  Disposition Plan: to be determined  but when medically stable, will go to SNF  Consultants:   Onc   PCCM   Procedures:   ECHO   Antimicrobials:   Vanc and Zosyn completed   Subjective: Still with exertional dyspnea.   Objective: Vitals:   09/20/17 0630 09/20/17 0920 09/20/17 1241 09/20/17 1512  BP: (!) 93/42  (!) 95/53 (!) 109/49  Pulse: (!) 102   (!) 107  Resp: (!) 22     Temp: 98.1 F (36.7 C)   98.1 F (36.7  C)  TempSrc: Oral   Oral  SpO2: 97% 97%  95%  Weight: 102 kg (224 lb 13.9 oz)     Height:        Intake/Output Summary (Last 24 hours) at 09/20/17 1743 Last data filed at 09/20/17 1516  Gross per 24 hour  Intake              360 ml  Output             2351 ml  Net            -1991 ml   Filed Weights   09/18/17 1200 09/19/17 0646 09/20/17 0630  Weight: 102.5 kg (225 lb 15.5 oz) 102 kg (224 lb 13.9 oz) 102 kg (224 lb 13.9 oz)   Physical Exam  Constitutional: Appears calm ,NAD CVS: tachycardia, no murmurs, no gallops, no carotid bruit.  Pulmonary: Effort and breath sounds normal, no stridor, rhonchi, wheezes, rales.  Abdominal: Soft. BS +,  no distension, tenderness, rebound or guarding.  Neuro: Alert. Normal reflexes, muscle tone coordination. No cranial nerve deficit. Skin: Skin is warm and dry. No rash noted. Not diaphoretic. No erythema. No pallor.   Data Reviewed: I have personally reviewed following labs and imaging studies  CBC:  Recent Labs Lab 09/14/17 0303  09/15/17 0500 09/16/17 0431 09/17/17 0030 09/18/17 0351 09/19/17 0352 09/20/17 0409  WBC 1.4*  --  3.2* 6.3 14.7* 22.5* 24.2* 20.4*  NEUTROABS 0.5*  --  0.6* 3.5 9.4*  --   --   --   HGB 7.7*  < > 9.6* 9.3* 9.7* 8.6* 8.1* 8.3*  HCT 22.8*  < > 27.8* 27.5* 28.5* 25.6* 23.9* 24.6*  MCV 94.6  --  90.6 91.1 90.2 91.1 93.0 92.5  PLT 50*  --  52* 57* 63* 63* 65* 64*  < > = values in this interval not displayed. Basic Metabolic Panel:  Recent Labs Lab 09/14/17 0303  09/16/17 0431 09/17/17 0030 09/18/17 0351 09/19/17 0352 09/20/17 0409  NA 134*  < > 138 136 137 138 139  K 3.3*  < > 3.2* 3.1* 2.7* 3.0* 3.6  CL 108  < > 111 108 107 107 107  CO2 20*  < > 21* 21* 24 25 27   GLUCOSE 151*  < > 118* 148* 138* 129* 113*  BUN 36*  < > 36* 39* 35* 34* 32*  CREATININE 0.80  0.82  < > 0.90 0.86 0.78 0.65 0.57  CALCIUM 8.4*  < > 8.0* 7.7* 7.5* 7.3* 7.3*  MG 1.7  < > 1.6* 1.8 1.6* 1.8 2.0  PHOS 3.9  --   --   --    --   --   --   < > = values in this interval not displayed. Liver Function Tests:  Recent Labs Lab 09/14/17 0303 09/15/17 0500 09/16/17 0431  AST 21 20 22   ALT 30 30 32  ALKPHOS 56 53 59  BILITOT 1.5* 1.7* 1.7*  PROT <3.0*  3.0* 3.2*  ALBUMIN 1.5* 1.6* 1.7*   Coagulation Profile:  Recent Labs Lab 09/15/17 0535  INR 1.23   CBG:  Recent Labs Lab 09/19/17 1132  GLUCAP 145*   Urine analysis:    Component Value Date/Time   COLORURINE YELLOW 09/11/2017 1650   APPEARANCEUR TURBID (A) 09/11/2017 1650   LABSPEC 1.018 09/11/2017 1650   PHURINE 5.0 09/11/2017 1650   GLUCOSEU NEGATIVE 09/11/2017 1650   HGBUR LARGE (A) 09/11/2017 1650   BILIRUBINUR NEGATIVE 09/11/2017 1650   KETONESUR NEGATIVE 09/11/2017 1650   PROTEINUR NEGATIVE 09/11/2017 1650   NITRITE NEGATIVE 09/11/2017 1650   LEUKOCYTESUR TRACE (A) 09/11/2017 1650   Recent Results (from the past 240 hour(s))  MRSA PCR Screening     Status: None   Collection Time: 09/11/17  1:29 PM  Result Value Ref Range Status   MRSA by PCR NEGATIVE NEGATIVE Final    Comment:        The GeneXpert MRSA Assay (FDA approved for NASAL specimens only), is one component of a comprehensive MRSA colonization surveillance program. It is not intended to diagnose MRSA infection nor to guide or monitor treatment for MRSA infections.   Urine culture     Status: None   Collection Time: 09/11/17  4:50 PM  Result Value Ref Range Status   Specimen Description URINE, CATHETERIZED  Final   Special Requests NONE  Final   Culture   Final    NO GROWTH Performed at [facility], [address] [place], [place] [address]    Report Status 09/12/2017 FINAL  Final  Culture, blood (routine x 2)     Status: None   Collection Time: 09/11/17 11:35 PM  Result Value Ref Range Status   Specimen Description BLOOD LEFT HAND  Final   Special Requests IN PEDIATRIC BOTTLE Blood Culture adequate volume  Final   Culture   Final    NO GROWTH 5  DAYS Performed at [facility], [place] [address] [place], [place] [address]    Report Status 09/17/2017 FINAL  Final  Culture, blood (routine x 2)     Status: None   Collection Time: 09/11/17 11:35 PM  Result Value Ref Range Status   Specimen Description BLOOD RIGHT HAND  Final   Special Requests IN PEDIATRIC BOTTLE Blood Culture adequate volume  Final   Culture   Final    NO GROWTH 5 DAYS Performed at [facility], [place] [address], [place], [place] [address]    Report Status 09/17/2017 FINAL  Final  Respiratory Panel by PCR     Status: Abnormal   Collection Time: 09/18/17  9:54 AM  Result Value Ref Range Status   Adenovirus NOT DETECTED NOT DETECTED Final   Coronavirus 229E NOT DETECTED NOT DETECTED Final   Coronavirus HKU1 NOT DETECTED NOT DETECTED Final   Coronavirus NL63 NOT DETECTED NOT DETECTED Final   Coronavirus OC43 NOT DETECTED NOT DETECTED Final   Metapneumovirus NOT DETECTED NOT DETECTED Final   Rhinovirus / Enterovirus NOT DETECTED NOT DETECTED Final   Influenza A NOT DETECTED NOT DETECTED Final   Influenza B NOT DETECTED NOT DETECTED Final   Parainfluenza Virus 1 NOT DETECTED NOT DETECTED Final   Parainfluenza Virus 2 DETECTED (A) NOT DETECTED Final   Parainfluenza Virus 3 NOT DETECTED NOT DETECTED Final   Parainfluenza Virus 4 NOT DETECTED NOT DETECTED Final   Respiratory Syncytial Virus NOT DETECTED NOT DETECTED Final   Bordetella pertussis NOT DETECTED NOT DETECTED  Final   Chlamydophila pneumoniae NOT DETECTED NOT DETECTED Final   Mycoplasma pneumoniae NOT DETECTED NOT DETECTED Final    Comment: Performed at [facility], [place] [address], [place], [place] [address]  Gram stain     Status: None   Collection Time: 09/20/17  3:54 PM  Result Value Ref Range Status   Specimen Description FLUID LEFT PLEURAL  Final   Special Requests NONE  Final   Gram Stain   Final    FEW WBC PRESENT,BOTH PMN AND MONONUCLEAR NO ORGANISMS SEEN Performed at [facility], [address] [place],  [address]    Report Status 09/20/2017 FINAL  Final    Radiology Studies: Ct Chest Wo Contrast Result Date: 09/19/2017 1. Regression of previously noted lymphadenopathy indicative of a positive response to therapy.  2. New patchy areas of mid to upper lung predominant ground-glass attenuation and interlobular septal thickening. This is nonspecific, but given the normal cardiac size, this does not appear to reflect cardiogenic pulmonary edema. Noncardiogenic edema, alveolar hemorrhage and/or acute atypical infection are the primary differential considerations.  3. Increasing moderate bilateral pleural effusions, large volume of ascites, and diffuse body wall edema, suggestive of a state of underlying anasarca.  4. Aortic atherosclerosis, in addition to left anterior descending coronary artery disease. Assessment for potential risk factor modification, dietary therapy or pharmacologic therapy may be warranted, if clinically indicated.  5. Mild diffuse bronchial wall thickening with mild centrilobular and paraseptal emphysema; imaging findings suggestive of underlying COPD.    Dg Chest Port 1 View Result Date: 09/20/2017 Result Date: 09/20/2017 Successful ultrasound guided diagnostic and therapeutic left thoracentesis yielding 580 cc of pleural fluid.  Scheduled Meds: . albuterol  2.5 mg Nebulization Q6H  . benzonatate  100 mg Oral TID  . Chlorhexidine Gluconate Cloth  6 each Topical q morning - 10a  . furosemide  40 mg Intravenous Daily  . hydrocortisone sod succinate (SOLU-CORTEF) inj  50 mg Intravenous Daily  . lidocaine      . lidocaine      . pantoprazole  40 mg Oral Daily  . potassium chloride  40 mEq Oral BID  . protein supplement shake  2 oz Oral QID   Continuous Infusions:   LOS: 9 days   Time spent: 25 minutes   Faye Ramsay, MD [facility] Pager [phone]  If 7PM-7AM, please contact  night-coverage [email or web address] Password [email or web address] 09/20/2017, 5:43 PM

## 2017-09-21 DIAGNOSIS — T83022A Displacement of nephrostomy catheter, initial encounter: Secondary | ICD-10-CM

## 2017-09-21 LAB — BASIC METABOLIC PANEL
ANION GAP: 3 — AB (ref 5–15)
BUN: 33 mg/dL — ABNORMAL HIGH (ref 6–20)
CALCIUM: 7.2 mg/dL — AB (ref 8.9–10.3)
CHLORIDE: 107 mmol/L (ref 101–111)
CO2: 28 mmol/L (ref 22–32)
Creatinine, Ser: 0.48 mg/dL (ref 0.44–1.00)
GFR calc Af Amer: >60 mL/min (ref >60)
GFR calc non Af Amer: >60 mL/min (ref >60)
GLUCOSE: 97 mg/dL (ref 65–99)
Potassium: 4 mmol/L (ref 3.5–5.1)
Sodium: 138 mmol/L (ref 135–145)

## 2017-09-21 LAB — CBC
HCT: 24.2 % — ABNORMAL LOW (ref 36.0–46.0)
HEMOGLOBIN: 8 g/dL — AB (ref 12.0–15.0)
MCH: 31.5 pg (ref 26.0–34.0)
MCHC: 33.1 g/dL (ref 30.0–36.0)
MCV: 95.3 fL (ref 78.0–100.0)
Platelets: 65 10*3/uL — ABNORMAL LOW (ref 150–400)
RBC: 2.54 MIL/uL — ABNORMAL LOW (ref 3.87–5.11)
RDW: 23.1 % — ABNORMAL HIGH (ref 11.5–15.5)
WBC: 17 10*3/uL — ABNORMAL HIGH (ref 4.0–10.5)

## 2017-09-21 LAB — PH, BODY FLUID: PH, BODY FLUID: 7.6

## 2017-09-21 NOTE — Progress Notes (Signed)
CSW following for discharge to SNF. 

## 2017-09-21 NOTE — Progress Notes (Signed)
Patient ID: Mauriah Mcmillen, female   DOB: Aug 12, 1946, 71 y.o.   MRN: [id number]    PROGRESS NOTE  Anusha Claus  TFT:[id number] DOB: 07-26-1946 DOA: 09/10/2017  PCP: [name], MD   Brief Narrative:  Pt is 71 yo female with known DLBCL (started on chemo Cytoxan and Vincristine on 9/20), RA, HLD, HTN, recent hospitalization for right sided hydronephrosis and is now s/p perc nephrostomy tube placement and exchange on 08/31/2017 after tube malfunction, presented with weakness, found to be hypotensive with presumptive sepsis. In ED, pt has received boluses of IV NS and was admitted for further evaluation. She has initially required vasopressor support but now off of it and SBP still in 100's.   Assessment & Plan: Hypotension: suspected distributive shock, third-spacing IVF's administered earlier this hospitalization. Has been off pressors since 09/14/2017 - SBP remains low, limiting diuresis. Consider lasix + albumin.  - No evidence of sepsis at this time. Blood cultures NGTD s/p 5 days abx.   Acute respiratory distress: Suspect acute diastolic CHF, bilateral pleural effusions, parainfluenza virus.  - Improved with thoracentesis 10/4, 580cc transudate. If worsens, will consider repeat.  - CT chest also showed new patchy areas of mid to upper lung predominant ground-glass attenuation and interlobular septal thickening. This is nonspecific, but given the normal cardiac size, this does not appear to reflect cardiogenic pulmonary edema. Noncardiogenic edema, alveolar hemorrhage and/or acute atypical infection are the primary differential considerations - Consider PCCM input  - Continue lasix 40 mg IV QD, weight trend flat.   Right-sided hydronephrosis: s/p replacement of perc nephrostomy tube  - Sent EPIC message with Dr. Karsten Ro 10/4 per family's request, though no urgent need for consultation.   Acute diastolic CHF - Echocardiogram on 08/30/2017 shows grade 1 diastolic dysfunction. EF  60-65%. - keep on lasix IV 40 mg QD  Hypokalemia and hypomagnesemia: - Supplemented, improved.   DLBCL: Incidentally noted regression of lymphadenopathy on CT chest after 1st cycle of chemotherapy.  - Appreciate heme/onc assistance. Chemo on hold for now.  Severe protein calorie malnutrition - continue supplementation. Discussed that this is a significant predictor of her clinical course.  Pancytopenia: Suspect chemotx induced, improving s/p G-CSF - Appreciate oncology recommendations.   Constipation - cont bowel regimen   Rheumatoid arthritis. - On methotrexate in the past currently not on any medication.  Morbid obesity  - Body mass index is 47 kg/m.  DVT prophylaxis: SCD's Code Status: Full Family Communication: Sister at bedside Disposition Plan: to be determined but when medically stable, will go to SNF  Consultants:   Onc   PCCM   Procedures:   ECHO   Antimicrobials:   Vanc and Zosyn x5 days   Subjective: Dyspnea much better. No chest pain. Still with swelling in legs, though this is better.   Objective: Vitals:   09/21/17 1000 09/21/17 1200 09/21/17 1424 09/21/17 2024  BP: (!) 88/40 (!) 92/51 (!) 103/54 (!) 89/54  Pulse:   (!) 106 99  Resp: 16 16 18 18   Temp:   98.4 F (36.9 C) 98 F (36.7 C)  TempSrc:   Oral Oral  SpO2:   99% 99%  Weight:      Height:        Intake/Output Summary (Last 24 hours) at 09/21/17 2039 Last data filed at 09/21/17 1900  Gross per 24 hour  Intake              250 ml  Output  3250 ml  Net            -3000 ml   Filed Weights   09/19/17 0646 09/20/17 0630 09/21/17 0504  Weight: 102 kg (224 lb 13.9 oz) 102 kg (224 lb 13.9 oz) 101.2 kg (223 lb 1.7 oz)   Physical Exam  Constitutional: Appears calm ,NAD CVS: tachycardia, no murmurs, no gallops, no carotid bruit.  Pulmonary: Effort and breath sounds normal, decreased at bases.  Abdominal: Soft. BS +,  no distension, tenderness, rebound or guarding.    Neuro: Alert. Normal reflexes, muscle tone coordination. No cranial nerve deficit. Skin: Skin is warm and dry. No rash noted. Not diaphoretic. No erythema. No pallor. Posterior thoracentesis punctum c/d/i.  Data Reviewed: I have personally reviewed following labs and imaging studies  CBC:  Recent Labs Lab 09/15/17 0500 09/16/17 0431 09/17/17 0030 09/18/17 0351 09/19/17 0352 09/20/17 0409 09/21/17 0630  WBC 3.2* 6.3 14.7* 22.5* 24.2* 20.4* 17.0*  NEUTROABS 0.6* 3.5 9.4*  --   --   --   --   HGB 9.6* 9.3* 9.7* 8.6* 8.1* 8.3* 8.0*  HCT 27.8* 27.5* 28.5* 25.6* 23.9* 24.6* 24.2*  MCV 90.6 91.1 90.2 91.1 93.0 92.5 95.3  PLT 52* 57* 63* 63* 65* 64* 65*   Basic Metabolic Panel:  Recent Labs Lab 09/16/17 0431 09/17/17 0030 09/18/17 0351 09/19/17 0352 09/20/17 0409 09/21/17 0630  NA 138 136 137 138 139 138  K 3.2* 3.1* 2.7* 3.0* 3.6 4.0  CL 111 108 107 107 107 107  CO2 21* 21* 24 25 27 28   GLUCOSE 118* 148* 138* 129* 113* 97  BUN 36* 39* 35* 34* 32* 33*  CREATININE 0.90 0.86 0.78 0.65 0.57 0.48  CALCIUM 8.0* 7.7* 7.5* 7.3* 7.3* 7.2*  MG 1.6* 1.8 1.6* 1.8 2.0  --    Liver Function Tests:  Recent Labs Lab 09/15/17 0500 09/16/17 0431  AST 20 22  ALT 30 32  ALKPHOS 53 59  BILITOT 1.7* 1.7*  PROT 3.0* 3.2*  ALBUMIN 1.6* 1.7*   Coagulation Profile:  Recent Labs Lab 09/15/17 0535  INR 1.23   CBG:  Recent Labs Lab 09/19/17 1132  GLUCAP 145*   Urine analysis:    Component Value Date/Time   COLORURINE YELLOW 09/11/2017 1650   APPEARANCEUR TURBID (A) 09/11/2017 1650   LABSPEC 1.018 09/11/2017 1650   PHURINE 5.0 09/11/2017 1650   GLUCOSEU NEGATIVE 09/11/2017 1650   HGBUR LARGE (A) 09/11/2017 1650   BILIRUBINUR NEGATIVE 09/11/2017 1650   KETONESUR NEGATIVE 09/11/2017 1650   PROTEINUR NEGATIVE 09/11/2017 1650   NITRITE NEGATIVE 09/11/2017 1650   LEUKOCYTESUR TRACE (A) 09/11/2017 1650   Recent Results (from the past 240 hour(s))  Culture, blood (routine  x 2)     Status: None   Collection Time: 09/11/17 11:35 PM  Result Value Ref Range Status   Specimen Description BLOOD LEFT HAND  Final   Special Requests IN PEDIATRIC BOTTLE Blood Culture adequate volume  Final   Culture   Final    NO GROWTH 5 DAYS Performed at [facility], [place] [address], [place], [place] [address]    Report Status 09/17/2017 FINAL  Final  Culture, blood (routine x 2)     Status: None   Collection Time: 09/11/17 11:35 PM  Result Value Ref Range Status   Specimen Description BLOOD RIGHT HAND  Final   Special Requests IN PEDIATRIC BOTTLE Blood Culture adequate volume  Final   Culture   Final  NO GROWTH 5 DAYS Performed at [facility], [place] [address], [place], [place] [address]    Report Status 09/17/2017 FINAL  Final  Respiratory Panel by PCR     Status: Abnormal   Collection Time: 09/18/17  9:54 AM  Result Value Ref Range Status   Adenovirus NOT DETECTED NOT DETECTED Final   Coronavirus 229E NOT DETECTED NOT DETECTED Final   Coronavirus HKU1 NOT DETECTED NOT DETECTED Final   Coronavirus NL63 NOT DETECTED NOT DETECTED Final   Coronavirus OC43 NOT DETECTED NOT DETECTED Final   Metapneumovirus NOT DETECTED NOT DETECTED Final   Rhinovirus / Enterovirus NOT DETECTED NOT DETECTED Final   Influenza A NOT DETECTED NOT DETECTED Final   Influenza B NOT DETECTED NOT DETECTED Final   Parainfluenza Virus 1 NOT DETECTED NOT DETECTED Final   Parainfluenza Virus 2 DETECTED (A) NOT DETECTED Final   Parainfluenza Virus 3 NOT DETECTED NOT DETECTED Final   Parainfluenza Virus 4 NOT DETECTED NOT DETECTED Final   Respiratory Syncytial Virus NOT DETECTED NOT DETECTED Final   Bordetella pertussis NOT DETECTED NOT DETECTED Final   Chlamydophila pneumoniae NOT DETECTED NOT DETECTED Final   Mycoplasma pneumoniae NOT DETECTED NOT DETECTED Final    Comment: Performed at [facility], [place] [address] [place], [place] [address]  Culture, body fluid-bottle     Status:  None (Preliminary result)   Collection Time: 09/20/17  3:54 PM  Result Value Ref Range Status   Specimen Description FLUID LEFT PLEURAL  Final   Special Requests NONE  Final   Culture   Final    NO GROWTH < 24 HOURS Performed at [facility], [place] [address] [place], [place] [address]    Report Status PENDING  Incomplete  Gram stain     Status: None   Collection Time: 09/20/17  3:54 PM  Result Value Ref Range Status   Specimen Description FLUID LEFT PLEURAL  Final   Special Requests NONE  Final   Gram Stain   Final    FEW WBC PRESENT,BOTH PMN AND MONONUCLEAR NO ORGANISMS SEEN Performed at [facility], [address], [place], [place] [address]    Report Status 09/20/2017 FINAL  Final    Radiology Studies: Ct Chest Wo Contrast Result Date: 09/19/2017 1. Regression of previously noted lymphadenopathy indicative of a positive response to therapy.  2. New patchy areas of mid to upper lung predominant ground-glass attenuation and interlobular septal thickening. This is nonspecific, but given the normal cardiac size, this does not appear to reflect cardiogenic pulmonary edema. Noncardiogenic edema, alveolar hemorrhage and/or acute atypical infection are the primary differential considerations.  3. Increasing moderate bilateral pleural effusions, large volume of ascites, and diffuse body wall edema, suggestive of a state of underlying anasarca.  4. Aortic atherosclerosis, in addition to left anterior descending coronary artery disease. Assessment for potential risk factor modification, dietary therapy or pharmacologic therapy may be warranted, if clinically indicated.  5. Mild diffuse bronchial wall thickening with mild centrilobular and paraseptal emphysema; imaging findings suggestive of underlying COPD.    Dg Chest Port 1 View Result Date: 09/20/2017 Result Date: 09/20/2017 Successful ultrasound guided diagnostic and therapeutic left thoracentesis yielding 580 cc of pleural  fluid.  Scheduled Meds: . albuterol  2.5 mg Nebulization TID  . benzonatate  100 mg Oral TID  . Chlorhexidine Gluconate Cloth  6 each Topical q morning - 10a  . furosemide  40 mg Intravenous Daily  . hydrocortisone sod  succinate (SOLU-CORTEF) inj  50 mg Intravenous Daily  . pantoprazole  40 mg Oral Daily  . potassium chloride  40 mEq Oral BID  . protein supplement shake  2 oz Oral QID   Continuous Infusions:   LOS: 10 days   Time spent: 25 minutes   Vance Gather, MD [facility] Pager [phone]  If 7PM-7AM, please contact night-coverage [email or web address] Password [facility] 09/21/2017, 8:39 PM

## 2017-09-21 NOTE — Plan of Care (Signed)
Problem: Skin Integrity: Goal: Risk for impaired skin integrity will decrease Outcome: Completed/Met Date Met: 09/21/17 Pt is agreeable to turning every 2 hours. Barrier creams and Intra dry used in abd folds   

## 2017-09-22 ENCOUNTER — Inpatient Hospital Stay (HOSPITAL_COMMUNITY): Payer: Medicare Other

## 2017-09-22 LAB — CBC
HCT: 22.4 % — ABNORMAL LOW (ref 36.0–46.0)
Hemoglobin: 7.4 g/dL — ABNORMAL LOW (ref 12.0–15.0)
MCH: 31.5 pg (ref 26.0–34.0)
MCHC: 33 g/dL (ref 30.0–36.0)
MCV: 95.3 fL (ref 78.0–100.0)
PLATELETS: 68 10*3/uL — AB (ref 150–400)
RBC: 2.35 MIL/uL — AB (ref 3.87–5.11)
RDW: 23 % — AB (ref 11.5–15.5)
WBC: 14.9 10*3/uL — AB (ref 4.0–10.5)

## 2017-09-22 NOTE — Progress Notes (Signed)
Report received from [place], [place]. Agree with previous RN's assessment. Will continue to monitor pt closely. Carnella [place] I

## 2017-09-22 NOTE — Plan of Care (Signed)
Problem: Nutrition: Goal: Adequate nutrition will be maintained Outcome: Completed/Met Date Met: 09/22/17 Patient has tolerated Po's thus far. Pt has been eating smaller meals and drinking premier protein to assist with nutrition

## 2017-09-22 NOTE — Progress Notes (Signed)
Patient ID: Kristina Torres, female   DOB: [birth year], 71 y.o.   MRN: [id number]    PROGRESS NOTE  Kristina Torres  SJG:[id number] DOB: [birth year] DOA: 09/10/2017  PCP: [name], MD   Brief Narrative:  Pt is 71 yo female with known DLBCL (started on chemo Cytoxan and Vincristine on 9/20), RA, HLD, HTN, recent hospitalization for right sided hydronephrosis and is now s/p perc nephrostomy tube placement and exchange on 08/31/2017 after tube malfunction, presented with weakness, found to be hypotensive with presumptive sepsis. In ED, pt has received boluses of IV NS and was admitted for further evaluation. She has initially required vasopressor support and received significant IVF resuscitation. Pressors were weaned and diuresis started, slowly improving.    Assessment & Plan: Hypotension: suspected distributive shock, third-spacing IVF's administered earlier this hospitalization. Has been off pressors since 09/14/2017 - SBP remains low, limiting diuresis. Considering lasix + albumin.  - No evidence of sepsis at this time. Blood cultures NGTD s/p 5 days abx.   Acute respiratory distress: Suspect acute diastolic CHF, bilateral pleural effusions, parainfluenza virus.  - Improved with thoracentesis 10/4, 580cc transudate. If worsens, will consider repeat on left side.  - CT chest also showed new patchy areas of mid to upper lung predominant ground-glass attenuation and interlobular septal thickening. This is nonspecific, but given the normal cardiac size, this does not appear to reflect cardiogenic pulmonary edema. Noncardiogenic edema, alveolar hemorrhage and/or acute atypical infection are the primary differential considerations - Consider PCCM input if not improving, though currently improving with diuresis and supportive care.  - Continue lasix 40 mg IV QD, weight trend steadily improving. Diuresis limited by hypotension. Case discussed with Dr. Jonnie Finner who does not believe albumin would offer  benefit at this time given that she's had effective diuresis.   Right-sided hydronephrosis: Thought to be due to extrinsic compressiondue to malignancy, now s/p replacement of perc nephrostomy tube. Output is good. - Sent EPIC message with Dr. Karsten Ro 10/4 per family's request, though no urgent need for consultation.  Acute diastolic CHF - Echocardiogram on 08/30/2017 shows grade 1 diastolic dysfunction. EF 60-65%. - keep on lasix IV 40 mg QD  Hypokalemia and hypomagnesemia: - Supplemented, improved.   DLBCL: Incidentally noted regression of lymphadenopathy on CT chest after 1st cycle of chemotherapy.  - Appreciate heme/onc assistance. Chemo on hold for now.  Severe protein calorie malnutrition - Continue po supplementation. Discussed that this is a significant predictor of her clinical course.  Pancytopenia: Suspect chemotx induced, improving s/p G-CSF - Appreciate oncology recommendations.   Constipation: - Cont bowel regimen   Rheumatoid arthritis. - On methotrexate in the past currently not on any medication.  Morbid obesity  - Body mass index is 47 kg/m.  DVT prophylaxis: SCD's Code Status: Full Family Communication: None at bedside Disposition Plan: to be determined but when medically stable, will go to SNF. Requires continued diuresis.   Consultants:   [place]   Nephrology by phone 10/6.  Procedures:   ECHO   Antimicrobials:   Vanc and Zosyn x5 days   Subjective: Reports continued incremental improvement in dyspnea. Wants to get out of bed. Swelling is stable over past few days, but significantly improved overall.   Objective: Vitals:   09/22/17 0454 09/22/17 0833 09/22/17 1100 09/22/17 1337  BP: (!) 89/43  (!) 110/54 (!) 98/53  Pulse: 89  93 94  Resp: 18   16  Temp: 98.2 F (36.8 C)   98.1 F (36.7 C)  TempSrc: Oral   Oral  SpO2: 97% 92%  92%  Weight: 100.4 kg (221 lb 5.5 oz)     Height:        Intake/Output Summary (Last 24 hours)  at 09/22/17 1339 Last data filed at 09/22/17 1338  Gross per 24 hour  Intake              485 ml  Output             4175 ml  Net            -3690 ml   Filed Weights   09/20/17 0630 09/21/17 0504 09/22/17 0454  Weight: 102 kg (224 lb 13.9 oz) 101.2 kg (223 lb 1.7 oz) 100.4 kg (221 lb 5.5 oz)   Physical Exam  Constitutional: Appears calm ,NAD CVS: RRR no murmur or JVD. Anasarca.  Pulmonary: No crackles, but does have expiratory rhonchi diffusely with mild decrease at bases. Nonlabored.  Abdominal: Soft. BS +,  no distension, tenderness, rebound or guarding.  Neuro: Alert. Normal reflexes, muscle tone coordination. No cranial nerve deficit. Skin: Skin is warm and dry. No rash noted. Not diaphoretic. No erythema. No pallor. Posterior thoracentesis punctum c/d/i.  Data Reviewed: I have personally reviewed following labs and imaging studies  CBC:  Recent Labs Lab 09/16/17 0431 09/17/17 0030 09/18/17 0351 09/19/17 0352 09/20/17 0409 09/21/17 0630 09/22/17 0334  WBC 6.3 14.7* 22.5* 24.2* 20.4* 17.0* 14.9*  NEUTROABS 3.5 9.4*  --   --   --   --   --   HGB 9.3* 9.7* 8.6* 8.1* 8.3* 8.0* 7.4*  HCT 27.5* 28.5* 25.6* 23.9* 24.6* 24.2* 22.4*  MCV 91.1 90.2 91.1 93.0 92.5 95.3 95.3  PLT 57* 63* 63* 65* 64* 65* 68*   Basic Metabolic Panel:  Recent Labs Lab 09/16/17 0431 09/17/17 0030 09/18/17 0351 09/19/17 0352 09/20/17 0409 09/21/17 0630  NA 138 136 137 138 139 138  K 3.2* 3.1* 2.7* 3.0* 3.6 4.0  CL 111 108 107 107 107 107  CO2 21* 21* 24 25 27 28   GLUCOSE 118* 148* 138* 129* 113* 97  BUN 36* 39* 35* 34* 32* 33*  CREATININE 0.90 0.86 0.78 0.65 0.57 0.48  CALCIUM 8.0* 7.7* 7.5* 7.3* 7.3* 7.2*  MG 1.6* 1.8 1.6* 1.8 2.0  --    Liver Function Tests:  Recent Labs Lab 09/16/17 0431  AST 22  ALT 32  ALKPHOS 59  BILITOT 1.7*  PROT 3.2*  ALBUMIN 1.7*   Coagulation Profile: No results for input(s): INR, PROTIME in the last 168 hours. CBG:  Recent Labs Lab  09/19/17 1132  GLUCAP 145*   Urine analysis:    Component Value Date/Time   COLORURINE YELLOW 09/11/2017 1650   APPEARANCEUR TURBID (A) 09/11/2017 1650   LABSPEC 1.018 09/11/2017 1650   PHURINE 5.0 09/11/2017 1650   GLUCOSEU NEGATIVE 09/11/2017 1650   HGBUR LARGE (A) 09/11/2017 1650   BILIRUBINUR NEGATIVE 09/11/2017 1650   KETONESUR NEGATIVE 09/11/2017 1650   PROTEINUR NEGATIVE 09/11/2017 1650   NITRITE NEGATIVE 09/11/2017 1650   LEUKOCYTESUR TRACE (A) 09/11/2017 1650   Recent Results (from the past 240 hour(s))  Respiratory Panel by PCR     Status: Abnormal   Collection Time: 09/18/17  9:54 AM  Result Value Ref Range Status   Adenovirus NOT DETECTED NOT DETECTED Final   Coronavirus 229E NOT DETECTED NOT DETECTED Final   Coronavirus HKU1 NOT DETECTED NOT DETECTED Final   Coronavirus NL63 NOT DETECTED NOT DETECTED Final  Coronavirus OC43 NOT DETECTED NOT DETECTED Final   Metapneumovirus NOT DETECTED NOT DETECTED Final   Rhinovirus / Enterovirus NOT DETECTED NOT DETECTED Final   Influenza A NOT DETECTED NOT DETECTED Final   Influenza B NOT DETECTED NOT DETECTED Final   Parainfluenza Virus 1 NOT DETECTED NOT DETECTED Final   Parainfluenza Virus 2 DETECTED (A) NOT DETECTED Final   Parainfluenza Virus 3 NOT DETECTED NOT DETECTED Final   Parainfluenza Virus 4 NOT DETECTED NOT DETECTED Final   Respiratory Syncytial Virus NOT DETECTED NOT DETECTED Final   Bordetella pertussis NOT DETECTED NOT DETECTED Final   Chlamydophila pneumoniae NOT DETECTED NOT DETECTED Final   Mycoplasma pneumoniae NOT DETECTED NOT DETECTED Final    Comment: Performed at [facility], [place] [address], [place], [place] [address]  Culture, body fluid-bottle     Status: None (Preliminary result)   Collection Time: 09/20/17  3:54 PM  Result Value Ref Range Status   Specimen Description FLUID LEFT PLEURAL  Final   Special Requests NONE  Final   Culture   Final    NO GROWTH 2 DAYS Performed at [facility], [place] [address] [place], [place] [address]    Report Status PENDING  Incomplete  Gram stain     Status: None   Collection Time: 09/20/17  3:54 PM  Result Value Ref Range Status   Specimen Description FLUID LEFT PLEURAL  Final   Special Requests NONE  Final   Gram Stain   Final    FEW WBC PRESENT,BOTH PMN AND MONONUCLEAR NO ORGANISMS SEEN Performed at [facility], [address] [place], [place] [address]    Report Status 09/20/2017 FINAL  Final    Radiology Studies: Ct Chest Wo Contrast Result Date: 09/19/2017 1. Regression of previously noted lymphadenopathy indicative of a positive response to therapy.  2. New patchy areas of mid to upper lung predominant ground-glass attenuation and interlobular septal thickening. This is nonspecific, but given the normal cardiac size, this does not appear to reflect cardiogenic pulmonary edema. Noncardiogenic edema, alveolar hemorrhage and/or acute atypical infection are the primary differential considerations.  3. Increasing moderate bilateral pleural effusions, large volume of ascites, and diffuse body wall edema, suggestive of a state of underlying anasarca.  4. Aortic atherosclerosis, in addition to left anterior descending coronary artery disease. Assessment for potential risk factor modification, dietary therapy or pharmacologic therapy may be warranted, if clinically indicated.  5. Mild diffuse bronchial wall thickening with mild centrilobular and paraseptal emphysema; imaging findings suggestive of underlying COPD.    Dg Chest Port 1 View Result Date: 09/20/2017 Result Date: 09/20/2017 Successful ultrasound guided diagnostic and therapeutic left thoracentesis yielding 580 cc of pleural fluid.  Scheduled Meds: . albuterol  2.5 mg Nebulization TID  . benzonatate  100 mg Oral TID  . Chlorhexidine Gluconate Cloth  6 each Topical q morning - 10a  . furosemide  40 mg Intravenous Daily  . hydrocortisone sod succinate  (SOLU-CORTEF) inj  50 mg Intravenous Daily  . pantoprazole  40 mg Oral Daily  . potassium chloride  40 mEq Oral BID  . protein supplement shake  2 oz Oral QID   Continuous Infusions:   LOS: 11 days   Time spent: 25 minutes   Vance Gather, MD [facility] Pager [phone]  If 7PM-7AM, please contact night-coverage [email or web address] Password [email or web address] 09/22/2017, 1:39 PM

## 2017-09-23 DIAGNOSIS — N139 Obstructive and reflux uropathy, unspecified: Secondary | ICD-10-CM

## 2017-09-23 DIAGNOSIS — J90 Pleural effusion, not elsewhere classified: Secondary | ICD-10-CM

## 2017-09-23 NOTE — Progress Notes (Signed)
Patient ID: Kristina Torres, female   DOB: September 20, 1946, 71 y.o.   MRN: [id number]    PROGRESS NOTE  Marasia Newhall  SWN:[id number] DOB: Oct 03, 1946 DOA: 09/10/2017  PCP: [name], MD   Brief Narrative:  Pt is 71 yo female with known DLBCL (started on chemo Cytoxan and Vincristine on 9/20), RA, HLD, HTN, recent hospitalization for right sided hydronephrosis and is now s/p perc nephrostomy tube placement and exchange on 08/31/2017 after tube malfunction, presented with weakness, found to be hypotensive with presumptive sepsis. In ED, pt has received boluses of IV NS and was admitted for further evaluation. She has initially required vasopressor support and received significant IVF resuscitation. Pressors were weaned and diuresis started, slowly improving.    Assessment & Plan: Hypotension: suspected distributive shock, third-spacing IVF's administered earlier this hospitalization. Has been off pressors since 09/14/2017 - SBP remains low, limiting diuresis, though continues to have good UOP. - No evidence of sepsis at this time. Blood cultures NGTD s/p 5 days abx.   Acute respiratory distress: Suspect acute diastolic CHF, bilateral pleural effusions, parainfluenza virus.  - Improved with thoracentesis 10/4, 580cc transudate. Repeat imaging showing very small effusions, no indication for repeat.  - Continue lasix 40 mg IV QD, weight trend steadily improving. Was up >13L on 9/28 and has since continued negative balance. Case discussed with Dr. Jonnie Finner who does not believe albumin would offer benefit at this time given that she's had effective, albeit slow, diuresis.  - Continue breathing treatments as needed  Right-sided hydronephrosis: Thought to be due to extrinsic compression due to malignancy, now s/p replacement of perc nephrostomy tube 9/14. Output is good. - Discussed with urology, Dr. Gloriann Loan. Nephrostomy tube without clinical evidence of malfunction, so no indication to exchange at this  time. Can remain in up to 3 months, so will have follow up with urology, Dr. Karsten Ro, as outpatient.  - Cause of hydronephrosis is being addressed with chemotherapy as below.  Acute on chronic diastolic CHF - Echocardiogram on 08/30/2017 shows grade 1 diastolic dysfunction. EF 60-65%. - Continue lasix 40mg  IV daily. Would not switch to po until anasarca improves dramatically as absorption would likely be impaired.  Hypokalemia and hypomagnesemia: - Supplemented, improved.   DLBCL: Incidentally noted regression of lymphadenopathy on CT chest after 1st cycle of chemotherapy.  - Appreciate heme/onc assistance. Chemo on hold for now.  Severe protein calorie malnutrition - Continue po supplementation. Discussed that this is a significant predictor of her clinical course.  Pancytopenia: Suspect chemotx induced, improving s/p G-CSF. Hgb trending downward without active bleeding. Currently without symptoms. - Monitor CBC, transfuse for hgb <7.   Constipation: - Cont bowel regimen   Rheumatoid arthritis. - On methotrexate in the past currently not on any medication.  Morbid obesity  - Body mass index is 47 kg/m.  DVT prophylaxis: SCD's Code Status: Full Family Communication: Husband at bedside Disposition Plan: to be determined but when medically stable, will go to SNF. Requires continued IV diuresis.   Consultants:   [place]   Nephrology by phone 10/6.  Urology by phone 10/7  Procedures:   ECHO   Antimicrobials:   Vanc and Zosyn x5 days   Subjective: Wants a hot dog. Wants to get out of bed. PT couldn't help with her Friday or Saturday. Swelling is slightly improved, still significant. No chest pain.   Objective: Vitals:   09/22/17 2023 09/22/17 2024 09/23/17 0625 09/23/17 0700  BP:  (!) 114/50 (!) 86/61   Pulse:  89 88   Resp:  18 18   Temp:  97.8 F (36.6 C) (!) 97.5 F (36.4 C)   TempSrc:  Oral Oral   SpO2: 96% 96% 100%   Weight:    96.5 kg (212 lb  11.9 oz)  Height:        Intake/Output Summary (Last 24 hours) at 09/23/17 1357 Last data filed at 09/23/17 0845  Gross per 24 hour  Intake              445 ml  Output             1925 ml  Net            -1480 ml   Filed Weights   09/21/17 0504 09/22/17 0454 09/23/17 0700  Weight: 101.2 kg (223 lb 1.7 oz) 100.4 kg (221 lb 5.5 oz) 96.5 kg (212 lb 11.9 oz)   Physical Exam  Constitutional: Appears calm ,NAD CVS: RRR no murmur or JVD. Significant edema in legs and abdominal wall.  Pulmonary: No crackles, but does have expiratory rhonchi diffusely with mild decrease at bases. Nonlabored.  Abdominal: Soft. BS +,  no distension, tenderness, rebound or guarding.  Neuro: Alert. Normal reflexes, muscle tone coordination. No cranial nerve deficit. Skin: Skin is warm and dry. No rash noted. Not diaphoretic. No erythema. No pallor. Posterior thoracentesis punctum c/d/i.  Data Reviewed: I have personally reviewed following labs and imaging studies  CBC:  Recent Labs Lab 09/17/17 0030 09/18/17 0351 09/19/17 0352 09/20/17 0409 09/21/17 0630 09/22/17 0334  WBC 14.7* 22.5* 24.2* 20.4* 17.0* 14.9*  NEUTROABS 9.4*  --   --   --   --   --   HGB 9.7* 8.6* 8.1* 8.3* 8.0* 7.4*  HCT 28.5* 25.6* 23.9* 24.6* 24.2* 22.4*  MCV 90.2 91.1 93.0 92.5 95.3 95.3  PLT 63* 63* 65* 64* 65* 68*   Basic Metabolic Panel:  Recent Labs Lab 09/17/17 0030 09/18/17 0351 09/19/17 0352 09/20/17 0409 09/21/17 0630  NA 136 137 138 139 138  K 3.1* 2.7* 3.0* 3.6 4.0  CL 108 107 107 107 107  CO2 21* 24 25 27 28   GLUCOSE 148* 138* 129* 113* 97  BUN 39* 35* 34* 32* 33*  CREATININE 0.86 0.78 0.65 0.57 0.48  CALCIUM 7.7* 7.5* 7.3* 7.3* 7.2*  MG 1.8 1.6* 1.8 2.0  --    Liver Function Tests: No results for input(s): AST, ALT, ALKPHOS, BILITOT, PROT, ALBUMIN in the last 168 hours. Coagulation Profile: No results for input(s): INR, PROTIME in the last 168 hours. CBG:  Recent Labs Lab 09/19/17 1132  GLUCAP  145*   Urine analysis:    Component Value Date/Time   COLORURINE YELLOW 09/11/2017 1650   APPEARANCEUR TURBID (A) 09/11/2017 1650   LABSPEC 1.018 09/11/2017 1650   PHURINE 5.0 09/11/2017 1650   GLUCOSEU NEGATIVE 09/11/2017 1650   HGBUR LARGE (A) 09/11/2017 1650   BILIRUBINUR NEGATIVE 09/11/2017 1650   KETONESUR NEGATIVE 09/11/2017 1650   PROTEINUR NEGATIVE 09/11/2017 1650   NITRITE NEGATIVE 09/11/2017 1650   LEUKOCYTESUR TRACE (A) 09/11/2017 1650   Recent Results (from the past 240 hour(s))  Respiratory Panel by PCR     Status: Abnormal   Collection Time: 09/18/17  9:54 AM  Result Value Ref Range Status   Adenovirus NOT DETECTED NOT DETECTED Final   Coronavirus 229E NOT DETECTED NOT DETECTED Final   Coronavirus HKU1 NOT DETECTED NOT DETECTED Final   Coronavirus NL63 NOT DETECTED NOT DETECTED Final  Coronavirus OC43 NOT DETECTED NOT DETECTED Final   Metapneumovirus NOT DETECTED NOT DETECTED Final   Rhinovirus / Enterovirus NOT DETECTED NOT DETECTED Final   Influenza A NOT DETECTED NOT DETECTED Final   Influenza B NOT DETECTED NOT DETECTED Final   Parainfluenza Virus 1 NOT DETECTED NOT DETECTED Final   Parainfluenza Virus 2 DETECTED (A) NOT DETECTED Final   Parainfluenza Virus 3 NOT DETECTED NOT DETECTED Final   Parainfluenza Virus 4 NOT DETECTED NOT DETECTED Final   Respiratory Syncytial Virus NOT DETECTED NOT DETECTED Final   Bordetella pertussis NOT DETECTED NOT DETECTED Final   Chlamydophila pneumoniae NOT DETECTED NOT DETECTED Final   Mycoplasma pneumoniae NOT DETECTED NOT DETECTED Final    Comment: Performed at [facility], [place] [address] [place], [place] [address]  Culture, body fluid-bottle     Status: None (Preliminary result)   Collection Time: 09/20/17  3:54 PM  Result Value Ref Range Status   Specimen Description FLUID LEFT PLEURAL  Final   Special Requests NONE  Final   Culture   Final    NO GROWTH 2 DAYS Performed at [facility], [place]  [address], [place], [place] [address]    Report Status PENDING  Incomplete  Gram stain     Status: None   Collection Time: 09/20/17  3:54 PM  Result Value Ref Range Status   Specimen Description FLUID LEFT PLEURAL  Final   Special Requests NONE  Final   Gram Stain   Final    FEW WBC PRESENT,BOTH PMN AND MONONUCLEAR NO ORGANISMS SEEN Performed at [facility], [address] [place], [place] [address]    Report Status 09/20/2017 FINAL  Final    Radiology Studies: Ct Chest Wo Contrast Result Date: 09/19/2017 1. Regression of previously noted lymphadenopathy indicative of a positive response to therapy.  2. New patchy areas of mid to upper lung predominant ground-glass attenuation and interlobular septal thickening. This is nonspecific, but given the normal cardiac size, this does not appear to reflect cardiogenic pulmonary edema. Noncardiogenic edema, alveolar hemorrhage and/or acute atypical infection are the primary differential considerations.  3. Increasing moderate bilateral pleural effusions, large volume of ascites, and diffuse body wall edema, suggestive of a state of underlying anasarca.  4. Aortic atherosclerosis, in addition to left anterior descending coronary artery disease. Assessment for potential risk factor modification, dietary therapy or pharmacologic therapy may be warranted, if clinically indicated.  5. Mild diffuse bronchial wall thickening with mild centrilobular and paraseptal emphysema; imaging findings suggestive of underlying COPD.    Dg Chest Port 1 View Result Date: 09/20/2017 Result Date: 09/20/2017 Successful ultrasound guided diagnostic and therapeutic left thoracentesis yielding 580 cc of pleural fluid.  Scheduled Meds: . albuterol  2.5 mg Nebulization TID  . benzonatate  100 mg Oral TID  . Chlorhexidine Gluconate Cloth  6 each Topical q morning - 10a  . furosemide  40 mg Intravenous Daily  . hydrocortisone sod succinate (SOLU-CORTEF) inj  50 mg  Intravenous Daily  . pantoprazole  40 mg Oral Daily  . potassium chloride  40 mEq Oral BID  . protein supplement shake  2 oz Oral QID   Continuous Infusions:   LOS: 12 days   Time spent: 25 minutes   Vance Gather, MD [facility] Pager [phone]  If 7PM-7AM, please contact night-coverage [email or web address] Password [email or web address] 09/23/2017, 1:57 PM

## 2017-09-23 NOTE — Plan of Care (Signed)
Problem: Respiratory: Goal: Ability to maintain adequate ventilation will improve Outcome: Completed/Met Date Met: 09/23/17 Pt o2 sat maintained. Pt denies sob. Pt has remained in bed, tolerated turning well

## 2017-09-24 DIAGNOSIS — N139 Obstructive and reflux uropathy, unspecified: Secondary | ICD-10-CM

## 2017-09-24 DIAGNOSIS — M069 Rheumatoid arthritis, unspecified: Secondary | ICD-10-CM

## 2017-09-24 DIAGNOSIS — J9601 Acute respiratory failure with hypoxia: Secondary | ICD-10-CM

## 2017-09-24 LAB — CBC
HCT: 22.5 % — ABNORMAL LOW (ref 36.0–46.0)
HEMOGLOBIN: 7.3 g/dL — AB (ref 12.0–15.0)
MCH: 31.6 pg (ref 26.0–34.0)
MCHC: 32.4 g/dL (ref 30.0–36.0)
MCV: 97.4 fL (ref 78.0–100.0)
PLATELETS: 106 10*3/uL — AB (ref 150–400)
RBC: 2.31 MIL/uL — AB (ref 3.87–5.11)
RDW: 23.1 % — ABNORMAL HIGH (ref 11.5–15.5)
WBC: 9.4 10*3/uL (ref 4.0–10.5)

## 2017-09-24 LAB — BASIC METABOLIC PANEL
Anion gap: 6 (ref 5–15)
BUN: 36 mg/dL — ABNORMAL HIGH (ref 6–20)
CALCIUM: 7.2 mg/dL — AB (ref 8.9–10.3)
CHLORIDE: 103 mmol/L (ref 101–111)
CO2: 29 mmol/L (ref 22–32)
CREATININE: 0.54 mg/dL (ref 0.44–1.00)
GFR calc non Af Amer: >60 mL/min (ref >60)
GLUCOSE: 82 mg/dL (ref 65–99)
Potassium: 4.1 mmol/L (ref 3.5–5.1)
Sodium: 138 mmol/L (ref 135–145)

## 2017-09-24 LAB — PREPARE RBC (CROSSMATCH)

## 2017-09-24 LAB — TISSUE HYBRIDIZATION (BONE MARROW)-NCBH

## 2017-09-24 MED ORDER — HYDROCORTISONE 20 MG PO TABS
20.0000 mg | ORAL_TABLET | Freq: Two times a day (BID) | ORAL | Status: DC
  Administered 2017-09-25: 20 mg via ORAL
  Filled 2017-09-24 (×3): qty 1

## 2017-09-24 MED ORDER — ALBUTEROL SULFATE (2.5 MG/3ML) 0.083% IN NEBU
INHALATION_SOLUTION | RESPIRATORY_TRACT | Status: AC
  Administered 2017-09-24: 2.5 mg via RESPIRATORY_TRACT
  Filled 2017-09-24: qty 3

## 2017-09-24 MED ORDER — ALBUTEROL SULFATE (2.5 MG/3ML) 0.083% IN NEBU
2.5000 mg | INHALATION_SOLUTION | RESPIRATORY_TRACT | Status: DC | PRN
  Administered 2017-09-24: 2.5 mg via RESPIRATORY_TRACT
  Filled 2017-09-24: qty 3

## 2017-09-24 MED ORDER — SODIUM CHLORIDE 0.9 % IV SOLN
Freq: Once | INTRAVENOUS | Status: AC
  Administered 2017-09-24: 18:00:00 via INTRAVENOUS

## 2017-09-24 NOTE — Care Management Important Message (Signed)
Important Message  Patient Details  Name: Kristina Torres MRN: [id number] Date of Birth: [birth year]   Medicare Important Message Given:  Yes    Kerin Salen 09/24/2017, 11:43 AMImportant Message  Patient Details  Name: Kristina Torres MRN: [id number] Date of Birth: [birth year]   Medicare Important Message Given:  Yes    Kerin Salen 09/24/2017, 11:42 AM

## 2017-09-24 NOTE — Progress Notes (Signed)
Physical Therapy Treatment Patient Details Name: Kristina Torres MRN: [id number] DOB: 07-25-1946 Today's Date: 09/24/2017    History of Present Illness 71 y.o. Female with history of multiple medical problems including rheumatoid arthritis, hyperlipidemia, history of hypertension, and diffuse large B-cell lymphoma. Patient recently hospitalized with right-sided hydronephrosis status post percutaneous nephrostomy tube and exchange on 9/14 after tube malfunction. Patient started on chemotherapy with Cytoxan and vincristine on 9/20 and subsequently discharged to rehabilitation on 9/22. Patient's tube was fractured at her nursing facility and returned to hospital and was found to be hypotensive with borderline blood pressure for at least 2 weeks. PMH: HTN, HLD, gout, RA    PT Comments    Pt very pleasant and willing but limited due to B LE edema and generalized weakness.  See details below.   Follow Up Recommendations  SNF     Equipment Recommendations  None recommended by PT    Recommendations for Other Services       Precautions / Restrictions Precautions Precautions: Fall Precaution Comments: right side nephrostomy tube, Port, LEs with pitting edema Restrictions Weight Bearing Restrictions: No    Mobility  Bed Mobility Overal bed mobility: Needs Assistance Bed Mobility: Supine to Sit;Sit to Supine     Supine to sit: Max assist;Total assist;+2 for physical assistance;+2 for safety/equipment (pt 20%) Sit to supine: Max assist;+2 for physical assistance;Total assist (pt 5%)   General bed mobility comments: supine to sit + 2 assist pt 20%       sit to supine +2 assist pt 5% able  Transfers Overall transfer level: Needs assistance Equipment used: None Transfers: Sit to/from Stand Sit to Stand: Max assist         General transfer comment: attempted sit to stand several attempts.  B LE VERY edema "I can't feel my feet" when placed on floor.  Attempted sit to stand using  walker, pt was unable to clear hips off bed.  Attempted sit to stand using "bear hug" front assist pt was able to stand upright but then unable to weight shift/off load eith LE to attempt 1/4 pivot turn to recliner.  Pt demonstarted increased anxiety and B knees buckling so assisted back to EOB.    Ambulation/Gait             General Gait Details: unable to attempt due to limited standing tolerance and inablility to advance either LE.     Stairs            Wheelchair Mobility    Modified Rankin (Stroke Patients Only)       Balance                                            Cognition Arousal/Alertness: Awake/alert Behavior During Therapy: WFL for tasks assessed/performed Overall Cognitive Status: Within Functional Limits for tasks assessed                                 General Comments: mild anxiety with attempted standing      Exercises      General Comments        Pertinent Vitals/Pain Pain Assessment: Faces Faces Pain Scale: Hurts little more Pain Location: L flank Pain Descriptors / Indicators: Operative site guarding Pain Intervention(s): Monitored during session;Repositioned    Home Living  Prior Function            PT Goals (current goals can now be found in the care plan section) Progress towards PT goals: Progressing toward goals    Frequency    Min 2X/week      PT Plan Current plan remains appropriate    Co-evaluation              AM-PAC PT "6 Clicks" Daily Activity  Outcome Measure  Difficulty turning over in bed (including adjusting bedclothes, sheets and blankets)?: Unable Difficulty moving from lying on back to sitting on the side of the bed? : Unable Difficulty sitting down on and standing up from a chair with arms (e.g., wheelchair, bedside commode, etc,.)?: Unable Help needed moving to and from a bed to chair (including a wheelchair)?: Total Help needed  walking in hospital room?: Total Help needed climbing 3-5 steps with a railing? : Total 6 Click Score: 6    End of Session Equipment Utilized During Treatment: Gait belt Activity Tolerance: Other (comment);Patient limited by fatigue (HgB 7.3) Patient left: in bed Nurse Communication: Mobility status;Need for lift equipment PT Visit Diagnosis: Muscle weakness (generalized) (M62.81);Other abnormalities of gait and mobility (R26.89)     Time: 1400-1426 PT Time Calculation (min) (ACUTE ONLY): 26 min  Charges:  $Therapeutic Activity: 23-37 mins                    G Codes:       Rica Koyanagi  PTA [facility]  Acute  Rehab Pager      [phone]

## 2017-09-24 NOTE — Progress Notes (Signed)
Kristina Torres   DOB:07/14/1946   HW#:[id number]   VEL#:[id number]  Heme/onc follow-up  Subjective: Kristina Torres feels better overall than last week, much less dyspnea, edema improved overall, responding to iv diuretics. She has no fever, VS stable, no pain. She is still very fatigued and weak, able to sit in chair and stand for a few minutes, but has not been ambulating.   Objective:  Vitals:   09/24/17 0632 09/24/17 1500  BP:  107/62  Pulse:  86  Resp:  18  Temp:  98.2 F (36.8 C)  SpO2: 97% 99%    Body mass index is 43.22 kg/m.  Intake/Output Summary (Last 24 hours) at 09/24/17 1713 Last data filed at 09/24/17 1500  Gross per 24 hour  Intake              610 ml  Output             3175 ml  Net            -2565 ml     Sclerae unicteric  Oropharynx clear  No peripheral adenopathy  Right lung clear, decreased breath sounds in left lung mild wheezing  Heart regular rate and rhythm  Abdomen benign  MSK diffuse anasarca  Neuro nonfocal, awake and alert  CBG (last 3)  No results for input(s): GLUCAP in the last 72 hours.   Labs:  Lab Results  Component Value Date   WBC 9.4 09/24/2017   HGB 7.3 (L) 09/24/2017   HCT 22.5 (L) 09/24/2017   MCV 97.4 09/24/2017   PLT 106 (L) 09/24/2017   NEUTROABS 9.4 (H) 09/17/2017    Urine Studies No results for input(s): UHGB, CRYS in the last 72 hours.  Invalid input(s): UACOL, UAPR, USPG, UPH, UTP, UGL, UKET, UBIL, UNIT, UROB, [place], UEPI, UWBC, Duwayne Heck [place], [place]  Basic Metabolic Panel:  Recent Labs Lab 09/18/17 0351 09/19/17 0352 09/20/17 0409 09/21/17 0630 09/24/17 0359  NA 137 138 139 138 138  K 2.7* 3.0* 3.6 4.0 4.1  CL 107 107 107 107 103  CO2 24 25 27 28 29   GLUCOSE 138* 129* 113* 97 82  BUN 35* 34* 32* 33* 36*  CREATININE 0.78 0.65 0.57 0.48 0.54  CALCIUM 7.5* 7.3* 7.3* 7.2* 7.2*  MG 1.6* 1.8 2.0  --   --    GFR Estimated Creatinine Clearance: 66 mL/min (by C-G formula based on SCr of 0.54 mg/dL). Liver  Function Tests: No results for input(s): AST, ALT, ALKPHOS, BILITOT, PROT, ALBUMIN in the last 168 hours. No results for input(s): LIPASE, AMYLASE in the last 168 hours. No results for input(s): AMMONIA in the last 168 hours. Coagulation profile No results for input(s): INR, PROTIME in the last 168 hours.  CBC:  Recent Labs Lab 09/19/17 0352 09/20/17 0409 09/21/17 0630 09/22/17 0334 09/24/17 0359  WBC 24.2* 20.4* 17.0* 14.9* 9.4  HGB 8.1* 8.3* 8.0* 7.4* 7.3*  HCT 23.9* 24.6* 24.2* 22.4* 22.5*  MCV 93.0 92.5 95.3 95.3 97.4  PLT 65* 64* 65* 68* 106*   Cardiac Enzymes: No results for input(s): CKTOTAL, CKMB, CKMBINDEX, TROPONINI in the last 168 hours. BNP: Invalid input(s): POCBNP CBG:  Recent Labs Lab 09/19/17 1132  GLUCAP 145*   D-Dimer No results for input(s): DDIMER in the last 72 hours. Hgb A1c No results for input(s): HGBA1C in the last 72 hours. Lipid Profile No results for input(s): CHOL, HDL, LDLCALC, TRIG, CHOLHDL, LDLDIRECT in the last 72 hours. Thyroid function studies No results for  input(s): TSH, T4TOTAL, T3FREE, THYROIDAB in the last 72 hours.  Invalid input(s): FREET3 Anemia work up No results for input(s): VITAMINB12, FOLATE, FERRITIN, TIBC, IRON, RETICCTPCT in the last 72 hours. Microbiology Recent Results (from the past 240 hour(s))  Respiratory Panel by PCR     Status: Abnormal   Collection Time: 09/18/17  9:54 AM  Result Value Ref Range Status   Adenovirus NOT DETECTED NOT DETECTED Final   Coronavirus 229E NOT DETECTED NOT DETECTED Final   Coronavirus HKU1 NOT DETECTED NOT DETECTED Final   Coronavirus NL63 NOT DETECTED NOT DETECTED Final   Coronavirus OC43 NOT DETECTED NOT DETECTED Final   Metapneumovirus NOT DETECTED NOT DETECTED Final   Rhinovirus / Enterovirus NOT DETECTED NOT DETECTED Final   Influenza A NOT DETECTED NOT DETECTED Final   Influenza B NOT DETECTED NOT DETECTED Final   Parainfluenza Virus 1 NOT DETECTED NOT DETECTED Final    Parainfluenza Virus 2 DETECTED (A) NOT DETECTED Final   Parainfluenza Virus 3 NOT DETECTED NOT DETECTED Final   Parainfluenza Virus 4 NOT DETECTED NOT DETECTED Final   Respiratory Syncytial Virus NOT DETECTED NOT DETECTED Final   Bordetella pertussis NOT DETECTED NOT DETECTED Final   Chlamydophila pneumoniae NOT DETECTED NOT DETECTED Final   Mycoplasma pneumoniae NOT DETECTED NOT DETECTED Final    Comment: Performed at [facility], [place]. [address] [place], [place] [address]  Culture, body fluid-bottle     Status: None (Preliminary result)   Collection Time: 09/20/17  3:54 PM  Result Value Ref Range Status   Specimen Description FLUID LEFT PLEURAL  Final   Special Requests NONE  Final   Culture   Final    NO GROWTH 4 DAYS Performed at [facility] [address] [place], [place] [address]    Report Status PENDING  Incomplete  Gram stain     Status: None   Collection Time: 09/20/17  3:54 PM  Result Value Ref Range Status   Specimen Description FLUID LEFT PLEURAL  Final   Special Requests NONE  Final   Gram Stain   Final    FEW WBC PRESENT,BOTH PMN AND MONONUCLEAR NO ORGANISMS SEEN Performed at [facility], [address] [place], [place] [address]    Report Status 09/20/2017 FINAL  Final    Studies:  No results found.  Assessment: 71 y.o. female with a history of hypertension, gout, hyperlipidemia and rheumatoid arthritis on methotrexate, presented with severe right hydronephrosis, fatigue, nausea and weakness.  1. Hypotension, sepsis vs Adrenal insufficiency, resolved now  2. Diffuse large B cell lymphoma, with bone marrow involvement, stage IV, IPS 4, high risk, s/p first cycle chemo R-CVP on 09/06/2017 2. Pancytopenia, secondary to #2 and chemo  3. Severe anasarca and hypoalbuminemia  4. RA, MTX on hold for now  5. HTN 6. Right hydronephrosis secondary to ureteral obstruction, status post nephrostomy tube placement 7. History of severe  hypercalcemia, resolved  8. Nausea and vomiting, anorexia  9. Severe calorie and protein malnutrition  10 dyspnea, bilateral pleural effusions, improved after thoracentesis on 10/4 and diuretics    Plan:  -Improving overall, responding to diuretics  -She is going to receive blood transfusion today for her worsening anemia, her thrombocytopenia has improved, previous neutropenia resolved. -She is due for second cycle chemotherapy this week, 10/11. If she is not going to be discharged (pending SNF) in the next few days, I will give the second cycle chemo during this hospitalization, likely intrathecal chemotherapy  tomorrow, Rituxan on Wednesday, and CHOP with dose reduction on 10/11 -I will talk to IR to see if they can internalize her nephrostomy tube this week  -will follow up closely   Truitt Merle, MD 09/24/2017  5:13 PM

## 2017-09-24 NOTE — Progress Notes (Addendum)
Patient ID: Kristina Torres, female   DOB: August 10, 1946, 71 y.o.   MRN: [id number]    PROGRESS NOTE  Kristina Torres  FKC:[id number] DOB: 1945/12/29 DOA: 09/10/2017  PCP: Kristina Kiel, MD   Brief Narrative:  Pt is 71 yo female with known DLBCL (started on chemo Cytoxan and Vincristine on 9/20), RA, HLD, HTN, recent hospitalization for right sided hydronephrosis and is now s/p perc nephrostomy tube placement and exchange on 08/31/2017 after tube malfunction, presented with weakness, found to be hypotensive with presumptive sepsis. In ED, pt has received boluses of IV NS and was admitted for further evaluation. She has initially required vasopressor support and received significant IVF resuscitation. Pressors were weaned and diuresis started, slowly improving.    Assessment & Plan: Hypotension: suspected distributive shock, third-spacing IVF's administered earlier this hospitalization. Has been off pressors since 09/14/2017 - SBP remains low, limiting diuresis, though continues to have good UOP. - No evidence of sepsis at this time. Blood cultures NGTD s/p 5 days abx.  - Taper stress steroids: 50mg  IV > 20mg  BID po 10/9  Acute respiratory distress: Suspect acute diastolic CHF with pleural effusions and parainfluenza virus.  - Improved with thoracentesis 10/4, 580cc transudate. Repeat imaging showing very small effusions, no indication for repeat.  - Continue lasix 40 mg IV QD, weight trend steadily improving. Was up >13L on 9/28 and has since continued negative balance. Weight has gone down to 206 from 216lbs at admission and a peak of 248lbs. - Continue breathing treatments as needed  Right-sided hydronephrosis: Thought to be due to extrinsic compression due to malignancy, now s/p replacement of perc nephrostomy tube 9/14. Output is good. - Discussed with urology, Dr. Gloriann Loan. Nephrostomy tube without clinical evidence of malfunction, so no indication to exchange at this time. Can remain in up to 3  months, so will have follow up with urology, Dr. Karsten Ro, as outpatient.  - Cause of hydronephrosis is being addressed with chemotherapy as below.  Acute on chronic diastolic CHF - Echocardiogram on 08/30/2017 shows grade 1 diastolic dysfunction. EF 60-65%. - Continue lasix 40mg  IV daily. Would not switch to po until anasarca improves dramatically as absorption would likely be impaired.  Hypokalemia and hypomagnesemia: - Supplemented, improved.   DLBCL: Incidentally noted regression of lymphadenopathy on CT chest after 1st cycle of chemotherapy.  - Appreciate heme/onc assistance. Chemo on hold for now.  Severe protein calorie malnutrition - Continue po supplementation.  Anemia and pancytopenia: Suspect chemotx induced, improving s/p G-CSF. Hgb trending downward without active bleeding. Currently without symptoms. - Hgb dropping slowly with marrow not likely to accommodate, so will give 1u PRBCs today to help with dyspnea.   Constipation: - Cont bowel regimen   Rheumatoid arthritis. - On methotrexate in the past currently not on any medication.  Morbid obesity  - Body mass index is 47 kg/m.  DVT prophylaxis: SCD's Code Status: Full Family Communication: Sister at bedside Disposition Plan: to be determined but when medically stable, will go to SNF. Requires continued IV diuresis. Maybe 2-3 more days.  Consultants:   Oncology  PCCM   Nephrology by phone 10/6.  Urology by phone 10/7  Procedures:   ECHO 9/13: - Left ventricle: The cavity size was normal. Wall thickness was   normal. Systolic function was normal. The estimated ejection   fraction was in the range of 60% to 65%. Wall motion was normal;   there were no regional wall motion abnormalities. Doppler   parameters are consistent with abnormal left ventricular  relaxation (grade 1 diastolic dysfunction). - Pulmonary arteries: Systolic pressure was mildly increased. PA   peak pressure: 36 mm Hg (S).    Antimicrobials:   Vanc and Zosyn x5 days   Subjective: Leg swelling better today than yesterday, but hand/arm swelling is completely gone. Dyspnea with associated cough and occasional "wheezing" continues. No chest pain.   Objective: Vitals:   09/23/17 2016 09/24/17 0609 09/24/17 0632 09/24/17 1500  BP: (!) 93/52 100/61  107/62  Pulse: 91 95  86  Resp: 16 18  18   Temp: 97.6 F (36.4 C) 97.7 F (36.5 C)  98.2 F (36.8 C)  TempSrc: Oral Oral  Oral  SpO2: 99% 98% 97% 99%  Weight:  93.8 kg (206 lb 12.7 oz)    Height:        Intake/Output Summary (Last 24 hours) at 09/24/17 1610 Last data filed at 09/24/17 1500  Gross per 24 hour  Intake              610 ml  Output             3175 ml  Net            -2565 ml   Filed Weights   09/22/17 0454 09/23/17 0700 09/24/17 0609  Weight: 100.4 kg (221 lb 5.5 oz) 96.5 kg (212 lb 11.9 oz) 93.8 kg (206 lb 12.7 oz)   Physical Exam  Constitutional: Appears calm ,NAD CVS: RRR no murmur or JVD. Significant edema in legs and abdominal wall.  Pulmonary: No crackles, diffuse expiratory rhonchi noted. Nonlabored on 1L by [place].  Abdominal: Soft. BS +, no distension, tenderness, rebound or guarding.  Neuro: Alert, oriented, normal speech, no focal deficits. Skin: Nephrostomy site without erythema/induration. No venous stasis changes. Diffuse pallor noted.   Data Reviewed: I have personally reviewed following labs and imaging studies  CBC:  Recent Labs Lab 09/19/17 0352 09/20/17 0409 09/21/17 0630 09/22/17 0334 09/24/17 0359  WBC 24.2* 20.4* 17.0* 14.9* 9.4  HGB 8.1* 8.3* 8.0* 7.4* 7.3*  HCT 23.9* 24.6* 24.2* 22.4* 22.5*  MCV 93.0 92.5 95.3 95.3 97.4  PLT 65* 64* 65* 68* 660*   Basic Metabolic Panel:  Recent Labs Lab 09/18/17 0351 09/19/17 0352 09/20/17 0409 09/21/17 0630 09/24/17 0359  NA 137 138 139 138 138  K 2.7* 3.0* 3.6 4.0 4.1  CL 107 107 107 107 103  CO2 24 25 27 28 29   GLUCOSE 138* 129* 113* 97 82  BUN 35* 34*  32* 33* 36*  CREATININE 0.78 0.65 0.57 0.48 0.54  CALCIUM 7.5* 7.3* 7.3* 7.2* 7.2*  MG 1.6* 1.8 2.0  --   --    Liver Function Tests: No results for input(s): AST, ALT, ALKPHOS, BILITOT, PROT, ALBUMIN in the last 168 hours. Coagulation Profile: No results for input(s): INR, PROTIME in the last 168 hours. CBG:  Recent Labs Lab 09/19/17 1132  GLUCAP 145*   Urine analysis:    Component Value Date/Time   COLORURINE YELLOW 09/11/2017 1650   APPEARANCEUR TURBID (A) 09/11/2017 1650   LABSPEC 1.018 09/11/2017 1650   PHURINE 5.0 09/11/2017 1650   GLUCOSEU NEGATIVE 09/11/2017 1650   HGBUR LARGE (A) 09/11/2017 1650   BILIRUBINUR NEGATIVE 09/11/2017 1650   KETONESUR NEGATIVE 09/11/2017 1650   PROTEINUR NEGATIVE 09/11/2017 1650   NITRITE NEGATIVE 09/11/2017 1650   LEUKOCYTESUR TRACE (A) 09/11/2017 1650   Recent Results (from the past 240 hour(s))  Respiratory Panel by PCR     Status: Abnormal  Collection Time: 09/18/17  9:54 AM  Result Value Ref Range Status   Adenovirus NOT DETECTED NOT DETECTED Final   Coronavirus 229E NOT DETECTED NOT DETECTED Final   Coronavirus HKU1 NOT DETECTED NOT DETECTED Final   Coronavirus NL63 NOT DETECTED NOT DETECTED Final   Coronavirus OC43 NOT DETECTED NOT DETECTED Final   Metapneumovirus NOT DETECTED NOT DETECTED Final   Rhinovirus / Enterovirus NOT DETECTED NOT DETECTED Final   Influenza A NOT DETECTED NOT DETECTED Final   Influenza B NOT DETECTED NOT DETECTED Final   Parainfluenza Virus 1 NOT DETECTED NOT DETECTED Final   Parainfluenza Virus 2 DETECTED (A) NOT DETECTED Final   Parainfluenza Virus 3 NOT DETECTED NOT DETECTED Final   Parainfluenza Virus 4 NOT DETECTED NOT DETECTED Final   Respiratory Syncytial Virus NOT DETECTED NOT DETECTED Final   Bordetella pertussis NOT DETECTED NOT DETECTED Final   Chlamydophila pneumoniae NOT DETECTED NOT DETECTED Final   Mycoplasma pneumoniae NOT DETECTED NOT DETECTED Final    Comment: Performed at [facility], [place] [address] [place], [place] [address]  Culture, body fluid-bottle     Status: None (Preliminary result)   Collection Time: 09/20/17  3:54 PM  Result Value Ref Range Status   Specimen Description FLUID LEFT PLEURAL  Final   Special Requests NONE  Final   Culture   Final    NO GROWTH 4 DAYS Performed at [facility] [address] [place], [place] [address]    Report Status PENDING  Incomplete  Gram stain     Status: None   Collection Time: 09/20/17  3:54 PM  Result Value Ref Range Status   Specimen Description FLUID LEFT PLEURAL  Final   Special Requests NONE  Final   Gram Stain   Final    FEW WBC PRESENT,BOTH PMN AND MONONUCLEAR NO ORGANISMS SEEN Performed at [facility], [address], [place], [place] [address]    Report Status 09/20/2017 FINAL  Final    Radiology Studies: Ct Chest Wo Contrast Result Date: 09/19/2017 1. Regression of previously noted lymphadenopathy indicative of a positive response to therapy.  2. New patchy areas of mid to upper lung predominant ground-glass attenuation and interlobular septal thickening. This is nonspecific, but given the normal cardiac size, this does not appear to reflect cardiogenic pulmonary edema. Noncardiogenic edema, alveolar hemorrhage and/or acute atypical infection are the primary differential considerations.  3. Increasing moderate bilateral pleural effusions, large volume of ascites, and diffuse body wall edema, suggestive of a state of underlying anasarca.  4. Aortic atherosclerosis, in addition to left anterior descending coronary artery disease. Assessment for potential risk factor modification, dietary therapy or pharmacologic therapy may be warranted, if clinically indicated.  5. Mild diffuse bronchial wall thickening with mild centrilobular and paraseptal emphysema; imaging findings suggestive of underlying COPD.    Dg Chest Port 1 View Result Date: 09/20/2017 Result Date:  09/20/2017 Successful ultrasound guided diagnostic and therapeutic left thoracentesis yielding 580 cc of pleural fluid.  Scheduled Meds: . albuterol  2.5 mg Nebulization TID  . benzonatate  100 mg Oral TID  . Chlorhexidine Gluconate Cloth  6 each Topical q morning - 10a  . furosemide  40 mg Intravenous Daily  . hydrocortisone sod succinate (SOLU-CORTEF) inj  50 mg Intravenous Daily  . pantoprazole  40 mg Oral Daily  . potassium chloride  40 mEq Oral BID  . protein supplement shake  2 oz Oral QID   Continuous Infusions: .  sodium chloride       LOS: 13 days   Time spent: 25 minutes   Vance Gather, MD [facility] Pager [phone]  If 7PM-7AM, please contact night-coverage [email or web address] Password [email or web address] 09/24/2017, 4:10 PM

## 2017-09-24 NOTE — Progress Notes (Signed)
CSW following to assist with disposition.  Planning for SNF at DC. Pt was at [facility] x 3 days prior to this admission and will not return as husband states,  "Her nephrostomy was not cared for well there." Had wanted [facility] however both locations denied referral due to pt "being medically complicated and poor rehab potential." Expanded SNF search and other bed offers have been made.  Will provide husband with bed offers and follow.  Sharren Bridge, MSW, LCSW Clinical Social Work 09/24/2017 [phone]

## 2017-09-25 ENCOUNTER — Inpatient Hospital Stay (HOSPITAL_COMMUNITY): Payer: Medicare Other

## 2017-09-25 LAB — CSF CELL COUNT WITH DIFFERENTIAL
EOS CSF: 0 % (ref 0–1)
Eosinophils, CSF: 0 % (ref 0–1)
Lymphs, CSF: 92 % — ABNORMAL HIGH (ref 40–80)
Lymphs, CSF: 92 % — ABNORMAL HIGH (ref 40–80)
MONOCYTE-MACROPHAGE-SPINAL FLUID: 5 % — AB (ref 15–45)
MONOCYTE-MACROPHAGE-SPINAL FLUID: 7 % — AB (ref 15–45)
RBC COUNT CSF: 9 /mm3 — AB
RBC Count, CSF: 219 /mm3 — ABNORMAL HIGH
Segmented Neutrophils-CSF: 1 % (ref 0–6)
Segmented Neutrophils-CSF: 3 % (ref 0–6)
Tube #: 1
Tube #: 4
WBC, CSF: 15 /mm3 (ref 0–5)
WBC, CSF: 17 /mm3 (ref 0–5)

## 2017-09-25 LAB — CBC
HCT: 26.6 % — ABNORMAL LOW (ref 36.0–46.0)
HEMOGLOBIN: 8.8 g/dL — AB (ref 12.0–15.0)
MCH: 31.2 pg (ref 26.0–34.0)
MCHC: 33.1 g/dL (ref 30.0–36.0)
MCV: 94.3 fL (ref 78.0–100.0)
PLATELETS: 106 10*3/uL — AB (ref 150–400)
RBC: 2.82 MIL/uL — AB (ref 3.87–5.11)
RDW: 22.6 % — ABNORMAL HIGH (ref 11.5–15.5)
WBC: 8.7 10*3/uL (ref 4.0–10.5)

## 2017-09-25 LAB — COMPREHENSIVE METABOLIC PANEL
ALT: 27 U/L (ref 14–54)
ANION GAP: 6 (ref 5–15)
AST: 24 U/L (ref 15–41)
Albumin: 1.8 g/dL — ABNORMAL LOW (ref 3.5–5.0)
Alkaline Phosphatase: 121 U/L (ref 38–126)
BUN: 32 mg/dL — ABNORMAL HIGH (ref 6–20)
CALCIUM: 7.3 mg/dL — AB (ref 8.9–10.3)
CHLORIDE: 107 mmol/L (ref 101–111)
CO2: 28 mmol/L (ref 22–32)
CREATININE: 0.55 mg/dL (ref 0.44–1.00)
Glucose, Bld: 85 mg/dL (ref 65–99)
Potassium: 4 mmol/L (ref 3.5–5.1)
Sodium: 141 mmol/L (ref 135–145)
Total Bilirubin: 0.8 mg/dL (ref 0.3–1.2)
Total Protein: 4.2 g/dL — ABNORMAL LOW (ref 6.5–8.1)

## 2017-09-25 LAB — BPAM RBC
BLOOD PRODUCT EXPIRATION DATE: 201810192359
ISSUE DATE / TIME: 201810081743
Unit Type and Rh: 1700

## 2017-09-25 LAB — TYPE AND SCREEN
ABO/RH(D): B NEG
Antibody Screen: NEGATIVE
UNIT DIVISION: 0

## 2017-09-25 LAB — GLUCOSE, CSF: Glucose, CSF: 45 mg/dL (ref 40–70)

## 2017-09-25 LAB — CULTURE, BODY FLUID W GRAM STAIN -BOTTLE

## 2017-09-25 LAB — PROTEIN, CSF: TOTAL PROTEIN, CSF: 90 mg/dL — AB (ref 15–45)

## 2017-09-25 LAB — CULTURE, BODY FLUID-BOTTLE: CULTURE: NO GROWTH

## 2017-09-25 MED ORDER — SODIUM CHLORIDE 0.9 % IJ SOLN
Freq: Once | INTRAMUSCULAR | Status: AC
  Administered 2017-09-25: 12:00:00 via INTRATHECAL
  Filled 2017-09-25: qty 0.48

## 2017-09-25 MED ORDER — LIDOCAINE HCL 1 % IJ SOLN
INTRAMUSCULAR | Status: AC
  Filled 2017-09-25: qty 20

## 2017-09-25 MED ORDER — PREDNISONE 20 MG PO TABS
60.0000 mg | ORAL_TABLET | Freq: Every day | ORAL | Status: AC
  Administered 2017-09-26 – 2017-09-30 (×5): 60 mg via ORAL
  Filled 2017-09-25 (×5): qty 3

## 2017-09-25 NOTE — Progress Notes (Signed)
Nutrition Follow-up  DOCUMENTATION CODES:   Morbid obesity  INTERVENTION:   Provide Premier Protein TID, each supplement provides 160kcal and 30g protein.   NUTRITION DIAGNOSIS:   Increased nutrient needs related to catabolic illness, cancer and cancer related treatments as evidenced by estimated needs.  Ongoing  GOAL:   Patient will meet greater than or equal to 90% of their needs  Progressing  MONITOR:   PO intake, Supplement acceptance, Diet advancement, Weight trends, Labs, Skin  REASON FOR ASSESSMENT:   Consult Assessment of nutrition requirement/status  ASSESSMENT:   71 y.o. Female with history of multiple medical problems including rheumatoid arthritis, hyperlipidemia, history of hypertension, and diffuse large B-cell lymphoma. Patient recently hospitalized with right-sided hydronephrosis status post percutaneous nephrostomy tube and exchange on 9/14 after tube malfunction. Patient started on chemotherapy with Cytoxan and vincristine on 9/20 and subsequently discharged to rehabilitation on 9/22. Patient's tube was fractured at her nursing facility and patient was subsequently transferred to [facility] for evaluation.  Pt in for procedure at time of visit. Spoke with sister at bedside. Sister reports pt's appetite is slowly getting better. Pt is having a hard time adjusting to low salt diet, often skipping some foods items due to lack of flavor. Meal completions charted as 66% of last 8 meals. Pt drinking 2-3 Premier protein supplements brought in by family as she cannot tolerate the taste of flavors provided by hospital. RD stressed the importance of protein during and after hospital stay. Wt noted to be down 17 lb since last RD visit 10/2. Suspect mostly fluid loss. Will continue to monitor.   Medications reviewed and include: lasix, KCl Labs reviewed: BUN 32 (H)   Diet Order:  Diet NPO time specified  Skin:  Reviewed, no issues  Last BM:  09/24/17  Height:   Ht  Readings from Last 1 Encounters:  09/10/17 5' (1.524 m)    Weight:   Wt Readings from Last 1 Encounters:  09/25/17 208 lb 5.4 oz (94.5 kg)    Ideal Body Weight:  47.73 kg  BMI:  Body mass index is 43.54 kg/m.  Estimated Nutritional Needs:   Kcal:  2010-2215 (20-22 kcal/kg)  Protein:  100-110 grams  Fluid:  1.6-1.8 L/day  EDUCATION NEEDS:   No education needs identified at this time  [place], LDN Clinical Nutrition Pager # - [phone]

## 2017-09-25 NOTE — Progress Notes (Signed)
Kristina Torres   DOB:1946-08-11   MV#:[id number]   MWU#:[id number]  Heme/onc follow-up  Subjective: Kristina Torres underwent LP and IT chemo this afternoon, tolerated the procedure well. No other new complains.    Objective:  Vitals:   09/25/17 1754 09/25/17 2047  BP: (!) 100/58   Pulse: 77   Resp: 18   Temp: 98.1 F (36.7 C)   SpO2: 100% 98%    Body mass index is 43.54 kg/m.  Intake/Output Summary (Last 24 hours) at 09/25/17 2239 Last data filed at 09/25/17 1700  Gross per 24 hour  Intake                5 ml  Output             3500 ml  Net            -3495 ml     Sclerae unicteric  Oropharynx clear  No peripheral adenopathy  Right lung clear, decreased breath sounds in left lung mild wheezing  Heart regular rate and rhythm  Abdomen benign  MSK diffuse anasarca  Neuro nonfocal, awake and alert  CBG (last 3)  No results for input(s): GLUCAP in the last 72 hours.   Labs:  Lab Results  Component Value Date   WBC 8.7 09/25/2017   HGB 8.8 (L) 09/25/2017   HCT 26.6 (L) 09/25/2017   MCV 94.3 09/25/2017   PLT 106 (L) 09/25/2017   NEUTROABS 9.4 (H) 09/17/2017    Urine Studies No results for input(s): UHGB, CRYS in the last 72 hours.  Invalid input(s): UACOL, UAPR, USPG, UPH, UTP, UGL, UKET, UBIL, UNIT, UROB, [place], UEPI, UWBC, Duwayne Heck [place], [place]  Basic Metabolic Panel:  Recent Labs Lab 09/19/17 0352 09/20/17 0409 09/21/17 0630 09/24/17 0359 09/25/17 0523  NA 138 139 138 138 141  K 3.0* 3.6 4.0 4.1 4.0  CL 107 107 107 103 107  CO2 25 27 28 29 28   GLUCOSE 129* 113* 97 82 85  BUN 34* 32* 33* 36* 32*  CREATININE 0.65 0.57 0.48 0.54 0.55  CALCIUM 7.3* 7.3* 7.2* 7.2* 7.3*  MG 1.8 2.0  --   --   --    GFR Estimated Creatinine Clearance: 66.3 mL/min (by C-G formula based on SCr of 0.55 mg/dL). Liver Function Tests:  Recent Labs Lab 09/25/17 0523  AST 24  ALT 27  ALKPHOS 121  BILITOT 0.8  PROT 4.2*  ALBUMIN 1.8*   No results for input(s): LIPASE,  AMYLASE in the last 168 hours. No results for input(s): AMMONIA in the last 168 hours. Coagulation profile No results for input(s): INR, PROTIME in the last 168 hours.  CBC:  Recent Labs Lab 09/20/17 0409 09/21/17 0630 09/22/17 0334 09/24/17 0359 09/25/17 0523  WBC 20.4* 17.0* 14.9* 9.4 8.7  HGB 8.3* 8.0* 7.4* 7.3* 8.8*  HCT 24.6* 24.2* 22.4* 22.5* 26.6*  MCV 92.5 95.3 95.3 97.4 94.3  PLT 64* 65* 68* 106* 106*   Cardiac Enzymes: No results for input(s): CKTOTAL, CKMB, CKMBINDEX, TROPONINI in the last 168 hours. BNP: Invalid input(s): POCBNP CBG:  Recent Labs Lab 09/19/17 1132  GLUCAP 145*   D-Dimer No results for input(s): DDIMER in the last 72 hours. Hgb A1c No results for input(s): HGBA1C in the last 72 hours. Lipid Profile No results for input(s): CHOL, HDL, LDLCALC, TRIG, CHOLHDL, LDLDIRECT in the last 72 hours. Thyroid function studies No results for input(s): TSH, T4TOTAL, T3FREE, THYROIDAB in the last 72 hours.  Invalid input(s): FREET3 Anemia  work up No results for input(s): VITAMINB12, FOLATE, FERRITIN, TIBC, IRON, RETICCTPCT in the last 72 hours. Microbiology Recent Results (from the past 240 hour(s))  Respiratory Panel by PCR     Status: Abnormal   Collection Time: 09/18/17  9:54 AM  Result Value Ref Range Status   Adenovirus NOT DETECTED NOT DETECTED Final   Coronavirus 229E NOT DETECTED NOT DETECTED Final   Coronavirus HKU1 NOT DETECTED NOT DETECTED Final   Coronavirus NL63 NOT DETECTED NOT DETECTED Final   Coronavirus OC43 NOT DETECTED NOT DETECTED Final   Metapneumovirus NOT DETECTED NOT DETECTED Final   Rhinovirus / Enterovirus NOT DETECTED NOT DETECTED Final   Influenza A NOT DETECTED NOT DETECTED Final   Influenza B NOT DETECTED NOT DETECTED Final   Parainfluenza Virus 1 NOT DETECTED NOT DETECTED Final   Parainfluenza Virus 2 DETECTED (A) NOT DETECTED Final   Parainfluenza Virus 3 NOT DETECTED NOT DETECTED Final   Parainfluenza Virus 4 NOT  DETECTED NOT DETECTED Final   Respiratory Syncytial Virus NOT DETECTED NOT DETECTED Final   Bordetella pertussis NOT DETECTED NOT DETECTED Final   Chlamydophila pneumoniae NOT DETECTED NOT DETECTED Final   Mycoplasma pneumoniae NOT DETECTED NOT DETECTED Final    Comment: Performed at [facility], [place]. [address], [place], [place] [address]  Culture, body fluid-bottle     Status: None   Collection Time: 09/20/17  3:54 PM  Result Value Ref Range Status   Specimen Description FLUID LEFT PLEURAL  Final   Special Requests NONE  Final   Culture   Final    NO GROWTH 5 DAYS Performed at [place] [place] [address], [place], [place] [address]    Report Status 09/25/2017 FINAL  Final  Gram stain     Status: None   Collection Time: 09/20/17  3:54 PM  Result Value Ref Range Status   Specimen Description FLUID LEFT PLEURAL  Final   Special Requests NONE  Final   Gram Stain   Final    FEW WBC PRESENT,BOTH PMN AND MONONUCLEAR NO ORGANISMS SEEN Performed at [facility], [address], [place], [place] [address]    Report Status 09/20/2017 FINAL  Final    Studies:  Dg Fluoro Guide Lumbar Puncture  Result Date: 09/25/2017 CLINICAL DATA:  71 year old female with lymphoma. Subsequent encounter. EXAM: DIAGNOSTIC LUMBAR PUNCTURE UNDER FLUOROSCOPIC GUIDANCE FLUOROSCOPY TIME:  Fluoroscopy Time:  40 second Radiation Exposure Index 16.4 mGy PROCEDURE: Case discussed with Dr. Burr Medico.  Labs and imaging checked. Informed consent was obtained from the patient prior to the procedure, including potential complications of headache, infection, bleeding or drug reaction. Time-out performed. With the patient prone, the lower back was prepped with Betadine. 1% Lidocaine was used for local anesthesia. Lumbar puncture was performed at the L3-4 level with a single pass of a 22 gauge needle with return of 5 CSF sent for labs as per request. Pharmacy prepared and 12 mg methotrexate with 50 mg hydrocortisone  within 5 cc of fluid was slowly instilled. Postprocedure instructions reviewed with the patient. The patient tolerated the procedure well and there were no apparent complications. IMPRESSION: L3-4 lumbar puncture with installation of chemotherapy. No immediate complication. Electronically Signed   By: Genia Del M.D.   On: 09/25/2017 12:30    Assessment: 71 y.o. female with a history of hypertension, gout, hyperlipidemia and rheumatoid arthritis on methotrexate, presented with severe right hydronephrosis, fatigue, nausea and weakness.   1. Diffuse large B cell  lymphoma, with bone marrow involvement, stage IV, IPS 4, high risk, s/p first cycle chemo R-CVP on 09/06/2017 2. Pancytopenia, secondary to #2 and chemo  3. Severe anasarca and hypoalbuminemia  4. RA, MTX on hold for now  5. HTN 6. Right hydronephrosis secondary to ureteral obstruction, status post nephrostomy tube placement 7. History of severe hypercalcemia, resolved  8. Nausea and vomiting, anorexia  9. Severe calorie and protein malnutrition  10 dyspnea, bilateral pleural effusions, improved after thoracentesis on 10/4 and diuretics    Plan:  -Pt had first IT MTX on 10/9, CSF cytology pending -plan to start cycle 2 chemo tomorrow. Due to her reaction during the first cycle, will do rituximab first infusion protocol tomorrow -plan to give dose reduced CHOP on 10/11 (complete on same day), except prednisone starts tomorrow 10/10, for 5 days, order is in, I cancelled hydrocortisone  -continue diuretics per primary team  -SNF placement pending  -I encourage her to do PT/OT when she is here -continue supportive care, blood transfusion if Hg<8.0, please use irradiated blood products  -will continue f/u    Truitt Merle, MD 09/25/2017

## 2017-09-25 NOTE — CV Procedure (Signed)
Procedure discussed with Dr. Burr Medico.  Labs and Imaging reviewed.  Procedure and associated risks reviewed with patient.  Questions answered.  Written witnessed consent obtained.  Time out performed.  Under sterile technique and fluor guidance L3-4 LP performed with one pass 22g spinal needle.  5 cc clear CSF collected and sent for labs as per request.  Pharmacy prepared 12mg  methotrexate/steriod solution slowly instilled.    Reviewed post procedure instructions.  No immediate complications.

## 2017-09-25 NOTE — Progress Notes (Signed)
Patient ID: Kristina Torres, female   DOB: 1946/06/19, 71 y.o.   MRN: [id number]    PROGRESS NOTE  Kristina Torres  KVQ:[id number] DOB: [birth year] DOA: 09/10/2017  PCP: [name], MD   Brief Narrative:  Pt is 71 yo female with known DLBCL (started on chemo Cytoxan and Vincristine on 9/20), RA, HLD, HTN, recent hospitalization for right sided hydronephrosis and is now s/p perc nephrostomy tube placement and exchange on 08/31/2017 after tube malfunction, presented with weakness, found to be hypotensive with presumptive sepsis. In ED, pt has received boluses of IV NS and was admitted for further evaluation. She has initially required vasopressor support and received significant IVF resuscitation. Pressors were weaned and diuresis started, slowly improving.    Assessment & Plan: Hypotension: suspected distributive shock, third-spacing IVF's administered earlier this hospitalization. Has been off pressors since 09/14/2017 - Improving. Limiting diuresis, though continues to have good UOP. - No evidence of sepsis at this time. Blood cultures NGTD s/p 5 days abx.  - Taper stress steroids: 50mg  IV > 20mg  BID po 10/9  Acute respiratory distress: Suspect acute diastolic CHF with pleural effusions and parainfluenza virus.  - Improved with thoracentesis 10/4, 580cc transudate. Repeat imaging showing very small effusions, no indication for repeat.  - Continue breathing treatments as needed  Right-sided hydronephrosis: Thought to be due to extrinsic compression due to malignancy, now s/p replacement of perc nephrostomy tube 9/14. Output is good. - Discussed with urology, Dr. Gloriann Loan. Nephrostomy tube without clinical evidence of malfunction, so no indication to exchange at this time. Can remain in up to 3 months, so will have follow up with urology, Dr. Karsten Ro, as outpatient.  - Cause of hydronephrosis is being addressed with chemotherapy as below.  Acute on chronic diastolic CHF: Echocardiogram on  08/30/2017 shows grade 1 diastolic dysfunction. EF 60-65%. - - Continue lasix 40 mg IV QD, weight trend steadily improving, consistently >2500cc/day out. Was up >13L on 9/28 and has since continued negative balance. Weight has gone down to 206 from 216lbs at admission and a peak of 248lbs. Would not switch to po until anasarca improves dramatically as absorption would likely be impaired. - DC foley, use external catheter to maintain strict I/O.   Hypokalemia and hypomagnesemia: - Supplemented, improved.   DLBCL: Incidentally noted regression of lymphadenopathy on CT chest after 1st cycle of chemotherapy.  - Appreciate heme/onc assistance: Planning chemo 10/10-10/11. Intrathecal MTX 10/9.   Severe protein calorie malnutrition in setting of morbid obesity: - Continue po supplementation. Hoping to improve lean body mass.   Anemia and pancytopenia: Suspect chemotx induced, improving s/p G-CSF. Hgb trending downward without active bleeding. Currently without symptoms. - 1u PRBCs 10/8.  Constipation: - Cont bowel regimen   Rheumatoid arthritis. - On methotrexate in the past currently not on any medication.  DVT prophylaxis: SCD's Code Status: Full Family Communication: Sister at bedside Disposition Plan: Transfer to Onc floor 10/9 for chemotherapy. Continue IV diuresis and DC to SNF once anasarca resolved, maybe 2-3 more days.  Consultants:   Oncology  PCCM   Nephrology by phone 10/6.  Urology by phone 10/7  Procedures:   ECHO 9/13: - Left ventricle: The cavity size was normal. Wall thickness was   normal. Systolic function was normal. The estimated ejection   fraction was in the range of 60% to 65%. Wall motion was normal;   there were no regional wall motion abnormalities. Doppler   parameters are consistent with abnormal left ventricular   relaxation (grade 1 diastolic  dysfunction). - Pulmonary arteries: Systolic pressure was mildly increased. PA   peak pressure: 36 mm  Hg (S).   Antimicrobials:   Vanc and Zosyn x5 days   Subjective: Tolerated LP, but got more dyspneic when laying prone. No wheezing noted. Less wheezing/rhonchi on RN assessment. No chest pain. Tolerated transfusion as well.  Objective: Vitals:   09/25/17 0434 09/25/17 0459 09/25/17 0834 09/25/17 1338  BP: 109/69   109/71  Pulse: 80   90  Resp: 19   18  Temp: 98.3 F (36.8 C)   99 F (37.2 C)  TempSrc: Oral   Oral  SpO2: 98%  97% 100%  Weight:  94.5 kg (208 lb 5.4 oz)    Height:        Intake/Output Summary (Last 24 hours) at 09/25/17 1427 Last data filed at 09/25/17 1341  Gross per 24 hour  Intake           439.33 ml  Output             3925 ml  Net         -3485.67 ml   Filed Weights   09/23/17 0700 09/24/17 0609 09/25/17 0459  Weight: 96.5 kg (212 lb 11.9 oz) 93.8 kg (206 lb 12.7 oz) 94.5 kg (208 lb 5.4 oz)   Constitutional: Appears calm ,NAD CVS: RRR no murmur or JVD. 3+ Pitting LE edema symmetrically extending to abdomen. Pulmonary: No crackles, scattered rhonchi less severe than previously. Nonlabored on 2L by [place].  Abdominal: Soft. BS +, no distension, tenderness, rebound or guarding.  Neuro: Alert, oriented, normal speech, no focal deficits. Skin: Nephrostomy site without erythema/induration. No venous stasis changes. Diffuse pallor noted.   CBC:  Recent Labs Lab 09/20/17 0409 09/21/17 0630 09/22/17 0334 09/24/17 0359 09/25/17 0523  WBC 20.4* 17.0* 14.9* 9.4 8.7  HGB 8.3* 8.0* 7.4* 7.3* 8.8*  HCT 24.6* 24.2* 22.4* 22.5* 26.6*  MCV 92.5 95.3 95.3 97.4 94.3  PLT 64* 65* 68* 106* 814*   Basic Metabolic Panel:  Recent Labs Lab 09/19/17 0352 09/20/17 0409 09/21/17 0630 09/24/17 0359 09/25/17 0523  NA 138 139 138 138 141  K 3.0* 3.6 4.0 4.1 4.0  CL 107 107 107 103 107  CO2 25 27 28 29 28   GLUCOSE 129* 113* 97 82 85  BUN 34* 32* 33* 36* 32*  CREATININE 0.65 0.57 0.48 0.54 0.55  CALCIUM 7.3* 7.3* 7.2* 7.2* 7.3*  MG 1.8 2.0  --   --   --     Liver Function Tests:  Recent Labs Lab 09/25/17 0523  AST 24  ALT 27  ALKPHOS 121  BILITOT 0.8  PROT 4.2*  ALBUMIN 1.8*   Urine analysis:    Component Value Date/Time   COLORURINE YELLOW 09/11/2017 1650   APPEARANCEUR TURBID (A) 09/11/2017 1650   LABSPEC 1.018 09/11/2017 1650   PHURINE 5.0 09/11/2017 1650   GLUCOSEU NEGATIVE 09/11/2017 1650   HGBUR LARGE (A) 09/11/2017 1650   BILIRUBINUR NEGATIVE 09/11/2017 1650   KETONESUR NEGATIVE 09/11/2017 1650   PROTEINUR NEGATIVE 09/11/2017 1650   NITRITE NEGATIVE 09/11/2017 1650   LEUKOCYTESUR TRACE (A) 09/11/2017 1650   Recent Results (from the past 240 hour(s))  Respiratory Panel by PCR     Status: Abnormal   Collection Time: 09/18/17  9:54 AM  Result Value Ref Range Status   Adenovirus NOT DETECTED NOT DETECTED Final   Coronavirus 229E NOT DETECTED NOT DETECTED Final   Coronavirus HKU1 NOT DETECTED NOT DETECTED Final  Coronavirus NL63 NOT DETECTED NOT DETECTED Final   Coronavirus OC43 NOT DETECTED NOT DETECTED Final   Metapneumovirus NOT DETECTED NOT DETECTED Final   Rhinovirus / Enterovirus NOT DETECTED NOT DETECTED Final   Influenza A NOT DETECTED NOT DETECTED Final   Influenza B NOT DETECTED NOT DETECTED Final   Parainfluenza Virus 1 NOT DETECTED NOT DETECTED Final   Parainfluenza Virus 2 DETECTED (A) NOT DETECTED Final   Parainfluenza Virus 3 NOT DETECTED NOT DETECTED Final   Parainfluenza Virus 4 NOT DETECTED NOT DETECTED Final   Respiratory Syncytial Virus NOT DETECTED NOT DETECTED Final   Bordetella pertussis NOT DETECTED NOT DETECTED Final   Chlamydophila pneumoniae NOT DETECTED NOT DETECTED Final   Mycoplasma pneumoniae NOT DETECTED NOT DETECTED Final    Comment: Performed at [facility], [place] [address], [place], [place] [address]  Culture, body fluid-bottle     Status: None (Preliminary result)   Collection Time: 09/20/17  3:54 PM  Result Value Ref Range Status   Specimen Description FLUID LEFT  PLEURAL  Final   Special Requests NONE  Final   Culture   Final    NO GROWTH 4 DAYS Performed at [facility] [address], [place], [place] [address]    Report Status PENDING  Incomplete  Gram stain     Status: None   Collection Time: 09/20/17  3:54 PM  Result Value Ref Range Status   Specimen Description FLUID LEFT PLEURAL  Final   Special Requests NONE  Final   Gram Stain   Final    FEW WBC PRESENT,BOTH PMN AND MONONUCLEAR NO ORGANISMS SEEN Performed at [facility], [address] [place], [place] [address]    Report Status 09/20/2017 FINAL  Final    Radiology Studies: Ct Chest Wo Contrast Result Date: 09/19/2017 1. Regression of previously noted lymphadenopathy indicative of a positive response to therapy.  2. New patchy areas of mid to upper lung predominant ground-glass attenuation and interlobular septal thickening. This is nonspecific, but given the normal cardiac size, this does not appear to reflect cardiogenic pulmonary edema. Noncardiogenic edema, alveolar hemorrhage and/or acute atypical infection are the primary differential considerations.  3. Increasing moderate bilateral pleural effusions, large volume of ascites, and diffuse body wall edema, suggestive of a state of underlying anasarca.  4. Aortic atherosclerosis, in addition to left anterior descending coronary artery disease. Assessment for potential risk factor modification, dietary therapy or pharmacologic therapy may be warranted, if clinically indicated.  5. Mild diffuse bronchial wall thickening with mild centrilobular and paraseptal emphysema; imaging findings suggestive of underlying COPD.    Dg Chest Port 1 View Result Date: 09/20/2017 Result Date: 09/20/2017 Successful ultrasound guided diagnostic and therapeutic left thoracentesis yielding 580 cc of pleural fluid.  Scheduled Meds: . albuterol  2.5 mg Nebulization TID  . benzonatate  100 mg Oral TID  . Chlorhexidine Gluconate Cloth  6  each Topical q morning - 10a  . furosemide  40 mg Intravenous Daily  . hydrocortisone  20 mg Oral BID  . lidocaine      . pantoprazole  40 mg Oral Daily  . potassium chloride  40 mEq Oral BID  . protein supplement shake  2 oz Oral QID   Continuous Infusions:    LOS: 14 days   Time spent: 25 minutes   Vance Gather, MD [facility] Pager [phone]  If 7PM-7AM, please contact night-coverage [email or web address] Password [email or web address] 09/25/2017, 2:26 PM

## 2017-09-25 NOTE — Progress Notes (Signed)
PT Cancellation Note  Patient Details Name: Kristina Torres MRN: [id number] DOB: 29-Oct-1946   Cancelled Treatment:     CHEMO in am then procedure in PM which pt had to remain flat rest of day.  Will attempt to see another day as schedule permits.   Rica Koyanagi  PTA [facility]  Acute  Rehab Pager      [phone]

## 2017-09-25 NOTE — Progress Notes (Signed)
CSW following for disposition. Today met with pt and family who report they are interested in [facility] in [place] at [place] (should SNF still be appropriate at [place]). CSW made referral and left message with admissions. Will continue following throughout hospitalization.   Sharren Bridge, MSW, LCSW Clinical Social Work 09/25/2017 [phone]

## 2017-09-26 LAB — CBC
HCT: 26.4 % — ABNORMAL LOW (ref 36.0–46.0)
Hemoglobin: 8.6 g/dL — ABNORMAL LOW (ref 12.0–15.0)
MCH: 31 pg (ref 26.0–34.0)
MCHC: 32.6 g/dL (ref 30.0–36.0)
MCV: 95.3 fL (ref 78.0–100.0)
PLATELETS: 126 10*3/uL — AB (ref 150–400)
RBC: 2.77 MIL/uL — ABNORMAL LOW (ref 3.87–5.11)
RDW: 22.3 % — AB (ref 11.5–15.5)
WBC: 6.5 10*3/uL (ref 4.0–10.5)

## 2017-09-26 MED ORDER — ACETAMINOPHEN 325 MG PO TABS
650.0000 mg | ORAL_TABLET | Freq: Once | ORAL | Status: AC
  Administered 2017-09-26: 650 mg via ORAL
  Filled 2017-09-26: qty 2

## 2017-09-26 MED ORDER — SODIUM CHLORIDE 0.9 % IV SOLN
375.0000 mg/m2 | Freq: Once | INTRAVENOUS | Status: AC
  Administered 2017-09-26: 700 mg via INTRAVENOUS
  Filled 2017-09-26: qty 20

## 2017-09-26 MED ORDER — FUROSEMIDE 10 MG/ML IJ SOLN
40.0000 mg | Freq: Two times a day (BID) | INTRAMUSCULAR | Status: DC
  Administered 2017-09-26 – 2017-10-03 (×14): 40 mg via INTRAVENOUS
  Filled 2017-09-26 (×14): qty 4

## 2017-09-26 MED ORDER — DIPHENHYDRAMINE HCL 50 MG PO CAPS
50.0000 mg | ORAL_CAPSULE | Freq: Once | ORAL | Status: AC
  Administered 2017-09-26: 50 mg via ORAL
  Filled 2017-09-26: qty 1

## 2017-09-26 NOTE — Progress Notes (Signed)
Rituxan infusion dosage calculation verified by Aldean Baker, RN.

## 2017-09-26 NOTE — Progress Notes (Signed)
Patient ID: Kristina Torres, female   DOB: August 05, 1946, 71 y.o.   MRN: [id number]    PROGRESS NOTE  Kristina Torres  DTO:[id number] DOB: 12/01/1946 DOA: 09/10/2017  PCP: [name], MD   Brief Narrative:  Pt is 71 yo female with known DLBCL (started on chemo Cytoxan and Vincristine on 9/20), RA, HLD, HTN, recent hospitalization for right sided hydronephrosis and is now s/p perc nephrostomy tube placement and exchange on 08/31/2017 after tube malfunction, presented with weakness, found to be hypotensive with presumptive sepsis. In ED, pt has received boluses of IV NS and was admitted for further evaluation. She has initially required vasopressor support and received significant IVF resuscitation. Pressors were weaned and diuresis started, slowly improving.    Assessment & Plan: Hypotension: suspected distributive shock, third-spacing IVF's administered earlier this hospitalization. Has been off pressors since 09/14/2017 - Improving.  - No evidence of sepsis at this time. Blood cultures NGTD s/p 5 days abx.  - Taper stress steroids to prednisone per oncology  Acute respiratory distress: Suspect acute diastolic CHF with pleural effusions and parainfluenza virus.  - Improved with thoracentesis 10/4, 580cc transudate. Repeat imaging showing very small effusions, no indication for repeat.  - Continue breathing treatments as needed  Right-sided hydronephrosis: Thought to be due to extrinsic compression due to malignancy, now s/p replacement of perc nephrostomy tube 9/14. Output is good. - Discussed with urology, Dr. Gloriann Loan. Nephrostomy tube without clinical evidence of malfunction, so no indication to exchange at this time. Can remain in up to 3 months, so will have follow up with urology, Dr. Karsten Ro, as outpatient.  - Cause of hydronephrosis is being addressed with chemotherapy as below.  Anasarca/acute on chronic diastolic CHF: Echocardiogram on 08/30/2017 shows grade 1 diastolic dysfunction.  EF 60-65%. - UOP slightly down in PM, and BP better, so will add PM dose > lasix 40mg  IV BID. Documented weight at admission not accurate per family, her dry weight should be closer to 180lbs and rose to 248lbs. Was up >13L on 9/28. Would not switch to po until anasarca improves dramatically as absorption would likely be impaired. - Continue strict I/O  Hypokalemia and hypomagnesemia: - Supplemented, improved.   DLBCL: Incidentally noted regression of lymphadenopathy on CT chest after 1st cycle of chemotherapy.  - Appreciate heme/onc assistance: chemo 10/10 & 10/11. S/P intrathecal MTX 10/9.   Severe protein calorie malnutrition in setting of morbid obesity: - Continue po supplementation. Hoping to improve lean body mass.   Anemia and pancytopenia: Suspect chemotx induced, improving s/p G-CSF. Hgb trending downward without active bleeding. Currently without symptoms. - 1u PRBCs 10/8, hgb stable since.  - Per onc, should receive irradiated blood products.  Constipation: - Cont bowel regimen   Rheumatoid arthritis. - On methotrexate in the past currently not on any medication.  DVT prophylaxis: SCD's Code Status: Full Family Communication: Sister at bedside Disposition Plan: Continue IV diuresis and DC to SNF once anasarca resolved, goal weight nearer to 180lbs.   Consultants:   Oncology  PCCM   Nephrology by phone 10/6.  Urology by phone 10/7  Procedures:   ECHO 9/13: - Left ventricle: The cavity size was normal. Wall thickness was   normal. Systolic function was normal. The estimated ejection   fraction was in the range of 60% to 65%. Wall motion was normal;   there were no regional wall motion abnormalities. Doppler   parameters are consistent with abnormal left ventricular   relaxation (grade 1 diastolic dysfunction). - Pulmonary arteries: Systolic  pressure was mildly increased. PA   peak pressure: 36 mm Hg (S).   Antimicrobials:   Vanc and Zosyn x5 days    Subjective: Using fewer breathing treatments, had less UOP later in the day yesterday.  Objective: Vitals:   09/26/17 1100 09/26/17 1142 09/26/17 1214 09/26/17 1245  BP: (!) 115/57 106/76 112/79 95/61  Pulse: 81 81 87 85  Resp: 17 16 20 20   Temp: 97.8 F (36.6 C) 98.6 F (37 C) 98.6 F (37 C) 98 F (36.7 C)  TempSrc: Oral Oral Oral Oral  SpO2: 97% 98% 99% 98%  Weight:      Height:        Intake/Output Summary (Last 24 hours) at 09/26/17 1259 Last data filed at 09/26/17 0619  Gross per 24 hour  Intake                5 ml  Output             1350 ml  Net            -1345 ml   Filed Weights   09/24/17 0609 09/25/17 0459 09/26/17 0620  Weight: 93.8 kg (206 lb 12.7 oz) 94.5 kg (208 lb 5.4 oz) 92.1 kg (203 lb 1.6 oz)   Constitutional: Calm, pleasant female in no distress CVS: RRR no murmur or JVD. 3+ Pitting LE edema symmetrically extending to abdomen. Pulmonary: Crackles at Left base, rhonchi improved. Nonlabored on 2L by [place].  Abdominal: Soft. BS +, no distension, tenderness, rebound or guarding.  Neuro: Alert, oriented, normal speech, no focal deficits. Skin: Nephrostomy site without erythema/induration. No venous stasis changes.  CBC:  Recent Labs Lab 09/21/17 0630 09/22/17 0334 09/24/17 0359 09/25/17 0523 09/26/17 0640  WBC 17.0* 14.9* 9.4 8.7 6.5  HGB 8.0* 7.4* 7.3* 8.8* 8.6*  HCT 24.2* 22.4* 22.5* 26.6* 26.4*  MCV 95.3 95.3 97.4 94.3 95.3  PLT 65* 68* 106* 106* 427*   Basic Metabolic Panel:  Recent Labs Lab 09/20/17 0409 09/21/17 0630 09/24/17 0359 09/25/17 0523  NA 139 138 138 141  K 3.6 4.0 4.1 4.0  CL 107 107 103 107  CO2 27 28 29 28   GLUCOSE 113* 97 82 85  BUN 32* 33* 36* 32*  CREATININE 0.57 0.48 0.54 0.55  CALCIUM 7.3* 7.2* 7.2* 7.3*  MG 2.0  --   --   --    Liver Function Tests:  Recent Labs Lab 09/25/17 0523  AST 24  ALT 27  ALKPHOS 121  BILITOT 0.8  PROT 4.2*  ALBUMIN 1.8*   Urine analysis:    Component Value Date/Time    COLORURINE YELLOW 09/11/2017 1650   APPEARANCEUR TURBID (A) 09/11/2017 1650   LABSPEC 1.018 09/11/2017 1650   PHURINE 5.0 09/11/2017 1650   GLUCOSEU NEGATIVE 09/11/2017 1650   HGBUR LARGE (A) 09/11/2017 1650   BILIRUBINUR NEGATIVE 09/11/2017 1650   KETONESUR NEGATIVE 09/11/2017 1650   PROTEINUR NEGATIVE 09/11/2017 1650   NITRITE NEGATIVE 09/11/2017 1650   LEUKOCYTESUR TRACE (A) 09/11/2017 1650   Recent Results (from the past 240 hour(s))  Respiratory Panel by PCR     Status: Abnormal   Collection Time: 09/18/17  9:54 AM  Result Value Ref Range Status   Adenovirus NOT DETECTED NOT DETECTED Final   Coronavirus 229E NOT DETECTED NOT DETECTED Final   Coronavirus HKU1 NOT DETECTED NOT DETECTED Final   Coronavirus NL63 NOT DETECTED NOT DETECTED Final   Coronavirus OC43 NOT DETECTED NOT DETECTED Final   Metapneumovirus NOT  DETECTED NOT DETECTED Final   Rhinovirus / Enterovirus NOT DETECTED NOT DETECTED Final   Influenza A NOT DETECTED NOT DETECTED Final   Influenza B NOT DETECTED NOT DETECTED Final   Parainfluenza Virus 1 NOT DETECTED NOT DETECTED Final   Parainfluenza Virus 2 DETECTED (A) NOT DETECTED Final   Parainfluenza Virus 3 NOT DETECTED NOT DETECTED Final   Parainfluenza Virus 4 NOT DETECTED NOT DETECTED Final   Respiratory Syncytial Virus NOT DETECTED NOT DETECTED Final   Bordetella pertussis NOT DETECTED NOT DETECTED Final   Chlamydophila pneumoniae NOT DETECTED NOT DETECTED Final   Mycoplasma pneumoniae NOT DETECTED NOT DETECTED Final    Comment: Performed at [facility], [place] [address] [place], [place] [address]  Culture, body fluid-bottle     Status: None   Collection Time: 09/20/17  3:54 PM  Result Value Ref Range Status   Specimen Description FLUID LEFT PLEURAL  Final   Special Requests NONE  Final   Culture   Final    NO GROWTH 5 DAYS Performed at [facility] [address] [place], [place] [address]    Report Status 09/25/2017 FINAL  Final   Gram stain     Status: None   Collection Time: 09/20/17  3:54 PM  Result Value Ref Range Status   Specimen Description FLUID LEFT PLEURAL  Final   Special Requests NONE  Final   Gram Stain   Final    FEW WBC PRESENT,BOTH PMN AND MONONUCLEAR NO ORGANISMS SEEN Performed at [facility], [address], [place],  [address]    Report Status 09/20/2017 FINAL  Final    Radiology Studies: Ct Chest Wo Contrast Result Date: 09/19/2017 1. Regression of previously noted lymphadenopathy indicative of a positive response to therapy.  2. New patchy areas of mid to upper lung predominant ground-glass attenuation and interlobular septal thickening. This is nonspecific, but given the normal cardiac size, this does not appear to reflect cardiogenic pulmonary edema. Noncardiogenic edema, alveolar hemorrhage and/or acute atypical infection are the primary differential considerations.  3. Increasing moderate bilateral pleural effusions, large volume of ascites, and diffuse body wall edema, suggestive of a state of underlying anasarca.  4. Aortic atherosclerosis, in addition to left anterior descending coronary artery disease. Assessment for potential risk factor modification, dietary therapy or pharmacologic therapy may be warranted, if clinically indicated.  5. Mild diffuse bronchial wall thickening with mild centrilobular and paraseptal emphysema; imaging findings suggestive of underlying COPD.    Dg Chest Port 1 View Result Date: 09/20/2017 Result Date: 09/20/2017 Successful ultrasound guided diagnostic and therapeutic left thoracentesis yielding 580 cc of pleural fluid.  Scheduled Meds: . albuterol  2.5 mg Nebulization TID  . benzonatate  100 mg Oral TID  . Chlorhexidine Gluconate Cloth  6 each Topical q morning - 10a  . furosemide  40 mg Intravenous Daily  . pantoprazole  40 mg Oral Daily  . potassium chloride  40 mEq Oral BID  . predniSONE  60 mg Oral Q breakfast  . protein supplement  shake  2 oz Oral QID   Continuous Infusions:    LOS: 15 days   Time spent: 25 minutes   Vance Gather, MD [facility] Pager [phone]  If 7PM-7AM, please contact night-coverage [email or web address] Password [email or web address] 09/26/2017, 12:59 PM

## 2017-09-26 NOTE — Progress Notes (Signed)
Physical Therapy Treatment Patient Details Name: Kristina Torres MRN: [id number] DOB: [birth year] Today's Date: 09/26/2017    History of Present Illness 71 y.o. Female with history of multiple medical problems including rheumatoid arthritis, hyperlipidemia, history of hypertension, and diffuse large B-cell lymphoma. Patient recently hospitalized with right-sided hydronephrosis status post percutaneous nephrostomy tube and exchange on 9/14 after tube malfunction. Patient started on chemotherapy with Cytoxan and vincristine on 9/20 and subsequently discharged to rehabilitation on 9/22. Patient's tube was fractured at her nursing facility and returned to hospital and was found to be hypotensive with borderline blood pressure for at least 2 weeks. PMH: HTN, HLD, gout, RA    PT Comments    Assisted pt to EOB + 2 assist.  Mild c/o dizziness.  Allowed sitting EOB x 10 min as symptoms decreased.  Using B platform EVA walker attempted standing off elevated bed + 2 assist.  Pt able to static stand with support of EVA walker but when attempted to "march in place" as a pre gait trial, B knees buckled.  Pt required + 3 total assist back to EOB.  Pt assisted back to bed supine and performed side rolling to address peri care/hygiene and linen change.  Recommend mechanical lift for OOB.    Follow Up Recommendations  SNF     Equipment Recommendations  None recommended by PT    Recommendations for Other Services       Precautions / Restrictions Precautions Precautions: Fall Precaution Comments: right side nephrostomy tube, Port, LEs with pitting edema Restrictions Weight Bearing Restrictions: No    Mobility  Bed Mobility Overal bed mobility: Needs Assistance Bed Mobility: Supine to Sit;Sit to Supine Rolling: Max assist   Supine to sit: Max assist;Total assist;+2 for physical assistance;+2 for safety/equipment Sit to supine: Max assist;+2 for physical assistance;Total assist   General bed  mobility comments: supine to sit + 2 assist pt 20%       sit to supine +2 assist pt 5% able  Transfers Overall transfer level: Needs assistance Equipment used: Bilateral platform walker Transfers: Sit to/from Stand Sit to Stand: Max assist        Lateral/Scoot Transfers: Max assist;+2 physical assistance General transfer comment: from elevated pt was able to briefly stand + 2 assist and use EVA walker.  However when attepmted to "march in place" pre gait B knnes buckled and pt assisted back to EOB + 3 total assist.    Ambulation/Gait             General Gait Details: unable due to B knees buckling during pre gait marching   Stairs            Wheelchair Mobility    Modified Rankin (Stroke Patients Only)       Balance                                            Cognition Arousal/Alertness: Awake/alert Behavior During Therapy: WFL for tasks assessed/performed Overall Cognitive Status: Within Functional Limits for tasks assessed                                 General Comments: mild anxiety with attempted standing      Exercises      General Comments        Pertinent Vitals/Pain Pain Assessment:  No/denies pain    Home Living                      Prior Function            PT Goals (current goals can now be found in the care plan section) Progress towards PT goals: Progressing toward goals    Frequency    Min 2X/week      PT Plan Current plan remains appropriate    Co-evaluation              AM-PAC PT "6 Clicks" Daily Activity  Outcome Measure  Difficulty turning over in bed (including adjusting bedclothes, sheets and blankets)?: Unable Difficulty moving from lying on back to sitting on the side of the bed? : Unable Difficulty sitting down on and standing up from a chair with arms (e.g., wheelchair, bedside commode, etc,.)?: Unable Help needed moving to and from a bed to chair (including a  wheelchair)?: Total Help needed walking in hospital room?: Total Help needed climbing 3-5 steps with a railing? : Total 6 Click Score: 6    End of Session Equipment Utilized During Treatment: Gait belt Activity Tolerance: Other (comment);Patient limited by fatigue;Treatment limited secondary to medical complications (Comment) (B LE edema) Patient left: in bed Nurse Communication: Mobility status;Need for lift equipment PT Visit Diagnosis: Muscle weakness (generalized) (M62.81);Other abnormalities of gait and mobility (R26.89)     Time: 3335-4562 PT Time Calculation (min) (ACUTE ONLY): 43 min  Charges:  $Therapeutic Activity: 38-52 mins                    G Codes:        Rica Koyanagi  PTA [facility]  Acute  Rehab Pager      [phone]

## 2017-09-26 NOTE — Progress Notes (Signed)
CSW following for assistance with disposition planning. Spoke with [facility]- facility pt interested in at this time. Will review pt's information and advise re: their ability to accept pt for ST rehab. Barriers- next round of chemotherapy in 21 days, planning to be seen at [facility] per pt.   Will follow.  Sharren Bridge, MSW, LCSW Clinical Social Work 09/26/2017 [phone]

## 2017-09-26 NOTE — Progress Notes (Signed)
Pt tolerated rituxan infusion well. VSS no distress noted. Will continue to monitor.

## 2017-09-27 DIAGNOSIS — N1330 Unspecified hydronephrosis: Secondary | ICD-10-CM

## 2017-09-27 LAB — CBC
HCT: 25.5 % — ABNORMAL LOW (ref 36.0–46.0)
Hemoglobin: 8.3 g/dL — ABNORMAL LOW (ref 12.0–15.0)
MCH: 31.3 pg (ref 26.0–34.0)
MCHC: 32.5 g/dL (ref 30.0–36.0)
MCV: 96.2 fL (ref 78.0–100.0)
PLATELETS: 127 10*3/uL — AB (ref 150–400)
RBC: 2.65 MIL/uL — ABNORMAL LOW (ref 3.87–5.11)
RDW: 21.9 % — AB (ref 11.5–15.5)
WBC: 6.6 10*3/uL (ref 4.0–10.5)

## 2017-09-27 LAB — BASIC METABOLIC PANEL
ANION GAP: 5 (ref 5–15)
BUN: 30 mg/dL — AB (ref 6–20)
CALCIUM: 7.4 mg/dL — AB (ref 8.9–10.3)
CO2: 30 mmol/L (ref 22–32)
CREATININE: 0.61 mg/dL (ref 0.44–1.00)
Chloride: 104 mmol/L (ref 101–111)
GFR calc Af Amer: >60 mL/min (ref >60)
GLUCOSE: 99 mg/dL (ref 65–99)
Potassium: 3.8 mmol/L (ref 3.5–5.1)
Sodium: 139 mmol/L (ref 135–145)

## 2017-09-27 LAB — MAGNESIUM: Magnesium: 1.6 mg/dL — ABNORMAL LOW (ref 1.7–2.4)

## 2017-09-27 MED ORDER — VINCRISTINE SULFATE CHEMO INJECTION 1 MG/ML
2.0000 mg | Freq: Once | INTRAVENOUS | Status: AC
  Administered 2017-09-27: 2 mg via INTRAVENOUS
  Filled 2017-09-27: qty 2

## 2017-09-27 MED ORDER — SODIUM CHLORIDE 0.9% FLUSH: 10.0000 mL | INTRAVENOUS | Status: DC | PRN

## 2017-09-27 MED ORDER — SODIUM CHLORIDE 0.9 % IV SOLN
10.0000 mg | Freq: Once | INTRAVENOUS | Status: AC
  Administered 2017-09-27: 10 mg via INTRAVENOUS
  Filled 2017-09-27: qty 1

## 2017-09-27 MED ORDER — DOXORUBICIN HCL CHEMO IV INJECTION 2 MG/ML
25.0000 mg/m2 | Freq: Once | INTRAVENOUS | Status: AC
  Administered 2017-09-27: 50 mg via INTRAVENOUS
  Filled 2017-09-27: qty 25

## 2017-09-27 MED ORDER — SODIUM CHLORIDE 0.9 % IV SOLN: Freq: Once | INTRAVENOUS | Status: DC

## 2017-09-27 MED ORDER — ALTEPLASE 2 MG IJ SOLR: 2.0000 mg | Freq: Once | INTRAMUSCULAR | Status: DC | PRN

## 2017-09-27 MED ORDER — HEPARIN SOD (PORK) LOCK FLUSH 100 UNIT/ML IV SOLN: 250.0000 [IU] | Freq: Once | INTRAVENOUS | Status: DC | PRN

## 2017-09-27 MED ORDER — PALONOSETRON HCL INJECTION 0.25 MG/5ML
0.2500 mg | Freq: Once | INTRAVENOUS | Status: AC
  Administered 2017-09-27: 0.25 mg via INTRAVENOUS
  Filled 2017-09-27: qty 5

## 2017-09-27 MED ORDER — SODIUM CHLORIDE 0.9% FLUSH: 3.0000 mL | INTRAVENOUS | Status: DC | PRN

## 2017-09-27 MED ORDER — SODIUM CHLORIDE 0.9 % IV SOLN
600.0000 mg/m2 | Freq: Once | INTRAVENOUS | Status: AC
  Administered 2017-09-27: 1180 mg via INTRAVENOUS
  Filled 2017-09-27: qty 59

## 2017-09-27 MED ORDER — HEPARIN SOD (PORK) LOCK FLUSH 100 UNIT/ML IV SOLN: 500.0000 [IU] | Freq: Once | INTRAVENOUS | Status: DC | PRN

## 2017-09-27 MED ORDER — MAGNESIUM SULFATE 2 GM/50ML IV SOLN
2.0000 g | Freq: Once | INTRAVENOUS | Status: AC
  Administered 2017-09-27: 2 g via INTRAVENOUS
  Filled 2017-09-27: qty 50

## 2017-09-27 NOTE — Progress Notes (Signed)
Patient ID: Kristina Torres, female   DOB: [birth year], 71 y.o.   MRN: [id number]    PROGRESS NOTE  Kristina Torres  VVO:[id number] DOB: Jun 10, 1946 DOA: 09/10/2017  PCP: [name], MD   Brief Narrative:  Pt is 71 yo female with known DLBCL (started on chemo Cytoxan and Vincristine on 9/20), RA, HLD, HTN, recent hospitalization for right sided hydronephrosis and is now s/p perc nephrostomy tube placement and exchange on 08/31/2017 after tube malfunction, presented with weakness, found to be hypotensive with presumptive sepsis. In ED, pt has received boluses of IV NS and was admitted for further evaluation. She has initially required vasopressor support and received significant IVF resuscitation. Pressors were weaned and diuresis started, slowly improving.    Assessment & Plan: Anasarca/acute on chronic diastolic CHF: Echocardiogram on 08/30/2017 shows grade 1 diastolic dysfunction. EF 60-65%. - UOP robust with lasix 40mg  IV BID, will continue this as creatinine and BP stable. Still with LE swelling. Dry weight should be close to 180lbs per family and rose to 248lbs. Was up >13L on 9/28. - Continue strict I/O and daily weights.  Hypotension: suspected distributive shock, third-spacing IVF's administered earlier this hospitalization. Has been off pressors since 09/14/2017 - Improved.  - No evidence of sepsis at this time. Blood cultures NGTD s/p 5 days abx.  - Tapered stress steroids to prednisone per oncology  Acute respiratory distress: Suspect acute diastolic CHF with pleural effusions and parainfluenza virus.  - Improved with thoracentesis 10/4, 580cc transudate. Repeat imaging showing very small effusions, no indication for repeat.  - Wean O2 as able, anticipate no oxygen requirement by time of DC.   Right-sided hydronephrosis: Thought to be due to extrinsic compression due to malignancy, now s/p replacement of perc nephrostomy tube 9/14. Output is good. - Discussed with urology, Dr.  Gloriann Loan. Nephrostomy tube without clinical evidence of malfunction, so no indication to exchange at this time. Can remain in up to 3 months, so will have follow up with urology, Dr. Karsten Ro, as outpatient.  - Cause of hydronephrosis is being addressed with chemotherapy as below.  Hypokalemia and hypomagnesemia: - Supplemented, improved.   DLBCL: Incidentally noted regression of lymphadenopathy on CT chest after 1st cycle of chemotherapy.  - Appreciate heme/onc assistance: chemo 10/10 & 10/11. S/P intrathecal MTX 10/9.   Severe protein calorie malnutrition in setting of morbid obesity: - Continue po supplementation. Hoping to improve lean body mass.   Anemia and pancytopenia: Suspect chemotx induced, improving s/p G-CSF. Hgb trending downward without active bleeding. Currently without symptoms. - 1u PRBCs 10/8, hgb stable since.  - Per onc, should receive irradiated blood products.  Constipation: - Cont bowel regimen   Rheumatoid arthritis. - On methotrexate in the past currently not on any medication.  DVT prophylaxis: SCD's Code Status: Full Family Communication: Sisters and husband at bedside Disposition Plan: Continue IV diuresis for now and DC to SNF once anasarca resolved, goal weight nearer to 180lbs.   Consultants:   Oncology  PCCM   Nephrology by phone 10/6.  Urology by phone 10/7  Procedures:   ECHO 9/13: - Left ventricle: The cavity size was normal. Wall thickness was   normal. Systolic function was normal. The estimated ejection   fraction was in the range of 60% to 65%. Wall motion was normal;   there were no regional wall motion abnormalities. Doppler   parameters are consistent with abnormal left ventricular   relaxation (grade 1 diastolic dysfunction). - Pulmonary arteries: Systolic pressure was mildly increased. PA  peak pressure: 36 mm Hg (S).   Antimicrobials:   Vanc and Zosyn x5 days   Subjective: No longer requiring breathing treatments,  feels swelling is improving. ~4L out/24hrs without hypotension.   Objective: Vitals:   09/26/17 1500 09/26/17 2238 09/27/17 0649 09/27/17 1247  BP: 105/70 115/61 103/66 103/63  Pulse: 75 84 81 88  Resp: 17 16 16 16   Temp:  98.2 F (36.8 C) 97.6 F (36.4 C) 98.1 F (36.7 C)  TempSrc:  Oral Oral Oral  SpO2: 98% 97% 97% 100%  Weight:      Height:        Intake/Output Summary (Last 24 hours) at 09/27/17 1526 Last data filed at 09/27/17 1105  Gross per 24 hour  Intake                0 ml  Output             4050 ml  Net            -4050 ml   Filed Weights   09/24/17 0609 09/25/17 0459 09/26/17 0620  Weight: 93.8 kg (206 lb 12.7 oz) 94.5 kg (208 lb 5.4 oz) 92.1 kg (203 lb 1.6 oz)   Constitutional: Calm, pleasant female in no distress CVS: RRR no murmur or JVD. 2+ pitting LE edema symmetrically extending to thighs. Pulmonary: Still has crackles at L>R base. Nonlabored on 1L by [place]  Abdominal: Soft. BS +, no distension, tenderness, rebound or guarding.  Neuro: Alert, oriented, normal speech, no focal deficits. Skin: Nephrostomy site without erythema/induration. No venous stasis changes.  CBC:  Recent Labs Lab 09/22/17 0334 09/24/17 0359 09/25/17 0523 09/26/17 0640 09/27/17 0500  WBC 14.9* 9.4 8.7 6.5 6.6  HGB 7.4* 7.3* 8.8* 8.6* 8.3*  HCT 22.4* 22.5* 26.6* 26.4* 25.5*  MCV 95.3 97.4 94.3 95.3 96.2  PLT 68* 106* 106* 126* 703*   Basic Metabolic Panel:  Recent Labs Lab 09/21/17 0630 09/24/17 0359 09/25/17 0523 09/27/17 0500  NA 138 138 141 139  K 4.0 4.1 4.0 3.8  CL 107 103 107 104  CO2 28 29 28 30   GLUCOSE 97 82 85 99  BUN 33* 36* 32* 30*  CREATININE 0.48 0.54 0.55 0.61  CALCIUM 7.2* 7.2* 7.3* 7.4*  MG  --   --   --  1.6*   Liver Function Tests:  Recent Labs Lab 09/25/17 0523  AST 24  ALT 27  ALKPHOS 121  BILITOT 0.8  PROT 4.2*  ALBUMIN 1.8*   Urine analysis:    Component Value Date/Time   COLORURINE YELLOW 09/11/2017 1650   APPEARANCEUR  TURBID (A) 09/11/2017 1650   LABSPEC 1.018 09/11/2017 1650   PHURINE 5.0 09/11/2017 1650   GLUCOSEU NEGATIVE 09/11/2017 1650   HGBUR LARGE (A) 09/11/2017 1650   BILIRUBINUR NEGATIVE 09/11/2017 1650   KETONESUR NEGATIVE 09/11/2017 1650   PROTEINUR NEGATIVE 09/11/2017 1650   NITRITE NEGATIVE 09/11/2017 1650   LEUKOCYTESUR TRACE (A) 09/11/2017 1650   Recent Results (from the past 240 hour(s))  Respiratory Panel by PCR     Status: Abnormal   Collection Time: 09/18/17  9:54 AM  Result Value Ref Range Status   Adenovirus NOT DETECTED NOT DETECTED Final   Coronavirus 229E NOT DETECTED NOT DETECTED Final   Coronavirus HKU1 NOT DETECTED NOT DETECTED Final   Coronavirus NL63 NOT DETECTED NOT DETECTED Final   Coronavirus OC43 NOT DETECTED NOT DETECTED Final   Metapneumovirus NOT DETECTED NOT DETECTED Final   Rhinovirus / Enterovirus  NOT DETECTED NOT DETECTED Final   Influenza A NOT DETECTED NOT DETECTED Final   Influenza B NOT DETECTED NOT DETECTED Final   Parainfluenza Virus 1 NOT DETECTED NOT DETECTED Final   Parainfluenza Virus 2 DETECTED (A) NOT DETECTED Final   Parainfluenza Virus 3 NOT DETECTED NOT DETECTED Final   Parainfluenza Virus 4 NOT DETECTED NOT DETECTED Final   Respiratory Syncytial Virus NOT DETECTED NOT DETECTED Final   Bordetella pertussis NOT DETECTED NOT DETECTED Final   Chlamydophila pneumoniae NOT DETECTED NOT DETECTED Final   Mycoplasma pneumoniae NOT DETECTED NOT DETECTED Final    Comment: Performed at [facility], [place] [address], [place], [place] [address]  Culture, body fluid-bottle     Status: None   Collection Time: 09/20/17  3:54 PM  Result Value Ref Range Status   Specimen Description FLUID LEFT PLEURAL  Final   Special Requests NONE  Final   Culture   Final    NO GROWTH 5 DAYS Performed at [facility] [address] [place], [place] [address]    Report Status 09/25/2017 FINAL  Final  Gram stain     Status: None   Collection Time:  09/20/17  3:54 PM  Result Value Ref Range Status   Specimen Description FLUID LEFT PLEURAL  Final   Special Requests NONE  Final   Gram Stain   Final    FEW WBC PRESENT,BOTH PMN AND MONONUCLEAR NO ORGANISMS SEEN Performed at [facility], [address], [place], [place] [address]    Report Status 09/20/2017 FINAL  Final    Radiology Studies: Ct Chest Wo Contrast Result Date: 09/19/2017 1. Regression of previously noted lymphadenopathy indicative of a positive response to therapy.  2. New patchy areas of mid to upper lung predominant ground-glass attenuation and interlobular septal thickening. This is nonspecific, but given the normal cardiac size, this does not appear to reflect cardiogenic pulmonary edema. Noncardiogenic edema, alveolar hemorrhage and/or acute atypical infection are the primary differential considerations.  3. Increasing moderate bilateral pleural effusions, large volume of ascites, and diffuse body wall edema, suggestive of a state of underlying anasarca.  4. Aortic atherosclerosis, in addition to left anterior descending coronary artery disease. Assessment for potential risk factor modification, dietary therapy or pharmacologic therapy may be warranted, if clinically indicated.  5. Mild diffuse bronchial wall thickening with mild centrilobular and paraseptal emphysema; imaging findings suggestive of underlying COPD.    Dg Chest Port 1 View Result Date: 09/20/2017 Result Date: 09/20/2017 Successful ultrasound guided diagnostic and therapeutic left thoracentesis yielding 580 cc of pleural fluid.  Scheduled Meds: . benzonatate  100 mg Oral TID  . Chlorhexidine Gluconate Cloth  6 each Topical q morning - 10a  . furosemide  40 mg Intravenous BID  . pantoprazole  40 mg Oral Daily  . potassium chloride  40 mEq Oral BID  . predniSONE  60 mg Oral Q breakfast  . protein supplement shake  2 oz Oral QID   Continuous Infusions: . sodium chloride       LOS: 16 days    Time spent: 25 minutes   Vance Gather, MD [facility] Pager [phone]  If 7PM-7AM, please contact night-coverage [email or web address] Password [email or web address] 09/27/2017, 3:26 PM

## 2017-09-27 NOTE — Progress Notes (Addendum)
Kristina Torres   DOB:06/27/1946   EV#:[id number]   WEX#:[id number]  Heme/onc follow-up  Subjective: Kristina Torres tolerated Rituxan very well without any reactions yesterday, she also developed a very well with chemotherapy today. She was sitting in the bed and having dinner when I saw her. She feels well overall.  Objective:  Vitals:   09/27/17 1247 09/27/17 1808  BP: 103/63 94/63  Pulse: 88 79  Resp: 16 17  Temp: 98.1 F (36.7 C) 98 F (36.7 C)  SpO2: 100% 99%    Body mass index is 41.24 kg/m.  Intake/Output Summary (Last 24 hours) at 09/27/17 1920 Last data filed at 09/27/17 1105  Gross per 24 hour  Intake                0 ml  Output             4050 ml  Net            -4050 ml     Sclerae unicteric  Oropharynx clear  No peripheral adenopathy  Right lung clear, decreased breath sounds in left lung mild wheezing  Heart regular rate and rhythm  Abdomen benign  MSK diffuse anasarca  Neuro nonfocal, awake and alert  CBG (last 3)  No results for input(s): GLUCAP in the last 72 hours.   Labs:  Lab Results  Component Value Date   WBC 6.6 09/27/2017   HGB 8.3 (L) 09/27/2017   HCT 25.5 (L) 09/27/2017   MCV 96.2 09/27/2017   PLT 127 (L) 09/27/2017   NEUTROABS 9.4 (H) 09/17/2017    Urine Studies No results for input(s): UHGB, CRYS in the last 72 hours.  Invalid input(s): UACOL, UAPR, USPG, UPH, UTP, UGL, UKET, UBIL, UNIT, UROB, ULEU, UEPI, UWBC, URBC, [place], CAST, [place], [place]  Basic Metabolic Panel:  Recent Labs Lab 09/21/17 0630 09/24/17 0359 09/25/17 0523 09/27/17 0500  NA 138 138 141 139  K 4.0 4.1 4.0 3.8  CL 107 103 107 104  CO2 28 29 28 30   GLUCOSE 97 82 85 99  BUN 33* 36* 32* 30*  CREATININE 0.48 0.54 0.55 0.61  CALCIUM 7.2* 7.2* 7.3* 7.4*  MG  --   --   --  1.6*   GFR Estimated Creatinine Clearance: 64.3 mL/min (by C-G formula based on SCr of 0.61 mg/dL). Liver Function Tests:  Recent Labs Lab 09/25/17 0523  AST 24  ALT 27  ALKPHOS 121   BILITOT 0.8  PROT 4.2*  ALBUMIN 1.8*   No results for input(s): LIPASE, AMYLASE in the last 168 hours. No results for input(s): AMMONIA in the last 168 hours. Coagulation profile No results for input(s): INR, PROTIME in the last 168 hours.  CBC:  Recent Labs Lab 09/22/17 0334 09/24/17 0359 09/25/17 0523 09/26/17 0640 09/27/17 0500  WBC 14.9* 9.4 8.7 6.5 6.6  HGB 7.4* 7.3* 8.8* 8.6* 8.3*  HCT 22.4* 22.5* 26.6* 26.4* 25.5*  MCV 95.3 97.4 94.3 95.3 96.2  PLT 68* 106* 106* 126* 127*   Cardiac Enzymes: No results for input(s): CKTOTAL, CKMB, CKMBINDEX, TROPONINI in the last 168 hours. BNP: Invalid input(s): POCBNP CBG: No results for input(s): GLUCAP in the last 168 hours. D-Dimer No results for input(s): DDIMER in the last 72 hours. Hgb A1c No results for input(s): HGBA1C in the last 72 hours. Lipid Profile No results for input(s): CHOL, HDL, LDLCALC, TRIG, CHOLHDL, LDLDIRECT in the last 72 hours. Thyroid function studies No results for input(s): TSH, T4TOTAL, T3FREE, THYROIDAB in the last  72 hours.  Invalid input(s): FREET3 Anemia work up No results for input(s): VITAMINB12, FOLATE, FERRITIN, TIBC, IRON, RETICCTPCT in the last 72 hours. Microbiology Recent Results (from the past 240 hour(s))  Respiratory Panel by PCR     Status: Abnormal   Collection Time: 09/18/17  9:54 AM  Result Value Ref Range Status   Adenovirus NOT DETECTED NOT DETECTED Final   Coronavirus 229E NOT DETECTED NOT DETECTED Final   Coronavirus HKU1 NOT DETECTED NOT DETECTED Final   Coronavirus NL63 NOT DETECTED NOT DETECTED Final   Coronavirus OC43 NOT DETECTED NOT DETECTED Final   Metapneumovirus NOT DETECTED NOT DETECTED Final   Rhinovirus / Enterovirus NOT DETECTED NOT DETECTED Final   Influenza A NOT DETECTED NOT DETECTED Final   Influenza B NOT DETECTED NOT DETECTED Final   Parainfluenza Virus 1 NOT DETECTED NOT DETECTED Final   Parainfluenza Virus 2 DETECTED (A) NOT DETECTED Final    Parainfluenza Virus 3 NOT DETECTED NOT DETECTED Final   Parainfluenza Virus 4 NOT DETECTED NOT DETECTED Final   Respiratory Syncytial Virus NOT DETECTED NOT DETECTED Final   Bordetella pertussis NOT DETECTED NOT DETECTED Final   Chlamydophila pneumoniae NOT DETECTED NOT DETECTED Final   Mycoplasma pneumoniae NOT DETECTED NOT DETECTED Final    Comment: Performed at [facility], [place], [place], [place] [address]  Culture, body fluid-bottle     Status: None   Collection Time: 09/20/17  3:54 PM  Result Value Ref Range Status   Specimen Description FLUID LEFT PLEURAL  Final   Special Requests NONE  Final   Culture   Final    NO GROWTH 5 DAYS Performed at [facility] [address], [place], [place] [address]    Report Status 09/25/2017 FINAL  Final  Gram stain     Status: None   Collection Time: 09/20/17  3:54 PM  Result Value Ref Range Status   Specimen Description FLUID LEFT PLEURAL  Final   Special Requests NONE  Final   Gram Stain   Final    FEW WBC PRESENT,BOTH PMN AND MONONUCLEAR NO ORGANISMS SEEN Performed at [facility], [address] [place], [place] [address]    Report Status 09/20/2017 FINAL  Final    Studies:  No results found.  Assessment: 71 y.o. female with a history of hypertension, gout, hyperlipidemia and rheumatoid arthritis on methotrexate, presented with severe right hydronephrosis, fatigue, nausea and weakness.   1. Diffuse large B cell lymphoma, with bone marrow involvement, stage IV, IPS 4, high risk, s/p first cycle chemo R-CVP on 09/06/2017, second cycle R-CHOP with dose reduction on 09/26/17 2. Pancytopenia, secondary to #2 and chemo  3. Severe anasarca and hypoalbuminemia  4. RA, MTX on hold for now  5. HTN 6. Right hydronephrosis secondary to ureteral obstruction, status post nephrostomy tube placement 7. History of severe hypercalcemia, resolved  8. Nausea and vomiting, anorexia  9. Severe calorie and protein  malnutrition  10 dyspnea, bilateral pleural effusions, improved after thoracentesis on 10/4 and diuretics    Plan:  -Pt had first IT MTX on 10/9, CSF cytology negative  -She has completed second cycle Rituxan and CHOP with dose reduction very well -Continue prednisone 60 mg daily for 5 days, she has been on steroids since this hospital admission, would need tapering dose after she completes 5 days of prednisone. -Hg 8.3 today, her anemia will likely get worse after chemotherapy, I recommend 2 units of blood transfusion in the  next few days  -I'll arrange for outpatient Neulasta injection, he can be given anytime in the next 1 week -I'll arrange her follow-up with my partner Dr. Hinton Rao at [facility] in the next 1-2 weeks -I'll be out office from tomorrow, return on 10/09/2017. My nurse practitioner Cira Rue will be following her until her discharge.  -she is going to rehab in 3-4 days, per pt.  -I encourage her to do PT/OT    Truitt Merle, MD 09/27/2017

## 2017-09-27 NOTE — Progress Notes (Cosign Needed)
Chemotherapy dosage and calculations checked and reviewed with Jean Wolf RN. 

## 2017-09-28 LAB — BASIC METABOLIC PANEL
Anion gap: 9 (ref 5–15)
BUN: 39 mg/dL — AB (ref 6–20)
CHLORIDE: 102 mmol/L (ref 101–111)
CO2: 28 mmol/L (ref 22–32)
Calcium: 7.3 mg/dL — ABNORMAL LOW (ref 8.9–10.3)
Creatinine, Ser: 0.69 mg/dL (ref 0.44–1.00)
Glucose, Bld: 116 mg/dL — ABNORMAL HIGH (ref 65–99)
POTASSIUM: 4 mmol/L (ref 3.5–5.1)
SODIUM: 139 mmol/L (ref 135–145)

## 2017-09-28 LAB — CBC
HCT: 25.8 % — ABNORMAL LOW (ref 36.0–46.0)
HEMOGLOBIN: 8.6 g/dL — AB (ref 12.0–15.0)
MCH: 31.4 pg (ref 26.0–34.0)
MCHC: 33.3 g/dL (ref 30.0–36.0)
MCV: 94.2 fL (ref 78.0–100.0)
PLATELETS: 112 10*3/uL — AB (ref 150–400)
RBC: 2.74 MIL/uL — AB (ref 3.87–5.11)
RDW: 21.1 % — ABNORMAL HIGH (ref 11.5–15.5)
WBC: 6.9 10*3/uL (ref 4.0–10.5)

## 2017-09-28 LAB — MAGNESIUM: MAGNESIUM: 2 mg/dL (ref 1.7–2.4)

## 2017-09-28 NOTE — Progress Notes (Signed)
Patient ID: Kristina Torres, female   DOB: May 27, 1946, 71 y.o.   MRN: [id number]    PROGRESS NOTE  Kristina Torres  [id number]:[id number] DOB: 08/02/1946 DOA: 09/10/2017  PCP: [name], MD   Brief Narrative:  Pt is 71 yo female with known DLBCL (started on chemo Cytoxan and Vincristine on 9/20), RA, HLD, HTN, recent hospitalization for right sided hydronephrosis and is now s/p perc nephrostomy tube placement and exchange on 08/31/2017 after tube malfunction, presented with weakness, found to be hypotensive with presumptive sepsis. In ED, pt has received boluses of IV NS and was admitted for further evaluation. She has initially required vasopressor support and received significant IVF resuscitation. Pressors were weaned and diuresis started, slowly improving.    Assessment & Plan: Anasarca/acute on chronic diastolic CHF: Echocardiogram on 08/30/2017 shows grade 1 diastolic dysfunction. EF 60-65%. - UOP robust with lasix 40mg  IV BID, will continue this as creatinine and BP stable. Still with LE swelling. Dry weight should be close to 180lbs per family and rose to 248lbs. Was up >13L on 9/28. - weight trend below [facility] Weights   09/26/17 0620 09/27/17 1808 09/28/17 0440  Weight: 92.1 kg (203 lb 1.6 oz) 89.5 kg (197 lb 4.8 oz) 88 kg (194 lb)  - monitor daily weights, strict I/O  Hypotension: suspected distributive shock, third-spacing IVF's administered earlier this hospitalization. Has been off pressors since 09/14/2017 - No evidence of sepsis at this time. Blood cultures NGTD s/p 5 days abx.  - Tapered stress steroids to prednisone per oncology - BP overall stable   Acute respiratory distress: Suspect acute diastolic CHF with pleural effusions and parainfluenza virus.  - Improved with thoracentesis 10/4, 580cc transudate. Repeat imaging showing very small effusions, no indication for repeat.  - cont to attempt weaning off oxygen   Right-sided hydronephrosis: Thought to be due to extrinsic  compression due to malignancy, now s/p replacement of perc nephrostomy tube 9/14. Output is good. - Discussed with urology, Dr. Gloriann Loan. Nephrostomy tube without clinical evidence of malfunction, so no indication to exchange at this time. Can remain in up to 3 months, so will have follow up with urology, Dr. Karsten Ro, as outpatient.  - Cause of hydronephrosis is being addressed   Hypokalemia and hypomagnesemia: - K is WNL, Mag pending   DLBCL: Incidentally noted regression of lymphadenopathy on CT chest after 1st cycle of chemotherapy.  - Appreciate heme/onc assistance: chemo 10/10 & 10/11. S/P intrathecal MTX 10/9.   Severe protein calorie malnutrition in setting of morbid obesity: - Continue po supplementation. Hoping to improve lean body mass.   Anemia and pancytopenia: Suspect chemotx induced, improving s/p G-CSF. Hgb trending downward without active bleeding. Currently without symptoms. - 1u PRBCs 10/8, hgb stable since.  - Per onc, should receive irradiated blood products.  Constipation: - Cont bowel regimen   Rheumatoid arthritis. - On methotrexate in the past currently not on any medication.  DVT prophylaxis: SCD's Code Status: Full Family Communication: family at bedside  Disposition Plan: d/c in several days when anasarca improves and blood counts stable   Consultants:   Oncology  PCCM   Nephrology by phone 10/6.  Urology by phone 10/7  Procedures:   ECHO 9/13: - Left ventricle: The cavity size was normal. Wall thickness was   normal. Systolic function was normal. The estimated ejection   fraction was in the range of 60% to 65%. Wall motion was normal;   there were no regional wall motion abnormalities. Doppler   parameters are consistent  with abnormal left ventricular   relaxation (grade 1 diastolic dysfunction). - Pulmonary arteries: Systolic pressure was mildly increased. PA   peak pressure: 36 mm Hg (S).   Antimicrobials:   Vanc and Zosyn x5 days    Subjective: Reports feeling better.   Objective: Vitals:   09/27/17 1247 09/27/17 1808 09/27/17 2034 09/28/17 0440  BP: 103/63 94/63 (!) 102/57 110/71  Pulse: 88 79 78 65  Resp: 16 17 18 18   Temp: 98.1 F (36.7 C) 98 F (36.7 C) 98.3 F (36.8 C) 98 F (36.7 C)  TempSrc: Oral Oral Oral Oral  SpO2: 100% 99% 98% 97%  Weight:  89.5 kg (197 lb 4.8 oz)  88 kg (194 lb)  Height:        Intake/Output Summary (Last 24 hours) at 09/28/17 1319 Last data filed at 09/28/17 1134  Gross per 24 hour  Intake              725 ml  Output             6350 ml  Net            -5625 ml   Filed Weights   09/26/17 0620 09/27/17 1808 09/28/17 0440  Weight: 92.1 kg (203 lb 1.6 oz) 89.5 kg (197 lb 4.8 oz) 88 kg (194 lb)   Physical Exam  Constitutional: Appears calm, NAD CVS: RRR, S1/S2 +, no murmurs, no gallops, no carotid bruit.  Pulmonary: Effort and breath sounds normal, crackles at bases  Abdominal: Soft. BS +,  no distension, tenderness, rebound or guarding.  Musculoskeletal: Normal range of motion. +2 bilateral LE edema  Psychiatric: Normal mood and affect. Behavior, judgment, thought content normal.   CBC:  Recent Labs Lab 09/24/17 0359 09/25/17 0523 09/26/17 0640 09/27/17 0500 09/28/17 0529  WBC 9.4 8.7 6.5 6.6 6.9  HGB 7.3* 8.8* 8.6* 8.3* 8.6*  HCT 22.5* 26.6* 26.4* 25.5* 25.8*  MCV 97.4 94.3 95.3 96.2 94.2  PLT 106* 106* 126* 127* 628*   Basic Metabolic Panel:  Recent Labs Lab 09/24/17 0359 09/25/17 0523 09/27/17 0500 09/28/17 0529  NA 138 141 139 139  K 4.1 4.0 3.8 4.0  CL 103 107 104 102  CO2 29 28 30 28   GLUCOSE 82 85 99 116*  BUN 36* 32* 30* 39*  CREATININE 0.54 0.55 0.61 0.69  CALCIUM 7.2* 7.3* 7.4* 7.3*  MG  --   --  1.6*  --    Liver Function Tests:  Recent Labs Lab 09/25/17 0523  AST 24  ALT 27  ALKPHOS 121  BILITOT 0.8  PROT 4.2*  ALBUMIN 1.8*   Urine analysis:    Component Value Date/Time   COLORURINE YELLOW 09/11/2017 1650    APPEARANCEUR TURBID (A) 09/11/2017 1650   LABSPEC 1.018 09/11/2017 1650   PHURINE 5.0 09/11/2017 1650   GLUCOSEU NEGATIVE 09/11/2017 1650   HGBUR LARGE (A) 09/11/2017 1650   BILIRUBINUR NEGATIVE 09/11/2017 1650   KETONESUR NEGATIVE 09/11/2017 1650   PROTEINUR NEGATIVE 09/11/2017 1650   NITRITE NEGATIVE 09/11/2017 1650   LEUKOCYTESUR TRACE (A) 09/11/2017 1650   Recent Results (from the past 240 hour(s))  Culture, body fluid-bottle     Status: None   Collection Time: 09/20/17  3:54 PM  Result Value Ref Range Status   Specimen Description FLUID LEFT PLEURAL  Final   Special Requests NONE  Final   Culture   Final    NO GROWTH 5 DAYS Performed at [facility], [place]  [address], [place], [place] [address]    Report Status 09/25/2017 FINAL  Final  Gram stain     Status: None   Collection Time: 09/20/17  3:54 PM  Result Value Ref Range Status   Specimen Description FLUID LEFT PLEURAL  Final   Special Requests NONE  Final   Gram Stain   Final    FEW WBC PRESENT,BOTH PMN AND MONONUCLEAR NO ORGANISMS SEEN Performed at [facility], [address] [place], [place] [address]    Report Status 09/20/2017 FINAL  Final    Radiology Studies: Ct Chest Wo Contrast Result Date: 09/19/2017 1. Regression of previously noted lymphadenopathy indicative of a positive response to therapy.  2. New patchy areas of mid to upper lung predominant ground-glass attenuation and interlobular septal thickening. This is nonspecific, but given the normal cardiac size, this does not appear to reflect cardiogenic pulmonary edema. Noncardiogenic edema, alveolar hemorrhage and/or acute atypical infection are the primary differential considerations.  3. Increasing moderate bilateral pleural effusions, large volume of ascites, and diffuse body wall edema, suggestive of a state of underlying anasarca.  4. Aortic atherosclerosis, in addition to left anterior descending coronary artery disease. Assessment for  potential risk factor modification, dietary therapy or pharmacologic therapy may be warranted, if clinically indicated.  5. Mild diffuse bronchial wall thickening with mild centrilobular and paraseptal emphysema; imaging findings suggestive of underlying COPD.    Dg Chest Port 1 View Result Date: 09/20/2017 Result Date: 09/20/2017 Successful ultrasound guided diagnostic and therapeutic left thoracentesis yielding 580 cc of pleural fluid.  Scheduled Meds: . benzonatate  100 mg Oral TID  . Chlorhexidine Gluconate Cloth  6 each Topical q morning - 10a  . furosemide  40 mg Intravenous BID  . pantoprazole  40 mg Oral Daily  . potassium chloride  40 mEq Oral BID  . predniSONE  60 mg Oral Q breakfast  . protein supplement shake  2 oz Oral QID   Continuous Infusions: . sodium chloride       LOS: 17 days   Time spent: 25 minutes   Faye Ramsay, MD [facility] Pager [phone]  If 7PM-7AM, please contact night-coverage [email or web address] Password [email or web address] 09/28/2017, 1:19 PM

## 2017-09-28 NOTE — Progress Notes (Signed)
Physical Therapy Treatment Patient Details Name: Kristina Torres MRN: [id number] DOB: 03/15/1946 Today's Date: 09/28/2017    History of Present Illness 71 y.o. Female with history of multiple medical problems including rheumatoid arthritis, hyperlipidemia, history of hypertension, and diffuse large B-cell lymphoma. Patient recently hospitalized with right-sided hydronephrosis status post percutaneous nephrostomy tube and exchange on 9/14 after tube malfunction. Patient started on chemotherapy with Cytoxan and vincristine on 9/20 and subsequently discharged to rehabilitation on 9/22. Patient's tube was fractured at her nursing facility and returned to hospital and was found to be hypotensive with borderline blood pressure for at least 2 weeks. PMH: HTN, HLD, gout, RA    PT Comments    Pt was OOB earlier in recliner for several hours via lift and currently back in bed.  So performed bed mobility activities and B LE TE's and trunk activities while sitting EOB (tolerated 12 min)   Required + 2 assist to transition to EOB but once upright pt able to sit at Supervision level.    Follow Up Recommendations  SNF     Equipment Recommendations  None recommended by PT    Recommendations for Other Services       Precautions / Restrictions Precautions Precautions: Fall Precaution Comments: right side nephrostomy tube, Port, LEs with pitting edema Restrictions Weight Bearing Restrictions: No    Mobility  Bed Mobility Overal bed mobility: Needs Assistance Bed Mobility: Supine to Sit;Sit to Supine     Supine to sit: Max assist;+2 for physical assistance;+2 for safety/equipment Sit to supine: Max assist;+2 for physical assistance;Total assist   General bed mobility comments: supine to sit + 2 assist pt 25%       sit to supine +2 assist pt 10% able  Transfers                 General transfer comment: pt was OOB earlier with nursing staff  Ambulation/Gait                  Stairs            Wheelchair Mobility    Modified Rankin (Stroke Patients Only)       Balance                                            Cognition Arousal/Alertness: Awake/alert Behavior During Therapy: WFL for tasks assessed/performed Overall Cognitive Status: Within Functional Limits for tasks assessed                                 General Comments: highly motivated      Exercises      General Comments        Pertinent Vitals/Pain Pain Assessment: Faces Faces Pain Scale: Hurts a little bit Pain Location: L flank near Nephrostomy Pain Descriptors / Indicators: Burning Pain Intervention(s): Monitored during session    Home Living                      Prior Function            PT Goals (current goals can now be found in the care plan section) Progress towards PT goals: Progressing toward goals    Frequency    Min 2X/week      PT Plan Current plan remains appropriate  Co-evaluation              AM-PAC PT "6 Clicks" Daily Activity  Outcome Measure  Difficulty turning over in bed (including adjusting bedclothes, sheets and blankets)?: Unable Difficulty moving from lying on back to sitting on the side of the bed? : Unable Difficulty sitting down on and standing up from a chair with arms (e.g., wheelchair, bedside commode, etc,.)?: Unable Help needed moving to and from a bed to chair (including a wheelchair)?: Total Help needed walking in hospital room?: Total Help needed climbing 3-5 steps with a railing? : Total 6 Click Score: 6    End of Session Equipment Utilized During Treatment: Gait belt Activity Tolerance: Patient tolerated treatment well Patient left: in bed Nurse Communication: Mobility status;Need for lift equipment (HOYER LIFT for any OOB activity) PT Visit Diagnosis: Muscle weakness (generalized) (M62.81);Other abnormalities of gait and mobility (R26.89)     Time:  1555-1620 PT Time Calculation (min) (ACUTE ONLY): 25 min  Charges:  $Therapeutic Exercise: 8-22 mins $Therapeutic Activity: 8-22 mins                    G Codes:        Rica Koyanagi  PTA [facility]  Acute  Rehab Pager      [phone]

## 2017-09-28 NOTE — Progress Notes (Signed)
CSW following for assistance with planning transition to SNF at [place].  [facility] still reviewing pt's referral. Spoke with pt's husband- he reports at this point he would like to focus on [place] area rather than [facility].  Requests [facility]. CSW informed him referral there is still pending- facility advised no beds available but they will review referral when able. Advised husband that current [facility] area bed offer in place from [facility]. He states he would consider this if [facility] unable to accommodate.  Will continue following.  Sharren Bridge, MSW, LCSW Clinical Social Work 09/28/2017 [phone]

## 2017-09-29 LAB — BASIC METABOLIC PANEL
ANION GAP: 6 (ref 5–15)
BUN: 46 mg/dL — ABNORMAL HIGH (ref 6–20)
CALCIUM: 7.4 mg/dL — AB (ref 8.9–10.3)
CO2: 28 mmol/L (ref 22–32)
Chloride: 106 mmol/L (ref 101–111)
Creatinine, Ser: 0.55 mg/dL (ref 0.44–1.00)
GLUCOSE: 109 mg/dL — AB (ref 65–99)
POTASSIUM: 4.2 mmol/L (ref 3.5–5.1)
Sodium: 140 mmol/L (ref 135–145)

## 2017-09-29 LAB — CBC
HCT: 24.2 % — ABNORMAL LOW (ref 36.0–46.0)
HEMOGLOBIN: 8 g/dL — AB (ref 12.0–15.0)
MCH: 31 pg (ref 26.0–34.0)
MCHC: 33.1 g/dL (ref 30.0–36.0)
MCV: 93.8 fL (ref 78.0–100.0)
Platelets: 105 10*3/uL — ABNORMAL LOW (ref 150–400)
RBC: 2.58 MIL/uL — AB (ref 3.87–5.11)
RDW: 20.4 % — ABNORMAL HIGH (ref 11.5–15.5)
WBC: 5.1 10*3/uL (ref 4.0–10.5)

## 2017-09-29 LAB — MAGNESIUM: Magnesium: 1.9 mg/dL (ref 1.7–2.4)

## 2017-09-29 NOTE — Progress Notes (Signed)
Patient ID: Zoriana Oats, female   DOB: May 28, 1946, 71 y.o.   MRN: [id number]    PROGRESS NOTE  Massiel Stipp  [id number]:[id number] DOB: [birth year] DOA: 09/10/2017  PCP: [name], MD   Brief Narrative:  Pt is 71 yo female with known DLBCL (started on chemo Cytoxan and Vincristine on 9/20), RA, HLD, HTN, recent hospitalization for right sided hydronephrosis and is now s/p perc nephrostomy tube placement and exchange on 08/31/2017 after tube malfunction, presented with weakness, found to be hypotensive with presumptive sepsis. In ED, pt has received boluses of IV NS and was admitted for further evaluation. She has initially required vasopressor support and received significant IVF resuscitation. Pressors were weaned and diuresis started, slowly improving.    Assessment & Plan: Anasarca/acute on chronic diastolic CHF: Echocardiogram on 08/30/2017 shows grade 1 diastolic dysfunction. EF 60-65%. - UOP robust with lasix 40mg  IV BID, will continue this as creatinine and BP stable. Still with LE swelling. Dry weight should be close to 180lbs per family and rose to 248lbs. Was up >13L on 9/28. - weight trend below Filed Weights   09/27/17 1808 09/28/17 0440 09/29/17 0447  Weight: 89.5 kg (197 lb 4.8 oz) 88 kg (194 lb) 87.8 kg (193 lb 9.6 oz)  - continue to monitor daily weights, strict I/O  Hypotension: suspected distributive shock, third-spacing IVF's administered earlier this hospitalization. Has been off pressors since 09/14/2017 - No evidence of sepsis at this time. Blood cultures NGTD  - s/p 5 days abx.  - continue Prednisone taper per oncology  - BP overall stable   Acute respiratory distress: Suspect acute diastolic CHF with pleural effusions and parainfluenza virus.  - Improved with thoracentesis 10/4, 580cc transudate. Repeat imaging showing very small effusions, no indication for repeat.  - pt denies dyspnea this AM, reports feeling more comfortable   Right-sided hydronephrosis:  Thought to be due to extrinsic compression due to malignancy, now s/p replacement of perc nephrostomy tube 9/14. Output is good. - Discussed with urology, Dr. Gloriann Loan. Nephrostomy tube without clinical evidence of malfunction, so no indication to exchange at this time. Can remain in up to 3 months, so will have follow up with urology, Dr. Karsten Ro, as outpatient.  - Cause of hydronephrosis is being addressed   Hypokalemia and hypomagnesemia: - K and Mg is WNL  DLBCL: Incidentally noted regression of lymphadenopathy on CT chest after 1st cycle of chemotherapy.  - Appreciate heme/onc assistance: chemo 10/10 & 10/11. S/P intrathecal MTX 10/9.   Severe protein calorie malnutrition in setting of morbid obesity: - Continue po supplementation. Hoping to improve lean body mass.   Anemia and pancytopenia: Suspect chemotx induced, improving s/p G-CSF. Hgb trending downward without active bleeding. Currently without symptoms. - 1u PRBCs 10/8, hgb stable since.  - Per onc, should receive irradiated blood products.  Constipation: - Cont bowel regimen   Rheumatoid arthritis. - On methotrexate in the past currently not on any medication.  DVT prophylaxis: SCD's Code Status: Full Family Communication: family at bedside  Disposition Plan: d/c in several days when anasarca improves and blood counts stable   Consultants:   Oncology  PCCM   Nephrology by phone 10/6.  Urology by phone 10/7  Procedures:   ECHO 9/13: - Left ventricle: The cavity size was normal. Wall thickness was   normal. Systolic function was normal. The estimated ejection   fraction was in the range of 60% to 65%. Wall motion was normal;   there were no regional wall motion abnormalities. Doppler  parameters are consistent with abnormal left ventricular   relaxation (grade 1 diastolic dysfunction). - Pulmonary arteries: Systolic pressure was mildly increased. PA   peak pressure: 36 mm Hg (S).   Antimicrobials:    Vanc and Zosyn x5 days   Subjective: Pt reports feeling better but still volume overloaded.   Objective: Vitals:   09/28/17 0440 09/28/17 1519 09/28/17 2036 09/29/17 0447  BP: 110/71 (!) 127/99 100/64 109/66  Pulse: 65 75 76 67  Resp: 18 18 18 18   Temp: 98 F (36.7 C) (!) 97.5 F (36.4 C) 98 F (36.7 C) 98.6 F (37 C)  TempSrc: Oral Oral Oral Oral  SpO2: 97% 100% 100% 100%  Weight: 88 kg (194 lb)   87.8 kg (193 lb 9.6 oz)  Height:        Intake/Output Summary (Last 24 hours) at 09/29/17 1137 Last data filed at 09/29/17 1100  Gross per 24 hour  Intake              605 ml  Output             3425 ml  Net            -2820 ml   Filed Weights   09/27/17 1808 09/28/17 0440 09/29/17 0447  Weight: 89.5 kg (197 lb 4.8 oz) 88 kg (194 lb) 87.8 kg (193 lb 9.6 oz)   Physical Exam  Constitutional: Appears calm, NAD CVS: RRR, S1/S2 +, no murmurs, no gallops, no carotid bruit.  Pulmonary: Effort and breath sounds normal, still mild crackles at bases  Abdominal: Soft. BS +,  no distension, tenderness, rebound or guarding.  Musculoskeletal: Normal range of motion. +2 LE edema  Skin: Skin is warm and dry. No rash noted. Not diaphoretic. No erythema. No pallor.  Psychiatric: Normal mood and affect. Behavior, judgment, thought content normal.   CBC:  Recent Labs Lab 09/25/17 0523 09/26/17 0640 09/27/17 0500 09/28/17 0529 09/29/17 0453  WBC 8.7 6.5 6.6 6.9 5.1  HGB 8.8* 8.6* 8.3* 8.6* 8.0*  HCT 26.6* 26.4* 25.5* 25.8* 24.2*  MCV 94.3 95.3 96.2 94.2 93.8  PLT 106* 126* 127* 112* 474*   Basic Metabolic Panel:  Recent Labs Lab 09/24/17 0359 09/25/17 0523 09/27/17 0500 09/28/17 0529 09/29/17 0453  NA 138 141 139 139 140  K 4.1 4.0 3.8 4.0 4.2  CL 103 107 104 102 106  CO2 29 28 30 28 28   GLUCOSE 82 85 99 116* 109*  BUN 36* 32* 30* 39* 46*  CREATININE 0.54 0.55 0.61 0.69 0.55  CALCIUM 7.2* 7.3* 7.4* 7.3* 7.4*  MG  --   --  1.6* 2.0 1.9   Liver Function  Tests:  Recent Labs Lab 09/25/17 0523  AST 24  ALT 27  ALKPHOS 121  BILITOT 0.8  PROT 4.2*  ALBUMIN 1.8*   Urine analysis:    Component Value Date/Time   COLORURINE YELLOW 09/11/2017 1650   APPEARANCEUR TURBID (A) 09/11/2017 1650   LABSPEC 1.018 09/11/2017 1650   PHURINE 5.0 09/11/2017 1650   GLUCOSEU NEGATIVE 09/11/2017 1650   HGBUR LARGE (A) 09/11/2017 1650   BILIRUBINUR NEGATIVE 09/11/2017 1650   KETONESUR NEGATIVE 09/11/2017 1650   PROTEINUR NEGATIVE 09/11/2017 1650   NITRITE NEGATIVE 09/11/2017 1650   LEUKOCYTESUR TRACE (A) 09/11/2017 1650   Recent Results (from the past 240 hour(s))  Culture, body fluid-bottle     Status: None   Collection Time: 09/20/17  3:54 PM  Result Value Ref Range Status   Specimen  Description FLUID LEFT PLEURAL  Final   Special Requests NONE  Final   Culture   Final    NO GROWTH 5 DAYS Performed at [facility], [place] [address] [place], [place] [address]    Report Status 09/25/2017 FINAL  Final  Gram stain     Status: None   Collection Time: 09/20/17  3:54 PM  Result Value Ref Range Status   Specimen Description FLUID LEFT PLEURAL  Final   Special Requests NONE  Final   Gram Stain   Final    FEW WBC PRESENT,BOTH PMN AND MONONUCLEAR NO ORGANISMS SEEN Performed at [facility], [address], [place], [place] [address]    Report Status 09/20/2017 FINAL  Final    Radiology Studies: Ct Chest Wo Contrast Result Date: 09/19/2017 1. Regression of previously noted lymphadenopathy indicative of a positive response to therapy.  2. New patchy areas of mid to upper lung predominant ground-glass attenuation and interlobular septal thickening. This is nonspecific, but given the normal cardiac size, this does not appear to reflect cardiogenic pulmonary edema. Noncardiogenic edema, alveolar hemorrhage and/or acute atypical infection are the primary differential considerations.  3. Increasing moderate bilateral pleural effusions, large  volume of ascites, and diffuse body wall edema, suggestive of a state of underlying anasarca.  4. Aortic atherosclerosis, in addition to left anterior descending coronary artery disease. Assessment for potential risk factor modification, dietary therapy or pharmacologic therapy may be warranted, if clinically indicated.  5. Mild diffuse bronchial wall thickening with mild centrilobular and paraseptal emphysema; imaging findings suggestive of underlying COPD.    Dg Chest Port 1 View Result Date: 09/20/2017 Result Date: 09/20/2017 Successful ultrasound guided diagnostic and therapeutic left thoracentesis yielding 580 cc of pleural fluid.  Scheduled Meds: . benzonatate  100 mg Oral TID  . Chlorhexidine Gluconate Cloth  6 each Topical q morning - 10a  . furosemide  40 mg Intravenous BID  . pantoprazole  40 mg Oral Daily  . potassium chloride  40 mEq Oral BID  . predniSONE  60 mg Oral Q breakfast  . protein supplement shake  2 oz Oral QID   Continuous Infusions: . sodium chloride       LOS: 18 days   Time spent: 25 minutes   Faye Ramsay, MD [facility] Pager [phone]  If 7PM-7AM, please contact night-coverage [email or web address] Password [email or web address] 09/29/2017, 11:37 AM

## 2017-09-30 LAB — BASIC METABOLIC PANEL
Anion gap: 7 (ref 5–15)
BUN: 47 mg/dL — ABNORMAL HIGH (ref 6–20)
CO2: 28 mmol/L (ref 22–32)
Calcium: 7.7 mg/dL — ABNORMAL LOW (ref 8.9–10.3)
Chloride: 104 mmol/L (ref 101–111)
Creatinine, Ser: 0.56 mg/dL (ref 0.44–1.00)
GFR calc Af Amer: >60 mL/min (ref >60)
GFR calc non Af Amer: >60 mL/min (ref >60)
Glucose, Bld: 100 mg/dL — ABNORMAL HIGH (ref 65–99)
Potassium: 4 mmol/L (ref 3.5–5.1)
Sodium: 139 mmol/L (ref 135–145)

## 2017-09-30 LAB — CBC
HEMATOCRIT: 23.2 % — AB (ref 36.0–46.0)
HEMOGLOBIN: 7.8 g/dL — AB (ref 12.0–15.0)
MCH: 31.7 pg (ref 26.0–34.0)
MCHC: 33.6 g/dL (ref 30.0–36.0)
MCV: 94.3 fL (ref 78.0–100.0)
Platelets: 87 10*3/uL — ABNORMAL LOW (ref 150–400)
RBC: 2.46 MIL/uL — ABNORMAL LOW (ref 3.87–5.11)
RDW: 20.3 % — AB (ref 11.5–15.5)
WBC: 4.9 10*3/uL (ref 4.0–10.5)

## 2017-09-30 MED ORDER — GLYCERIN (LAXATIVE) 2.1 G RE SUPP
1.0000 | RECTAL | Status: DC | PRN
  Administered 2017-10-02: 1 via RECTAL
  Filled 2017-09-30 (×3): qty 1

## 2017-09-30 NOTE — Progress Notes (Signed)
Patient ID: Kristina Torres, female   DOB: 11/06/1946, 71 y.o.   MRN: [id number]    PROGRESS NOTE  Kristina Torres  BOF:[id number] DOB: [birth year] DOA: 09/10/2017  PCP: [name], MD   Brief Narrative:  Pt is 70 yo female with known DLBCL (started on chemo Cytoxan and Vincristine on 9/20), RA, HLD, HTN, recent hospitalization for right sided hydronephrosis and is now s/p perc nephrostomy tube placement and exchange on 08/31/2017 after tube malfunction, presented with weakness, found to be hypotensive with presumptive sepsis. In ED, pt has received boluses of IV NS and was admitted for further evaluation. She has initially required vasopressor support and received significant IVF resuscitation. Pressors were weaned and diuresis started, slowly improving.    Assessment & Plan: Anasarca/acute on chronic diastolic CHF: Echocardiogram on 08/30/2017 shows grade 1 diastolic dysfunction. EF 60-65%. - UOP robust with lasix 40mg  IV BID, will continue this as creatinine and BP stable. Still with LE swelling. Dry weight should be close to 180lbs per family and rose to 248lbs. Was up >13L on 9/28. - weight trend below Filed Weights   09/27/17 1808 09/28/17 0440 09/29/17 0447  Weight: 89.5 kg (197 lb 4.8 oz) 88 kg (194 lb) 87.8 kg (193 lb 9.6 oz)  - continue to monitor daily weights, I/O  Hypotension: suspected distributive shock, third-spacing IVF's administered earlier this hospitalization. Has been off pressors since 09/14/2017 - No evidence of sepsis at this time. Blood cultures NGTD  - s/p 5 days abx.  - continue Prednisone taper per oncology  - BP stable   Acute respiratory distress: Suspect acute diastolic CHF with pleural effusions and parainfluenza virus.  - Improved with thoracentesis 10/4, 580cc transudate. Repeat imaging showing very small effusions, no indication for repeat.  - pt denies dyspnea this AM   Right-sided hydronephrosis: Thought to be due to extrinsic compression due to  malignancy, now s/p replacement of perc nephrostomy tube 9/14. Output is good. - Discussed with urology, Dr. Gloriann Loan. Nephrostomy tube without clinical evidence of malfunction, so no indication to exchange at this time. Can remain in up to 3 months, so will have follow up with urology, Dr. Karsten Ro, as outpatient.  - Cause of hydronephrosis is being addressed   Hypokalemia and hypomagnesemia: - K and Mg WNL  DLBCL: Incidentally noted regression of lymphadenopathy on CT chest after 1st cycle of chemotherapy.  - Appreciate heme/onc assistance: chemo 10/10 & 10/11. S/P intrathecal MTX 10/9.   Severe protein calorie malnutrition in setting of morbid obesity: - Continue po supplementation. Hoping to improve lean body mass.   Anemia and pancytopenia: Suspect chemotx induced, improving s/p G-CSF. - Hg somewhat down overnight and Plt also down a bit - 1u PRBCs 10/8, hgb stable since.  - Per onc, should receive irradiated blood products. - will repeat CBC in AM  Constipation: - added suppository per pt's request   Rheumatoid arthritis. - On methotrexate in the past currently not on any medication.  DVT prophylaxis: SCD's Code Status: Full Family Communication: family at bedside  Disposition Plan: d/c in several days when anasarca improves and blood counts stable   Consultants:   Oncology  PCCM   Nephrology by phone 10/6.  Urology by phone 10/7  Procedures:   ECHO 9/13: EF 60% to 77%, grade 1 diastolic dysfunction  Antimicrobials:   Vanc and Zosyn x5 days   Subjective: No events overnight.  Objective: Vitals:   09/29/17 0447 09/29/17 1448 09/29/17 2018 09/30/17 0438  BP: 109/66 107/84 109/68 (!) 106/57  Pulse: 67 85 78 61  Resp: 18 16 16  (!) 21  Temp: 98.6 F (37 C) 97.8 F (36.6 C) 98.2 F (36.8 C) 98.5 F (36.9 C)  TempSrc: Oral Oral Oral Oral  SpO2: 100% 100% 98% 98%  Weight: 87.8 kg (193 lb 9.6 oz)     Height:        Intake/Output Summary (Last 24 hours)  at 09/30/17 1136 Last data filed at 09/30/17 0003  Gross per 24 hour  Intake              480 ml  Output             2351 ml  Net            -1871 ml   Filed Weights   09/27/17 1808 09/28/17 0440 09/29/17 0447  Weight: 89.5 kg (197 lb 4.8 oz) 88 kg (194 lb) 87.8 kg (193 lb 9.6 oz)   Physical Exam  Constitutional: Appears calm, NAD  CVS: RRR, S1/S2 +, no murmurs, no gallops, no carotid bruit.  Pulmonary: Effort and breath sounds normal, minimal crackles at bases  Abdominal: Soft. BS +,  no distension, tenderness, rebound or guarding.  Musculoskeletal: Normal range of motion. +2 bilateral LE edema   CBC:  Recent Labs Lab 09/26/17 0640 09/27/17 0500 09/28/17 0529 09/29/17 0453 09/30/17 0432  WBC 6.5 6.6 6.9 5.1 4.9  HGB 8.6* 8.3* 8.6* 8.0* 7.8*  HCT 26.4* 25.5* 25.8* 24.2* 23.2*  MCV 95.3 96.2 94.2 93.8 94.3  PLT 126* 127* 112* 105* 87*   Basic Metabolic Panel:  Recent Labs Lab 09/25/17 0523 09/27/17 0500 09/28/17 0529 09/29/17 0453 09/30/17 0432  NA 141 139 139 140 139  K 4.0 3.8 4.0 4.2 4.0  CL 107 104 102 106 104  CO2 28 30 28 28 28   GLUCOSE 85 99 116* 109* 100*  BUN 32* 30* 39* 46* 47*  CREATININE 0.55 0.61 0.69 0.55 0.56  CALCIUM 7.3* 7.4* 7.3* 7.4* 7.7*  MG  --  1.6* 2.0 1.9  --    Liver Function Tests:  Recent Labs Lab 09/25/17 0523  AST 24  ALT 27  ALKPHOS 121  BILITOT 0.8  PROT 4.2*  ALBUMIN 1.8*   Urine analysis:    Component Value Date/Time   COLORURINE YELLOW 09/11/2017 1650   APPEARANCEUR TURBID (A) 09/11/2017 1650   LABSPEC 1.018 09/11/2017 1650   PHURINE 5.0 09/11/2017 1650   GLUCOSEU NEGATIVE 09/11/2017 1650   HGBUR LARGE (A) 09/11/2017 1650   BILIRUBINUR NEGATIVE 09/11/2017 1650   KETONESUR NEGATIVE 09/11/2017 1650   PROTEINUR NEGATIVE 09/11/2017 1650   NITRITE NEGATIVE 09/11/2017 1650   LEUKOCYTESUR TRACE (A) 09/11/2017 1650   Recent Results (from the past 240 hour(s))  Culture, body fluid-bottle     Status: None    Collection Time: 09/20/17  3:54 PM  Result Value Ref Range Status   Specimen Description FLUID LEFT PLEURAL  Final   Special Requests NONE  Final   Culture   Final    NO GROWTH 5 DAYS Performed at [facility], [place] [address], [place], [place] [address]    Report Status 09/25/2017 FINAL  Final  Gram stain     Status: None   Collection Time: 09/20/17  3:54 PM  Result Value Ref Range Status   Specimen Description FLUID LEFT PLEURAL  Final   Special Requests NONE  Final   Gram Stain   Final    FEW WBC PRESENT,BOTH PMN AND MONONUCLEAR  NO ORGANISMS SEEN Performed at [facility], [place] [address], [place], [place] [address]    Report Status 09/20/2017 FINAL  Final    Radiology Studies: Ct Chest Wo Contrast Result Date: 09/19/2017 1. Regression of previously noted lymphadenopathy indicative of a positive response to therapy.  2. New patchy areas of mid to upper lung predominant ground-glass attenuation and interlobular septal thickening. This is nonspecific, but given the normal cardiac size, this does not appear to reflect cardiogenic pulmonary edema. Noncardiogenic edema, alveolar hemorrhage and/or acute atypical infection are the primary differential considerations.  3. Increasing moderate bilateral pleural effusions, large volume of ascites, and diffuse body wall edema, suggestive of a state of underlying anasarca.  4. Aortic atherosclerosis, in addition to left anterior descending coronary artery disease. Assessment for potential risk factor modification, dietary therapy or pharmacologic therapy may be warranted, if clinically indicated.  5. Mild diffuse bronchial wall thickening with mild centrilobular and paraseptal emphysema; imaging findings suggestive of underlying COPD.    Dg Chest Port 1 View Result Date: 09/20/2017 Result Date: 09/20/2017 Successful ultrasound guided diagnostic and therapeutic left thoracentesis yielding 580 cc of pleural fluid.  Scheduled Meds: .  benzonatate  100 mg Oral TID  . furosemide  40 mg Intravenous BID  . pantoprazole  40 mg Oral Daily  . potassium chloride  40 mEq Oral BID  . protein supplement shake  2 oz Oral QID   Continuous Infusions: . sodium chloride      LOS: 19 days   Time spent: 25 minutes   Faye Ramsay, MD [facility] Pager [phone]  If 7PM-7AM, please contact night-coverage [email or web address] Password [email or web address] 09/30/2017, 11:36 AM

## 2017-10-01 LAB — BASIC METABOLIC PANEL
ANION GAP: 8 (ref 5–15)
BUN: 43 mg/dL — ABNORMAL HIGH (ref 6–20)
CHLORIDE: 103 mmol/L (ref 101–111)
CO2: 28 mmol/L (ref 22–32)
CREATININE: 0.53 mg/dL (ref 0.44–1.00)
Calcium: 7.9 mg/dL — ABNORMAL LOW (ref 8.9–10.3)
GFR calc non Af Amer: >60 mL/min (ref >60)
Glucose, Bld: 97 mg/dL (ref 65–99)
Potassium: 4.1 mmol/L (ref 3.5–5.1)
SODIUM: 139 mmol/L (ref 135–145)

## 2017-10-01 LAB — CBC
HCT: 22.5 % — ABNORMAL LOW (ref 36.0–46.0)
HEMOGLOBIN: 7.6 g/dL — AB (ref 12.0–15.0)
MCH: 31.7 pg (ref 26.0–34.0)
MCHC: 33.8 g/dL (ref 30.0–36.0)
MCV: 93.8 fL (ref 78.0–100.0)
PLATELETS: 63 10*3/uL — AB (ref 150–400)
RBC: 2.4 MIL/uL — AB (ref 3.87–5.11)
RDW: 19.9 % — ABNORMAL HIGH (ref 11.5–15.5)
WBC: 4.6 10*3/uL (ref 4.0–10.5)

## 2017-10-01 LAB — HEMOGLOBIN AND HEMATOCRIT, BLOOD
HCT: 27.1 % — ABNORMAL LOW (ref 36.0–46.0)
Hemoglobin: 9.3 g/dL — ABNORMAL LOW (ref 12.0–15.0)

## 2017-10-01 LAB — MAGNESIUM: MAGNESIUM: 1.9 mg/dL (ref 1.7–2.4)

## 2017-10-01 LAB — PREPARE RBC (CROSSMATCH)

## 2017-10-01 MED ORDER — SODIUM CHLORIDE 0.9 % IV SOLN
Freq: Once | INTRAVENOUS | Status: AC
  Administered 2017-10-01: 13:00:00 via INTRAVENOUS

## 2017-10-01 NOTE — Progress Notes (Addendum)
Patient ID: Kristina Torres, female   DOB: [birth year], 71 y.o.   MRN: [id number]    PROGRESS NOTE  Kristina Torres  DGL:[id number] DOB: 1946-04-26 DOA: 09/10/2017  PCP: [name], MD   Brief Narrative:  Pt is 71 yo female with known DLBCL (started on chemo Cytoxan and Vincristine on 9/20), RA, HLD, HTN, recent hospitalization for right sided hydronephrosis and is now s/p perc nephrostomy tube placement and exchange on 08/31/2017 after tube malfunction, presented with weakness, found to be hypotensive with presumptive sepsis. In ED, pt has received boluses of IV NS and was admitted for further evaluation. She has initially required vasopressor support and received significant IVF resuscitation. Pressors were weaned and diuresis started, slowly improving.    Assessment & Plan: Anasarca/acute on chronic diastolic CHF: Echocardiogram on 08/30/2017 shows grade 1 diastolic dysfunction. EF 60-65%. - UOP robust with lasix 40mg  IV BID, will continue this as creatinine and BP stable. Still with LE swelling. Dry weight should be close to 180lbs per family and rose to 248lbs. Was up >13L on 9/28. - weight trend below Filed Weights   09/28/17 0440 09/29/17 0447 10/01/17 0513  Weight: 88 kg (194 lb) 87.8 kg (193 lb 9.6 oz) 82.1 kg (181 lb 1.6 oz)  - continue to monitor daily weights, I/O  Hypotension: suspected distributive shock, third-spacing IVF's administered earlier this hospitalization. Has been off pressors since 09/14/2017 - No evidence of sepsis at this time. Blood cultures NGTD  - s/p 5 days abx.  - continue Prednisone taper per oncology  - BP overall stable   Acute respiratory distress: Suspect acute diastolic CHF with pleural effusions and parainfluenza virus.  - Improved with thoracentesis 10/4, 580cc transudate. Repeat imaging showing very small effusions, no indication for repeat.  - resp status stable this AM   Right-sided hydronephrosis: Thought to be due to extrinsic compression  due to malignancy, now s/p replacement of perc nephrostomy tube 9/14. Output is good. - Discussed with urology, Dr. Gloriann Loan. Nephrostomy tube without clinical evidence of malfunction, so no indication to exchange at this time. Can remain in up to 3 months, so will have follow up with urology, Dr. Karsten Ro, as outpatient.  - Cause of hydronephrosis is being addressed   Hypokalemia and hypomagnesemia: - K and Mg WNL  DLBCL: Incidentally noted regression of lymphadenopathy on CT chest after 1st cycle of chemotherapy.  - Appreciate heme/onc assistance: chemo 10/10 & 10/11. S/P intrathecal MTX 10/9.   Severe protein calorie malnutrition in setting of morbid obesity: - Continue po supplementation. Hoping to improve lean body mass.   Anemia and pancytopenia: Suspect chemotx induced, improving s/p G-CSF. - Hg somewhat down overnight and Plt also down a bit - 1u PRBCs 10/8, hgb stable since.  - Per onc, should receive irradiated blood products. - transfuse one U PRBC today as Hg is down  - CBC in AM  Constipation: - had BM  Rheumatoid arthritis. - On methotrexate in the past currently not on any medication.  Obesity  - Body mass index is 37.85 kg/m.  DVT prophylaxis: SCD's Code Status: Full Family Communication: family at bedside  Disposition Plan: d/c in several days when anasarca improves and blood counts stable   Consultants:   Oncology  PCCM   Nephrology by phone 10/6.  Urology by phone 10/7  Procedures:   ECHO 9/13: EF 60% to 51%, grade 1 diastolic dysfunction  Antimicrobials:   Vanc and Zosyn x5 days   Subjective: Pt reports feeling better.   Objective:  Vitals:   09/30/17 0438 09/30/17 1404 09/30/17 1942 10/01/17 0513  BP: (!) 106/57 108/63 121/73 122/69  Pulse: 61 81 75 70  Resp: (!) 21 18 18 16   Temp: 98.5 F (36.9 C) 98.3 F (36.8 C) 98 F (36.7 C) 98.2 F (36.8 C)  TempSrc: Oral Oral Oral Oral  SpO2: 98% 96% 99% 98%  Weight:    82.1 kg (181 lb 1.6  oz)  Height:        Intake/Output Summary (Last 24 hours) at 10/01/17 0714 Last data filed at 10/01/17 0514  Gross per 24 hour  Intake              480 ml  Output             5050 ml  Net            -4570 ml   Filed Weights   09/28/17 0440 09/29/17 0447 10/01/17 0513  Weight: 88 kg (194 lb) 87.8 kg (193 lb 9.6 oz) 82.1 kg (181 lb 1.6 oz)   Physical Exam  Constitutional: Appears calm, NAD CVS: RRR, S1/S2 +, no murmurs, no gallops, no carotid bruit.  Pulmonary: Effort and breath sounds normal, no stridor, rhonchi, wheezes, rales.  Abdominal: Soft. BS +,  no distension, tenderness, rebound or guarding.  Musculoskeletal: Normal range of motion. +2 bilateral LE edema   CBC:  Recent Labs Lab 09/27/17 0500 09/28/17 0529 09/29/17 0453 09/30/17 0432 10/01/17 0459  WBC 6.6 6.9 5.1 4.9 4.6  HGB 8.3* 8.6* 8.0* 7.8* 7.6*  HCT 25.5* 25.8* 24.2* 23.2* 22.5*  MCV 96.2 94.2 93.8 94.3 93.8  PLT 127* 112* 105* 87* 63*   Basic Metabolic Panel:  Recent Labs Lab 09/27/17 0500 09/28/17 0529 09/29/17 0453 09/30/17 0432 10/01/17 0459  NA 139 139 140 139 139  K 3.8 4.0 4.2 4.0 4.1  CL 104 102 106 104 103  CO2 30 28 28 28 28   GLUCOSE 99 116* 109* 100* 97  BUN 30* 39* 46* 47* 43*  CREATININE 0.61 0.69 0.55 0.56 0.53  CALCIUM 7.4* 7.3* 7.4* 7.7* 7.9*  MG 1.6* 2.0 1.9  --  1.9   Liver Function Tests:  Recent Labs Lab 09/25/17 0523  AST 24  ALT 27  ALKPHOS 121  BILITOT 0.8  PROT 4.2*  ALBUMIN 1.8*   Urine analysis:    Component Value Date/Time   COLORURINE YELLOW 09/11/2017 1650   APPEARANCEUR TURBID (A) 09/11/2017 1650   LABSPEC 1.018 09/11/2017 1650   PHURINE 5.0 09/11/2017 1650   GLUCOSEU NEGATIVE 09/11/2017 1650   HGBUR LARGE (A) 09/11/2017 1650   BILIRUBINUR NEGATIVE 09/11/2017 1650   KETONESUR NEGATIVE 09/11/2017 1650   PROTEINUR NEGATIVE 09/11/2017 1650   NITRITE NEGATIVE 09/11/2017 1650   LEUKOCYTESUR TRACE (A) 09/11/2017 1650   No results found for this or  any previous visit (from the past 240 hour(s)).  Radiology Studies: Ct Chest Wo Contrast Result Date: 09/19/2017 1. Regression of previously noted lymphadenopathy indicative of a positive response to therapy.  2. New patchy areas of mid to upper lung predominant ground-glass attenuation and interlobular septal thickening. This is nonspecific, but given the normal cardiac size, this does not appear to reflect cardiogenic pulmonary edema. Noncardiogenic edema, alveolar hemorrhage and/or acute atypical infection are the primary differential considerations.  3. Increasing moderate bilateral pleural effusions, large volume of ascites, and diffuse body wall edema, suggestive of a state of underlying anasarca.  4. Aortic atherosclerosis, in addition to left anterior descending coronary artery  disease. Assessment for potential risk factor modification, dietary therapy or pharmacologic therapy may be warranted, if clinically indicated.  5. Mild diffuse bronchial wall thickening with mild centrilobular and paraseptal emphysema; imaging findings suggestive of underlying COPD.    Dg Chest Port 1 View Result Date: 09/20/2017 Result Date: 09/20/2017 Successful ultrasound guided diagnostic and therapeutic left thoracentesis yielding 580 cc of pleural fluid.  Scheduled Meds: . benzonatate  100 mg Oral TID  . furosemide  40 mg Intravenous BID  . pantoprazole  40 mg Oral Daily  . potassium chloride  40 mEq Oral BID  . protein supplement shake  2 oz Oral QID   Continuous Infusions: . sodium chloride      LOS: 20 days   Time spent: 25 minutes   Faye Ramsay, MD [facility] Pager [phone]  If 7PM-7AM, please contact night-coverage [email or web address] Password [email or web address] 10/01/2017, 7:14 AM

## 2017-10-01 NOTE — Progress Notes (Signed)
Physical Therapy Treatment Patient Details Name: Kristina Torres MRN: [id number] DOB: Sep 16, 1946 Today's Date: 10/01/2017    History of Present Illness 71 y.o. Female with history of multiple medical problems including rheumatoid arthritis, hyperlipidemia, history of hypertension, and diffuse large B-cell lymphoma. Patient recently hospitalized with right-sided hydronephrosis status post percutaneous nephrostomy tube and exchange on 9/14 after tube malfunction. Patient started on chemotherapy with Cytoxan and vincristine on 9/20 and subsequently discharged to rehabilitation on 9/22. Patient's tube was fractured at her nursing facility and returned to hospital and was found to be hypotensive with borderline blood pressure for at least 2 weeks. PMH: HTN, HLD, gout, RA    PT Comments    Pt present with less edema and more able to physical move on her own.  Assisted to EOB and performed some B LE TE's.  Performed sit to stand via "Bear Hug" to pivot 1/4 turn to [facility] with second assist behind to guide hips.  Pt able to tolerate standing but unable to weight shift in fear of knees buckling.  Assisted back to bed same fashion.  + 2 to scoot to [facility] and position to comfort.     Follow Up Recommendations  SNF     Equipment Recommendations  None recommended by PT    Recommendations for Other Services       Precautions / Restrictions Precautions Precautions: Fall Precaution Comments: right side nephrostomy tube Restrictions Weight Bearing Restrictions: No    Mobility  Bed Mobility Overal bed mobility: Needs Assistance Bed Mobility: Supine to Sit;Sit to Supine     Supine to sit: +2 for physical assistance;+2 for safety/equipment;Mod assist Sit to supine: Max assist;+2 for safety/equipment;+2 for physical assistance   General bed mobility comments: supine to sit + 2 assist pt 50%       sit to supine +2 assist pt 25% able  Transfers Overall transfer level: Needs assistance   Transfers:  Stand Pivot Transfers   Stand pivot transfers: Max assist;+2 safety/equipment;From elevated surface       General transfer comment: "Bear Hug" stand pivot 1/4 turn from elevated bed to [facility] and back while blocking knees and second assist guiding hips    pt unable to functionally weightshift or advance either LE in fear of knee buckle.    Ambulation/Gait                 Stairs            Wheelchair Mobility    Modified Rankin (Stroke Patients Only)       Balance                                            Cognition Arousal/Alertness: Awake/alert Behavior During Therapy: WFL for tasks assessed/performed Overall Cognitive Status: Within Functional Limits for tasks assessed                                        Exercises      General Comments        Pertinent Vitals/Pain Pain Assessment: No/denies pain    Home Living                      Prior Function            PT  Goals (current goals can now be found in the care plan section) Progress towards PT goals: Progressing toward goals    Frequency    Min 2X/week      PT Plan Current plan remains appropriate    Co-evaluation              AM-PAC PT "6 Clicks" Daily Activity  Outcome Measure  Difficulty turning over in bed (including adjusting bedclothes, sheets and blankets)?: Unable Difficulty moving from lying on back to sitting on the side of the bed? : Unable Difficulty sitting down on and standing up from a chair with arms (e.g., wheelchair, bedside commode, etc,.)?: Unable Help needed moving to and from a bed to chair (including a wheelchair)?: Total Help needed walking in hospital room?: Total Help needed climbing 3-5 steps with a railing? : Total 6 Click Score: 6    End of Session Equipment Utilized During Treatment: Gait belt Activity Tolerance: Patient tolerated treatment well Patient left: in bed Nurse Communication: Mobility  status;Need for lift equipment PT Visit Diagnosis: Muscle weakness (generalized) (M62.81);Other abnormalities of gait and mobility (R26.89)     Time: 4356-8616 PT Time Calculation (min) (ACUTE ONLY): 30 min  Charges:  $Therapeutic Activity: 23-37 mins                    G Codes:       Rica Koyanagi  PTA [facility]  Acute  Rehab Pager      [phone]

## 2017-10-01 NOTE — Progress Notes (Signed)
Nutrition Follow-up  DOCUMENTATION CODES:   Morbid obesity  INTERVENTION:   Continue Premier Protein TID, each provides 160 kcal and 30g protein RD will continue to monitor for needs  NUTRITION DIAGNOSIS:   Increased nutrient needs related to catabolic illness, cancer and cancer related treatments as evidenced by estimated needs.  Ongoing.  GOAL:   Patient will meet greater than or equal to 90% of their needs  Progressing.  MONITOR:   PO intake, Supplement acceptance, Diet advancement, Weight trends, Labs, Skin  ASSESSMENT:   71 y.o. Female with history of multiple medical problems including rheumatoid arthritis, hyperlipidemia, history of hypertension, and diffuse large B-cell lymphoma. Patient recently hospitalized with right-sided hydronephrosis status post percutaneous nephrostomy tube and exchange on 9/14 after tube malfunction. Patient started on chemotherapy with Cytoxan and vincristine on 9/20 and subsequently discharged to rehabilitation on 9/22. Patient's tube was fractured at her nursing facility and patient was subsequently transferred to [facility] for evaluation.  Patient currently consuming 75-100% of meals at this time (providing ~150-450 kcal and 3-19g protein each). Continues to drink Premier Protein drinks brought from home. Patient's weight is -35 lb since admission weight of 216 lb, fluid loss.  Medications: IV Lasix BID, K-DUR tablet BID Labs reviewed: Mg WNL  Diet Order:  Diet 2 gram sodium Room service appropriate? Yes; Fluid consistency: Thin  Skin:  Reviewed, no issues  Last BM:  10/11  Height:   Ht Readings from Last 1 Encounters:  09/10/17 5' (1.524 m)    Weight:   Wt Readings from Last 1 Encounters:  10/01/17 181 lb 1.6 oz (82.1 kg)    Ideal Body Weight:  47.73 kg  BMI:  Body mass index is 37.85 kg/m.  Estimated Nutritional Needs:   Kcal:  2010-2215 (20-22 kcal/kg)  Protein:  100-110 grams  Fluid:  1.6-1.8 L/day  EDUCATION  NEEDS:   No education needs identified at this time  Clayton Bibles, MS, RD, [place] Dietitian Pager: [phone] After Hours Pager: [phone]

## 2017-10-02 ENCOUNTER — Encounter: Payer: Self-pay | Admitting: Hematology

## 2017-10-02 LAB — CBC
HCT: 27.6 % — ABNORMAL LOW (ref 36.0–46.0)
Hemoglobin: 9.4 g/dL — ABNORMAL LOW (ref 12.0–15.0)
MCH: 31.8 pg (ref 26.0–34.0)
MCHC: 34.1 g/dL (ref 30.0–36.0)
MCV: 93.2 fL (ref 78.0–100.0)
PLATELETS: 50 10*3/uL — AB (ref 150–400)
RBC: 2.96 MIL/uL — ABNORMAL LOW (ref 3.87–5.11)
RDW: 19.3 % — AB (ref 11.5–15.5)
WBC: 3.4 10*3/uL — AB (ref 4.0–10.5)

## 2017-10-02 LAB — DIFFERENTIAL
BASOS ABS: 0 10*3/uL (ref 0.0–0.1)
BASOS PCT: 1 %
Eosinophils Absolute: 0.1 10*3/uL (ref 0.0–0.7)
Eosinophils Relative: 2 %
Lymphocytes Relative: 18 %
Lymphs Abs: 0.6 10*3/uL — ABNORMAL LOW (ref 0.7–4.0)
Monocytes Absolute: 0 10*3/uL — ABNORMAL LOW (ref 0.1–1.0)
Monocytes Relative: 0 %
NEUTROS ABS: 2.9 10*3/uL (ref 1.7–7.7)
Neutrophils Relative %: 79 %

## 2017-10-02 LAB — BPAM RBC
Blood Product Expiration Date: 201810232359
ISSUE DATE / TIME: 201810151602
UNIT TYPE AND RH: 1700

## 2017-10-02 LAB — TYPE AND SCREEN
ABO/RH(D): B NEG
ANTIBODY SCREEN: NEGATIVE
UNIT DIVISION: 0

## 2017-10-02 LAB — BASIC METABOLIC PANEL
ANION GAP: 9 (ref 5–15)
BUN: 38 mg/dL — ABNORMAL HIGH (ref 6–20)
CALCIUM: 8.3 mg/dL — AB (ref 8.9–10.3)
CO2: 29 mmol/L (ref 22–32)
Chloride: 101 mmol/L (ref 101–111)
Creatinine, Ser: 0.47 mg/dL (ref 0.44–1.00)
Glucose, Bld: 89 mg/dL (ref 65–99)
Potassium: 3.9 mmol/L (ref 3.5–5.1)
Sodium: 139 mmol/L (ref 135–145)

## 2017-10-02 MED ORDER — TBO-FILGRASTIM 300 MCG/0.5ML ~~LOC~~ SOSY
300.0000 ug | PREFILLED_SYRINGE | Freq: Every day | SUBCUTANEOUS | Status: DC
  Administered 2017-10-02 – 2017-10-03 (×2): 300 ug via SUBCUTANEOUS
  Filled 2017-10-02 (×4): qty 0.5

## 2017-10-02 MED ORDER — ALBUTEROL SULFATE (2.5 MG/3ML) 0.083% IN NEBU: 2.5000 mg | INHALATION_SOLUTION | RESPIRATORY_TRACT | 12 refills | Status: DC | PRN

## 2017-10-02 MED ORDER — BENZONATATE 100 MG PO CAPS: 100.0000 mg | ORAL_CAPSULE | Freq: Three times a day (TID) | ORAL | 0 refills | Status: DC

## 2017-10-02 MED ORDER — TRAMADOL HCL 50 MG PO TABS
50.0000 mg | ORAL_TABLET | Freq: Four times a day (QID) | ORAL | 0 refills | Status: DC | PRN
Start: 2017-10-02 — End: 2017-10-03

## 2017-10-02 MED ORDER — FUROSEMIDE 20 MG PO TABS: 40.0000 mg | ORAL_TABLET | Freq: Two times a day (BID) | ORAL | 0 refills | Status: DC

## 2017-10-02 NOTE — Progress Notes (Signed)
Physical Therapy Treatment Patient Details Name: Kristina Torres MRN: [id number] DOB: 1946/07/26 Today's Date: 10/02/2017    History of Present Illness 71 y.o. Female with history of multiple medical problems including rheumatoid arthritis, hyperlipidemia, history of hypertension, and diffuse large B-cell lymphoma. Patient recently hospitalized with right-sided hydronephrosis status post percutaneous nephrostomy tube and exchange on 9/14 after tube malfunction. Patient started on chemotherapy with Cytoxan and vincristine on 9/20 and subsequently discharged to rehabilitation on 9/22. Patient's tube was fractured at her nursing facility and returned to hospital and was found to be hypotensive with borderline blood pressure for at least 2 weeks. PMH: HTN, HLD, gout, RA    PT Comments    Assisted to EOB.  Pt demonstrated increased ability to self perform requiring less assist.  Performed several sit to stand (pre gait) activities.  See mobility details below.     Follow Up Recommendations  SNF ([place])     Equipment Recommendations  None recommended by PT    Recommendations for Other Services       Precautions / Restrictions Precautions Precautions: Fall Precaution Comments: right side nephrostomy tube Restrictions Weight Bearing Restrictions: No    Mobility  Bed Mobility Overal bed mobility: Needs Assistance Bed Mobility: Supine to Sit     Supine to sit: Mod assist     General bed mobility comments: HOB elevated and Mod Assist for upper body and Mod Assist to complete scooting to EOB  Transfers Overall transfer level: Needs assistance       Stand pivot transfers: Max assist;From elevated surface       General transfer comment: "Bear Hug" while blocking knees assist to standing.  Once upright, pt able to static stand x 30 sec before c/o fatigue.  Performed 4 times  Ambulation/Gait Ambulation/Gait assistance: Max assist Ambulation Distance (Feet): 1 Feet Assistive  device: None       General Gait Details: "Bear Hug" while blocking knees, pt was able to take 2 very small lateral steps to [facility] with difficulty advancing L LE and anxiety/fear knees would buckle.  "I got to sit"   Stairs            Wheelchair Mobility    Modified Rankin (Stroke Patients Only)       Balance                                            Cognition Arousal/Alertness: Awake/alert Behavior During Therapy: WFL for tasks assessed/performed Overall Cognitive Status: Within Functional Limits for tasks assessed                                 General Comments: highly motivated      Exercises      General Comments        Pertinent Vitals/Pain Pain Assessment: No/denies pain    Home Living                      Prior Function            PT Goals (current goals can now be found in the care plan section) Progress towards PT goals: Progressing toward goals    Frequency    Min 2X/week      PT Plan Current plan remains appropriate    Co-evaluation  AM-PAC PT "6 Clicks" Daily Activity  Outcome Measure  Difficulty turning over in bed (including adjusting bedclothes, sheets and blankets)?: Unable Difficulty moving from lying on back to sitting on the side of the bed? : Unable Difficulty sitting down on and standing up from a chair with arms (e.g., wheelchair, bedside commode, etc,.)?: Unable Help needed moving to and from a bed to chair (including a wheelchair)?: Total Help needed walking in hospital room?: Total Help needed climbing 3-5 steps with a railing? : Total 6 Click Score: 6    End of Session Equipment Utilized During Treatment: Gait belt Activity Tolerance: Patient tolerated treatment well Patient left: in chair Nurse Communication: Mobility status;Need for lift equipment PT Visit Diagnosis: Muscle weakness (generalized) (M62.81);Other abnormalities of gait and mobility  (R26.89)      Time: 8756-4332 PT Time Calculation (min) (ACUTE ONLY): 27 min  Charges:  $Therapeutic Activity: 23-37 mins                    G Codes:       Rica Koyanagi  PTA [facility]  Acute  Rehab Pager      [phone]

## 2017-10-02 NOTE — Care Management Important Message (Signed)
Important Message  Patient Details  Name: Kristina Torres MRN: [id number] Date of Birth: May 24, 1946   Medicare Important Message Given:  Yes    Kerin Salen 10/02/2017, 12:25 [place] Message  Patient Details  Name: Kristina Torres MRN: [id number] Date of Birth: [birth year]   Medicare Important Message Given:  Yes    Kerin Salen 10/02/2017, 12:25 PM

## 2017-10-02 NOTE — Progress Notes (Addendum)
LCSW following for disposition:  SNF at discharge  Discussed with husband and patient plans for discharge and agreeable for [facility] Will notify facility for planning and accepting bed in anticipation of discharge in next couple of days.  LCSW followed up with facility [facility]) who needs the following information regarding care plan for patient: 1. Will patient discharge on chemo medications, and if so which drugs and dosage.  LCSW will follow up in AM with treatment team and with patient regarding oncology plan.    Will continue to follow and assist with needs.  Lane Hacker, MSW Clinical Social Work: [employer] Cover[employer] for :  [phone]

## 2017-10-02 NOTE — Discharge Summary (Signed)
Physician Discharge Summary  Kristina Torres LNL:[id number] DOB: 12-15-1946 DOA: 09/10/2017  PCP: [name], MD  Admit date: 09/10/2017 Discharge date: 10/02/2017  Recommendations for Outpatient Follow-up:  1. Pt will need to follow up with PCP in 1-2 weeks post discharge 2. Please obtain BMP to evaluate electrolytes and kidney function 3. Please also check CBC to evaluate Hg and Hct levels, Plt, WBC  Discharge Diagnoses:  Active Problems:   Hypotension   DLBCL (diffuse large B cell lymphoma) (HCC)   Antineoplastic chemotherapy induced pancytopenia (HCC)   SOB (shortness of breath)   Acute respiratory failure with hypoxia (HCC)   Obstructive uropathy  Discharge Condition: Stable  Diet recommendation: Heart healthy diet discussed in details   Brief Narrative:  Pt is 71 yo female with known DLBCL (started on chemo Cytoxan and Vincristine on 9/20), RA, HLD, HTN, recent hospitalization for right sided hydronephrosis and is now s/p perc nephrostomy tube placement and exchange on 08/31/2017 after tube malfunction, presented with weakness, found to be hypotensive with presumptive sepsis. In ED, pt has received boluses of IV NS and was admitted for further evaluation. She has initially required vasopressor support and received significant IVF resuscitation. Pressors were weaned and diuresis started, slowly improving.    Assessment & Plan: Anasarca/acute on chronic diastolic CHF: Echocardiogram on 08/30/2017 shows grade 1 diastolic dysfunction. EF 60-65%. - UOP robust with lasix 40mg  IV BID, will continue this as creatinine and BP stable. Still with LE swelling. Dry weight should be close to 180lbs per family and rose to 248lbs. Was up >13L on 9/28. - weight trend below      Filed Weights   09/28/17 0440 09/29/17 0447 10/01/17 0513  Weight: 88 kg (194 lb) 87.8 kg (193 lb 9.6 oz) 82.1 kg (181 lb 1.6 oz)  - continue to monitor daily weights, I/O  Hypotension: suspected  distributive shock, third-spacing IVF's administered earlier this hospitalization. Has been off pressors since 09/14/2017 - No evidence of sepsis at this time. Blood cultures NGTD  - s/p 5 days abx.  - completed prednisone taper   Acute respiratory distress: Suspect acute diastolic CHF with pleural effusions and parainfluenza virus.  - Improved with thoracentesis 10/4, 580cc transudate - resp status stable this AM   Right-sided hydronephrosis: Thought to be due to extrinsic compression due to malignancy, now s/p replacement of perc nephrostomy tube 9/14. Output is good. - Discussed with urology, Dr. Gloriann Loan. Nephrostomy tube without clinical evidence of malfunction, so no indication to exchange at this time. Can remain in up to 3 months, so will have follow up with urology, Dr. Karsten Ro, as outpatient.  - Cause of hydronephrosis is being addressed   Hypokalemia and hypomagnesemia: - K and Mg WNL  DLBCL: Incidentally noted regression of lymphadenopathy on CT chest after 1st cycle of chemotherapy.  - Appreciate heme/onc assistance: chemo 10/10 & 10/11. S/P intrathecal MTX 10/9.   Severe protein calorie malnutrition in setting of morbid obesity: - Continue po supplementation. Hoping to improve lean body mass.   Anemia and pancytopenia: Suspect chemotx induced, improving s/p G-CSF. - Hg somewhat down overnight and Plt also down a bit - 1u PRBCs 10/8, hgb stable since.  - Per onc, should receive irradiated blood products. - transfused one U PRBC 10/15 and Plt appropriately up   Constipation: - had BM  Rheumatoid arthritis. - On methotrexate in the past currently not on any medication.  Obesity  - Body mass index is 37.85 kg/m.  DVT prophylaxis: SCD's Code Status:  Full Family Communication: family at bedside  Disposition Plan: d/c in am  Consultants:   Oncology  PCCM   Nephrology by phone 10/6.  Urology by phone 10/7  Procedures:   ECHO 9/13: EF 60% to 65%,  grade 1 diastolic dysfunction  Antimicrobials:   Vanc and Zosyn x5 days    Procedures/Studies: Ct Abdomen Pelvis Wo Contrast  Result Date: 09/02/2017 CLINICAL DATA:  Leukocytosis. History of dyspnea, appendectomy and partial hysterectomy. Ureteral obstruction with nephrostomy placed on the right. EXAM: CT CHEST, ABDOMEN AND PELVIS WITHOUT CONTRAST TECHNIQUE: Multidetector CT imaging of the chest, abdomen and pelvis was performed following the standard protocol without IV contrast. COMPARISON:  CXR 09/01/2017, CT abdomen pelvis 08/20/2017. 02/10/2010 MRI FINDINGS: CT CHEST FINDINGS Cardiovascular: Normal branch pattern of the great vessels with atherosclerosis identified within the right brachiocephalic and left subclavian artery origins. Mild aortic atherosclerosis at the arch without aneurysm. Dilatation of the main pulmonary artery up to 2.9 cm consistent with chronic pulmonary hypertension is noted. Heart is normal in size. There is minimal pericardial fluid or thickening anteriorly. Mediastinum/Nodes: Enlarged supraclavicular adenopathy as before, the largest on the left measuring 2 cm short axis with superior mediastinal adenopathy also noted, the largest is right paratracheal measuring 1.1 cm with prevascular lymph nodes measuring up to 1 cm short axis. Trachea and mainstem bronchi appear patent. No thyromegaly. Normal-appearing esophagus. Lungs/Pleura: New small bilateral pleural effusions with adjacent compressive atelectasis. Subpleural blebs are seen in the upper lobes right greater than left. Musculoskeletal: No chest wall mass or suspicious bone lesions identified. CT ABDOMEN PELVIS FINDINGS Hepatobiliary: A hypodense lesion with central calcification measuring 6.5 x 6.9 x 9.6 cm is again seen at the right hepatic dome with central calcification measuring 2.8 cm in diameter. Reportedly this was felt to represent a hemangioma on prior MRI from 2011. No biliary dilatation. No calcified  gallstones. Pancreas: Unremarkable. No pancreatic ductal dilatation or surrounding inflammatory changes. Spleen: The spleen is enlarged measuring 14.2 x 10.6 x 14.1 cm (volume = 1110 cm^3). No focal masses. Adrenals/Urinary Tract: Pain percutaneous nephrostomy tube is seen within the right renal collecting system with chronic perinephric fat stranding. No obstructive uropathy or nephrolithiasis is currently noted. Normal adrenal glands. Stomach/Bowel: Decompressed stomach with normal small bowel rotation. Enteric contrast is seen within distal ileum and large bowel to the level of the mid sigmoid colon. There is extensive descending and sigmoid diverticulosis without acute diverticulitis. Vascular/Lymphatic: Moderate aortoiliac atherosclerosis without aneurysm. Once again, retroperitoneal para-aortic and retrocaval lymphadenopathy is again seen, largest measuring up to 2 cm short axis to the left of the aorta and 1.8 cm short axis to the right. Reproductive: Calcified uterine fibroids are noted. No adnexal masses. Other: New moderate third spacing of fluid with indurated subcutaneous soft tissues of the lower chest, abdomen or pelvis. New small to moderate free fluid is seen within the lower abdomen and pelvis. Musculoskeletal: Grade 1 anterolisthesis of L4 and L5 attributable to facet arthropathy. There is lower lumbar degenerative facet arthropathy L3 through S1. No acute nor suspicious osseous abnormalities. IMPRESSION: 1. Supraclavicular, mediastinal, and retroperitoneal adenopathy is again noted consistent with lymphoma, metastasis or lymphoproliferative disorder. 2. Ill-defined hypodense right hepatic dome mass measuring 6.5 x 6.9 x 9.6 cm is again noted with central 2.8 cm calcification, noted to be behaving like a hemangioma on MRI from 2011. 3. Stable splenomegaly. 4. New third spacing of fluid in the subcutaneous soft tissues of the lower chest, abdomen and pelvis with small to moderate  amount of free  fluid in the pelvis and new bilateral pleural effusions with compressive atelectasis. 5. Calcified uterine fibroids. 6. Decompression of previously noted hydronephrosis on the right by percutaneous nephrostomy tube. No obstructing calculus in the distal right ureter. Given adenopathy, extrinsic compression from adenopathy may have contributed to the hydronephrosis and hydroureter. 7. Mild lower lumbar facet arthropathy. No acute nor suspicious osseous abnormalities. Electronically Signed   By: Ashley Royalty M.D.   On: 09/02/2017 23:59   Dg Chest 2 View  Result Date: 09/22/2017 CLINICAL DATA:  Shortness of breath and congestion. EXAM: CHEST  2 VIEW COMPARISON:  09/20/2017 FINDINGS: Port-A-Cath tip in the lower SVC. Hazy lung densities have not significantly changed. Small amount of pleural fluid based on the lateral view. Negative for pneumothorax. Heart size is normal. IMPRESSION: Persistent mild bilateral airspace disease with minimal change. Small amount of pleural fluid. Electronically Signed   By: Markus Daft M.D.   On: 09/22/2017 10:31   Ct Chest Wo Contrast  Result Date: 09/19/2017 CLINICAL DATA:  71 year old female with newly diagnosed diffuse large B-cell lymphoma in September 2018, currently undergoing chemotherapy. Cough and shortness of breath for the past 5 days. EXAM: CT CHEST WITHOUT CONTRAST TECHNIQUE: Multidetector CT imaging of the chest was performed following the standard protocol without IV contrast. COMPARISON:  CT of the chest, abdomen and pelvis 09/02/2017. FINDINGS: Cardiovascular: Heart size is normal. There is no significant pericardial fluid, thickening or pericardial calcification. Atherosclerosis in the thoracic aorta. Coronary artery calcification in the left anterior descending coronary artery. Right internal jugular single-lumen porta cath with tip terminating at the superior cavoatrial junction. Partial anomalous pulmonary venous return from the left upper lobe to the innominate  vein incidentally noted (normal anatomical variant). Mediastinum/Nodes: No pathologically enlarged mediastinal or hilar lymph nodes. Please note that accurate exclusion of hilar adenopathy is limited on noncontrast CT scans. Several densely calcified mediastinal lymph nodes are incidentally noted. Esophagus is unremarkable in appearance. No axillary lymphadenopathy. Lungs/Pleura: Worsening moderate bilateral pleural effusions lying dependently with areas of subsegmental atelectasis in the lower lobes of the lungs bilaterally (left greater than right). Patchy multifocal ground-glass attenuation and interlobular septal thickening noted throughout the mid to upper lungs bilaterally. Mild diffuse bronchial wall thickening with mild centrilobular and paraseptal emphysema. Upper Abdomen: Large partially calcified low attenuation lesion in the dome of the right lobe of the liver, similar to numerous prior examinations, incompletely characterized on today's noncontrast CT examination, but likely to represent a large cavernous hemangioma. Large volume of ascites incompletely visualized. Musculoskeletal: Diffuse body wall edema. There are no aggressive appearing lytic or blastic lesions noted in the visualized portions of the skeleton. IMPRESSION: 1. Regression of previously noted lymphadenopathy indicative of a positive response to therapy. 2. New patchy areas of mid to upper lung predominant ground-glass attenuation and interlobular septal thickening. This is nonspecific, but given the normal cardiac size, this does not appear to reflect cardiogenic pulmonary edema. Noncardiogenic edema, alveolar hemorrhage and/or acute atypical infection are the primary differential considerations. 3. Increasing moderate bilateral pleural effusions, large volume of ascites, and diffuse body wall edema, suggestive of a state of underlying anasarca. 4. Aortic atherosclerosis, in addition to left anterior descending coronary artery disease.  Assessment for potential risk factor modification, dietary therapy or pharmacologic therapy may be warranted, if clinically indicated. 5. Mild diffuse bronchial wall thickening with mild centrilobular and paraseptal emphysema; imaging findings suggestive of underlying COPD. 6. Additional incidental findings, as above. Aortic Atherosclerosis (ICD10-I70.0) and Emphysema (  ICD10-J43.9). Electronically Signed   By: Vinnie Langton M.D.   On: 09/19/2017 15:20   Ct Chest Wo Contrast  Result Date: 09/02/2017 CLINICAL DATA:  Leukocytosis. History of dyspnea, appendectomy and partial hysterectomy. Ureteral obstruction with nephrostomy placed on the right. EXAM: CT CHEST, ABDOMEN AND PELVIS WITHOUT CONTRAST TECHNIQUE: Multidetector CT imaging of the chest, abdomen and pelvis was performed following the standard protocol without IV contrast. COMPARISON:  CXR 09/01/2017, CT abdomen pelvis 08/20/2017. 02/10/2010 MRI FINDINGS: CT CHEST FINDINGS Cardiovascular: Normal branch pattern of the great vessels with atherosclerosis identified within the right brachiocephalic and left subclavian artery origins. Mild aortic atherosclerosis at the arch without aneurysm. Dilatation of the main pulmonary artery up to 2.9 cm consistent with chronic pulmonary hypertension is noted. Heart is normal in size. There is minimal pericardial fluid or thickening anteriorly. Mediastinum/Nodes: Enlarged supraclavicular adenopathy as before, the largest on the left measuring 2 cm short axis with superior mediastinal adenopathy also noted, the largest is right paratracheal measuring 1.1 cm with prevascular lymph nodes measuring up to 1 cm short axis. Trachea and mainstem bronchi appear patent. No thyromegaly. Normal-appearing esophagus. Lungs/Pleura: New small bilateral pleural effusions with adjacent compressive atelectasis. Subpleural blebs are seen in the upper lobes right greater than left. Musculoskeletal: No chest wall mass or suspicious bone  lesions identified. CT ABDOMEN PELVIS FINDINGS Hepatobiliary: A hypodense lesion with central calcification measuring 6.5 x 6.9 x 9.6 cm is again seen at the right hepatic dome with central calcification measuring 2.8 cm in diameter. Reportedly this was felt to represent a hemangioma on prior MRI from 2011. No biliary dilatation. No calcified gallstones. Pancreas: Unremarkable. No pancreatic ductal dilatation or surrounding inflammatory changes. Spleen: The spleen is enlarged measuring 14.2 x 10.6 x 14.1 cm (volume = 1110 cm^3). No focal masses. Adrenals/Urinary Tract: Pain percutaneous nephrostomy tube is seen within the right renal collecting system with chronic perinephric fat stranding. No obstructive uropathy or nephrolithiasis is currently noted. Normal adrenal glands. Stomach/Bowel: Decompressed stomach with normal small bowel rotation. Enteric contrast is seen within distal ileum and large bowel to the level of the mid sigmoid colon. There is extensive descending and sigmoid diverticulosis without acute diverticulitis. Vascular/Lymphatic: Moderate aortoiliac atherosclerosis without aneurysm. Once again, retroperitoneal para-aortic and retrocaval lymphadenopathy is again seen, largest measuring up to 2 cm short axis to the left of the aorta and 1.8 cm short axis to the right. Reproductive: Calcified uterine fibroids are noted. No adnexal masses. Other: New moderate third spacing of fluid with indurated subcutaneous soft tissues of the lower chest, abdomen or pelvis. New small to moderate free fluid is seen within the lower abdomen and pelvis. Musculoskeletal: Grade 1 anterolisthesis of L4 and L5 attributable to facet arthropathy. There is lower lumbar degenerative facet arthropathy L3 through S1. No acute nor suspicious osseous abnormalities. IMPRESSION: 1. Supraclavicular, mediastinal, and retroperitoneal adenopathy is again noted consistent with lymphoma, metastasis or lymphoproliferative disorder. 2.  Ill-defined hypodense right hepatic dome mass measuring 6.5 x 6.9 x 9.6 cm is again noted with central 2.8 cm calcification, noted to be behaving like a hemangioma on MRI from 2011. 3. Stable splenomegaly. 4. New third spacing of fluid in the subcutaneous soft tissues of the lower chest, abdomen and pelvis with small to moderate amount of free fluid in the pelvis and new bilateral pleural effusions with compressive atelectasis. 5. Calcified uterine fibroids. 6. Decompression of previously noted hydronephrosis on the right by percutaneous nephrostomy tube. No obstructing calculus in the distal right ureter. Given  adenopathy, extrinsic compression from adenopathy may have contributed to the hydronephrosis and hydroureter. 7. Mild lower lumbar facet arthropathy. No acute nor suspicious osseous abnormalities. Electronically Signed   By: Ashley Royalty M.D.   On: 09/02/2017 23:59   Ir US Guide Vasc Access Right  Result Date: 09/10/2017 CLINICAL DATA:  Lymphoma, needs durable venous access for planned chemotherapy regimen. EXAM: TUNNELED PORT CATHETER PLACEMENT WITH ULTRASOUND AND FLUOROSCOPIC GUIDANCE FLUOROSCOPY TIME:  6 seconds, 0.6 mGy ANESTHESIA/SEDATION: Intravenous Fentanyl and Versed were administered as conscious sedation during continuous monitoring of the patient's level of consciousness and physiological / cardiorespiratory status by the radiology RN, with a total moderate sedation time of 13 minutes. TECHNIQUE: The procedure, risks, benefits, and alternatives were explained to the patient. Questions regarding the procedure were encouraged and answered. The patient understands and consents to the procedure. As antibiotic prophylaxis, cefazolin 2 g K Dr. has was ordered pre-procedure and administered intravenously within one hour of incision. Patency of the right IJ vein was confirmed with ultrasound with image documentation. An appropriate skin site was determined. Skin site was marked. Region was prepped  using maximum barrier technique including cap and mask, sterile gown, sterile gloves, large sterile sheet, and Chlorhexidine as cutaneous antisepsis. The region was infiltrated locally with 1% lidocaine. Under real-time ultrasound guidance, the right IJ vein was accessed with a 21 gauge micropuncture needle; the needle tip within the vein was confirmed with ultrasound image documentation. Needle was exchanged over a 018 guidewire for transitional dilator which allowed passage of the [facility] wire into the IVC. Over this, the transitional dilator was exchanged for a 5 [place] MPA catheter. A small incision was made on the right anterior chest wall and a subcutaneous pocket fashioned. The power-injectable port was positioned and its catheter tunneled to the right IJ dermatotomy site. The MPA catheter was exchanged over an Amplatz wire for a peel-away sheath, through which the port catheter, which had been trimmed to the appropriate length, was advanced and positioned under fluoroscopy with its tip at the cavoatrial junction. Spot chest radiograph confirms good catheter position and no pneumothorax. The pocket was closed with deep interrupted and subcuticular continuous 3-0 Monocryl sutures. The port was flushed per protocol. The incisions were covered with Dermabond then covered with a sterile dressing. COMPLICATIONS: COMPLICATIONS None immediate IMPRESSION: Technically successful right IJ power-injectable port catheter placement. Ready for routine use. Electronically Signed   By: Lucrezia Europe M.D.   On: 09/07/2017 08:59   Dg Chest Port 1 View  Result Date: 09/20/2017 CLINICAL DATA:  Status post left thoracentesis EXAM: PORTABLE CHEST 1 VIEW COMPARISON:  09/18/2017 FINDINGS: There is no evidence of pneumothorax after left thoracentesis. Left pleural effusion has improved. Bibasilar atelectasis. Stable right jugular Port-A-Cath. Upper normal heart size. IMPRESSION: No pneumothorax post left thoracentesis. Electronically  Signed   By: Marybelle Killings M.D.   On: 09/20/2017 14:52   Dg Chest Port 1 View  Result Date: 09/18/2017 CLINICAL DATA:  Shortness of breath. EXAM: PORTABLE CHEST 1 VIEW COMPARISON:  09/10/2017 FINDINGS: Power port appears in good position. Tip is approximately 2 cm below the carina in good position. Since the prior study the patient has developed bilateral pleural effusions, left greater than right. Pulmonary vascularity appears normal. Heart size is normal. No acute bone abnormality. IMPRESSION: New bilateral pleural effusions. Small on the right and moderate on the left. Electronically Signed   By: Lorriane Shire M.D.   On: 09/18/2017 08:51   Dg Chest [facility]  Result Date: 09/10/2017 CLINICAL DATA:  Hypotension EXAM: PORTABLE CHEST 1 VIEW COMPARISON:  09/01/2017, CT chest 09/02/2017 FINDINGS: Interval placement of a right-sidedcentral venous port with tip overlying the mid SVC. Hazy bibasilar atelectasis or infiltrate at the bases. Stable cardiomediastinal silhouette. No pneumothorax. IMPRESSION: No significant interval change in left greater than right hazy bibasilar atelectasis or infiltrates. Electronically Signed   By: Donavan Foil M.D.   On: 09/10/2017 23:32   Dg Abd Portable 1v  Result Date: 09/15/2017 CLINICAL DATA:  Constipation for 3 days EXAM: PORTABLE ABDOMEN - 1 VIEW COMPARISON:  09/02/2017 CT and prior studies FINDINGS: Nondistended gas-filled loops of small bowel are noted. A moderate amount of stool in the ascending colon and rectum noted. A right percutaneous nephrostomy catheter is again noted. No pneumoperitoneum. IMPRESSION: Nonspecific nonobstructive bowel gas pattern. Moderate stool in the ascending colon and rectum. Electronically Signed   By: Margarette Canada M.D.   On: 09/15/2017 10:14   Ir Nephrostomy Exchange Right  Result Date: 09/11/2017 INDICATION: History of diffuse large B-cell lymphoma with right-sided hydro nephrosis secondary to bulky retroperitoneal lymphadenopathy,  post ultrasound fluoroscopic guided nephrostomy catheter placement on 08/21/2017 and subsequent nephrostomy catheter exchange on 08/31/2017. The external portion of nephrostomy catheter has been fractured, and as such, request has been made for fluoroscopic guided exchange. EXAM: FLUOROSCOPIC GUIDED RIGHT SIDED NEPHROSTOMY CATHETER EXCHANGE COMPARISON:  CT abdomen and pelvis - 08/20/2017 ; 08/31/2017; ultrasound fluoroscopic guided right-sided percutaneous nephrostomy catheter placement - 08/21/2017; fluoroscopic guided right-sided nephrostomy catheter exchange - 08/31/2017 CONTRAST:  10 mL [id number]-300 administered into the collecting system FLUOROSCOPY TIME:  54 seconds (85.2 mGy) COMPLICATIONS: None immediate. TECHNIQUE: Informed written consent was obtained from the patient after a discussion of the risks, benefits and alternatives to treatment. Questions regarding the procedure were encouraged and answered. A timeout was performed prior to the initiation of the procedure. The right flank and external portion of existing nephrostomy catheter were prepped and draped in the usual sterile fashion. A sterile drape was applied covering the operative field. Maximum barrier sterile technique with sterile gowns and gloves were used for the procedure. A timeout was performed prior to the initiation of the procedure. A pre procedural spot fluoroscopic image was obtained after contrast was injected via the existing nephrostomy catheter demonstrating appropriate positioning within the renal pelvis. The existing nephrostomy catheter was cut and cannulated with a short Amplatz wire which was coiled within the renal pelvis. Under intermittent fluoroscopic guidance, the existing nephrostomy catheter was exchanged for a new 10.2 [place] all-purpose drainage catheter. Contrast injection confirmed appropriate positioning within the renal pelvis and a post exchange fluoroscopic image was obtained. The catheter was locked and secured  to the skin with a suture. A dressing was placed. The patient tolerated the procedure well without immediate postprocedural complication. FINDINGS: The existing nephrostomy catheter is appropriately positioned. After successful fluoroscopic guided exchange, the new nephrostomy catheter is coiled and locked within the right renal pelvis. Re- demonstrated marked dysmorphic appearance of the right renal collecting system. IMPRESSION: Successful fluoroscopic guided exchange of right sided 10.2 French percutaneous nephrostomy catheter. Electronically Signed   By: Sandi Mariscal M.D.   On: 09/11/2017 12:20   Ir Fluoro Guide Port Insertion Right  Result Date: 09/07/2017 CLINICAL DATA:  Lymphoma, needs durable venous access for planned chemotherapy regimen. EXAM: TUNNELED PORT CATHETER PLACEMENT WITH ULTRASOUND AND FLUOROSCOPIC GUIDANCE FLUOROSCOPY TIME:  6 seconds, 0.6 mGy ANESTHESIA/SEDATION: Intravenous Fentanyl and Versed were administered as conscious sedation during continuous monitoring of the  patient's level of consciousness and physiological / cardiorespiratory status by the radiology RN, with a total moderate sedation time of 13 minutes. TECHNIQUE: The procedure, risks, benefits, and alternatives were explained to the patient. Questions regarding the procedure were encouraged and answered. The patient understands and consents to the procedure. As antibiotic prophylaxis, cefazolin 2 g K Dr. has was ordered pre-procedure and administered intravenously within one hour of incision. Patency of the right IJ vein was confirmed with ultrasound with image documentation. An appropriate skin site was determined. Skin site was marked. Region was prepped using maximum barrier technique including cap and mask, sterile gown, sterile gloves, large sterile sheet, and Chlorhexidine as cutaneous antisepsis. The region was infiltrated locally with 1% lidocaine. Under real-time ultrasound guidance, the right IJ vein was accessed with  a 21 gauge micropuncture needle; the needle tip within the vein was confirmed with ultrasound image documentation. Needle was exchanged over a 018 guidewire for transitional dilator which allowed passage of the [facility] wire into the IVC. Over this, the transitional dilator was exchanged for a 5 [place] MPA catheter. A small incision was made on the right anterior chest wall and a subcutaneous pocket fashioned. The power-injectable port was positioned and its catheter tunneled to the right IJ dermatotomy site. The MPA catheter was exchanged over an Amplatz wire for a peel-away sheath, through which the port catheter, which had been trimmed to the appropriate length, was advanced and positioned under fluoroscopy with its tip at the cavoatrial junction. Spot chest radiograph confirms good catheter position and no pneumothorax. The pocket was closed with deep interrupted and subcuticular continuous 3-0 Monocryl sutures. The port was flushed per protocol. The incisions were covered with Dermabond then covered with a sterile dressing. COMPLICATIONS: COMPLICATIONS None immediate IMPRESSION: Technically successful right IJ power-injectable port catheter placement. Ready for routine use. Electronically Signed   By: Lucrezia Europe M.D.   On: 09/07/2017 08:59   US Thoracentesis Asp Pleural Space W/img Guide  Result Date: 09/20/2017 INDICATION: Patient with history of diffuse large B-cell lymphoma, dyspnea, bilateral pleural effusions left greater than right. Request made for diagnostic and therapeutic left thoracentesis. EXAM: ULTRASOUND GUIDED DIAGNOSTIC AND THERAPEUTIC LEFT THORACENTESIS MEDICATIONS: None. COMPLICATIONS: None immediate. PROCEDURE: An ultrasound guided thoracentesis was thoroughly discussed with the patient and questions answered. The benefits, risks, alternatives and complications were also discussed. The patient understands and wishes to proceed with the procedure. Written consent was obtained. Ultrasound was  performed to localize and mark an adequate pocket of fluid in the left chest. The area was then prepped and draped in the normal sterile fashion. 2% Lidocaine was used for local anesthesia. Under ultrasound guidance a Safe-T-Centesis catheter was introduced. Thoracentesis was performed. The catheter was removed and a dressing applied. FINDINGS: A total of approximately 580 cc of light yellow fluid was removed. Samples were sent to the laboratory as requested by the clinical team. IMPRESSION: Successful ultrasound guided diagnostic and therapeutic left thoracentesis yielding 580 cc of pleural fluid. Read by: Rowe Robert, PA-C Electronically Signed   By: Markus Daft M.D.   On: 09/20/2017 12:59   Dg Fluoro Guide Lumbar Puncture  Result Date: 09/25/2017 CLINICAL DATA:  71 year old female with lymphoma. Subsequent encounter. EXAM: DIAGNOSTIC LUMBAR PUNCTURE UNDER FLUOROSCOPIC GUIDANCE FLUOROSCOPY TIME:  Fluoroscopy Time:  40 second Radiation Exposure Index 16.4 mGy PROCEDURE: Case discussed with Dr. Burr Medico.  Labs and imaging checked. Informed consent was obtained from the patient prior to the procedure, including potential complications of headache, infection, bleeding or  drug reaction. Time-out performed. With the patient prone, the lower back was prepped with Betadine. 1% Lidocaine was used for local anesthesia. Lumbar puncture was performed at the L3-4 level with a single pass of a 22 gauge needle with return of 5 CSF sent for labs as per request. Pharmacy prepared and 12 mg methotrexate with 50 mg hydrocortisone within 5 cc of fluid was slowly instilled. Postprocedure instructions reviewed with the patient. The patient tolerated the procedure well and there were no apparent complications. IMPRESSION: L3-4 lumbar puncture with installation of chemotherapy. No immediate complication. Electronically Signed   By: Genia Del M.D.   On: 09/25/2017 12:30     Discharge Exam: Vitals:   10/02/17 0626 10/02/17 1000   BP: 112/63 102/77  Pulse: 87 100  Resp: 16 (!) 22  Temp: 98 F (36.7 C) 98.1 F (36.7 C)  SpO2: 98% 100%   Vitals:   10/01/17 2122 10/02/17 0626 10/02/17 0819 10/02/17 1000  BP: (!) 108/56 112/63  102/77  Pulse: 87 87  100  Resp: 16 16  (!) 22  Temp: 98 F (36.7 C) 98 F (36.7 C)  98.1 F (36.7 C)  TempSrc: Oral Oral  Oral  SpO2: 100% 98%  100%  Weight:   81.1 kg (178 lb 12.8 oz)   Height:        General: Pt is alert, follows commands appropriately, not in acute distress Cardiovascular: Regular rate and rhythm, S1/S2 +, no murmurs, no rubs, no gallops Respiratory: Clear to auscultation bilaterally, no wheezing, no crackles, no rhonchi Abdominal: Soft, non tender, non distended, bowel sounds +, no guarding Extremities: +1 bilateral LE edema   Discharge Instructions    Follow-up Information    [name], MD Follow up.   Specialty:  Internal Medicine Contact information: [address] [place] [address] [phone]          Allergies as of 10/02/2017      Reactions   Streptomycin Anaphylaxis      Medication List    TAKE these medications   acetaminophen 325 MG tablet Commonly known as:  TYLENOL Take 2 tablets (650 mg total) by mouth every 6 (six) hours as needed for mild pain (or Fever >/= 101).   albuterol (2.5 MG/3ML) 0.083% nebulizer solution Commonly known as:  PROVENTIL Take 3 mLs (2.5 mg total) by nebulization every 4 (four) hours as needed for wheezing or shortness of breath.   allopurinol 300 MG tablet Commonly known as:  ZYLOPRIM Take 300 mg by mouth daily.   benzonatate 100 MG capsule Commonly known as:  TESSALON Take 1 capsule (100 mg total) by mouth 3 (three) times daily.   dextromethorphan-guaiFENesin 30-600 MG 12hr tablet Commonly known as:  MUCINEX DM Take 1 tablet by mouth 2 (two) times daily as needed for cough.   diphenoxylate-atropine 2.5-0.025 MG tablet Commonly known as:  LOMOTIL Take 1 tablet by mouth 4 (four)  times daily as needed for diarrhea or loose stools.   furosemide 20 MG tablet Commonly known as:  LASIX Take 2 tablets (40 mg total) by mouth 2 (two) times daily. What changed:  how much to take  when to take this   ondansetron 4 MG tablet Commonly known as:  ZOFRAN Take 4 mg by mouth. Take one tablet every 6 hours as needed   pantoprazole 40 MG tablet Commonly known as:  PROTONIX Take 1 tablet (40 mg total) by mouth daily.   simvastatin 20 MG tablet Commonly known as:  ZOCOR Take 20 mg by mouth daily.   traMADol 50 MG tablet Commonly known as:  ULTRAM Take 1 tablet (50 mg total) by mouth every 6 (six) hours as needed for moderate pain or severe pain.        The results of significant diagnostics from this hospitalization (including imaging, microbiology, ancillary and laboratory) are listed below for reference.     Microbiology: No results found for this or any previous visit (from the past 240 hour(s)).   Labs: Basic Metabolic Panel:  Recent Labs Lab 09/27/17 0500 09/28/17 0529 09/29/17 0453 09/30/17 0432 10/01/17 0459 10/02/17 0558  NA 139 139 140 139 139 139  K 3.8 4.0 4.2 4.0 4.1 3.9  CL 104 102 106 104 103 101  CO2 30 28 28 28 28 29   GLUCOSE 99 116* 109* 100* 97 89  BUN 30* 39* 46* 47* 43* 38*  CREATININE 0.61 0.69 0.55 0.56 0.53 0.47  CALCIUM 7.4* 7.3* 7.4* 7.7* 7.9* 8.3*  MG 1.6* 2.0 1.9  --  1.9  --    CBC:  Recent Labs Lab 09/28/17 0529 09/29/17 0453 09/30/17 0432 10/01/17 0459 10/01/17 2230 10/02/17 0558  WBC 6.9 5.1 4.9 4.6  --  3.4*  HGB 8.6* 8.0* 7.8* 7.6* 9.3* 9.4*  HCT 25.8* 24.2* 23.2* 22.5* 27.1* 27.6*  MCV 94.2 93.8 94.3 93.8  --  93.2  PLT 112* 105* 87* 63*  --  50*   BNP (last 3 results)  Recent Labs  08/31/17 1430 09/10/17 2309  BNP 21.1 34.5   SIGNED: Time coordinating discharge: 60 minutes  Faye Ramsay, MD  [facility] 10/02/2017, 11:08 AM Pager [phone]  If 7PM-7AM, please contact  night-coverage [email or web address] Password [email or web address]

## 2017-10-02 NOTE — Discharge Instructions (Signed)

## 2017-10-02 NOTE — Progress Notes (Signed)
Elianys Conry   DOB:04-23-1946   HG#:[id number]   HDQ#:[id number]  Subjective: Ms. Eads received IT MTX on 10/9 and cycle 2 R-CHOP 10/10. She tolerated this well, she denies nausea or pain. She sees small amount of blood when blowing her nose. She received 1 unit RBC yesterday and feels well overall. She is up in bed with a visitor when I saw her, plans to be discharged tomorrow.    Objective:  Vitals:   10/02/17 1300 10/02/17 1551  BP: 114/62 108/62  Pulse: 95 95  Resp: (!) 22 (!) 22  Temp: 97.6 F (36.4 C) 97.8 F (36.6 C)  SpO2: 100% 100%    Body mass index is 37.37 kg/m.  Intake/Output Summary (Last 24 hours) at 10/02/17 1622 Last data filed at 10/02/17 1552  Gross per 24 hour  Intake             1115 ml  Output             5780 ml  Net            -4665 ml     Oropharynx clear  Lungs clear -- no rales or rhonchi  Heart regular rate and rhythm  Abdomen soft, nontender, normal BS  MSK bilateral peripheral edema  Neuro nonfocal  CBG (last 3)  No results for input(s): GLUCAP in the last 72 hours.   Labs:  Lab Results  Component Value Date   WBC 3.4 (L) 10/02/2017   HGB 9.4 (L) 10/02/2017   HCT 27.6 (L) 10/02/2017   MCV 93.2 10/02/2017   PLT 50 (L) 10/02/2017   NEUTROABS 2.9 10/02/2017     Urine Studies No results for input(s): UHGB, CRYS in the last 72 hours.  Invalid input(s): UACOL, UAPR, USPG, UPH, UTP, UGL, UKET, UBIL, UNIT, UROB, [place], UEPI, UWBC, [place], [place], [place], [place], [place]  Basic Metabolic Panel:  Recent Labs Lab 09/27/17 0500 09/28/17 0529 09/29/17 0453 09/30/17 0432 10/01/17 0459 10/02/17 0558  NA 139 139 140 139 139 139  K 3.8 4.0 4.2 4.0 4.1 3.9  CL 104 102 106 104 103 101  CO2 30 28 28 28 28 29   GLUCOSE 99 116* 109* 100* 97 89  BUN 30* 39* 46* 47* 43* 38*  CREATININE 0.61 0.69 0.55 0.56 0.53 0.47  CALCIUM 7.4* 7.3* 7.4* 7.7* 7.9* 8.3*  MG 1.6* 2.0 1.9  --  1.9  --    GFR Estimated Creatinine Clearance: 60.8 mL/min (by C-G  formula based on SCr of 0.47 mg/dL). Liver Function Tests: No results for input(s): AST, ALT, ALKPHOS, BILITOT, PROT, ALBUMIN in the last 168 hours. No results for input(s): LIPASE, AMYLASE in the last 168 hours. No results for input(s): AMMONIA in the last 168 hours. Coagulation profile No results for input(s): INR, PROTIME in the last 168 hours.  CBC:  Recent Labs Lab 09/28/17 0529 09/29/17 0453 09/30/17 0432 10/01/17 0459 10/01/17 2230 10/02/17 0558  WBC 6.9 5.1 4.9 4.6  --  3.4*  NEUTROABS  --   --   --   --   --  2.9  HGB 8.6* 8.0* 7.8* 7.6* 9.3* 9.4*  HCT 25.8* 24.2* 23.2* 22.5* 27.1* 27.6*  MCV 94.2 93.8 94.3 93.8  --  93.2  PLT 112* 105* 87* 63*  --  50*   Cardiac Enzymes: No results for input(s): CKTOTAL, CKMB, CKMBINDEX, TROPONINI in the last 168 hours. BNP: Invalid input(s): POCBNP CBG: No results for input(s): GLUCAP in the last 168 hours. D-Dimer No  results for input(s): DDIMER in the last 72 hours. Hgb A1c No results for input(s): HGBA1C in the last 72 hours. Lipid Profile No results for input(s): CHOL, HDL, LDLCALC, TRIG, CHOLHDL, LDLDIRECT in the last 72 hours. Thyroid function studies No results for input(s): TSH, T4TOTAL, T3FREE, THYROIDAB in the last 72 hours.  Invalid input(s): FREET3 Anemia work up No results for input(s): VITAMINB12, FOLATE, FERRITIN, TIBC, IRON, RETICCTPCT in the last 72 hours. Microbiology No results found for this or any previous visit (from the past 240 hour(s)).    Studies:  No results found.  Assessment: 71 y.o. female with a history of hypertension, gout, hyperlipidemia and rheumatoid arthritis on methotrexate, presented with severe right hydronephrosis, fatigue, nausea and weakness.  1. Diffuse large B cell lymphoma, with bone marrow involvement, stage IV, IPS 4, high risk, s/p first cycle chemo R-CVP on 09/06/2017, second cycle R-CHOP with dose reduction on 09/26/17 2. Pancytopenia, secondary to #2 and chemo  3.  Severe anasarca and hypoalbuminemia  4. RA, MTX on hold for now  5. HTN 6. Right hydronephrosis secondary to ureteral obstruction, status post nephrostomy tube placement 7. History of severe hypercalcemia, resolved  8. Nausea and vomiting, anorexia  9. Severe calorie and protein malnutrition  10 dyspnea, bilateral pleural effusions, improved after thoracentesis on 10/4 and diuretics   Plan:  -I have placed order to add differential to today's CBC to assess ANC -Per review with Dr. Benay Spice, I placed order for granix 300 mcg to be given today and 10/17 prior to discharge, I have requested [facility] hospital to administer subsequent doses on 10/18 and 10/19 with lab check 10/19; that request is pending.  -I spoke to Dr. Hinton Rao at [facility] this morning who will see her for lab and f/u in the next week.  -I encouraged her not to blow her nose while platelet count is low -She will be discharged to [facility]   Alla Feeling, NP 10/02/2017  4:22 PM

## 2017-10-03 ENCOUNTER — Encounter: Payer: Self-pay | Admitting: Nurse Practitioner

## 2017-10-03 DIAGNOSIS — N179 Acute kidney failure, unspecified: Secondary | ICD-10-CM | POA: Diagnosis not present

## 2017-10-03 DIAGNOSIS — Z658 Other specified problems related to psychosocial circumstances: Secondary | ICD-10-CM | POA: Diagnosis not present

## 2017-10-03 DIAGNOSIS — N131 Hydronephrosis with ureteral stricture, not elsewhere classified: Secondary | ICD-10-CM | POA: Diagnosis not present

## 2017-10-03 DIAGNOSIS — Z436 Encounter for attention to other artificial openings of urinary tract: Secondary | ICD-10-CM | POA: Diagnosis not present

## 2017-10-03 DIAGNOSIS — E785 Hyperlipidemia, unspecified: Secondary | ICD-10-CM | POA: Diagnosis not present

## 2017-10-03 DIAGNOSIS — J9382 Other air leak: Secondary | ICD-10-CM | POA: Diagnosis not present

## 2017-10-03 DIAGNOSIS — Z8579 Personal history of other malignant neoplasms of lymphoid, hematopoietic and related tissues: Secondary | ICD-10-CM | POA: Diagnosis not present

## 2017-10-03 DIAGNOSIS — J9601 Acute respiratory failure with hypoxia: Secondary | ICD-10-CM | POA: Diagnosis not present

## 2017-10-03 DIAGNOSIS — J9600 Acute respiratory failure, unspecified whether with hypoxia or hypercapnia: Secondary | ICD-10-CM | POA: Diagnosis not present

## 2017-10-03 DIAGNOSIS — D61818 Other pancytopenia: Secondary | ICD-10-CM | POA: Diagnosis not present

## 2017-10-03 DIAGNOSIS — C801 Malignant (primary) neoplasm, unspecified: Secondary | ICD-10-CM | POA: Diagnosis not present

## 2017-10-03 DIAGNOSIS — J80 Acute respiratory distress syndrome: Secondary | ICD-10-CM | POA: Diagnosis not present

## 2017-10-03 DIAGNOSIS — M069 Rheumatoid arthritis, unspecified: Secondary | ICD-10-CM | POA: Diagnosis not present

## 2017-10-03 DIAGNOSIS — T83092A Other mechanical complication of nephrostomy catheter, initial encounter: Secondary | ICD-10-CM | POA: Diagnosis not present

## 2017-10-03 DIAGNOSIS — E876 Hypokalemia: Secondary | ICD-10-CM | POA: Diagnosis not present

## 2017-10-03 DIAGNOSIS — C8330 Diffuse large B-cell lymphoma, unspecified site: Secondary | ICD-10-CM | POA: Diagnosis not present

## 2017-10-03 DIAGNOSIS — N1339 Other hydronephrosis: Secondary | ICD-10-CM | POA: Diagnosis not present

## 2017-10-03 DIAGNOSIS — C8590 Non-Hodgkin lymphoma, unspecified, unspecified site: Secondary | ICD-10-CM | POA: Diagnosis not present

## 2017-10-03 DIAGNOSIS — D6481 Anemia due to antineoplastic chemotherapy: Secondary | ICD-10-CM | POA: Diagnosis not present

## 2017-10-03 DIAGNOSIS — Z466 Encounter for fitting and adjustment of urinary device: Secondary | ICD-10-CM | POA: Diagnosis not present

## 2017-10-03 DIAGNOSIS — Z23 Encounter for immunization: Secondary | ICD-10-CM | POA: Diagnosis not present

## 2017-10-03 DIAGNOSIS — R6520 Severe sepsis without septic shock: Secondary | ICD-10-CM | POA: Diagnosis not present

## 2017-10-03 DIAGNOSIS — R262 Difficulty in walking, not elsewhere classified: Secondary | ICD-10-CM | POA: Diagnosis not present

## 2017-10-03 DIAGNOSIS — C8338 Diffuse large B-cell lymphoma, lymph nodes of multiple sites: Secondary | ICD-10-CM | POA: Diagnosis not present

## 2017-10-03 DIAGNOSIS — I361 Nonrheumatic tricuspid (valve) insufficiency: Secondary | ICD-10-CM | POA: Diagnosis not present

## 2017-10-03 DIAGNOSIS — Z936 Other artificial openings of urinary tract status: Secondary | ICD-10-CM | POA: Diagnosis not present

## 2017-10-03 DIAGNOSIS — E46 Unspecified protein-calorie malnutrition: Secondary | ICD-10-CM | POA: Diagnosis not present

## 2017-10-03 DIAGNOSIS — Z741 Need for assistance with personal care: Secondary | ICD-10-CM | POA: Diagnosis not present

## 2017-10-03 DIAGNOSIS — C8339 Diffuse large B-cell lymphoma, extranodal and solid organ sites: Secondary | ICD-10-CM | POA: Diagnosis not present

## 2017-10-03 DIAGNOSIS — T83022D Displacement of nephrostomy catheter, subsequent encounter: Secondary | ICD-10-CM | POA: Diagnosis not present

## 2017-10-03 DIAGNOSIS — M6281 Muscle weakness (generalized): Secondary | ICD-10-CM | POA: Diagnosis not present

## 2017-10-03 DIAGNOSIS — N390 Urinary tract infection, site not specified: Secondary | ICD-10-CM | POA: Diagnosis not present

## 2017-10-03 DIAGNOSIS — Z5189 Encounter for other specified aftercare: Secondary | ICD-10-CM | POA: Diagnosis not present

## 2017-10-03 DIAGNOSIS — Z5111 Encounter for antineoplastic chemotherapy: Secondary | ICD-10-CM | POA: Diagnosis not present

## 2017-10-03 DIAGNOSIS — I959 Hypotension, unspecified: Secondary | ICD-10-CM | POA: Diagnosis not present

## 2017-10-03 DIAGNOSIS — Y731 Therapeutic (nonsurgical) and rehabilitative gastroenterology and urology devices associated with adverse incidents: Secondary | ICD-10-CM | POA: Diagnosis not present

## 2017-10-03 DIAGNOSIS — D649 Anemia, unspecified: Secondary | ICD-10-CM | POA: Diagnosis not present

## 2017-10-03 DIAGNOSIS — N119 Chronic tubulo-interstitial nephritis, unspecified: Secondary | ICD-10-CM | POA: Diagnosis not present

## 2017-10-03 DIAGNOSIS — Z87891 Personal history of nicotine dependence: Secondary | ICD-10-CM | POA: Diagnosis not present

## 2017-10-03 DIAGNOSIS — K5900 Constipation, unspecified: Secondary | ICD-10-CM | POA: Diagnosis not present

## 2017-10-03 DIAGNOSIS — R0602 Shortness of breath: Secondary | ICD-10-CM | POA: Diagnosis not present

## 2017-10-03 DIAGNOSIS — F4322 Adjustment disorder with anxiety: Secondary | ICD-10-CM | POA: Diagnosis not present

## 2017-10-03 DIAGNOSIS — Z7409 Other reduced mobility: Secondary | ICD-10-CM | POA: Diagnosis not present

## 2017-10-03 DIAGNOSIS — N289 Disorder of kidney and ureter, unspecified: Secondary | ICD-10-CM | POA: Diagnosis not present

## 2017-10-03 DIAGNOSIS — N139 Obstructive and reflux uropathy, unspecified: Secondary | ICD-10-CM | POA: Diagnosis not present

## 2017-10-03 DIAGNOSIS — M109 Gout, unspecified: Secondary | ICD-10-CM | POA: Diagnosis not present

## 2017-10-03 DIAGNOSIS — Z8572 Personal history of non-Hodgkin lymphomas: Secondary | ICD-10-CM | POA: Diagnosis not present

## 2017-10-03 DIAGNOSIS — Z9889 Other specified postprocedural states: Secondary | ICD-10-CM | POA: Diagnosis not present

## 2017-10-03 DIAGNOSIS — R609 Edema, unspecified: Secondary | ICD-10-CM | POA: Diagnosis not present

## 2017-10-03 DIAGNOSIS — M7989 Other specified soft tissue disorders: Secondary | ICD-10-CM | POA: Diagnosis not present

## 2017-10-03 DIAGNOSIS — Z7901 Long term (current) use of anticoagulants: Secondary | ICD-10-CM | POA: Diagnosis not present

## 2017-10-03 DIAGNOSIS — D696 Thrombocytopenia, unspecified: Secondary | ICD-10-CM | POA: Diagnosis not present

## 2017-10-03 DIAGNOSIS — Z79899 Other long term (current) drug therapy: Secondary | ICD-10-CM | POA: Diagnosis not present

## 2017-10-03 DIAGNOSIS — D61810 Antineoplastic chemotherapy induced pancytopenia: Secondary | ICD-10-CM | POA: Diagnosis not present

## 2017-10-03 DIAGNOSIS — K219 Gastro-esophageal reflux disease without esophagitis: Secondary | ICD-10-CM | POA: Diagnosis not present

## 2017-10-03 DIAGNOSIS — N1330 Unspecified hydronephrosis: Secondary | ICD-10-CM | POA: Diagnosis not present

## 2017-10-03 DIAGNOSIS — R591 Generalized enlarged lymph nodes: Secondary | ICD-10-CM | POA: Diagnosis not present

## 2017-10-03 DIAGNOSIS — J90 Pleural effusion, not elsewhere classified: Secondary | ICD-10-CM | POA: Diagnosis not present

## 2017-10-03 DIAGNOSIS — R0600 Dyspnea, unspecified: Secondary | ICD-10-CM | POA: Diagnosis not present

## 2017-10-03 DIAGNOSIS — I10 Essential (primary) hypertension: Secondary | ICD-10-CM | POA: Diagnosis not present

## 2017-10-03 LAB — BASIC METABOLIC PANEL
ANION GAP: 8 (ref 5–15)
BUN: 31 mg/dL — AB (ref 6–20)
CALCIUM: 8.2 mg/dL — AB (ref 8.9–10.3)
CO2: 30 mmol/L (ref 22–32)
Chloride: 98 mmol/L — ABNORMAL LOW (ref 101–111)
Creatinine, Ser: 0.5 mg/dL (ref 0.44–1.00)
GFR calc Af Amer: >60 mL/min (ref >60)
GLUCOSE: 99 mg/dL (ref 65–99)
Potassium: 3.8 mmol/L (ref 3.5–5.1)
Sodium: 136 mmol/L (ref 135–145)

## 2017-10-03 LAB — CBC
HCT: 25.8 % — ABNORMAL LOW (ref 36.0–46.0)
Hemoglobin: 8.8 g/dL — ABNORMAL LOW (ref 12.0–15.0)
MCH: 31.7 pg (ref 26.0–34.0)
MCHC: 34.1 g/dL (ref 30.0–36.0)
MCV: 92.8 fL (ref 78.0–100.0)
PLATELETS: 35 10*3/uL — AB (ref 150–400)
RBC: 2.78 MIL/uL — ABNORMAL LOW (ref 3.87–5.11)
RDW: 19.1 % — AB (ref 11.5–15.5)
WBC: 9.3 10*3/uL (ref 4.0–10.5)

## 2017-10-03 MED ORDER — POTASSIUM CHLORIDE CRYS ER 20 MEQ PO TBCR: 20.0000 meq | EXTENDED_RELEASE_TABLET | Freq: Every day | ORAL | Status: DC

## 2017-10-03 MED ORDER — TRAMADOL HCL 50 MG PO TABS: 50.0000 mg | ORAL_TABLET | Freq: Four times a day (QID) | ORAL | 0 refills | Status: DC | PRN

## 2017-10-03 MED ORDER — HEPARIN SOD (PORK) LOCK FLUSH 100 UNIT/ML IV SOLN
500.0000 [IU] | Freq: Once | INTRAVENOUS | Status: AC
  Administered 2017-10-03: 500 [IU]
  Filled 2017-10-03: qty 5

## 2017-10-03 NOTE — Discharge Summary (Signed)
Physician Discharge Summary  Makylie Rivere SAY:[id number] DOB: [birth year] DOA: 09/10/2017  PCP: [name], MD  Admit date: 09/10/2017 Discharge date: 10/03/2017  Recommendations for Outpatient Follow-up:  1. Pt will need to follow up with PCP in 1-2 weeks post discharge 2. Please obtain BMP to evaluate electrolytes and kidney function, K level  3. Pt was discharged on K-dur but if K needs readjustment, please address based on blood work  4. Please also check CBC to evaluate Hg and Hct levels, Plt, WBC 5. Pt did get dose of Granix on 10/15 and will get one more dose today before discharge   Discharge Diagnoses:  Active Problems:   Hypotension   DLBCL (diffuse large B cell lymphoma) (HCC)   Antineoplastic chemotherapy induced pancytopenia (HCC)   SOB (shortness of breath)   Acute respiratory failure with hypoxia (HCC)   Obstructive uropathy  Discharge Condition: Stable  Diet recommendation: Heart healthy diet discussed in details   Brief Narrative:  Pt is 71 yo female with known DLBCL (started on chemo Cytoxan and Vincristine on 9/20), RA, HLD, HTN, recent hospitalization for right sided hydronephrosis and is now s/p perc nephrostomy tube placement and exchange on 08/31/2017 after tube malfunction, presented with weakness, found to be hypotensive with presumptive sepsis. In ED, pt has received boluses of IV NS and was admitted for further evaluation. She has initially required vasopressor support and received significant IVF resuscitation. Pressors were weaned and diuresis started, slowly improving.    Assessment & Plan: Anasarca/acute on chronic diastolic CHF: Echocardiogram on 08/30/2017 shows grade 1 diastolic dysfunction. EF 60-65%. - UOP robust with lasix 40mg  IV BID, will continue this as creatinine and BP stable. Still with LE swelling. Dry weight should be close to 180lbs per family and rose to 248lbs. Was up >13L on 9/28. - weight trend below Filed Weights    10/01/17 0513 10/02/17 0819 10/03/17 0700  Weight: 82.1 kg (181 lb 1.6 oz) 81.1 kg (178 lb 12.8 oz) 79.6 kg (175 lb 8 oz)   Hypotension: suspected distributive shock, third-spacing IVF's administered earlier this hospitalization. Has been off pressors since 09/14/2017 - No evidence of sepsis at this time. Blood cultures NGTD  - s/p 5 days abx.  - completed prednisone taper   Acute respiratory distress: Suspect acute diastolic CHF with pleural effusions and parainfluenza virus.  - Improved with thoracentesis 10/4, 580cc transudate - resp status stable this AM   Right-sided hydronephrosis: Thought to be due to extrinsic compression due to malignancy, now s/p replacement of perc nephrostomy tube 9/14. Output is good. - Discussed with urology, Dr. Gloriann Loan. Nephrostomy tube without clinical evidence of malfunction, so no indication to exchange at this time. Can remain in up to 3 months, so will have follow up with urology, Dr. Karsten Ro, as outpatient.  - Cause of hydronephrosis is being addressed   Hypokalemia and hypomagnesemia: - K and Mg WNL  DLBCL: Incidentally noted regression of lymphadenopathy on CT chest after 1st cycle of chemotherapy.  - Appreciate heme/onc assistance: chemo 10/10 & 10/11. S/P intrathecal MTX 10/9.   Severe protein calorie malnutrition in setting of morbid obesity: - Continue po supplementation. Hoping to improve lean body mass.   Anemia and pancytopenia: Suspect chemotx induced, improving s/p G-CSF. - Hg somewhat down overnight and Plt also down a bit - 1u PRBCs 10/8, hgb stable since.  - Per onc, should receive irradiated blood products. - transfused one U PRBC 10/15 and Hg appropriately up   Constipation: - had BM  Rheumatoid arthritis. - On methotrexate in the past currently not on any medication.  Obesity  - Body mass index is 37.85 kg/m.  DVT prophylaxis: SCD's Code Status: Full Family Communication: pt at bedside  Disposition Plan:  d/c  Consultants:   Oncology  PCCM   Nephrology by phone 10/6.  Urology by phone 10/7  Procedures:   ECHO 9/13: EF 60% to 86%, grade 1 diastolic dysfunction  Antimicrobials:   Vanc and Zosyn x5 days    Procedures/Studies: Dg Chest 2 View  Result Date: 09/22/2017 CLINICAL DATA:  Shortness of breath and congestion. EXAM: CHEST  2 VIEW COMPARISON:  09/20/2017 FINDINGS: Port-A-Cath tip in the lower SVC. Hazy lung densities have not significantly changed. Small amount of pleural fluid based on the lateral view. Negative for pneumothorax. Heart size is normal. IMPRESSION: Persistent mild bilateral airspace disease with minimal change. Small amount of pleural fluid. Electronically Signed   By: Markus Daft M.D.   On: 09/22/2017 10:31   Ct Chest Wo Contrast  Result Date: 09/19/2017 CLINICAL DATA:  71 year old female with newly diagnosed diffuse large B-cell lymphoma in September 2018, currently undergoing chemotherapy. Cough and shortness of breath for the past 5 days. EXAM: CT CHEST WITHOUT CONTRAST TECHNIQUE: Multidetector CT imaging of the chest was performed following the standard protocol without IV contrast. COMPARISON:  CT of the chest, abdomen and pelvis 09/02/2017. FINDINGS: Cardiovascular: Heart size is normal. There is no significant pericardial fluid, thickening or pericardial calcification. Atherosclerosis in the thoracic aorta. Coronary artery calcification in the left anterior descending coronary artery. Right internal jugular single-lumen porta cath with tip terminating at the superior cavoatrial junction. Partial anomalous pulmonary venous return from the left upper lobe to the innominate vein incidentally noted (normal anatomical variant). Mediastinum/Nodes: No pathologically enlarged mediastinal or hilar lymph nodes. Please note that accurate exclusion of hilar adenopathy is limited on noncontrast CT scans. Several densely calcified mediastinal lymph nodes are incidentally  noted. Esophagus is unremarkable in appearance. No axillary lymphadenopathy. Lungs/Pleura: Worsening moderate bilateral pleural effusions lying dependently with areas of subsegmental atelectasis in the lower lobes of the lungs bilaterally (left greater than right). Patchy multifocal ground-glass attenuation and interlobular septal thickening noted throughout the mid to upper lungs bilaterally. Mild diffuse bronchial wall thickening with mild centrilobular and paraseptal emphysema. Upper Abdomen: Large partially calcified low attenuation lesion in the dome of the right lobe of the liver, similar to numerous prior examinations, incompletely characterized on today's noncontrast CT examination, but likely to represent a large cavernous hemangioma. Large volume of ascites incompletely visualized. Musculoskeletal: Diffuse body wall edema. There are no aggressive appearing lytic or blastic lesions noted in the visualized portions of the skeleton. IMPRESSION: 1. Regression of previously noted lymphadenopathy indicative of a positive response to therapy. 2. New patchy areas of mid to upper lung predominant ground-glass attenuation and interlobular septal thickening. This is nonspecific, but given the normal cardiac size, this does not appear to reflect cardiogenic pulmonary edema. Noncardiogenic edema, alveolar hemorrhage and/or acute atypical infection are the primary differential considerations. 3. Increasing moderate bilateral pleural effusions, large volume of ascites, and diffuse body wall edema, suggestive of a state of underlying anasarca. 4. Aortic atherosclerosis, in addition to left anterior descending coronary artery disease. Assessment for potential risk factor modification, dietary therapy or pharmacologic therapy may be warranted, if clinically indicated. 5. Mild diffuse bronchial wall thickening with mild centrilobular and paraseptal emphysema; imaging findings suggestive of underlying COPD. 6. Additional  incidental findings, as above. Aortic Atherosclerosis (ICD10-I70.0)  and Emphysema (ICD10-J43.9). Electronically Signed   By: Vinnie Langton M.D.   On: 09/19/2017 15:20   Ir US Guide Vasc Access Right  Result Date: 09/10/2017 CLINICAL DATA:  Lymphoma, needs durable venous access for planned chemotherapy regimen. EXAM: TUNNELED PORT CATHETER PLACEMENT WITH ULTRASOUND AND FLUOROSCOPIC GUIDANCE FLUOROSCOPY TIME:  6 seconds, 0.6 mGy ANESTHESIA/SEDATION: Intravenous Fentanyl and Versed were administered as conscious sedation during continuous monitoring of the patient's level of consciousness and physiological / cardiorespiratory status by the radiology RN, with a total moderate sedation time of 13 minutes. TECHNIQUE: The procedure, risks, benefits, and alternatives were explained to the patient. Questions regarding the procedure were encouraged and answered. The patient understands and consents to the procedure. As antibiotic prophylaxis, cefazolin 2 g K Dr. has was ordered pre-procedure and administered intravenously within one hour of incision. Patency of the right IJ vein was confirmed with ultrasound with image documentation. An appropriate skin site was determined. Skin site was marked. Region was prepped using maximum barrier technique including cap and mask, sterile gown, sterile gloves, large sterile sheet, and Chlorhexidine as cutaneous antisepsis. The region was infiltrated locally with 1% lidocaine. Under real-time ultrasound guidance, the right IJ vein was accessed with a 21 gauge micropuncture needle; the needle tip within the vein was confirmed with ultrasound image documentation. Needle was exchanged over a 018 guidewire for transitional dilator which allowed passage of the [facility] wire into the IVC. Over this, the transitional dilator was exchanged for a 5 [place] MPA catheter. A small incision was made on the right anterior chest wall and a subcutaneous pocket fashioned. The power-injectable port was  positioned and its catheter tunneled to the right IJ dermatotomy site. The MPA catheter was exchanged over an Amplatz wire for a peel-away sheath, through which the port catheter, which had been trimmed to the appropriate length, was advanced and positioned under fluoroscopy with its tip at the cavoatrial junction. Spot chest radiograph confirms good catheter position and no pneumothorax. The pocket was closed with deep interrupted and subcuticular continuous 3-0 Monocryl sutures. The port was flushed per protocol. The incisions were covered with Dermabond then covered with a sterile dressing. COMPLICATIONS: COMPLICATIONS None immediate IMPRESSION: Technically successful right IJ power-injectable port catheter placement. Ready for routine use. Electronically Signed   By: Lucrezia Europe M.D.   On: 09/07/2017 08:59   Dg Chest Port 1 View  Result Date: 09/20/2017 CLINICAL DATA:  Status post left thoracentesis EXAM: PORTABLE CHEST 1 VIEW COMPARISON:  09/18/2017 FINDINGS: There is no evidence of pneumothorax after left thoracentesis. Left pleural effusion has improved. Bibasilar atelectasis. Stable right jugular Port-A-Cath. Upper normal heart size. IMPRESSION: No pneumothorax post left thoracentesis. Electronically Signed   By: Marybelle Killings M.D.   On: 09/20/2017 14:52   Dg Chest Port 1 View  Result Date: 09/18/2017 CLINICAL DATA:  Shortness of breath. EXAM: PORTABLE CHEST 1 VIEW COMPARISON:  09/10/2017 FINDINGS: Power port appears in good position. Tip is approximately 2 cm below the carina in good position. Since the prior study the patient has developed bilateral pleural effusions, left greater than right. Pulmonary vascularity appears normal. Heart size is normal. No acute bone abnormality. IMPRESSION: New bilateral pleural effusions. Small on the right and moderate on the left. Electronically Signed   By: Lorriane Shire M.D.   On: 09/18/2017 08:51   Dg Chest Port 1 View  Result Date: 09/10/2017 CLINICAL  DATA:  Hypotension EXAM: PORTABLE CHEST 1 VIEW COMPARISON:  09/01/2017, CT chest 09/02/2017 FINDINGS: Interval placement  of a right-sidedcentral venous port with tip overlying the mid SVC. Hazy bibasilar atelectasis or infiltrate at the bases. Stable cardiomediastinal silhouette. No pneumothorax. IMPRESSION: No significant interval change in left greater than right hazy bibasilar atelectasis or infiltrates. Electronically Signed   By: Donavan Foil M.D.   On: 09/10/2017 23:32   Dg Abd Portable 1v  Result Date: 09/15/2017 CLINICAL DATA:  Constipation for 3 days EXAM: PORTABLE ABDOMEN - 1 VIEW COMPARISON:  09/02/2017 CT and prior studies FINDINGS: Nondistended gas-filled loops of small bowel are noted. A moderate amount of stool in the ascending colon and rectum noted. A right percutaneous nephrostomy catheter is again noted. No pneumoperitoneum. IMPRESSION: Nonspecific nonobstructive bowel gas pattern. Moderate stool in the ascending colon and rectum. Electronically Signed   By: Margarette Canada M.D.   On: 09/15/2017 10:14   Ir Nephrostomy Exchange Right  Result Date: 09/11/2017 INDICATION: History of diffuse large B-cell lymphoma with right-sided hydro nephrosis secondary to bulky retroperitoneal lymphadenopathy, post ultrasound fluoroscopic guided nephrostomy catheter placement on 08/21/2017 and subsequent nephrostomy catheter exchange on 08/31/2017. The external portion of nephrostomy catheter has been fractured, and as such, request has been made for fluoroscopic guided exchange. EXAM: FLUOROSCOPIC GUIDED RIGHT SIDED NEPHROSTOMY CATHETER EXCHANGE COMPARISON:  CT abdomen and pelvis - 08/20/2017 ; 08/31/2017; ultrasound fluoroscopic guided right-sided percutaneous nephrostomy catheter placement - 08/21/2017; fluoroscopic guided right-sided nephrostomy catheter exchange - 08/31/2017 CONTRAST:  10 mL Isovue-300 administered into the collecting system FLUOROSCOPY TIME:  54 seconds (47.4 mGy) COMPLICATIONS: None  immediate. TECHNIQUE: Informed written consent was obtained from the patient after a discussion of the risks, benefits and alternatives to treatment. Questions regarding the procedure were encouraged and answered. A timeout was performed prior to the initiation of the procedure. The right flank and external portion of existing nephrostomy catheter were prepped and draped in the usual sterile fashion. A sterile drape was applied covering the operative field. Maximum barrier sterile technique with sterile gowns and gloves were used for the procedure. A timeout was performed prior to the initiation of the procedure. A pre procedural spot fluoroscopic image was obtained after contrast was injected via the existing nephrostomy catheter demonstrating appropriate positioning within the renal pelvis. The existing nephrostomy catheter was cut and cannulated with a short Amplatz wire which was coiled within the renal pelvis. Under intermittent fluoroscopic guidance, the existing nephrostomy catheter was exchanged for a new 10.2 [place] all-purpose drainage catheter. Contrast injection confirmed appropriate positioning within the renal pelvis and a post exchange fluoroscopic image was obtained. The catheter was locked and secured to the skin with a suture. A dressing was placed. The patient tolerated the procedure well without immediate postprocedural complication. FINDINGS: The existing nephrostomy catheter is appropriately positioned. After successful fluoroscopic guided exchange, the new nephrostomy catheter is coiled and locked within the right renal pelvis. Re- demonstrated marked dysmorphic appearance of the right renal collecting system. IMPRESSION: Successful fluoroscopic guided exchange of right sided 10.2 French percutaneous nephrostomy catheter. Electronically Signed   By: Sandi Mariscal M.D.   On: 09/11/2017 12:20   Ir Fluoro Guide Port Insertion Right  Result Date: 09/07/2017 CLINICAL DATA:  Lymphoma, needs  durable venous access for planned chemotherapy regimen. EXAM: TUNNELED PORT CATHETER PLACEMENT WITH ULTRASOUND AND FLUOROSCOPIC GUIDANCE FLUOROSCOPY TIME:  6 seconds, 0.6 mGy ANESTHESIA/SEDATION: Intravenous Fentanyl and Versed were administered as conscious sedation during continuous monitoring of the patient's level of consciousness and physiological / cardiorespiratory status by the radiology RN, with a total moderate sedation time of 13  minutes. TECHNIQUE: The procedure, risks, benefits, and alternatives were explained to the patient. Questions regarding the procedure were encouraged and answered. The patient understands and consents to the procedure. As antibiotic prophylaxis, cefazolin 2 g K Dr. has was ordered pre-procedure and administered intravenously within one hour of incision. Patency of the right IJ vein was confirmed with ultrasound with image documentation. An appropriate skin site was determined. Skin site was marked. Region was prepped using maximum barrier technique including cap and mask, sterile gown, sterile gloves, large sterile sheet, and Chlorhexidine as cutaneous antisepsis. The region was infiltrated locally with 1% lidocaine. Under real-time ultrasound guidance, the right IJ vein was accessed with a 21 gauge micropuncture needle; the needle tip within the vein was confirmed with ultrasound image documentation. Needle was exchanged over a 018 guidewire for transitional dilator which allowed passage of the [facility] wire into the IVC. Over this, the transitional dilator was exchanged for a 5 [place] MPA catheter. A small incision was made on the right anterior chest wall and a subcutaneous pocket fashioned. The power-injectable port was positioned and its catheter tunneled to the right IJ dermatotomy site. The MPA catheter was exchanged over an Amplatz wire for a peel-away sheath, through which the port catheter, which had been trimmed to the appropriate length, was advanced and positioned  under fluoroscopy with its tip at the cavoatrial junction. Spot chest radiograph confirms good catheter position and no pneumothorax. The pocket was closed with deep interrupted and subcuticular continuous 3-0 Monocryl sutures. The port was flushed per protocol. The incisions were covered with Dermabond then covered with a sterile dressing. COMPLICATIONS: COMPLICATIONS None immediate IMPRESSION: Technically successful right IJ power-injectable port catheter placement. Ready for routine use. Electronically Signed   By: Lucrezia Europe M.D.   On: 09/07/2017 08:59   US Thoracentesis Asp Pleural Space W/img Guide  Result Date: 09/20/2017 INDICATION: Patient with history of diffuse large B-cell lymphoma, dyspnea, bilateral pleural effusions left greater than right. Request made for diagnostic and therapeutic left thoracentesis. EXAM: ULTRASOUND GUIDED DIAGNOSTIC AND THERAPEUTIC LEFT THORACENTESIS MEDICATIONS: None. COMPLICATIONS: None immediate. PROCEDURE: An ultrasound guided thoracentesis was thoroughly discussed with the patient and questions answered. The benefits, risks, alternatives and complications were also discussed. The patient understands and wishes to proceed with the procedure. Written consent was obtained. Ultrasound was performed to localize and mark an adequate pocket of fluid in the left chest. The area was then prepped and draped in the normal sterile fashion. 2% Lidocaine was used for local anesthesia. Under ultrasound guidance a Safe-T-Centesis catheter was introduced. Thoracentesis was performed. The catheter was removed and a dressing applied. FINDINGS: A total of approximately 580 cc of light yellow fluid was removed. Samples were sent to the laboratory as requested by the clinical team. IMPRESSION: Successful ultrasound guided diagnostic and therapeutic left thoracentesis yielding 580 cc of pleural fluid. Read by: Rowe Robert, PA-C Electronically Signed   By: Markus Daft M.D.   On: 09/20/2017  12:59   Dg Fluoro Guide Lumbar Puncture  Result Date: 09/25/2017 CLINICAL DATA:  71 year old female with lymphoma. Subsequent encounter. EXAM: DIAGNOSTIC LUMBAR PUNCTURE UNDER FLUOROSCOPIC GUIDANCE FLUOROSCOPY TIME:  Fluoroscopy Time:  40 second Radiation Exposure Index 16.4 mGy PROCEDURE: Case discussed with Dr. Burr Medico.  Labs and imaging checked. Informed consent was obtained from the patient prior to the procedure, including potential complications of headache, infection, bleeding or drug reaction. Time-out performed. With the patient prone, the lower back was prepped with Betadine. 1% Lidocaine was used for local  anesthesia. Lumbar puncture was performed at the L3-4 level with a single pass of a 22 gauge needle with return of 5 CSF sent for labs as per request. Pharmacy prepared and 12 mg methotrexate with 50 mg hydrocortisone within 5 cc of fluid was slowly instilled. Postprocedure instructions reviewed with the patient. The patient tolerated the procedure well and there were no apparent complications. IMPRESSION: L3-4 lumbar puncture with installation of chemotherapy. No immediate complication. Electronically Signed   By: Genia Del M.D.   On: 09/25/2017 12:30    Discharge Exam: Vitals:   10/02/17 2005 10/03/17 0544  BP: 115/74 130/89  Pulse: 92 97  Resp: (!) 21 20  Temp: 98.4 F (36.9 C) 99.2 F (37.3 C)  SpO2: 100% 98%   Vitals:   10/02/17 1551 10/02/17 2005 10/03/17 0544 10/03/17 0700  BP: 108/62 115/74 130/89   Pulse: 95 92 97   Resp: (!) 22 (!) 21 20   Temp: 97.8 F (36.6 C) 98.4 F (36.9 C) 99.2 F (37.3 C)   TempSrc: Oral Oral Oral   SpO2: 100% 100% 98%   Weight:    79.6 kg (175 lb 8 oz)  Height:        Physical Exam  Constitutional: Appears calm , NAD CVS: RRR, S1/S2 +, no murmurs, no gallops, no carotid bruit.  Pulmonary: Effort and breath sounds normal, no stridor, rhonchi, wheezes, rales.  Abdominal: Soft. BS +,  no distension, tenderness, rebound or guarding.   Musculoskeletal: Normal range of motion. 1 bilateral LE pitting edema   Discharge Instructions  Discharge Instructions    Diet - low sodium heart healthy    Complete by:  As directed    Increase activity slowly    Complete by:  As directed        Contact information for follow-up providers    [name], MD Follow up.   Specialty:  Internal Medicine Contact information: [address] [place] [address] [phone]            Contact information for after-discharge care    Destination    [facility] .   Specialty:  [place] information: [address] [place] [address] [phone]                 Allergies as of 10/03/2017      Reactions   Streptomycin Anaphylaxis      Medication List    TAKE these medications   acetaminophen 325 MG tablet Commonly known as:  TYLENOL Take 2 tablets (650 mg total) by mouth every 6 (six) hours as needed for mild pain (or Fever >/= 101).   albuterol (2.5 MG/3ML) 0.083% nebulizer solution Commonly known as:  PROVENTIL Take 3 mLs (2.5 mg total) by nebulization every 4 (four) hours as needed for wheezing or shortness of breath.   allopurinol 300 MG tablet Commonly known as:  ZYLOPRIM Take 300 mg by mouth daily.   dextromethorphan-guaiFENesin 30-600 MG 12hr tablet Commonly known as:  MUCINEX DM Take 1 tablet by mouth 2 (two) times daily as needed for cough.   diphenoxylate-atropine 2.5-0.025 MG tablet Commonly known as:  LOMOTIL Take 1 tablet by mouth 4 (four) times daily as needed for diarrhea or loose stools.   furosemide 20 MG tablet Commonly known as:  LASIX Take 2 tablets (40 mg total) by mouth 2 (two) times daily. What changed:  how much to take  when to take this   ondansetron  4 MG tablet Commonly known as:  ZOFRAN Take 4 mg by mouth. Take one tablet every 6 hours as needed   pantoprazole 40 MG tablet Commonly known as:   PROTONIX Take 1 tablet (40 mg total) by mouth daily.   potassium chloride SA 20 MEQ tablet Commonly known as:  K-DUR,KLOR-CON Take 1 tablet (20 mEq total) by mouth daily.   simvastatin 20 MG tablet Commonly known as:  ZOCOR Take 20 mg by mouth daily.   traMADol 50 MG tablet Commonly known as:  ULTRAM Take 1 tablet (50 mg total) by mouth every 6 (six) hours as needed for moderate pain or severe pain.        The results of significant diagnostics from this hospitalization (including imaging, microbiology, ancillary and laboratory) are listed below for reference.     Microbiology: No results found for this or any previous visit (from the past 240 hour(s)).   Labs: Basic Metabolic Panel:  Recent Labs Lab 09/27/17 0500 09/28/17 0529 09/29/17 0453 09/30/17 0432 10/01/17 0459 10/02/17 0558 10/03/17 0500  NA 139 139 140 139 139 139 136  K 3.8 4.0 4.2 4.0 4.1 3.9 3.8  CL 104 102 106 104 103 101 98*  CO2 30 28 28 28 28 29 30   GLUCOSE 99 116* 109* 100* 97 89 99  BUN 30* 39* 46* 47* 43* 38* 31*  CREATININE 0.61 0.69 0.55 0.56 0.53 0.47 0.50  CALCIUM 7.4* 7.3* 7.4* 7.7* 7.9* 8.3* 8.2*  MG 1.6* 2.0 1.9  --  1.9  --   --    CBC:  Recent Labs Lab 09/29/17 0453 09/30/17 0432 10/01/17 0459 10/01/17 2230 10/02/17 0558 10/03/17 0500  WBC 5.1 4.9 4.6  --  3.4* 9.3  NEUTROABS  --   --   --   --  2.9  --   HGB 8.0* 7.8* 7.6* 9.3* 9.4* 8.8*  HCT 24.2* 23.2* 22.5* 27.1* 27.6* 25.8*  MCV 93.8 94.3 93.8  --  93.2 92.8  PLT 105* 87* 63*  --  50* 35*   BNP (last 3 results)  Recent Labs  08/31/17 1430 09/10/17 2309  BNP 21.1 34.5   SIGNED: Time coordinating discharge: 60 minutes  Faye Ramsay, MD  [facility] 10/03/2017, 10:44 AM Pager [phone]  If 7PM-7AM, please contact night-coverage [email or web address] Password [email or web address]

## 2017-10-03 NOTE — Progress Notes (Signed)
[facility] has agreed to administer Granix 300 mcg on 10/18 and 10/19 per our request. I have faxed an order. Dr. Hinton Rao will see her next week for new patient consult. I have informed the patient of this plan, she will arrange transportation from [facility] to [place].

## 2017-10-03 NOTE — Clinical Social Work Placement (Signed)
Pt discharging today to admit to [facility] in [place]. Report # [id number].  Pt will transport via [place] completed medical necessity form and will arrange transport once pt ready (awaiting port to be de-accessed and to receive medication prior to DC). Pt's husband informed of plan and is agreeable. CSW confirmed with medical team pt is no longer on chemotherapy precautions, therefore can admit to semiprivate room at [facility] (no private room available today) and transfer to private room once there is one available in upcoming days.  See below for placement details.   CLINICAL SOCIAL WORK PLACEMENT  NOTE  Date:  10/03/2017  Patient Details  Name: Kristina Torres MRN: [id number] Date of Birth: 21-Feb-1946  Clinical Social Work is seeking post-discharge placement for this patient at the [place] level of care (*CSW will initial, date and re-position this form in  chart as items are completed):  Yes   Patient/family provided with [place] Work Department's list of facilities offering this level of care within the geographic area requested by the patient (or if unable, by the patient's family).  Yes   Patient/family informed of their freedom to choose among providers that offer the needed level of care, that participate in Medicare, Medicaid or managed care program needed by the patient, have an available bed and are willing to accept the patient.  Yes   Patient/family informed of [facility]'s ownership interest in [facility] and [facility], as well as of the fact that they are under no obligation to receive care at these facilities.  PASRR submitted to EDS on       PASRR number received on       Existing PASRR number confirmed on 09/19/17     FL2 transmitted to all facilities in geographic area requested by pt/family on 09/19/17     FL2 transmitted to all facilities within larger geographic area on 09/19/17      Patient informed that his/her managed care company has contracts with or will negotiate with certain facilities, including the following:        Yes   Patient/family informed of bed offers received.  Patient chooses bed at  [facility])     Physician recommends and patient chooses bed at  [facility])    Patient to be transferred to  [facility]) on 10/03/17.  Patient to be transferred to facility by [facility]     Patient family notified on 10/03/17 of transfer.  Name of family member notified:  husband [place]     PHYSICIAN       Additional Comment:    _______________________________________________ Nila Nephew, LCSW 10/03/2017, 11:08 AM

## 2017-10-04 DIAGNOSIS — C8330 Diffuse large B-cell lymphoma, unspecified site: Secondary | ICD-10-CM | POA: Diagnosis not present

## 2017-10-05 DIAGNOSIS — N139 Obstructive and reflux uropathy, unspecified: Secondary | ICD-10-CM | POA: Diagnosis not present

## 2017-10-05 DIAGNOSIS — C8330 Diffuse large B-cell lymphoma, unspecified site: Secondary | ICD-10-CM | POA: Diagnosis not present

## 2017-10-05 DIAGNOSIS — J90 Pleural effusion, not elsewhere classified: Secondary | ICD-10-CM | POA: Diagnosis not present

## 2017-10-05 DIAGNOSIS — J9600 Acute respiratory failure, unspecified whether with hypoxia or hypercapnia: Secondary | ICD-10-CM | POA: Diagnosis not present

## 2017-10-08 DIAGNOSIS — C8330 Diffuse large B-cell lymphoma, unspecified site: Secondary | ICD-10-CM | POA: Diagnosis not present

## 2017-10-09 DIAGNOSIS — Z23 Encounter for immunization: Secondary | ICD-10-CM | POA: Diagnosis not present

## 2017-10-09 DIAGNOSIS — C8338 Diffuse large B-cell lymphoma, lymph nodes of multiple sites: Secondary | ICD-10-CM | POA: Diagnosis not present

## 2017-10-09 DIAGNOSIS — M069 Rheumatoid arthritis, unspecified: Secondary | ICD-10-CM | POA: Diagnosis not present

## 2017-10-09 DIAGNOSIS — Z936 Other artificial openings of urinary tract status: Secondary | ICD-10-CM | POA: Diagnosis not present

## 2017-10-09 DIAGNOSIS — Z7409 Other reduced mobility: Secondary | ICD-10-CM

## 2017-10-09 DIAGNOSIS — N1330 Unspecified hydronephrosis: Secondary | ICD-10-CM | POA: Diagnosis not present

## 2017-10-10 DIAGNOSIS — N139 Obstructive and reflux uropathy, unspecified: Secondary | ICD-10-CM | POA: Diagnosis not present

## 2017-10-10 DIAGNOSIS — J90 Pleural effusion, not elsewhere classified: Secondary | ICD-10-CM | POA: Diagnosis not present

## 2017-10-10 DIAGNOSIS — D61810 Antineoplastic chemotherapy induced pancytopenia: Secondary | ICD-10-CM | POA: Diagnosis not present

## 2017-10-10 DIAGNOSIS — C8330 Diffuse large B-cell lymphoma, unspecified site: Secondary | ICD-10-CM | POA: Diagnosis not present

## 2017-10-17 DIAGNOSIS — C8330 Diffuse large B-cell lymphoma, unspecified site: Secondary | ICD-10-CM | POA: Diagnosis not present

## 2017-10-17 DIAGNOSIS — Z5111 Encounter for antineoplastic chemotherapy: Secondary | ICD-10-CM | POA: Diagnosis not present

## 2017-10-19 DIAGNOSIS — J9600 Acute respiratory failure, unspecified whether with hypoxia or hypercapnia: Secondary | ICD-10-CM | POA: Diagnosis not present

## 2017-10-19 DIAGNOSIS — N139 Obstructive and reflux uropathy, unspecified: Secondary | ICD-10-CM | POA: Diagnosis not present

## 2017-10-19 DIAGNOSIS — C8330 Diffuse large B-cell lymphoma, unspecified site: Secondary | ICD-10-CM | POA: Diagnosis not present

## 2017-10-19 DIAGNOSIS — J90 Pleural effusion, not elsewhere classified: Secondary | ICD-10-CM | POA: Diagnosis not present

## 2017-10-22 DIAGNOSIS — J90 Pleural effusion, not elsewhere classified: Secondary | ICD-10-CM | POA: Diagnosis not present

## 2017-10-22 DIAGNOSIS — C8330 Diffuse large B-cell lymphoma, unspecified site: Secondary | ICD-10-CM | POA: Diagnosis not present

## 2017-10-22 DIAGNOSIS — N139 Obstructive and reflux uropathy, unspecified: Secondary | ICD-10-CM | POA: Diagnosis not present

## 2017-10-22 DIAGNOSIS — E876 Hypokalemia: Secondary | ICD-10-CM | POA: Diagnosis not present

## 2017-10-26 DIAGNOSIS — I959 Hypotension, unspecified: Secondary | ICD-10-CM | POA: Diagnosis not present

## 2017-10-26 DIAGNOSIS — D61810 Antineoplastic chemotherapy induced pancytopenia: Secondary | ICD-10-CM | POA: Diagnosis not present

## 2017-10-26 DIAGNOSIS — N139 Obstructive and reflux uropathy, unspecified: Secondary | ICD-10-CM | POA: Diagnosis not present

## 2017-10-26 DIAGNOSIS — C8330 Diffuse large B-cell lymphoma, unspecified site: Secondary | ICD-10-CM | POA: Diagnosis not present

## 2017-10-30 ENCOUNTER — Emergency Department (HOSPITAL_COMMUNITY)
Admission: EM | Admit: 2017-10-30 | Discharge: 2017-10-30 | Disposition: A | Discharge status: Home / Self Care | Payer: Medicare Other | Attending: Emergency Medicine | Admitting: Emergency Medicine

## 2017-10-30 ENCOUNTER — Encounter (HOSPITAL_COMMUNITY): Payer: Self-pay | Admitting: Emergency Medicine

## 2017-10-30 ENCOUNTER — Other Ambulatory Visit: Payer: Self-pay

## 2017-10-30 DIAGNOSIS — I10 Essential (primary) hypertension: Secondary | ICD-10-CM | POA: Insufficient documentation

## 2017-10-30 DIAGNOSIS — Z79899 Other long term (current) drug therapy: Secondary | ICD-10-CM | POA: Diagnosis not present

## 2017-10-30 DIAGNOSIS — Z87891 Personal history of nicotine dependence: Secondary | ICD-10-CM | POA: Insufficient documentation

## 2017-10-30 DIAGNOSIS — Y731 Therapeutic (nonsurgical) and rehabilitative gastroenterology and urology devices associated with adverse incidents: Secondary | ICD-10-CM | POA: Insufficient documentation

## 2017-10-30 DIAGNOSIS — Z936 Other artificial openings of urinary tract status: Secondary | ICD-10-CM | POA: Diagnosis not present

## 2017-10-30 DIAGNOSIS — N1330 Unspecified hydronephrosis: Secondary | ICD-10-CM | POA: Diagnosis not present

## 2017-10-30 DIAGNOSIS — J9382 Other air leak: Secondary | ICD-10-CM | POA: Diagnosis not present

## 2017-10-30 DIAGNOSIS — D61818 Other pancytopenia: Secondary | ICD-10-CM | POA: Diagnosis not present

## 2017-10-30 DIAGNOSIS — Z7901 Long term (current) use of anticoagulants: Secondary | ICD-10-CM | POA: Diagnosis not present

## 2017-10-30 DIAGNOSIS — M069 Rheumatoid arthritis, unspecified: Secondary | ICD-10-CM | POA: Diagnosis not present

## 2017-10-30 DIAGNOSIS — T83092A Other mechanical complication of nephrostomy catheter, initial encounter: Secondary | ICD-10-CM | POA: Insufficient documentation

## 2017-10-30 DIAGNOSIS — C8330 Diffuse large B-cell lymphoma, unspecified site: Secondary | ICD-10-CM | POA: Diagnosis not present

## 2017-10-30 DIAGNOSIS — T83098A Other mechanical complication of other indwelling urethral catheter, initial encounter: Secondary | ICD-10-CM

## 2017-10-30 NOTE — ED Notes (Signed)
Pt verbalizes understanding of d/c instructions. Pt discharged with [facility] with all belongings.

## 2017-10-30 NOTE — ED Provider Notes (Signed)
Medical screening examination/treatment/procedure(s) were conducted as a shared visit with non-physician practitioner(s) and myself.  I personally evaluated the patient during the encounter.   EKG Interpretation None      70 year old female who presents with nephrostomy tube complication.  States that her nephrostomy tube on the right side with accidentally nicked during dressing changes 2-3 days ago.  She had presented to interventional radiology for an appointment for tube exchange, but was sent to the ED as there is no apparent order for her to receive this procedure.  Patient has no complaints. No fever, chills, abdominal pain or severe flank pain, nausea, or vomiting. Discussed with IR, Dr. Vernard Gambles. Order placed for nephrostomy tube exchange. Patient will be discharged. IR clinic to call her facility to tell her time and location of appointment tomorrow.     Forde Dandy, MD 10/30/17 [phone]

## 2017-10-30 NOTE — ED Notes (Signed)
Old dressing removed. Replaced dressing with abd pad, 4x4s, and medical tape. No bleeding from site. Site is painful per patient.

## 2017-10-30 NOTE — ED Provider Notes (Signed)
[place] EMERGENCY DEPARTMENT Provider Note   CSN: [id number] Arrival date & time: 10/30/17  1602     History   Chief Complaint Chief Complaint  Patient presents with  . Nephrostomy Tube Replacement    HPI Kristina Torres is a 71 y.o. female who presented to [facility] for scheduled ambulatory nephrostomy tube exchange. She reports that upon arrival she was told that her appointment was not in the system. She reported being clearly told to present to [facility] for 3:30 appointment with IR for right nephrostomy tube exchange. She explains that after a dressing change over the weekend the tubing must have been cut because when a staff member uncovered her tube yesterday they noted a leak. She has also been experiencing pain in the area which is relieved by lying down on the gurney and denies any pain at this time. She had blood work done today at a different facility and everything was normal, no fever, chills, nausea, vomiting or other symptoms.  HPI  Past Medical History:  Diagnosis Date  . Acute urinary retention 08/29/2017  . AKI (acute kidney injury) ([place]) 08/20/2017  . Cytopenia   . DLBCL (diffuse large B cell lymphoma) ([place]) 09/03/2017  . Essential hypertension   . Gout   . HLD (hyperlipidemia)   . Hyperbilirubinemia 08/29/2017  . Hypercalcemia 08/20/2017  . Hypomagnesemia 08/29/2017  . Hypophosphatemia 08/29/2017  . Hypotension 08/20/2017  . Lymphadenopathy   . Macrocytic anemia 08/21/2017  . Nausea & vomiting 08/20/2017  . Rheumatoid arthritis ([place])   . Sepsis ([place]) 08/20/2017  . Thrombocytopenia ([place]) 08/20/2017    Patient Active Problem List   Diagnosis Date Noted  . Acute respiratory failure with hypoxia ([place])   . Obstructive uropathy   . SOB (shortness of breath)   . Antineoplastic chemotherapy induced pancytopenia ([place])   . DLBCL (diffuse large B cell lymphoma) ([place]) 09/03/2017  . Cytopenia   . Hypophosphatemia 08/29/2017  . Hypomagnesemia  08/29/2017  . Hyponatremia 08/29/2017  . Acute urinary retention 08/29/2017  . Hyperbilirubinemia 08/29/2017  . Anemia 08/21/2017  . Lymphadenopathy   . UTI (urinary tract infection) 08/20/2017  . Sepsis ([place]) 08/20/2017  . Hypotension 08/20/2017  . AKI (acute kidney injury) ([place]) 08/20/2017  . Nausea & vomiting 08/20/2017  . Hypercalcemia 08/20/2017  . Thrombocytopenia ([place]) 08/20/2017  . Cough 08/20/2017  . Rheumatoid arthritis ([place])   . Essential hypertension   . Gout   . HLD (hyperlipidemia)     Past Surgical History:  Procedure Laterality Date  . APPENDECTOMY    . CARPAL TUNNEL RELEASE Right   . IR FLUORO GUIDE PORT INSERTION RIGHT  09/06/2017  . IR NEPHROSTOMY EXCHANGE RIGHT  08/31/2017  . IR NEPHROSTOMY EXCHANGE RIGHT  09/11/2017  . IR NEPHROSTOMY PLACEMENT RIGHT  08/21/2017  . IR US GUIDE VASC ACCESS RIGHT  09/06/2017  . LAPAROSCOPIC SALPINGO OOPHERECTOMY Right     OB History    No data available       Home Medications    Prior to Admission medications   Medication Sig Start Date End Date Taking? Authorizing Provider  acetaminophen (TYLENOL) 325 MG tablet Take 2 tablets (650 mg total) by mouth every 6 (six) hours as needed for mild pain (or Fever >/= 101). 09/08/17   Rosita Fire, MD  albuterol (PROVENTIL) (2.5 MG/3ML) 0.083% nebulizer solution Take 3 mLs (2.5 mg total) by nebulization every 4 (four) hours as needed for wheezing or shortness of breath. 10/02/17   Doyle Askew,  Angeline Slim, MD  allopurinol (ZYLOPRIM) 300 MG tablet Take 300 mg by mouth daily. 05/19/17   [provider]  dextromethorphan-guaiFENesin (MUCINEX DM) 30-600 MG 12hr tablet Take 1 tablet by mouth 2 (two) times daily as needed for cough. 09/08/17   Rosita Fire, MD  diphenoxylate-atropine (LOMOTIL) 2.5-0.025 MG tablet Take 1 tablet by mouth 4 (four) times daily as needed for diarrhea or loose stools. 09/08/17   Rosita Fire, MD  furosemide (LASIX) 20 MG tablet Take 2 tablets  (40 mg total) by mouth 2 (two) times daily. 10/02/17 10/02/18  Theodis Blaze, MD  ondansetron (ZOFRAN) 4 MG tablet Take 4 mg by mouth. Take one tablet every 6 hours as needed    [provider]  pantoprazole (PROTONIX) 40 MG tablet Take 1 tablet (40 mg total) by mouth daily. 09/09/17   Rosita Fire, MD  potassium chloride SA (K-DUR,KLOR-CON) 20 MEQ tablet Take 1 tablet (20 mEq total) by mouth daily. 10/03/17   Theodis Blaze, MD  simvastatin (ZOCOR) 20 MG tablet Take 20 mg by mouth daily. 08/17/17   [provider]  traMADol (ULTRAM) 50 MG tablet Take 1 tablet (50 mg total) by mouth every 6 (six) hours as needed for moderate pain or severe pain. 10/03/17   Theodis Blaze, MD    Family History Family History  Problem Relation Age of Onset  . CVA Mother     Social History Social History   Tobacco Use  . Smoking status: Former Smoker    Last attempt to quit: 08/06/2017    Years since quitting: 0.2  . Smokeless tobacco: Never Used  Substance Use Topics  . Alcohol use: No  . Drug use: No     Allergies   Streptomycin   Review of Systems Review of Systems  Constitutional: Negative for chills and fever.  Respiratory: Negative for cough and shortness of breath.   Cardiovascular: Negative for chest pain and palpitations.  Gastrointestinal: Negative for abdominal pain, nausea and vomiting.  Genitourinary: Positive for flank pain. Negative for dysuria and hematuria.  Musculoskeletal: Negative for arthralgias, back pain, myalgias, neck pain and neck stiffness.  Skin: Negative for color change, pallor and rash.  Neurological: Negative for dizziness, seizures, syncope, weakness, numbness and headaches.     Physical Exam Updated Vital Signs BP (!) 110/56 (BP Location: Right Arm)   Pulse 88   Temp 99.1 F (37.3 C) (Oral)   Resp 16   LMP  (LMP Unknown)   SpO2 96%   Physical Exam  Constitutional: She appears well-developed and well-nourished. No distress.   Afebrile, nontoxic-appearing, lying comfortably in bed in no acute distress.  HENT:  Head: Normocephalic and atraumatic.  Eyes: Conjunctivae and EOM are normal. Right eye exhibits no discharge. Left eye exhibits no discharge. No scleral icterus.  Neck: Normal range of motion.  Cardiovascular: Normal rate, regular rhythm and normal heart sounds.  No murmur heard. Pulmonary/Chest: Effort normal and breath sounds normal. No stridor. No respiratory distress. She has no wheezes.  Abdominal: Soft. She exhibits no distension and no mass. There is no tenderness. There is no guarding.  Musculoskeletal: Normal range of motion. She exhibits no edema.  Neurological: She is alert.  Skin: Skin is warm and dry. No rash noted. She is not diaphoretic. No erythema. No pallor.  No purulence or surrounding erythema at the nephrostomy tube site  Psychiatric: She has a normal mood and affect.  Nursing note and vitals reviewed.  ED Treatments / Results  Labs (all labs ordered are listed, but only abnormal results are displayed) Labs Reviewed - No data to display  EKG  EKG Interpretation None       Radiology No results found.  Procedures Procedures (including critical care time)  Medications Ordered in ED Medications - No data to display   Initial Impression / Assessment and Plan / ED Course  I have reviewed the triage vital signs and the nursing notes.  Pertinent labs & imaging results that were available during my care of the patient were reviewed by me and considered in my medical decision making (see chart for details).     Patient is a retired Marine scientist and reliable historian, she had labs drawn today at different facility and reports that everything was normal.  7:00 pm spoke to IR physician Dr. Vernard Gambles on call who recommends placing the order and IR will contact patient in the morning to have her come back tomorrow for tube exchange. No need to be NPO.   Patient is well-appearing,  nontoxic, afebrile with normal and stable vitals.  Patient is ready to go home. She will be discharged back to rehab facility and transport was arranged.   Plan to return tomorrow for tube exchange once she receives the call from IR in the morning. Discussed strict return precautions and advised to return to the emergency department if experiencing any new or worsening symptoms. Instructions were understood and patient agreed with discharge plan. Final Clinical Impressions(s) / ED Diagnoses   Final diagnoses:  Air leak  Malfunction of nephrostomy tube [facility])    ED Discharge Orders        Ordered    IR NEPHROSTOMY EXCHANGE RIGHT     10/30/17 1901       Dossie Der 10/30/17 2158    Forde Dandy, MD 10/30/17 [phone]

## 2017-10-30 NOTE — ED Triage Notes (Signed)
Pt presents to ED, states she had a 330 appointment to have her nephrostomy tube replaced.  The tube has a "nick" in it which "broke the sterile field".  Radiology states no order sent with patient and will need to be seen for order.

## 2017-10-30 NOTE — Discharge Instructions (Addendum)
As discussed, you will receive a call in the morning for return to have your tube exchange performed.  No need to be NPO per IR physician consulted today.  Return sooner if symptoms worsen in the meantime.

## 2017-10-31 ENCOUNTER — Other Ambulatory Visit (HOSPITAL_COMMUNITY): Payer: Self-pay | Admitting: Urology

## 2017-10-31 DIAGNOSIS — N1330 Unspecified hydronephrosis: Secondary | ICD-10-CM

## 2017-11-01 DIAGNOSIS — C8590 Non-Hodgkin lymphoma, unspecified, unspecified site: Secondary | ICD-10-CM | POA: Diagnosis not present

## 2017-11-01 DIAGNOSIS — N1339 Other hydronephrosis: Secondary | ICD-10-CM | POA: Diagnosis not present

## 2017-11-01 DIAGNOSIS — Z8579 Personal history of other malignant neoplasms of lymphoid, hematopoietic and related tissues: Secondary | ICD-10-CM | POA: Diagnosis not present

## 2017-11-01 DIAGNOSIS — M069 Rheumatoid arthritis, unspecified: Secondary | ICD-10-CM | POA: Diagnosis not present

## 2017-11-01 DIAGNOSIS — C8330 Diffuse large B-cell lymphoma, unspecified site: Secondary | ICD-10-CM | POA: Diagnosis not present

## 2017-11-01 DIAGNOSIS — T83092A Other mechanical complication of nephrostomy catheter, initial encounter: Secondary | ICD-10-CM | POA: Diagnosis not present

## 2017-11-01 DIAGNOSIS — Z466 Encounter for fitting and adjustment of urinary device: Secondary | ICD-10-CM | POA: Diagnosis not present

## 2017-11-01 DIAGNOSIS — Z5111 Encounter for antineoplastic chemotherapy: Secondary | ICD-10-CM | POA: Diagnosis not present

## 2017-11-02 ENCOUNTER — Ambulatory Visit (HOSPITAL_COMMUNITY): Admission: RE | Admit: 2017-11-02 | Payer: Medicare Other | Source: Ambulatory Visit

## 2017-11-07 DIAGNOSIS — R609 Edema, unspecified: Secondary | ICD-10-CM | POA: Diagnosis not present

## 2017-11-07 DIAGNOSIS — E46 Unspecified protein-calorie malnutrition: Secondary | ICD-10-CM | POA: Diagnosis not present

## 2017-11-07 DIAGNOSIS — C8330 Diffuse large B-cell lymphoma, unspecified site: Secondary | ICD-10-CM | POA: Diagnosis not present

## 2017-11-07 DIAGNOSIS — C8338 Diffuse large B-cell lymphoma, lymph nodes of multiple sites: Secondary | ICD-10-CM | POA: Diagnosis not present

## 2017-11-15 ENCOUNTER — Other Ambulatory Visit (HOSPITAL_COMMUNITY): Payer: Self-pay | Admitting: Urology

## 2017-11-15 DIAGNOSIS — N1330 Unspecified hydronephrosis: Secondary | ICD-10-CM | POA: Diagnosis not present

## 2017-11-15 DIAGNOSIS — N131 Hydronephrosis with ureteral stricture, not elsewhere classified: Secondary | ICD-10-CM | POA: Diagnosis not present

## 2017-11-15 DIAGNOSIS — N135 Crossing vessel and stricture of ureter without hydronephrosis: Secondary | ICD-10-CM

## 2017-11-16 ENCOUNTER — Other Ambulatory Visit: Payer: Self-pay | Admitting: Radiology

## 2017-11-16 DIAGNOSIS — C8330 Diffuse large B-cell lymphoma, unspecified site: Secondary | ICD-10-CM | POA: Diagnosis not present

## 2017-11-16 DIAGNOSIS — D61810 Antineoplastic chemotherapy induced pancytopenia: Secondary | ICD-10-CM | POA: Diagnosis not present

## 2017-11-16 DIAGNOSIS — I959 Hypotension, unspecified: Secondary | ICD-10-CM | POA: Diagnosis not present

## 2017-11-16 DIAGNOSIS — N139 Obstructive and reflux uropathy, unspecified: Secondary | ICD-10-CM | POA: Diagnosis not present

## 2017-11-19 ENCOUNTER — Ambulatory Visit (HOSPITAL_COMMUNITY)
Admission: RE | Admit: 2017-11-19 | Discharge: 2017-11-19 | Disposition: A | Discharge status: Home / Self Care | Payer: Medicare Other | Source: Ambulatory Visit | Attending: Urology | Admitting: Urology

## 2017-11-19 ENCOUNTER — Encounter (HOSPITAL_COMMUNITY): Payer: Self-pay

## 2017-11-19 DIAGNOSIS — M109 Gout, unspecified: Secondary | ICD-10-CM | POA: Insufficient documentation

## 2017-11-19 DIAGNOSIS — E785 Hyperlipidemia, unspecified: Secondary | ICD-10-CM | POA: Insufficient documentation

## 2017-11-19 DIAGNOSIS — M069 Rheumatoid arthritis, unspecified: Secondary | ICD-10-CM | POA: Insufficient documentation

## 2017-11-19 DIAGNOSIS — N1330 Unspecified hydronephrosis: Secondary | ICD-10-CM | POA: Diagnosis not present

## 2017-11-19 DIAGNOSIS — Z8572 Personal history of non-Hodgkin lymphomas: Secondary | ICD-10-CM | POA: Diagnosis not present

## 2017-11-19 DIAGNOSIS — C8590 Non-Hodgkin lymphoma, unspecified, unspecified site: Secondary | ICD-10-CM | POA: Diagnosis not present

## 2017-11-19 DIAGNOSIS — I10 Essential (primary) hypertension: Secondary | ICD-10-CM | POA: Insufficient documentation

## 2017-11-19 DIAGNOSIS — N135 Crossing vessel and stricture of ureter without hydronephrosis: Secondary | ICD-10-CM

## 2017-11-19 DIAGNOSIS — Z936 Other artificial openings of urinary tract status: Secondary | ICD-10-CM | POA: Diagnosis not present

## 2017-11-19 HISTORY — PX: IR URETERAL STENT PLACEMENT EXISTING ACCESS RIGHT: IMG6074

## 2017-11-19 LAB — BASIC METABOLIC PANEL
Anion gap: 11 (ref 5–15)
BUN: 13 mg/dL (ref 6–20)
CHLORIDE: 95 mmol/L — AB (ref 101–111)
CO2: 27 mmol/L (ref 22–32)
CREATININE: 0.58 mg/dL (ref 0.44–1.00)
Calcium: 8.7 mg/dL — ABNORMAL LOW (ref 8.9–10.3)
GFR calc non Af Amer: >60 mL/min (ref >60)
Glucose, Bld: 125 mg/dL — ABNORMAL HIGH (ref 65–99)
Potassium: 2.7 mmol/L — CL (ref 3.5–5.1)
SODIUM: 133 mmol/L — AB (ref 135–145)

## 2017-11-19 LAB — CBC WITH DIFFERENTIAL/PLATELET
Band Neutrophils: 7 %
Basophils Absolute: 0 10*3/uL (ref 0.0–0.1)
Basophils Relative: 0 %
Eosinophils Absolute: 0 10*3/uL (ref 0.0–0.7)
Eosinophils Relative: 0 %
HCT: 23.1 % — ABNORMAL LOW (ref 36.0–46.0)
Hemoglobin: 7.9 g/dL — ABNORMAL LOW (ref 12.0–15.0)
Lymphocytes Relative: 5 %
Lymphs Abs: 0.5 10*3/uL — ABNORMAL LOW (ref 0.7–4.0)
MCH: 34.5 pg — ABNORMAL HIGH (ref 26.0–34.0)
MCHC: 34.2 g/dL (ref 30.0–36.0)
MCV: 100.9 fL — ABNORMAL HIGH (ref 78.0–100.0)
Metamyelocytes Relative: 4 %
Monocytes Absolute: 0.9 10*3/uL (ref 0.1–1.0)
Monocytes Relative: 9 %
Myelocytes: 7 %
Neutro Abs: 8.5 10*3/uL — ABNORMAL HIGH (ref 1.7–7.7)
Neutrophils Relative %: 67 %
Platelets: 237 10*3/uL (ref 150–400)
Promyelocytes Absolute: 1 %
RBC: 2.29 MIL/uL — ABNORMAL LOW (ref 3.87–5.11)
RDW: 21.5 % — ABNORMAL HIGH (ref 11.5–15.5)
WBC: 9.9 10*3/uL (ref 4.0–10.5)
nRBC: 2 /100 WBC — ABNORMAL HIGH

## 2017-11-19 LAB — PROTIME-INR
INR: 1.09
Prothrombin Time: 14.1 seconds (ref 11.4–15.2)

## 2017-11-19 MED ORDER — MIDAZOLAM HCL 2 MG/2ML IJ SOLN
INTRAMUSCULAR | Status: AC
  Filled 2017-11-19: qty 2

## 2017-11-19 MED ORDER — IOPAMIDOL (ISOVUE-300) INJECTION 61%
50.0000 mL | Freq: Once | INTRAVENOUS | Status: AC | PRN
  Administered 2017-11-19: 20 mL

## 2017-11-19 MED ORDER — MIDAZOLAM HCL 2 MG/2ML IJ SOLN
INTRAMUSCULAR | Status: DC | PRN
  Administered 2017-11-19 (×2): 1 mg via INTRAVENOUS

## 2017-11-19 MED ORDER — CIPROFLOXACIN IN D5W 400 MG/200ML IV SOLN
INTRAVENOUS | Status: AC
  Filled 2017-11-19: qty 200

## 2017-11-19 MED ORDER — CIPROFLOXACIN IN D5W 400 MG/200ML IV SOLN
400.0000 mg | INTRAVENOUS | Status: AC
Start: 2017-11-19 — End: 2017-11-19
  Administered 2017-11-19: 400 mg via INTRAVENOUS

## 2017-11-19 MED ORDER — LIDOCAINE-EPINEPHRINE (PF) 2 %-1:200000 IJ SOLN
INTRAMUSCULAR | Status: AC
  Filled 2017-11-19: qty 20

## 2017-11-19 MED ORDER — FENTANYL CITRATE (PF) 100 MCG/2ML IJ SOLN
INTRAMUSCULAR | Status: AC
  Filled 2017-11-19: qty 2

## 2017-11-19 MED ORDER — FENTANYL CITRATE (PF) 100 MCG/2ML IJ SOLN
INTRAMUSCULAR | Status: DC | PRN
  Administered 2017-11-19 (×2): 50 ug via INTRAVENOUS

## 2017-11-19 MED ORDER — POTASSIUM CHLORIDE CRYS ER 20 MEQ PO TBCR
40.0000 meq | EXTENDED_RELEASE_TABLET | Freq: Once | ORAL | Status: AC
  Administered 2017-11-19: 40 meq via ORAL
  Filled 2017-11-19: qty 2

## 2017-11-19 MED ORDER — HEPARIN SOD (PORK) LOCK FLUSH 100 UNIT/ML IV SOLN
500.0000 [IU] | INTRAVENOUS | Status: AC | PRN
  Administered 2017-11-19: 500 [IU]
  Filled 2017-11-19: qty 5

## 2017-11-19 MED ORDER — SODIUM CHLORIDE 0.9 % IV SOLN
INTRAVENOUS | Status: DC
  Administered 2017-11-19: 13:00:00 via INTRAVENOUS

## 2017-11-19 MED ORDER — IOPAMIDOL (ISOVUE-300) INJECTION 61%
INTRAVENOUS | Status: AC
  Administered 2017-11-19: 20 mL
  Filled 2017-11-19: qty 50

## 2017-11-19 NOTE — Discharge Instructions (Signed)
Ureteral Stent Implantation, Care After °Refer to this sheet in the next few weeks. These instructions provide you with information about caring for yourself after your procedure. Your health care provider may also give you more specific instructions. Your treatment has been planned according to current medical practices, but problems sometimes occur. Call your health care provider if you have any problems or questions after your procedure. °What can I expect after the procedure? °After the procedure, it is common to have: °· Nausea. °· Mild pain when you urinate. You may feel this pain in your lower back or lower abdomen. Pain should stop within a few minutes after you urinate. This may last for up to 1 week. °· A small amount of blood in your urine for several days. ° °Follow these instructions at home: ° °Medicines °· Take over-the-counter and prescription medicines only as told by your health care provider. °· If you were prescribed an antibiotic medicine, take it as told by your health care provider. Do not stop taking the antibiotic even if you start to feel better. °· Do not drive for 24 hours if you received a sedative. °· Do not drive or operate heavy machinery while taking prescription pain medicines. °Activity °· Return to your normal activities as told by your health care provider. Ask your health care provider what activities are safe for you. °· Do not lift anything that is heavier than 10 lb (4.5 kg). Follow this limit for 1 week after your procedure, or for as long as told by your health care provider. °General instructions °· Watch for any blood in your urine. Call your health care provider if the amount of blood in your urine increases. °· If you have a catheter: °? Follow instructions from your health care provider about taking care of your catheter and collection bag. °? Do not take baths, swim, or use a hot tub until your health care provider approves. °· Drink enough fluid to keep your urine  clear or pale yellow. °· Keep all follow-up visits as told by your health care provider. This is important. °Contact a health care provider if: °· You have pain that gets worse or does not get better with medicine, especially pain when you urinate. °· You have difficulty urinating. °· You feel nauseous or you vomit repeatedly during a period of more than 2 days after the procedure. °Get help right away if: °· Your urine is dark red or has blood clots in it. °· You are leaking urine (have incontinence). °· The end of the stent comes out of your urethra. °· You cannot urinate. °· You have sudden, sharp, or severe pain in your abdomen or lower back. °· You have a fever. °This information is not intended to replace advice given to you by your health care provider. Make sure you discuss any questions you have with your health care provider. °Document Released: 08/06/2013 Document Revised: 05/11/2016 Document Reviewed: 06/18/2015 °Elsevier Interactive Patient Education © 2018 [employer] ° °Moderate Conscious Sedation, Adult, Care After °These instructions provide you with information about caring for yourself after your procedure. Your health care provider may also give you more specific instructions. Your treatment has been planned according to current medical practices, but problems sometimes occur. Call your health care provider if you have any problems or questions after your procedure. °What can I expect after the procedure? °After your procedure, it is common: °· To feel sleepy for several hours. °· To feel clumsy and have poor balance   for several hours. °· To have poor judgment for several hours. °· To vomit if you eat too soon. ° °Follow these instructions at home: °For at least 24 hours after the procedure: ° °· Do not: °? Participate in activities where you could fall or become injured. °? Drive. °? Use heavy machinery. °? Drink alcohol. °? Take sleeping pills or medicines that cause drowsiness. °? Make  important decisions or sign legal documents. °? Take care of children on your own. °· Rest. °Eating and drinking °· Follow the diet recommended by your health care provider. °· If you vomit: °? Drink water, juice, or soup when you can drink without vomiting. °? Make sure you have little or no nausea before eating solid foods. °General instructions °· Have a responsible adult stay with you until you are awake and alert. °· Take over-the-counter and prescription medicines only as told by your health care provider. °· If you smoke, do not smoke without supervision. °· Keep all follow-up visits as told by your health care provider. This is important. °Contact a health care provider if: °· You keep feeling nauseous or you keep vomiting. °· You feel light-headed. °· You develop a rash. °· You have a fever. °Get help right away if: °· You have trouble breathing. °This information is not intended to replace advice given to you by your health care provider. Make sure you discuss any questions you have with your health care provider. °Document Released: 09/24/2013 Document Revised: 05/08/2016 Document Reviewed: 03/25/2016 °Elsevier Interactive Patient Education © 2018 [employer] Inc[employer] ° °

## 2017-11-19 NOTE — Progress Notes (Signed)
CRITICAL VALUE ALERT  Critical Value:  KCL 2.7  Date & Time Notied:  11/19/17 13:46  Provider Notified: Warnell Bureau, PA  Orders Received/Actions taken: ordered 40 mEq Potassium po

## 2017-11-19 NOTE — Sedation Documentation (Signed)
Pt re-medicated for pain.

## 2017-11-19 NOTE — Sedation Documentation (Signed)
Patient is resting comfortably. 

## 2017-11-19 NOTE — H&P (Signed)
Referring Physician(s): Kathie Rhodes  Supervising Physician: Sandi Mariscal  Patient Status:  [facility]  Chief Complaint:  Lymphoma, right hydronephrosis  Subjective: Patient familiar to IR service from prior right nephrostomy on 08/21/17, retroperitoneal lymph node biopsy on 08/23/17, left supraclavicular lymph node biopsy on 08/24/17, bone marrow biopsy on 08/31/17, right nephrostomy exchange on 08/31/17 and 09/11/17, Port-A-Cath placement on 09/06/17 and left thoracentesis on 09/20/17.  She has a history of diffuse large B-cell lymphoma with right-sided hydronephrosis secondary to bulky retroperitoneal lymphadenopathy.  She presents again today for right nephrostogram with possible ureteral stent placement.  He currently denies fever, headache, chest pain, dyspnea, cough, abdominal pain, nausea, vomiting.  She does have some soreness at right nephrostomy insertion site and occasional nosebleeds.  Past Medical History:  Diagnosis Date  . Acute urinary retention 08/29/2017  . AKI (acute kidney injury) ([place]) 08/20/2017  . Cytopenia   . DLBCL (diffuse large B cell lymphoma) ([place]) 09/03/2017  . Essential hypertension   . Gout   . HLD (hyperlipidemia)   . Hyperbilirubinemia 08/29/2017  . Hypercalcemia 08/20/2017  . Hypomagnesemia 08/29/2017  . Hypophosphatemia 08/29/2017  . Hypotension 08/20/2017  . Lymphadenopathy   . Macrocytic anemia 08/21/2017  . Nausea & vomiting 08/20/2017  . Rheumatoid arthritis ([place])   . Sepsis ([place]) 08/20/2017  . Thrombocytopenia ([place]) 08/20/2017   Past Surgical History:  Procedure Laterality Date  . APPENDECTOMY    . CARPAL TUNNEL RELEASE Right   . IR FLUORO GUIDE PORT INSERTION RIGHT  09/06/2017  . IR NEPHROSTOMY EXCHANGE RIGHT  08/31/2017  . IR NEPHROSTOMY EXCHANGE RIGHT  09/11/2017  . IR NEPHROSTOMY PLACEMENT RIGHT  08/21/2017  . IR US GUIDE VASC ACCESS RIGHT  09/06/2017  . LAPAROSCOPIC SALPINGO OOPHERECTOMY Right      Allergies: Streptomycin  Medications: Prior to Admission  medications   Medication Sig Start Date End Date Taking? Authorizing Provider  allopurinol (ZYLOPRIM) 300 MG tablet Take 300 mg by mouth daily. 05/19/17  Yes [provider]  furosemide (LASIX) 20 MG tablet Take 2 tablets (40 mg total) by mouth 2 (two) times daily. 10/02/17 10/02/18 Yes Theodis Blaze, MD  potassium chloride SA (K-DUR,KLOR-CON) 20 MEQ tablet Take 1 tablet (20 mEq total) by mouth daily. 10/03/17  Yes Theodis Blaze, MD  simvastatin (ZOCOR) 20 MG tablet Take 20 mg by mouth daily. 08/17/17  Yes [provider]  traMADol (ULTRAM) 50 MG tablet Take 1 tablet (50 mg total) by mouth every 6 (six) hours as needed for moderate pain or severe pain. 10/03/17  Yes Theodis Blaze, MD  acetaminophen (TYLENOL) 325 MG tablet Take 2 tablets (650 mg total) by mouth every 6 (six) hours as needed for mild pain (or Fever >/= 101). 09/08/17   Rosita Fire, MD  albuterol (PROVENTIL) (2.5 MG/3ML) 0.083% nebulizer solution Take 3 mLs (2.5 mg total) by nebulization every 4 (four) hours as needed for wheezing or shortness of breath. 10/02/17   Theodis Blaze, MD  dextromethorphan-guaiFENesin [facility] DM) 30-600 MG 12hr tablet Take 1 tablet by mouth 2 (two) times daily as needed for cough. 09/08/17   Rosita Fire, MD  diphenoxylate-atropine (LOMOTIL) 2.5-0.025 MG tablet Take 1 tablet by mouth 4 (four) times daily as needed for diarrhea or loose stools. 09/08/17   Rosita Fire, MD  ondansetron (ZOFRAN) 4 MG tablet Take 4 mg by mouth. Take one tablet every 6 hours as needed    [provider]  pantoprazole (PROTONIX) 40 MG tablet  Take 1 tablet (40 mg total) by mouth daily. 09/09/17   Rosita Fire, MD     Vital Signs: BP (!) 163/64 (BP Location: Right Arm)   Pulse 82   Temp (!) 97.4 F (36.3 C) (Oral)   Resp 18   LMP  (LMP Unknown)   SpO2 100%   Physical Exam awake, alert.  Chest clear to auscultation bilaterally.  Clean, intact right chest wall  Port-A-Cath.  Heart with regular rate and rhythm.  Abdomen soft, positive bowel sounds, nontender.  Right percutaneous nephrostomy intact, dressing dry, yellow urine in bag, insertion site mildly tender.  Trace left lower extremity pretibial edema, no right lower extremity edema.  Imaging: No results found.  Labs:  CBC: Recent Labs    09/30/17 0432 10/01/17 0459 10/01/17 2230 10/02/17 0558 10/03/17 0500  WBC 4.9 4.6  --  3.4* 9.3  HGB 7.8* 7.6* 9.3* 9.4* 8.8*  HCT 23.2* 22.5* 27.1* 27.6* 25.8*  PLT 87* 63*  --  50* 35*    COAGS: Recent Labs    08/20/17 2255 08/30/17 1447 09/03/17 1018 09/11/17 1415 09/15/17 0535 11/19/17 1250  INR 1.16 1.23 1.19 1.17 1.23 1.09  APTT _0 --   --     BMP: Recent Labs    09/30/17 0432 10/01/17 0459 10/02/17 0558 10/03/17 0500  NA 139 139 139 136  K 4.0 4.1 3.9 3.8  CL 104 103 101 98*  CO2 _1 GLUCOSE 100* 97 89 99  BUN 47* 43* 38* 31*  CALCIUM 7.7* 7.9* 8.3* 8.2*  CREATININE 0.56 0.53 0.47 0.50  GFRNONAA >60 >60 >60 >60  GFRAA >60 >60 >60 >60    LIVER FUNCTION TESTS: Recent Labs    09/14/17 0303 09/15/17 0500 09/16/17 0431 09/25/17 0523  BILITOT 1.5* 1.7* 1.7* 0.8  AST _2 ALT 30 30 32 27  ALKPHOS 56 53 59 121  PROT <3.0* 3.0* 3.2* 4.2*  ALBUMIN 1.5* 1.6* 1.7* 1.8*    Assessment and Plan: Pt with history of diffuse large B-cell lymphoma with right-sided hydronephrosis secondary to bulky retroperitoneal lymphadenopathy, s/p right PCN on 08/21/17 with last exchange on 09/11/17.  She presents again today for right nephrostogram with possible ureteral stent placement.  Details/risks of procedure, including but not limited to, internal bleeding, infection, injury to adjacent structures, inability to place ureteral stent and need for continued nephrostomy drainage discussed with patient /husband with their understanding and consent. Labs pend.   Electronically Signed: D. Rowe Robert,  PA-C 11/19/2017, 1:20 PM   I spent a total of 20 minutes at the the patient's bedside AND on the patient's hospital floor or unit, greater than 50% of which was counseling/coordinating care for right nephrostogram with possible ureteral stent placement

## 2017-11-19 NOTE — Procedures (Signed)
Pre Procedure Dx: Hydronephrosis Post Procedure Dx: Same  Successful placement of an antegrade right sided ureteral stent. Successful fluoro guided right sided PCN exchange.   PCN was capped for a trial of internalization.  EBL: None   No immediate complications.   Ronny Bacon, MD Pager #: [phone]

## 2017-11-21 ENCOUNTER — Encounter (HOSPITAL_COMMUNITY): Payer: Self-pay | Admitting: Interventional Radiology

## 2017-11-22 DIAGNOSIS — C8339 Diffuse large B-cell lymphoma, extranodal and solid organ sites: Secondary | ICD-10-CM | POA: Diagnosis not present

## 2017-11-22 DIAGNOSIS — C8330 Diffuse large B-cell lymphoma, unspecified site: Secondary | ICD-10-CM | POA: Diagnosis not present

## 2017-11-22 DIAGNOSIS — Z436 Encounter for attention to other artificial openings of urinary tract: Secondary | ICD-10-CM | POA: Diagnosis not present

## 2017-11-22 DIAGNOSIS — C8590 Non-Hodgkin lymphoma, unspecified, unspecified site: Secondary | ICD-10-CM | POA: Diagnosis not present

## 2017-11-28 DIAGNOSIS — E46 Unspecified protein-calorie malnutrition: Secondary | ICD-10-CM

## 2017-11-28 DIAGNOSIS — C8338 Diffuse large B-cell lymphoma, lymph nodes of multiple sites: Secondary | ICD-10-CM

## 2017-11-28 DIAGNOSIS — C8330 Diffuse large B-cell lymphoma, unspecified site: Secondary | ICD-10-CM

## 2017-11-28 DIAGNOSIS — Z5189 Encounter for other specified aftercare: Secondary | ICD-10-CM | POA: Diagnosis not present

## 2017-11-28 DIAGNOSIS — I361 Nonrheumatic tricuspid (valve) insufficiency: Secondary | ICD-10-CM

## 2017-11-28 DIAGNOSIS — D6481 Anemia due to antineoplastic chemotherapy: Secondary | ICD-10-CM

## 2017-11-28 DIAGNOSIS — E876 Hypokalemia: Secondary | ICD-10-CM

## 2017-11-29 DIAGNOSIS — D61810 Antineoplastic chemotherapy induced pancytopenia: Secondary | ICD-10-CM | POA: Diagnosis not present

## 2017-11-29 DIAGNOSIS — N139 Obstructive and reflux uropathy, unspecified: Secondary | ICD-10-CM | POA: Diagnosis not present

## 2017-11-29 DIAGNOSIS — J9600 Acute respiratory failure, unspecified whether with hypoxia or hypercapnia: Secondary | ICD-10-CM | POA: Diagnosis not present

## 2017-11-29 DIAGNOSIS — C8330 Diffuse large B-cell lymphoma, unspecified site: Secondary | ICD-10-CM | POA: Diagnosis not present

## 2017-12-04 DIAGNOSIS — M109 Gout, unspecified: Secondary | ICD-10-CM | POA: Diagnosis not present

## 2017-12-04 DIAGNOSIS — M069 Rheumatoid arthritis, unspecified: Secondary | ICD-10-CM | POA: Diagnosis not present

## 2017-12-04 DIAGNOSIS — G629 Polyneuropathy, unspecified: Secondary | ICD-10-CM | POA: Diagnosis not present

## 2017-12-04 DIAGNOSIS — N139 Obstructive and reflux uropathy, unspecified: Secondary | ICD-10-CM | POA: Diagnosis not present

## 2017-12-04 DIAGNOSIS — K5790 Diverticulosis of intestine, part unspecified, without perforation or abscess without bleeding: Secondary | ICD-10-CM | POA: Diagnosis not present

## 2017-12-04 DIAGNOSIS — D630 Anemia in neoplastic disease: Secondary | ICD-10-CM | POA: Diagnosis not present

## 2017-12-04 DIAGNOSIS — I10 Essential (primary) hypertension: Secondary | ICD-10-CM | POA: Diagnosis not present

## 2017-12-04 DIAGNOSIS — C8338 Diffuse large B-cell lymphoma, lymph nodes of multiple sites: Secondary | ICD-10-CM | POA: Diagnosis not present

## 2017-12-05 DIAGNOSIS — D630 Anemia in neoplastic disease: Secondary | ICD-10-CM | POA: Diagnosis not present

## 2017-12-05 DIAGNOSIS — C8338 Diffuse large B-cell lymphoma, lymph nodes of multiple sites: Secondary | ICD-10-CM | POA: Diagnosis not present

## 2017-12-06 DIAGNOSIS — C8338 Diffuse large B-cell lymphoma, lymph nodes of multiple sites: Secondary | ICD-10-CM | POA: Diagnosis not present

## 2017-12-06 DIAGNOSIS — D630 Anemia in neoplastic disease: Secondary | ICD-10-CM | POA: Diagnosis not present

## 2017-12-07 DIAGNOSIS — D630 Anemia in neoplastic disease: Secondary | ICD-10-CM | POA: Diagnosis not present

## 2017-12-07 DIAGNOSIS — C8338 Diffuse large B-cell lymphoma, lymph nodes of multiple sites: Secondary | ICD-10-CM | POA: Diagnosis not present

## 2017-12-10 DIAGNOSIS — D630 Anemia in neoplastic disease: Secondary | ICD-10-CM | POA: Diagnosis not present

## 2017-12-10 DIAGNOSIS — C8338 Diffuse large B-cell lymphoma, lymph nodes of multiple sites: Secondary | ICD-10-CM | POA: Diagnosis not present

## 2017-12-12 DIAGNOSIS — D630 Anemia in neoplastic disease: Secondary | ICD-10-CM | POA: Diagnosis not present

## 2017-12-12 DIAGNOSIS — C8338 Diffuse large B-cell lymphoma, lymph nodes of multiple sites: Secondary | ICD-10-CM | POA: Diagnosis not present

## 2017-12-13 DIAGNOSIS — C8330 Diffuse large B-cell lymphoma, unspecified site: Secondary | ICD-10-CM | POA: Diagnosis not present

## 2017-12-13 DIAGNOSIS — Z79899 Other long term (current) drug therapy: Secondary | ICD-10-CM | POA: Diagnosis not present

## 2017-12-17 DIAGNOSIS — D630 Anemia in neoplastic disease: Secondary | ICD-10-CM | POA: Diagnosis not present

## 2017-12-17 DIAGNOSIS — C8338 Diffuse large B-cell lymphoma, lymph nodes of multiple sites: Secondary | ICD-10-CM | POA: Diagnosis not present

## 2017-12-18 DIAGNOSIS — D630 Anemia in neoplastic disease: Secondary | ICD-10-CM | POA: Diagnosis not present

## 2017-12-18 DIAGNOSIS — C8338 Diffuse large B-cell lymphoma, lymph nodes of multiple sites: Secondary | ICD-10-CM | POA: Diagnosis not present

## 2017-12-19 DIAGNOSIS — D6481 Anemia due to antineoplastic chemotherapy: Secondary | ICD-10-CM | POA: Diagnosis not present

## 2017-12-19 DIAGNOSIS — C8330 Diffuse large B-cell lymphoma, unspecified site: Secondary | ICD-10-CM | POA: Diagnosis not present

## 2017-12-20 DIAGNOSIS — C8330 Diffuse large B-cell lymphoma, unspecified site: Secondary | ICD-10-CM | POA: Diagnosis not present

## 2017-12-20 DIAGNOSIS — D6481 Anemia due to antineoplastic chemotherapy: Secondary | ICD-10-CM | POA: Diagnosis not present

## 2017-12-21 DIAGNOSIS — C8338 Diffuse large B-cell lymphoma, lymph nodes of multiple sites: Secondary | ICD-10-CM | POA: Diagnosis not present

## 2017-12-21 DIAGNOSIS — D630 Anemia in neoplastic disease: Secondary | ICD-10-CM | POA: Diagnosis not present

## 2017-12-24 DIAGNOSIS — C8338 Diffuse large B-cell lymphoma, lymph nodes of multiple sites: Secondary | ICD-10-CM | POA: Diagnosis not present

## 2017-12-24 DIAGNOSIS — D630 Anemia in neoplastic disease: Secondary | ICD-10-CM | POA: Diagnosis not present

## 2017-12-25 DIAGNOSIS — D630 Anemia in neoplastic disease: Secondary | ICD-10-CM | POA: Diagnosis not present

## 2017-12-25 DIAGNOSIS — N390 Urinary tract infection, site not specified: Secondary | ICD-10-CM | POA: Diagnosis not present

## 2017-12-25 DIAGNOSIS — C8338 Diffuse large B-cell lymphoma, lymph nodes of multiple sites: Secondary | ICD-10-CM | POA: Diagnosis not present

## 2017-12-27 DIAGNOSIS — C8338 Diffuse large B-cell lymphoma, lymph nodes of multiple sites: Secondary | ICD-10-CM | POA: Diagnosis not present

## 2017-12-27 DIAGNOSIS — D630 Anemia in neoplastic disease: Secondary | ICD-10-CM | POA: Diagnosis not present

## 2017-12-28 DIAGNOSIS — C8338 Diffuse large B-cell lymphoma, lymph nodes of multiple sites: Secondary | ICD-10-CM | POA: Diagnosis not present

## 2017-12-28 DIAGNOSIS — D630 Anemia in neoplastic disease: Secondary | ICD-10-CM | POA: Diagnosis not present

## 2017-12-31 DIAGNOSIS — C8338 Diffuse large B-cell lymphoma, lymph nodes of multiple sites: Secondary | ICD-10-CM | POA: Diagnosis not present

## 2017-12-31 DIAGNOSIS — D630 Anemia in neoplastic disease: Secondary | ICD-10-CM | POA: Diagnosis not present

## 2018-01-01 DIAGNOSIS — I959 Hypotension, unspecified: Secondary | ICD-10-CM | POA: Diagnosis not present

## 2018-01-01 DIAGNOSIS — K121 Other forms of stomatitis: Secondary | ICD-10-CM | POA: Diagnosis not present

## 2018-01-01 DIAGNOSIS — C8330 Diffuse large B-cell lymphoma, unspecified site: Secondary | ICD-10-CM | POA: Diagnosis not present

## 2018-01-01 DIAGNOSIS — N1330 Unspecified hydronephrosis: Secondary | ICD-10-CM | POA: Diagnosis not present

## 2018-01-01 DIAGNOSIS — C8338 Diffuse large B-cell lymphoma, lymph nodes of multiple sites: Secondary | ICD-10-CM | POA: Diagnosis not present

## 2018-01-01 DIAGNOSIS — R5383 Other fatigue: Secondary | ICD-10-CM | POA: Diagnosis not present

## 2018-01-01 DIAGNOSIS — E876 Hypokalemia: Secondary | ICD-10-CM | POA: Diagnosis not present

## 2018-01-01 DIAGNOSIS — D649 Anemia, unspecified: Secondary | ICD-10-CM | POA: Diagnosis not present

## 2018-01-01 DIAGNOSIS — D630 Anemia in neoplastic disease: Secondary | ICD-10-CM | POA: Diagnosis not present

## 2018-01-02 DIAGNOSIS — D630 Anemia in neoplastic disease: Secondary | ICD-10-CM | POA: Diagnosis not present

## 2018-01-02 DIAGNOSIS — C8338 Diffuse large B-cell lymphoma, lymph nodes of multiple sites: Secondary | ICD-10-CM | POA: Diagnosis not present

## 2018-01-03 DIAGNOSIS — N390 Urinary tract infection, site not specified: Secondary | ICD-10-CM | POA: Diagnosis not present

## 2018-01-03 DIAGNOSIS — C8510 Unspecified B-cell lymphoma, unspecified site: Secondary | ICD-10-CM | POA: Diagnosis not present

## 2018-01-03 DIAGNOSIS — Z5111 Encounter for antineoplastic chemotherapy: Secondary | ICD-10-CM | POA: Diagnosis not present

## 2018-01-03 DIAGNOSIS — C8330 Diffuse large B-cell lymphoma, unspecified site: Secondary | ICD-10-CM | POA: Diagnosis not present

## 2018-01-03 DIAGNOSIS — D630 Anemia in neoplastic disease: Secondary | ICD-10-CM | POA: Diagnosis not present

## 2018-01-03 DIAGNOSIS — C8338 Diffuse large B-cell lymphoma, lymph nodes of multiple sites: Secondary | ICD-10-CM | POA: Diagnosis not present

## 2018-01-03 DIAGNOSIS — D6481 Anemia due to antineoplastic chemotherapy: Secondary | ICD-10-CM | POA: Diagnosis not present

## 2018-01-08 DIAGNOSIS — H43811 Vitreous degeneration, right eye: Secondary | ICD-10-CM | POA: Diagnosis not present

## 2018-01-08 DIAGNOSIS — H2513 Age-related nuclear cataract, bilateral: Secondary | ICD-10-CM | POA: Diagnosis not present

## 2018-01-08 DIAGNOSIS — C8338 Diffuse large B-cell lymphoma, lymph nodes of multiple sites: Secondary | ICD-10-CM | POA: Diagnosis not present

## 2018-01-08 DIAGNOSIS — D630 Anemia in neoplastic disease: Secondary | ICD-10-CM | POA: Diagnosis not present

## 2018-01-09 DIAGNOSIS — D630 Anemia in neoplastic disease: Secondary | ICD-10-CM | POA: Diagnosis not present

## 2018-01-09 DIAGNOSIS — C8338 Diffuse large B-cell lymphoma, lymph nodes of multiple sites: Secondary | ICD-10-CM | POA: Diagnosis not present

## 2018-01-11 DIAGNOSIS — D630 Anemia in neoplastic disease: Secondary | ICD-10-CM | POA: Diagnosis not present

## 2018-01-11 DIAGNOSIS — C8338 Diffuse large B-cell lymphoma, lymph nodes of multiple sites: Secondary | ICD-10-CM | POA: Diagnosis not present

## 2018-01-13 DIAGNOSIS — I10 Essential (primary) hypertension: Secondary | ICD-10-CM | POA: Diagnosis not present

## 2018-01-13 DIAGNOSIS — N1330 Unspecified hydronephrosis: Secondary | ICD-10-CM | POA: Diagnosis not present

## 2018-01-13 DIAGNOSIS — C8338 Diffuse large B-cell lymphoma, lymph nodes of multiple sites: Secondary | ICD-10-CM | POA: Diagnosis not present

## 2018-01-13 DIAGNOSIS — M109 Gout, unspecified: Secondary | ICD-10-CM | POA: Diagnosis not present

## 2018-01-13 DIAGNOSIS — N139 Obstructive and reflux uropathy, unspecified: Secondary | ICD-10-CM | POA: Diagnosis not present

## 2018-01-13 DIAGNOSIS — D630 Anemia in neoplastic disease: Secondary | ICD-10-CM | POA: Diagnosis not present

## 2018-01-13 DIAGNOSIS — M069 Rheumatoid arthritis, unspecified: Secondary | ICD-10-CM | POA: Diagnosis not present

## 2018-01-13 DIAGNOSIS — G629 Polyneuropathy, unspecified: Secondary | ICD-10-CM | POA: Diagnosis not present

## 2018-01-15 DIAGNOSIS — D630 Anemia in neoplastic disease: Secondary | ICD-10-CM | POA: Diagnosis not present

## 2018-01-15 DIAGNOSIS — C8338 Diffuse large B-cell lymphoma, lymph nodes of multiple sites: Secondary | ICD-10-CM | POA: Diagnosis not present

## 2018-01-17 DIAGNOSIS — C8338 Diffuse large B-cell lymphoma, lymph nodes of multiple sites: Secondary | ICD-10-CM | POA: Diagnosis not present

## 2018-01-17 DIAGNOSIS — D630 Anemia in neoplastic disease: Secondary | ICD-10-CM | POA: Diagnosis not present

## 2018-01-18 DIAGNOSIS — C8338 Diffuse large B-cell lymphoma, lymph nodes of multiple sites: Secondary | ICD-10-CM | POA: Diagnosis not present

## 2018-01-18 DIAGNOSIS — D630 Anemia in neoplastic disease: Secondary | ICD-10-CM | POA: Diagnosis not present

## 2018-01-21 DIAGNOSIS — N390 Urinary tract infection, site not specified: Secondary | ICD-10-CM | POA: Diagnosis not present

## 2018-01-21 DIAGNOSIS — I10 Essential (primary) hypertension: Secondary | ICD-10-CM | POA: Diagnosis not present

## 2018-01-21 DIAGNOSIS — M069 Rheumatoid arthritis, unspecified: Secondary | ICD-10-CM | POA: Diagnosis not present

## 2018-01-21 DIAGNOSIS — M109 Gout, unspecified: Secondary | ICD-10-CM | POA: Diagnosis not present

## 2018-01-21 DIAGNOSIS — C8338 Diffuse large B-cell lymphoma, lymph nodes of multiple sites: Secondary | ICD-10-CM | POA: Diagnosis not present

## 2018-01-21 DIAGNOSIS — G629 Polyneuropathy, unspecified: Secondary | ICD-10-CM | POA: Diagnosis not present

## 2018-01-21 DIAGNOSIS — D630 Anemia in neoplastic disease: Secondary | ICD-10-CM | POA: Diagnosis not present

## 2018-01-21 DIAGNOSIS — N139 Obstructive and reflux uropathy, unspecified: Secondary | ICD-10-CM | POA: Diagnosis not present

## 2018-01-23 DIAGNOSIS — D630 Anemia in neoplastic disease: Secondary | ICD-10-CM | POA: Diagnosis not present

## 2018-01-23 DIAGNOSIS — C8338 Diffuse large B-cell lymphoma, lymph nodes of multiple sites: Secondary | ICD-10-CM | POA: Diagnosis not present

## 2018-01-25 DIAGNOSIS — D630 Anemia in neoplastic disease: Secondary | ICD-10-CM | POA: Diagnosis not present

## 2018-01-25 DIAGNOSIS — C8338 Diffuse large B-cell lymphoma, lymph nodes of multiple sites: Secondary | ICD-10-CM | POA: Diagnosis not present

## 2018-01-28 DIAGNOSIS — C8338 Diffuse large B-cell lymphoma, lymph nodes of multiple sites: Secondary | ICD-10-CM | POA: Diagnosis not present

## 2018-01-28 DIAGNOSIS — D630 Anemia in neoplastic disease: Secondary | ICD-10-CM | POA: Diagnosis not present

## 2018-01-29 DIAGNOSIS — Z5111 Encounter for antineoplastic chemotherapy: Secondary | ICD-10-CM | POA: Diagnosis not present

## 2018-01-29 DIAGNOSIS — C8512 Unspecified B-cell lymphoma, intrathoracic lymph nodes: Secondary | ICD-10-CM | POA: Diagnosis not present

## 2018-01-29 DIAGNOSIS — C8330 Diffuse large B-cell lymphoma, unspecified site: Secondary | ICD-10-CM | POA: Diagnosis not present

## 2018-01-29 DIAGNOSIS — N289 Disorder of kidney and ureter, unspecified: Secondary | ICD-10-CM | POA: Diagnosis not present

## 2018-01-31 DIAGNOSIS — Z9221 Personal history of antineoplastic chemotherapy: Secondary | ICD-10-CM | POA: Diagnosis not present

## 2018-01-31 DIAGNOSIS — E876 Hypokalemia: Secondary | ICD-10-CM | POA: Diagnosis not present

## 2018-01-31 DIAGNOSIS — C8338 Diffuse large B-cell lymphoma, lymph nodes of multiple sites: Secondary | ICD-10-CM | POA: Diagnosis not present

## 2018-01-31 DIAGNOSIS — D630 Anemia in neoplastic disease: Secondary | ICD-10-CM | POA: Diagnosis not present

## 2018-01-31 DIAGNOSIS — D649 Anemia, unspecified: Secondary | ICD-10-CM | POA: Diagnosis not present

## 2018-01-31 DIAGNOSIS — C8330 Diffuse large B-cell lymphoma, unspecified site: Secondary | ICD-10-CM | POA: Diagnosis not present

## 2018-02-07 DIAGNOSIS — C8590 Non-Hodgkin lymphoma, unspecified, unspecified site: Secondary | ICD-10-CM | POA: Diagnosis not present

## 2018-02-07 DIAGNOSIS — R2689 Other abnormalities of gait and mobility: Secondary | ICD-10-CM | POA: Diagnosis not present

## 2018-02-07 DIAGNOSIS — M62552 Muscle wasting and atrophy, not elsewhere classified, left thigh: Secondary | ICD-10-CM | POA: Diagnosis not present

## 2018-02-07 DIAGNOSIS — M62551 Muscle wasting and atrophy, not elsewhere classified, right thigh: Secondary | ICD-10-CM | POA: Diagnosis not present

## 2018-02-11 DIAGNOSIS — C8590 Non-Hodgkin lymphoma, unspecified, unspecified site: Secondary | ICD-10-CM | POA: Diagnosis not present

## 2018-02-11 DIAGNOSIS — R2689 Other abnormalities of gait and mobility: Secondary | ICD-10-CM | POA: Diagnosis not present

## 2018-02-11 DIAGNOSIS — M62552 Muscle wasting and atrophy, not elsewhere classified, left thigh: Secondary | ICD-10-CM | POA: Diagnosis not present

## 2018-02-11 DIAGNOSIS — M62551 Muscle wasting and atrophy, not elsewhere classified, right thigh: Secondary | ICD-10-CM | POA: Diagnosis not present

## 2018-02-12 DIAGNOSIS — R8279 Other abnormal findings on microbiological examination of urine: Secondary | ICD-10-CM | POA: Diagnosis not present

## 2018-02-12 DIAGNOSIS — N1330 Unspecified hydronephrosis: Secondary | ICD-10-CM | POA: Diagnosis not present

## 2018-02-12 DIAGNOSIS — N131 Hydronephrosis with ureteral stricture, not elsewhere classified: Secondary | ICD-10-CM | POA: Diagnosis not present

## 2018-02-13 ENCOUNTER — Other Ambulatory Visit: Payer: Self-pay | Admitting: Urology

## 2018-02-14 ENCOUNTER — Other Ambulatory Visit: Payer: Self-pay

## 2018-02-14 ENCOUNTER — Encounter (HOSPITAL_BASED_OUTPATIENT_CLINIC_OR_DEPARTMENT_OTHER): Payer: Self-pay | Admitting: *Deleted

## 2018-02-14 DIAGNOSIS — M62551 Muscle wasting and atrophy, not elsewhere classified, right thigh: Secondary | ICD-10-CM | POA: Diagnosis not present

## 2018-02-14 DIAGNOSIS — R2689 Other abnormalities of gait and mobility: Secondary | ICD-10-CM | POA: Diagnosis not present

## 2018-02-14 DIAGNOSIS — C8590 Non-Hodgkin lymphoma, unspecified, unspecified site: Secondary | ICD-10-CM | POA: Diagnosis not present

## 2018-02-14 DIAGNOSIS — M62552 Muscle wasting and atrophy, not elsewhere classified, left thigh: Secondary | ICD-10-CM | POA: Diagnosis not present

## 2018-02-14 NOTE — Progress Notes (Signed)
Spoke with: Saachi Npo after midnight arrive 530 am 02-18-18 [facility] meds to take sip of water : allopurinol, pyridium HAS SURGERY ORDERS Labs on chart: 01-29-18 cbc with dif, and cmet [facility] ECHO 08-30-17 CHART/EPIC EKG 09-14-17 CHART/EPIC lov DR Hosie Poisson MD 12-19-17 ONCOLOGY [place] ON CHART DRIVER Lazarus Gowda [facility] CELL [phone]

## 2018-02-15 DIAGNOSIS — M62551 Muscle wasting and atrophy, not elsewhere classified, right thigh: Secondary | ICD-10-CM | POA: Diagnosis not present

## 2018-02-15 DIAGNOSIS — Z9289 Personal history of other medical treatment: Secondary | ICD-10-CM

## 2018-02-15 DIAGNOSIS — R2689 Other abnormalities of gait and mobility: Secondary | ICD-10-CM | POA: Diagnosis not present

## 2018-02-15 DIAGNOSIS — C8590 Non-Hodgkin lymphoma, unspecified, unspecified site: Secondary | ICD-10-CM | POA: Diagnosis not present

## 2018-02-15 DIAGNOSIS — M62552 Muscle wasting and atrophy, not elsewhere classified, left thigh: Secondary | ICD-10-CM | POA: Diagnosis not present

## 2018-02-15 HISTORY — DX: Personal history of other medical treatment: Z92.89

## 2018-02-18 ENCOUNTER — Encounter (HOSPITAL_BASED_OUTPATIENT_CLINIC_OR_DEPARTMENT_OTHER): Payer: Self-pay

## 2018-02-18 ENCOUNTER — Ambulatory Visit (HOSPITAL_BASED_OUTPATIENT_CLINIC_OR_DEPARTMENT_OTHER): Payer: Medicare Other | Admitting: Anesthesiology

## 2018-02-18 ENCOUNTER — Encounter (HOSPITAL_BASED_OUTPATIENT_CLINIC_OR_DEPARTMENT_OTHER)
Admission: RE | Disposition: A | Discharge status: Home / Self Care | Payer: Self-pay | Source: Ambulatory Visit | Attending: Urology

## 2018-02-18 ENCOUNTER — Ambulatory Visit (HOSPITAL_BASED_OUTPATIENT_CLINIC_OR_DEPARTMENT_OTHER)
Admission: RE | Admit: 2018-02-18 | Discharge: 2018-02-18 | Disposition: A | Discharge status: Home / Self Care | Payer: Medicare Other | Source: Ambulatory Visit | Attending: Urology | Admitting: Urology

## 2018-02-18 DIAGNOSIS — Z79899 Other long term (current) drug therapy: Secondary | ICD-10-CM | POA: Insufficient documentation

## 2018-02-18 DIAGNOSIS — N135 Crossing vessel and stricture of ureter without hydronephrosis: Secondary | ICD-10-CM

## 2018-02-18 DIAGNOSIS — M069 Rheumatoid arthritis, unspecified: Secondary | ICD-10-CM | POA: Diagnosis not present

## 2018-02-18 DIAGNOSIS — D696 Thrombocytopenia, unspecified: Secondary | ICD-10-CM | POA: Diagnosis not present

## 2018-02-18 DIAGNOSIS — M109 Gout, unspecified: Secondary | ICD-10-CM | POA: Insufficient documentation

## 2018-02-18 DIAGNOSIS — N136 Pyonephrosis: Secondary | ICD-10-CM | POA: Insufficient documentation

## 2018-02-18 DIAGNOSIS — N390 Urinary tract infection, site not specified: Secondary | ICD-10-CM | POA: Diagnosis not present

## 2018-02-18 DIAGNOSIS — N1330 Unspecified hydronephrosis: Secondary | ICD-10-CM | POA: Diagnosis not present

## 2018-02-18 DIAGNOSIS — E7800 Pure hypercholesterolemia, unspecified: Secondary | ICD-10-CM | POA: Diagnosis not present

## 2018-02-18 DIAGNOSIS — Z87891 Personal history of nicotine dependence: Secondary | ICD-10-CM | POA: Insufficient documentation

## 2018-02-18 DIAGNOSIS — G932 Benign intracranial hypertension: Secondary | ICD-10-CM | POA: Diagnosis not present

## 2018-02-18 DIAGNOSIS — Z466 Encounter for fitting and adjustment of urinary device: Secondary | ICD-10-CM | POA: Diagnosis not present

## 2018-02-18 DIAGNOSIS — C669 Malignant neoplasm of unspecified ureter: Secondary | ICD-10-CM | POA: Diagnosis not present

## 2018-02-18 HISTORY — DX: Personal history of antineoplastic chemotherapy: Z92.21

## 2018-02-18 HISTORY — PX: CYSTOSCOPY W/ URETERAL STENT PLACEMENT: SHX1429

## 2018-02-18 HISTORY — DX: Myoneural disorder, unspecified: G70.9

## 2018-02-18 HISTORY — DX: Unspecified cataract: H26.9

## 2018-02-18 HISTORY — DX: Other specified soft tissue disorders: M79.89

## 2018-02-18 SURGERY — CYSTOSCOPY, WITH RETROGRADE PYELOGRAM AND URETERAL STENT INSERTION
Anesthesia: General | Laterality: Right

## 2018-02-18 MED ORDER — ONDANSETRON HCL 4 MG/2ML IJ SOLN
INTRAMUSCULAR | Status: AC
  Filled 2018-02-18: qty 2

## 2018-02-18 MED ORDER — CIPROFLOXACIN IN D5W 400 MG/200ML IV SOLN
400.0000 mg | Freq: Once | INTRAVENOUS | Status: AC
  Administered 2018-02-18: 400 mg via INTRAVENOUS
  Filled 2018-02-18: qty 200

## 2018-02-18 MED ORDER — LIDOCAINE 2% (20 MG/ML) 5 ML SYRINGE
INTRAMUSCULAR | Status: DC | PRN
  Administered 2018-02-18: 50 mg via INTRAVENOUS

## 2018-02-18 MED ORDER — PROPOFOL 10 MG/ML IV BOLUS
INTRAVENOUS | Status: AC
  Filled 2018-02-18: qty 40

## 2018-02-18 MED ORDER — CIPROFLOXACIN IN D5W 400 MG/200ML IV SOLN
INTRAVENOUS | Status: AC
  Filled 2018-02-18: qty 200

## 2018-02-18 MED ORDER — FENTANYL CITRATE (PF) 100 MCG/2ML IJ SOLN
INTRAMUSCULAR | Status: AC
  Filled 2018-02-18: qty 2

## 2018-02-18 MED ORDER — ONDANSETRON HCL 4 MG/2ML IJ SOLN
INTRAMUSCULAR | Status: DC | PRN
  Administered 2018-02-18: 4 mg via INTRAVENOUS

## 2018-02-18 MED ORDER — KETOROLAC TROMETHAMINE 30 MG/ML IJ SOLN
INTRAMUSCULAR | Status: DC | PRN
  Administered 2018-02-18: 30 mg via INTRAVENOUS

## 2018-02-18 MED ORDER — SODIUM CHLORIDE 0.9 % IV SOLN
INTRAVENOUS | Status: DC
  Administered 2018-02-18: 06:00:00 via INTRAVENOUS
  Filled 2018-02-18: qty 1000

## 2018-02-18 MED ORDER — FENTANYL CITRATE (PF) 100 MCG/2ML IJ SOLN
25.0000 ug | INTRAMUSCULAR | Status: DC | PRN
  Filled 2018-02-18: qty 1

## 2018-02-18 MED ORDER — KETOROLAC TROMETHAMINE 30 MG/ML IJ SOLN
INTRAMUSCULAR | Status: AC
  Filled 2018-02-18: qty 1

## 2018-02-18 MED ORDER — DEXAMETHASONE SODIUM PHOSPHATE 10 MG/ML IJ SOLN
INTRAMUSCULAR | Status: AC
  Filled 2018-02-18: qty 1

## 2018-02-18 MED ORDER — LIDOCAINE 2% (20 MG/ML) 5 ML SYRINGE
INTRAMUSCULAR | Status: AC
  Filled 2018-02-18: qty 5

## 2018-02-18 MED ORDER — ARTIFICIAL TEARS OPHTHALMIC OINT
TOPICAL_OINTMENT | OPHTHALMIC | Status: AC
  Filled 2018-02-18: qty 3.5

## 2018-02-18 MED ORDER — METOCLOPRAMIDE HCL 5 MG/ML IJ SOLN
10.0000 mg | Freq: Once | INTRAMUSCULAR | Status: DC | PRN
  Filled 2018-02-18: qty 2

## 2018-02-18 MED ORDER — MIDAZOLAM HCL 2 MG/2ML IJ SOLN
INTRAMUSCULAR | Status: AC
  Filled 2018-02-18: qty 2

## 2018-02-18 MED ORDER — DEXAMETHASONE SODIUM PHOSPHATE 10 MG/ML IJ SOLN
INTRAMUSCULAR | Status: DC | PRN
  Administered 2018-02-18: 10 mg via INTRAVENOUS

## 2018-02-18 MED ORDER — PROPOFOL 10 MG/ML IV BOLUS
INTRAVENOUS | Status: DC | PRN
  Administered 2018-02-18: 110 mg via INTRAVENOUS

## 2018-02-18 MED ORDER — MEPERIDINE HCL 25 MG/ML IJ SOLN
6.2500 mg | INTRAMUSCULAR | Status: DC | PRN
  Filled 2018-02-18: qty 1

## 2018-02-18 MED ORDER — SODIUM CHLORIDE 0.9 % IR SOLN
Status: DC | PRN
  Administered 2018-02-18: 3000 mL via INTRAVESICAL
  Administered 2018-02-18: 1000 mL via INTRAVESICAL

## 2018-02-18 MED ORDER — IOHEXOL 300 MG/ML  SOLN
INTRAMUSCULAR | Status: DC | PRN
  Administered 2018-02-18: 20 mL via URETHRAL

## 2018-02-18 MED ORDER — FENTANYL CITRATE (PF) 100 MCG/2ML IJ SOLN
INTRAMUSCULAR | Status: DC | PRN
  Administered 2018-02-18 (×2): 50 ug via INTRAVENOUS

## 2018-02-18 SURGICAL SUPPLY — 23 items
BAG DRAIN URO-CYSTO SKYTR STRL (DRAIN) ×2 IMPLANT
BASKET ZERO TIP NITINOL 2.4FR (BASKET) IMPLANT
CATH INTERMIT  6FR 70CM (CATHETERS) IMPLANT
CATH URET 5FR 28IN CONE TIP (BALLOONS)
CATH URET 5FR 70CM CONE TIP (BALLOONS) IMPLANT
CLOTH BEACON ORANGE TIMEOUT ST (SAFETY) ×2 IMPLANT
EXTRACTOR STONE 1.7FRX115CM (UROLOGICAL SUPPLIES) IMPLANT
FIBER LASER FLEXIVA 365 (UROLOGICAL SUPPLIES) IMPLANT
FIBER LASER TRAC TIP (UROLOGICAL SUPPLIES) IMPLANT
GLOVE BIO SURGEON STRL SZ8 (GLOVE) ×2 IMPLANT
GOWN STRL REUS W/TWL XL LVL3 (GOWN DISPOSABLE) ×2 IMPLANT
GUIDEWIRE 0.038 PTFE COATED (WIRE) IMPLANT
GUIDEWIRE ANG ZIPWIRE 038X150 (WIRE) IMPLANT
GUIDEWIRE STR DUAL SENSOR (WIRE) ×2 IMPLANT
INFUSOR MANOMETER BAG 3000ML (MISCELLANEOUS) ×2 IMPLANT
IV NS IRRIG 3000ML ARTHROMATIC (IV SOLUTION) ×4 IMPLANT
KIT TURNOVER CYSTO (KITS) ×2 IMPLANT
MANIFOLD NEPTUNE II (INSTRUMENTS) IMPLANT
NS IRRIG 500ML POUR BTL (IV SOLUTION) ×2 IMPLANT
PACK CYSTO (CUSTOM PROCEDURE TRAY) ×2 IMPLANT
STENT URET 6FRX24 CONTOUR (STENTS) ×2 IMPLANT
TUBE CONNECTING 12X1/4 (SUCTIONS) IMPLANT
WATER STERILE IRR 3000ML UROMA (IV SOLUTION) IMPLANT

## 2018-02-18 NOTE — Discharge Instructions (Signed)
Post stent placement instructions   Definitions:  Ureter: The duct that transports urine from the kidney to the bladder. Stent: A plastic hollow tube that is placed into the ureter, from the kidney to the bladder to prevent the ureter from swelling shut.  General instructions:  Despite the fact that no skin incisions were used, the area around the ureter and bladder is raw and irritated. The stent is a foreign body which can further irritate the bladder wall. This irritation is manifested by increased frequency of urination, both day and night, and by an increase in the urge to urinate. In some, the urge to urinate is present almost always. Sometimes the urge is strong enough that you may not be able to stop your self from urinating. This can often be controlled with medication but does not occur in everyone. A stent can safely be left in place for 3 months or greater.  You may see some blood in your urine while the stent is in place and a few days afterward. Do not be alarmed, even if the urine is clear for a while. Get off your feet and drink lots of fluids until clearing occurs. If you start to pass clots or don't improve, call us.  Diet:  You may return to your normal diet immediately. Because of the raw surface of your bladder, alcohol, spicy foods, foods high in acid and drinks with caffeine may cause irritation or frequency and should be used in moderation. To keep your urine flowing freely and avoid constipation, drink plenty of fluids during the day (8-10 glasses). Tip: Avoid cranberry juice because it is very acidic.  Activity:  Your physical activity doesn't need to be restricted. However, if you are very active, you may see some blood in the urine. We suggest that you reduce your activity under the circumstances until the bleeding has stopped.  Bowels:  It is important to keep your bowels regular during the postoperative period. Straining with bowel movements can cause bleeding. A  bowel movement every other day is reasonable. Use a mild laxative if needed, such as milk of magnesia 2-3 tablespoons, or 2 Dulcolax tablets. Call if you continue to have problems. If you had been taking narcotics for pain, before, during or after your surgery, you may be constipated. Take a laxative if necessary.  Medication:  You should resume your pre-surgery medications unless told not to. In addition you may be given an antibiotic to prevent or treat infection. Antibiotics are not always necessary. All medication should be taken as prescribed until the bottles are finished unless you are having an unusual reaction to one of the drugs.  Problems you should report to us:  a. Fever greater than 101F. b. Heavy bleeding, or clots (see notes above about blood in urine). c. Inability to urinate. d. Drug reactions (hives, rash, nausea, vomiting, diarrhea). e. Severe burning or pain with urination that is not improving.       Post Anesthesia Home Care Instructions  Activity: Get plenty of rest for the remainder of the day. A responsible individual must stay with you for 24 hours following the procedure.  For the next 24 hours, DO NOT: -Drive a car -Operate machinery -Drink alcoholic beverages -Take any medication unless instructed by your physician -Make any legal decisions or sign important papers.  Meals: Start with liquid foods such as gelatin or soup. Progress to regular foods as tolerated. Avoid greasy, spicy, heavy foods. If nausea and/or vomiting occur, drink only clear   and/or vomiting subsides. Call your physician if vomiting continues.  Special Instructions/Symptoms: Your throat may feel dry or sore from the anesthesia or the breathing tube placed in your throat during surgery. If this causes discomfort, gargle with warm salt water. The discomfort should disappear within 24 hours.  If you had a scopolamine patch placed behind your ear for the management of  post- operative nausea and/or vomiting:  1. The medication in the patch is effective for 72 hours, after which it should be removed.  Wrap patch in a tissue and discard in the trash. Wash hands thoroughly with soap and water. 2. You may remove the patch earlier than 72 hours if you experience unpleasant side effects which may include dry mouth, dizziness or visual disturbances. 3. Avoid touching the patch. Wash your hands with soap and water after contact with the patch.        

## 2018-02-18 NOTE — Op Note (Signed)
PATIENT:  Kristina Torres  PRE-OPERATIVE DIAGNOSIS: Malignant right ureteral obstruction  POST-OPERATIVE DIAGNOSIS: Same  PROCEDURE: 1.  Cystoscopy with right ureteral stent removal. 2.  Right retrograde pyelogram with interpretation. 3.  Right double-J stent placement. 4.  Fluoroscopy time less than 1 hour.  SURGEON:  Claybon Jabs  INDICATION: Shadae Reino is a 72 year old female who was found to have right hydronephrosis in 8/18.  This was secondary to obstruction due to biopsy-proven diffuse large B cell lymphoma with resulting right retroperitoneal lymphadenopathy.  She was initially managed with a right nephrostomy tube and then converted to a double-J stent which is now been in about 3 months so she is brought to the operating room for exchange.  We discussed performing a retrograde to determine if a stent was still necessary.  ANESTHESIA:  General  EBL:  Minimal  DRAINS: 6 French, 24 cm double-J stent in the right ureter (no string)  LOCAL MEDICATIONS USED:  None  SPECIMEN: None  Description of procedure: After informed consent the patient was taken to the operating room and placed on the table in a supine position. General anesthesia was then administered. Once fully anesthetized the patient was moved to the dorsal lithotomy position and the genitalia were sterilely prepped and draped in standard fashion. An official timeout was then performed.  The 21 French cystoscope with 30 degree lens was passed into the bladder.  The urine was noted to be Pyridium stained.  The bladder was fully and systematically inspected.  There were no tumors, stones or worrisome lesions.  There were inflammatory changes seen surrounding the right ureteral orifice with the stent exiting.  There was no significant encrustation of the stent noted cystoscopically.  The stent was therefore grasped with alligator forceps and removed completely.  The cystoscope was reinserted and a 6 [place] open-ended  ureteral catheter was then passed through the scope and into the right ureteral orifice.  I then injected full-strength Omnipaque contrast under direct fluoroscopy to perform a right retrograde pyelogram which revealed 2 areas of narrowing of the ureter.  The first was at the upper border of the pelvic brim.  This seem to be the most significant and did not allow contrast easy passage beyond that location.  I passed the open-ended catheter through that area and injected further contrast noting the ureter proximal to this was slightly dilated.  There was also an area just below the UPJ in the proximal ureter that also appeared relatively narrowed.  The intrarenal collecting system appeared normal.  A 0.038 inch sensor guidewire was then passed through the open-ended catheter and into the right renal pelvis under fluoroscopy.  This was left in place and the open-ended catheter removed.  A new double-J stent was then passed over the guidewire into the area the renal pelvis and as the guidewire was removed good curl was noted in the right kidney and in the bladder cystoscopically.  The bladder was then drained, the cystoscope was removed and the patient was awakened and taken to the recovery room in stable and satisfactory condition.  She tolerated the procedure well and there were no intraoperative complications.  At this point, because her stent appeared to have minimal encrustation after about 3 months what I recommended is she return to the office in 4 months for an ultrasound and KUB and if all looks good I could potentially lengthen the duration in between stent changes over time.    PLAN OF CARE: Discharge to home after PACU  PATIENT DISPOSITION:  PACU - hemodynamically stable.

## 2018-02-18 NOTE — H&P (Signed)
HPI: Kristina Torres is a 72 year-old female with right ureteral obstruction.  Her problem was discovered 08/14/2017. Marland Kitchen She had the following imaging studies done: Renal Ultrasound and CT Scan.   She is not currently having flank pain, back pain, groin pain, nausea, vomiting, fever or chills.   She has not had kidney surgery. She has had a stent placed in her kidney. She has not received radiation therapy. This condition would be considered of mild to moderate severity with no modifying factors or associated signs or symptoms other than as noted above.   11/15/17: She is doing well. She is currently receiving chemotherapy. She is scheduled for a CT scan to reassess things again in a week. She has her right nephrostomy tube which has been draining clear urine. She is tolerating the tube well.   02/12/18: She is doing quite well. Urologically she said she does not even know she has a stent in place. She has no voiding symptoms, flank pain or hematuria.     ALLERGIES: Streptomycin    MEDICATIONS: Allopurinol  Cipro  Hydrochlorothiazide 12.5 mg tablet  Simvastatin  Calcium 1000 + D3 1,000 mg calcium (2,500 mg)-800 unit tablet  Colace 614 mg capsule  Folic Acid  Lasix 20 mg tablet  Methotrexate 2.5 mg tablet  Ondansetron Hcl 4 mg tablet  Potassium  Pyridium  Tramadol Hcl 50 mg tablet     GU PSH: None   NON-GU PSH: Appendectomy Carpal tunnel surgery Exploratory Laparotomy Salpingo Oophorectomy    GU PMH: Ureteral obstruction (Stable), Right, She has extrinsic obstruction of her right ureter. I am going to have her nephrostomy tube internalized and then I will have her return in 3 months so we can plan to change her stent and reassess whether her ureteral obstruction has resolved. - 11/15/2017 Hydronephrosis Unspec, Right, She has hydronephrosis on the right-hand side I do see enough renal parenchyma that this is likely not long-standing but further evaluation will be undertaken  using a CT scan with and without contrast which can help me evaluate for the presence of calculi, intrinsic lesions or extrinsic compression. - 08/16/2017      PMH Notes: Right ureteral obstruction:  She was seen in my office with new right hydronephrosis. At that time I had recommended cystoscopy with left retrograde pyelogram and possible ureteroscopy in order to determine the cause of her obstruction.  She was subsequently admitted to the hospital and underwent a right percutaneous nephrostomy tube placement. In addition she has been found to have what appears to be a lymphoproliferative disorder with significant abdominal adenopathy.  At the time of nephrostomy tube change she underwent an antegrade nephrostogram.This study revealed that the site of her obstruction is in the mid ureteral region at the pelvic brim with the CT scan revealing a tapering, normal ureter distal to this. There was no "goblet sign"to suggest an intraluminal cause of her obstruction however this study revealed a tapering ureter consistent with extrinsic compression. In addition her urine was noted be free of any red cells which would also be consistent with extrinsic compression rather than any intrinsic ureteral lesion.  We then discussed The fact that her ureteral obstruction can be managed over the longer term with internalization of her nephrostomy tube by the placement of a double-J stent in an anterograde fashion by interventional radiology with removal of her nephrostomy tube. I can then manage her stent changes as an outpatient with the eventual hope that the obstruction will respond to chemotherapy and  her stent can be eventually removed.      NON-GU PMH: Benign intracranial hypertension Diverticulosis Gout Hypercholesterolemia Rheumatoid Arthritis    FAMILY HISTORY: 1 Daughter - Daughter 1 son - Son   SOCIAL HISTORY: Marital Status: Married Preferred Language: English Current Smoking Status: Patient does  not smoke anymore.   Tobacco Use Assessment Completed: Used Tobacco in last 30 days? Drinks 3 caffeinated drinks per day.    REVIEW OF SYSTEMS:    GU Review Female:   Patient reports frequent urination, hard to postpone urination, leakage of urine, and have to strain to urinate. Patient denies burning /pain with urination, trouble starting your stream, get up at night to urinate, stream starts and stops, and being pregnant.  Gastrointestinal (Upper):   Patient denies nausea, vomiting, and indigestion/ heartburn.  Gastrointestinal (Lower):   Patient denies diarrhea and constipation.  Constitutional:   Patient denies fever, night sweats, weight loss, and fatigue.  Skin:   Patient denies skin rash/ lesion and itching.  Eyes:   Patient denies blurred vision and double vision.  Ears/ Nose/ Throat:   Patient denies sore throat and sinus problems.  Hematologic/Lymphatic:   Patient denies swollen glands and easy bruising.  Cardiovascular:   Patient reports leg swelling. Patient denies chest pains.  Respiratory:   Patient denies cough and shortness of breath.  Endocrine:   Patient denies excessive thirst.  Musculoskeletal:   Patient denies back pain and joint pain.  Neurological:   Patient denies headaches and dizziness.  Psychologic:   Patient denies depression and anxiety.   VITAL SIGNS:    Weight 139 lb / 63.05 kg  Height 59 in / 149.86 cm  BP 109/73 mmHg  Pulse 87 /min  Temperature 98.0 F / 36.6 C  BMI 28.1 kg/m   MULTI-SYSTEM PHYSICAL EXAMINATION:    Constitutional: Well-nourished. No physical deformities. Normally developed. Good grooming.  Neck: Neck symmetrical, not swollen. Normal tracheal position.  Respiratory: No labored breathing, no use of accessory muscles.   Cardiovascular: Normal temperature, normal extremity pulses, no swelling, no varicosities.  Lymphatic: No enlargement of neck, axillae, groin.  Skin: No paleness, no jaundice, no cyanosis. No lesion, no ulcer, no rash.   Neurologic / Psychiatric: Oriented to time, oriented to place, oriented to person. No depression, no anxiety, no agitation.  Gastrointestinal: No mass, no tenderness, no rigidity, non obese abdomen.  Eyes: Normal conjunctivae. Normal eyelids.  Ears, Nose, Mouth, and Throat: Left ear no scars, no lesions, no masses. Right ear no scars, no lesions, no masses. Nose no scars, no lesions, no masses. Normal hearing. Normal lips.  Musculoskeletal: Normal gait and station of head and neck.    PAST DATA REVIEWED:  Source Of History:  Patient  Lab Test Review:   BUN/Creatinine  Records Review:   Previous Patient Records, POC Tool  X-Ray Review: KUB: Reviewed Films. Previous KUB images were reviewed and compared with today's study. C.T. Abdomen: Reviewed Films. She had a contrasted study of her abdomen on 01/29/18 which revealed symmetric function without evidence of obstruction of her ureter.   Notes:                     Her creatinine was noted to be normal at 0.77 on 01/01/18   PROCEDURES:         Renal Ultrasound (Limited) - 08657  Right Kidney: Length: 9.2 cm Depth: 4.5 cm Cortical Width: 0.9 cm Width: 4.7 cm    Right Kidney/Ureter:  ? Cortical thinning  noted. --- ? minimal fullness noted. ---- A stent is visualized.   Bladder:  PVR = 103.4 ml. ----- A stent was visualized.      Her right renal ultrasound revealed some slight fullness of the renal pelvis but her bladder had 103 cc.          KUB - [id number]  A single view of the abdomen is obtained.      Independent review of her KUB reveals her stent is in good position. I can see no incrustation of the proximal or distal loop.         Urinalysis w/Scope Dipstick Dipstick Cont'd Micro  Color: Orange Bilirubin: Invalid WBC/hpf: >60/hpf  Appearance: Cloudy Ketones: Invalid RBC/hpf: 10 - 20/hpf  Specific Gravity: Invalid Blood: Invalid Bacteria: Few (10-25/hpf)  pH: Invalid Protein: Invalid Cystals: NS (Not Seen)  Glucose: Invalid  Urobilinogen: Invalid Casts: NS (Not Seen)    Nitrites: Invalid Trichomonas: Not Present    Leukocyte Esterase: Invalid Mucous: Not Present      Epithelial Cells: 0 - 5/hpf      Yeast: NS (Not Seen)      Sperm: Not Present    Notes: microscopic done on unspun specimen    ASSESSMENT/PLAN:      ICD-10 Details  1 GU:   Hydronephrosis Unspec - N13.30 Right, Stable - Her hydronephrosis has resolved with stent drainage and I would note that she drains her kidney on her contrasted CT scan.  2   Ureteral obstruction - N13.1 Right, Stable - She had extrinsic ureteral obstruction secondary to diffuse B-cell lymphoma and has undergone chemotherapy. We discussed stent change and the fact that hopefully with the chemotherapy her lymphoma will have regressed enough that she is no longer obstructed but I told her I would assess that at the time of her stent change.  3 NON-GU:   Pyuria/other UA findings - R82.79 In preparation for her surgery I cultured her urine.  The culture grew mixed gram-positive flora.  She was empirically placed on Macrodantin.

## 2018-02-18 NOTE — Anesthesia Procedure Notes (Signed)
Procedure Name: LMA Insertion Date/Time: 02/18/2018 7:35 AM Performed by: Wanita Chamberlain, CRNA Pre-anesthesia Checklist: Patient identified, Timeout performed, Emergency Drugs available, Suction available and Patient being monitored Patient Re-evaluated:Patient Re-evaluated prior to induction Oxygen Delivery Method: Circle system utilized Preoxygenation: Pre-oxygenation with 100% oxygen Induction Type: IV induction Ventilation: Mask ventilation without difficulty LMA: LMA inserted LMA Size: 4.0 Number of attempts: 1 Airway Equipment and Method: Bite block (gauze) Placement Confirmation: breath sounds checked- equal and bilateral,  CO2 detector and positive ETCO2 Tube secured with: Tape Dental Injury: Teeth and Oropharynx as per pre-operative assessment

## 2018-02-18 NOTE — Anesthesia Postprocedure Evaluation (Signed)
Anesthesia Post Note  Patient: Kristina Torres  Procedure(s) Performed: CYSTOSCOPY WITH RETROGRADE PYELOGRAM/URETERAL STENT EXCHANGE (Right )     Patient location during evaluation: PACU Anesthesia Type: General Level of consciousness: awake and alert Pain management: pain level controlled Vital Signs Assessment: post-procedure vital signs reviewed and stable Respiratory status: spontaneous breathing, nonlabored ventilation, respiratory function stable and patient connected to nasal cannula oxygen Cardiovascular status: blood pressure returned to baseline and stable Postop Assessment: no apparent nausea or vomiting Anesthetic complications: no    Last Vitals:  Vitals:   02/18/18 0830 02/18/18 0955  BP: (!) 100/46 109/60  Pulse: 73   Resp: 17 18  Temp:  36.7 C  SpO2: 94% 95%    Last Pain:  Vitals:   02/18/18 0541  TempSrc: Oral                 [name]

## 2018-02-18 NOTE — Anesthesia Preprocedure Evaluation (Addendum)
Anesthesia Evaluation  Patient identified by MRN, date of birth, ID band Patient awake    Reviewed: Allergy & Precautions, NPO status , Patient's Chart, lab work & pertinent test results  Airway Mallampati: II  TM Distance: >3 FB Neck ROM: Full    Dental no notable dental hx. (+) Dental Advisory Given, Teeth Intact,    Pulmonary neg pulmonary ROS, former smoker,    Pulmonary exam normal breath sounds clear to auscultation       Cardiovascular hypertension, Pt. on medications Normal cardiovascular exam Rhythm:Regular Rate:Normal  08-30-17 2 D ECHO The cavity size was normal. Wall thickness was normal. Systolic function was normal. The estimated ejection fraction was in the range of 60% to 65%.   Neuro/Psych negative neurological ROS  negative psych ROS   GI/Hepatic negative GI ROS, Neg liver ROS,   Endo/Other  negative endocrine ROS  Renal/GU negative Renal ROS  negative genitourinary   Musculoskeletal negative musculoskeletal ROS (+) Arthritis , Osteoarthritis,    Abdominal   Peds negative pediatric ROS (+)  Hematology negative hematology ROS (+) anemia , NHL   Anesthesia Other Findings   Reproductive/Obstetrics negative OB ROS                         Anesthesia Physical Anesthesia Plan  ASA: III  Anesthesia Plan: General   Post-op Pain Management:    Induction: Intravenous  PONV Risk Score and Plan: 3 and Ondansetron, Treatment may vary due to age or medical condition and Dexamethasone  Airway Management Planned: LMA  Additional Equipment:   Intra-op Plan:   Post-operative Plan:   Informed Consent: I have reviewed the patients History and Physical, chart, labs and discussed the procedure including the risks, benefits and alternatives for the proposed anesthesia with the patient or authorized representative who has indicated his/her understanding and acceptance.   Dental  advisory given  Plan Discussed with: CRNA  Anesthesia Plan Comments: (No Versed)        Anesthesia Quick Evaluation

## 2018-02-18 NOTE — Transfer of Care (Signed)
Immediate Anesthesia Transfer of Care Note  Patient: Kristina Torres  Procedure(s) Performed: CYSTOSCOPY WITH RETROGRADE PYELOGRAM/URETERAL STENT EXCHANGE (Right )  Patient Location: PACU  Anesthesia Type:General  Level of Consciousness: awake, alert , oriented and patient cooperative  Airway & Oxygen Therapy: Patient Spontanous Breathing and Patient connected to nasal cannula oxygen  Post-op Assessment: Report given to RN and Post -op Vital signs reviewed and stable  Post vital signs: Reviewed and stable  Last Vitals:  Vitals:   02/18/18 0541  BP: 96/60  Pulse: 88  Resp: 17  Temp: 36.8 C  SpO2: 96%    Last Pain:  Vitals:   02/18/18 0541  TempSrc: Oral      Patients Stated Pain Goal: 4 (62/83/15 1761)  Complications: No apparent anesthesia complications

## 2018-02-19 ENCOUNTER — Encounter (HOSPITAL_BASED_OUTPATIENT_CLINIC_OR_DEPARTMENT_OTHER): Payer: Self-pay | Admitting: Urology

## 2018-02-19 DIAGNOSIS — C8590 Non-Hodgkin lymphoma, unspecified, unspecified site: Secondary | ICD-10-CM | POA: Diagnosis not present

## 2018-02-19 DIAGNOSIS — M62551 Muscle wasting and atrophy, not elsewhere classified, right thigh: Secondary | ICD-10-CM | POA: Diagnosis not present

## 2018-02-19 DIAGNOSIS — R2689 Other abnormalities of gait and mobility: Secondary | ICD-10-CM | POA: Diagnosis not present

## 2018-02-19 DIAGNOSIS — M62552 Muscle wasting and atrophy, not elsewhere classified, left thigh: Secondary | ICD-10-CM | POA: Diagnosis not present

## 2018-02-19 NOTE — Addendum Note (Signed)
Addendum  created 02/19/18 1252 by [name], MD   Intraprocedure Event edited

## 2018-02-21 DIAGNOSIS — R2689 Other abnormalities of gait and mobility: Secondary | ICD-10-CM | POA: Diagnosis not present

## 2018-02-21 DIAGNOSIS — C8590 Non-Hodgkin lymphoma, unspecified, unspecified site: Secondary | ICD-10-CM | POA: Diagnosis not present

## 2018-02-21 DIAGNOSIS — M62551 Muscle wasting and atrophy, not elsewhere classified, right thigh: Secondary | ICD-10-CM | POA: Diagnosis not present

## 2018-02-21 DIAGNOSIS — M62552 Muscle wasting and atrophy, not elsewhere classified, left thigh: Secondary | ICD-10-CM | POA: Diagnosis not present

## 2018-02-22 DIAGNOSIS — Z1331 Encounter for screening for depression: Secondary | ICD-10-CM | POA: Diagnosis not present

## 2018-02-22 DIAGNOSIS — E785 Hyperlipidemia, unspecified: Secondary | ICD-10-CM | POA: Diagnosis not present

## 2018-02-22 DIAGNOSIS — M109 Gout, unspecified: Secondary | ICD-10-CM | POA: Diagnosis not present

## 2018-02-22 DIAGNOSIS — Z1389 Encounter for screening for other disorder: Secondary | ICD-10-CM | POA: Diagnosis not present

## 2018-02-22 DIAGNOSIS — Z0001 Encounter for general adult medical examination with abnormal findings: Secondary | ICD-10-CM | POA: Diagnosis not present

## 2018-02-26 DIAGNOSIS — R2689 Other abnormalities of gait and mobility: Secondary | ICD-10-CM | POA: Diagnosis not present

## 2018-02-26 DIAGNOSIS — M62552 Muscle wasting and atrophy, not elsewhere classified, left thigh: Secondary | ICD-10-CM | POA: Diagnosis not present

## 2018-02-26 DIAGNOSIS — M62551 Muscle wasting and atrophy, not elsewhere classified, right thigh: Secondary | ICD-10-CM | POA: Diagnosis not present

## 2018-02-26 DIAGNOSIS — C8590 Non-Hodgkin lymphoma, unspecified, unspecified site: Secondary | ICD-10-CM | POA: Diagnosis not present

## 2018-02-27 DIAGNOSIS — R2689 Other abnormalities of gait and mobility: Secondary | ICD-10-CM | POA: Diagnosis not present

## 2018-02-27 DIAGNOSIS — C8590 Non-Hodgkin lymphoma, unspecified, unspecified site: Secondary | ICD-10-CM | POA: Diagnosis not present

## 2018-02-27 DIAGNOSIS — M62551 Muscle wasting and atrophy, not elsewhere classified, right thigh: Secondary | ICD-10-CM | POA: Diagnosis not present

## 2018-02-27 DIAGNOSIS — M62552 Muscle wasting and atrophy, not elsewhere classified, left thigh: Secondary | ICD-10-CM | POA: Diagnosis not present

## 2018-03-01 DIAGNOSIS — R2689 Other abnormalities of gait and mobility: Secondary | ICD-10-CM | POA: Diagnosis not present

## 2018-03-01 DIAGNOSIS — C8590 Non-Hodgkin lymphoma, unspecified, unspecified site: Secondary | ICD-10-CM | POA: Diagnosis not present

## 2018-03-01 DIAGNOSIS — M62551 Muscle wasting and atrophy, not elsewhere classified, right thigh: Secondary | ICD-10-CM | POA: Diagnosis not present

## 2018-03-01 DIAGNOSIS — M62552 Muscle wasting and atrophy, not elsewhere classified, left thigh: Secondary | ICD-10-CM | POA: Diagnosis not present

## 2018-03-04 DIAGNOSIS — H25811 Combined forms of age-related cataract, right eye: Secondary | ICD-10-CM | POA: Diagnosis not present

## 2018-03-05 DIAGNOSIS — C8590 Non-Hodgkin lymphoma, unspecified, unspecified site: Secondary | ICD-10-CM | POA: Diagnosis not present

## 2018-03-05 DIAGNOSIS — M62551 Muscle wasting and atrophy, not elsewhere classified, right thigh: Secondary | ICD-10-CM | POA: Diagnosis not present

## 2018-03-05 DIAGNOSIS — R2689 Other abnormalities of gait and mobility: Secondary | ICD-10-CM | POA: Diagnosis not present

## 2018-03-05 DIAGNOSIS — M62552 Muscle wasting and atrophy, not elsewhere classified, left thigh: Secondary | ICD-10-CM | POA: Diagnosis not present

## 2018-03-07 DIAGNOSIS — R2689 Other abnormalities of gait and mobility: Secondary | ICD-10-CM | POA: Diagnosis not present

## 2018-03-07 DIAGNOSIS — M62551 Muscle wasting and atrophy, not elsewhere classified, right thigh: Secondary | ICD-10-CM | POA: Diagnosis not present

## 2018-03-07 DIAGNOSIS — M62552 Muscle wasting and atrophy, not elsewhere classified, left thigh: Secondary | ICD-10-CM | POA: Diagnosis not present

## 2018-03-07 DIAGNOSIS — C8590 Non-Hodgkin lymphoma, unspecified, unspecified site: Secondary | ICD-10-CM | POA: Diagnosis not present

## 2018-03-11 DIAGNOSIS — H25811 Combined forms of age-related cataract, right eye: Secondary | ICD-10-CM | POA: Diagnosis not present

## 2018-03-11 DIAGNOSIS — H2511 Age-related nuclear cataract, right eye: Secondary | ICD-10-CM | POA: Diagnosis not present

## 2018-03-11 DIAGNOSIS — H52201 Unspecified astigmatism, right eye: Secondary | ICD-10-CM | POA: Diagnosis not present

## 2018-03-13 DIAGNOSIS — R7989 Other specified abnormal findings of blood chemistry: Secondary | ICD-10-CM | POA: Diagnosis not present

## 2018-03-13 DIAGNOSIS — D649 Anemia, unspecified: Secondary | ICD-10-CM | POA: Diagnosis not present

## 2018-03-13 DIAGNOSIS — C8338 Diffuse large B-cell lymphoma, lymph nodes of multiple sites: Secondary | ICD-10-CM | POA: Diagnosis not present

## 2018-03-13 DIAGNOSIS — E785 Hyperlipidemia, unspecified: Secondary | ICD-10-CM | POA: Diagnosis not present

## 2018-03-13 DIAGNOSIS — C8330 Diffuse large B-cell lymphoma, unspecified site: Secondary | ICD-10-CM | POA: Diagnosis not present

## 2018-03-13 DIAGNOSIS — Z9221 Personal history of antineoplastic chemotherapy: Secondary | ICD-10-CM | POA: Diagnosis not present

## 2018-03-18 DIAGNOSIS — H25812 Combined forms of age-related cataract, left eye: Secondary | ICD-10-CM | POA: Diagnosis not present

## 2018-03-18 DIAGNOSIS — H52202 Unspecified astigmatism, left eye: Secondary | ICD-10-CM | POA: Diagnosis not present

## 2018-03-18 DIAGNOSIS — H2512 Age-related nuclear cataract, left eye: Secondary | ICD-10-CM | POA: Diagnosis not present

## 2018-03-25 DIAGNOSIS — M62552 Muscle wasting and atrophy, not elsewhere classified, left thigh: Secondary | ICD-10-CM | POA: Diagnosis not present

## 2018-03-25 DIAGNOSIS — M62551 Muscle wasting and atrophy, not elsewhere classified, right thigh: Secondary | ICD-10-CM | POA: Diagnosis not present

## 2018-03-25 DIAGNOSIS — C8590 Non-Hodgkin lymphoma, unspecified, unspecified site: Secondary | ICD-10-CM | POA: Diagnosis not present

## 2018-03-25 DIAGNOSIS — R2689 Other abnormalities of gait and mobility: Secondary | ICD-10-CM | POA: Diagnosis not present

## 2018-03-27 DIAGNOSIS — R2689 Other abnormalities of gait and mobility: Secondary | ICD-10-CM | POA: Diagnosis not present

## 2018-03-27 DIAGNOSIS — M62551 Muscle wasting and atrophy, not elsewhere classified, right thigh: Secondary | ICD-10-CM | POA: Diagnosis not present

## 2018-03-27 DIAGNOSIS — M62552 Muscle wasting and atrophy, not elsewhere classified, left thigh: Secondary | ICD-10-CM | POA: Diagnosis not present

## 2018-03-27 DIAGNOSIS — C8590 Non-Hodgkin lymphoma, unspecified, unspecified site: Secondary | ICD-10-CM | POA: Diagnosis not present

## 2018-03-29 DIAGNOSIS — N390 Urinary tract infection, site not specified: Secondary | ICD-10-CM | POA: Diagnosis not present

## 2018-04-01 DIAGNOSIS — C8590 Non-Hodgkin lymphoma, unspecified, unspecified site: Secondary | ICD-10-CM | POA: Diagnosis not present

## 2018-04-01 DIAGNOSIS — R2689 Other abnormalities of gait and mobility: Secondary | ICD-10-CM | POA: Diagnosis not present

## 2018-04-01 DIAGNOSIS — M62552 Muscle wasting and atrophy, not elsewhere classified, left thigh: Secondary | ICD-10-CM | POA: Diagnosis not present

## 2018-04-01 DIAGNOSIS — M62551 Muscle wasting and atrophy, not elsewhere classified, right thigh: Secondary | ICD-10-CM | POA: Diagnosis not present

## 2018-04-03 DIAGNOSIS — M62552 Muscle wasting and atrophy, not elsewhere classified, left thigh: Secondary | ICD-10-CM | POA: Diagnosis not present

## 2018-04-03 DIAGNOSIS — C8590 Non-Hodgkin lymphoma, unspecified, unspecified site: Secondary | ICD-10-CM | POA: Diagnosis not present

## 2018-04-03 DIAGNOSIS — M62551 Muscle wasting and atrophy, not elsewhere classified, right thigh: Secondary | ICD-10-CM | POA: Diagnosis not present

## 2018-04-03 DIAGNOSIS — R2689 Other abnormalities of gait and mobility: Secondary | ICD-10-CM | POA: Diagnosis not present

## 2018-04-06 DIAGNOSIS — S199XXA Unspecified injury of neck, initial encounter: Secondary | ICD-10-CM | POA: Diagnosis not present

## 2018-04-06 DIAGNOSIS — E785 Hyperlipidemia, unspecified: Secondary | ICD-10-CM | POA: Diagnosis not present

## 2018-04-06 DIAGNOSIS — E876 Hypokalemia: Secondary | ICD-10-CM | POA: Diagnosis not present

## 2018-04-06 DIAGNOSIS — G9389 Other specified disorders of brain: Secondary | ICD-10-CM | POA: Diagnosis not present

## 2018-04-06 DIAGNOSIS — Z8572 Personal history of non-Hodgkin lymphomas: Secondary | ICD-10-CM | POA: Diagnosis not present

## 2018-04-06 DIAGNOSIS — Z9849 Cataract extraction status, unspecified eye: Secondary | ICD-10-CM | POA: Diagnosis not present

## 2018-04-06 DIAGNOSIS — S4992XA Unspecified injury of left shoulder and upper arm, initial encounter: Secondary | ICD-10-CM | POA: Diagnosis not present

## 2018-04-06 DIAGNOSIS — R930 Abnormal findings on diagnostic imaging of skull and head, not elsewhere classified: Secondary | ICD-10-CM | POA: Diagnosis not present

## 2018-04-06 DIAGNOSIS — I619 Nontraumatic intracerebral hemorrhage, unspecified: Secondary | ICD-10-CM | POA: Diagnosis not present

## 2018-04-06 DIAGNOSIS — S0990XA Unspecified injury of head, initial encounter: Secondary | ICD-10-CM | POA: Diagnosis not present

## 2018-04-06 DIAGNOSIS — Z87891 Personal history of nicotine dependence: Secondary | ICD-10-CM | POA: Diagnosis not present

## 2018-04-06 DIAGNOSIS — R739 Hyperglycemia, unspecified: Secondary | ICD-10-CM | POA: Diagnosis not present

## 2018-04-06 DIAGNOSIS — Z79899 Other long term (current) drug therapy: Secondary | ICD-10-CM | POA: Diagnosis not present

## 2018-04-06 DIAGNOSIS — Z881 Allergy status to other antibiotic agents status: Secondary | ICD-10-CM | POA: Diagnosis not present

## 2018-04-06 DIAGNOSIS — I10 Essential (primary) hypertension: Secondary | ICD-10-CM | POA: Diagnosis not present

## 2018-04-06 DIAGNOSIS — G629 Polyneuropathy, unspecified: Secondary | ICD-10-CM | POA: Diagnosis not present

## 2018-04-06 DIAGNOSIS — S59912A Unspecified injury of left forearm, initial encounter: Secondary | ICD-10-CM | POA: Diagnosis not present

## 2018-04-06 DIAGNOSIS — Z9221 Personal history of antineoplastic chemotherapy: Secondary | ICD-10-CM | POA: Diagnosis not present

## 2018-04-06 DIAGNOSIS — M109 Gout, unspecified: Secondary | ICD-10-CM | POA: Diagnosis not present

## 2018-04-07 DIAGNOSIS — Z79899 Other long term (current) drug therapy: Secondary | ICD-10-CM | POA: Diagnosis not present

## 2018-04-07 DIAGNOSIS — W19XXXA Unspecified fall, initial encounter: Secondary | ICD-10-CM | POA: Diagnosis not present

## 2018-04-07 DIAGNOSIS — S0990XA Unspecified injury of head, initial encounter: Secondary | ICD-10-CM | POA: Diagnosis not present

## 2018-04-07 DIAGNOSIS — R739 Hyperglycemia, unspecified: Secondary | ICD-10-CM | POA: Diagnosis not present

## 2018-04-07 DIAGNOSIS — I10 Essential (primary) hypertension: Secondary | ICD-10-CM | POA: Diagnosis not present

## 2018-04-07 DIAGNOSIS — Z9221 Personal history of antineoplastic chemotherapy: Secondary | ICD-10-CM | POA: Diagnosis not present

## 2018-04-07 DIAGNOSIS — I619 Nontraumatic intracerebral hemorrhage, unspecified: Secondary | ICD-10-CM | POA: Diagnosis not present

## 2018-04-07 DIAGNOSIS — R296 Repeated falls: Secondary | ICD-10-CM | POA: Diagnosis not present

## 2018-04-07 DIAGNOSIS — M79639 Pain in unspecified forearm: Secondary | ICD-10-CM | POA: Diagnosis not present

## 2018-04-07 DIAGNOSIS — G9389 Other specified disorders of brain: Secondary | ICD-10-CM | POA: Diagnosis not present

## 2018-04-07 DIAGNOSIS — Z8579 Personal history of other malignant neoplasms of lymphoid, hematopoietic and related tissues: Secondary | ICD-10-CM | POA: Diagnosis not present

## 2018-04-07 DIAGNOSIS — Z8572 Personal history of non-Hodgkin lymphomas: Secondary | ICD-10-CM | POA: Diagnosis not present

## 2018-04-07 DIAGNOSIS — R930 Abnormal findings on diagnostic imaging of skull and head, not elsewhere classified: Secondary | ICD-10-CM | POA: Diagnosis not present

## 2018-04-15 DIAGNOSIS — C8590 Non-Hodgkin lymphoma, unspecified, unspecified site: Secondary | ICD-10-CM | POA: Diagnosis not present

## 2018-04-15 DIAGNOSIS — M62552 Muscle wasting and atrophy, not elsewhere classified, left thigh: Secondary | ICD-10-CM | POA: Diagnosis not present

## 2018-04-15 DIAGNOSIS — R2689 Other abnormalities of gait and mobility: Secondary | ICD-10-CM | POA: Diagnosis not present

## 2018-04-15 DIAGNOSIS — M62551 Muscle wasting and atrophy, not elsewhere classified, right thigh: Secondary | ICD-10-CM | POA: Diagnosis not present

## 2018-04-17 DIAGNOSIS — R2689 Other abnormalities of gait and mobility: Secondary | ICD-10-CM | POA: Diagnosis not present

## 2018-04-17 DIAGNOSIS — C8590 Non-Hodgkin lymphoma, unspecified, unspecified site: Secondary | ICD-10-CM | POA: Diagnosis not present

## 2018-04-17 DIAGNOSIS — M62552 Muscle wasting and atrophy, not elsewhere classified, left thigh: Secondary | ICD-10-CM | POA: Diagnosis not present

## 2018-04-17 DIAGNOSIS — M62551 Muscle wasting and atrophy, not elsewhere classified, right thigh: Secondary | ICD-10-CM | POA: Diagnosis not present

## 2018-04-22 DIAGNOSIS — C8590 Non-Hodgkin lymphoma, unspecified, unspecified site: Secondary | ICD-10-CM | POA: Diagnosis not present

## 2018-04-22 DIAGNOSIS — R2689 Other abnormalities of gait and mobility: Secondary | ICD-10-CM | POA: Diagnosis not present

## 2018-04-22 DIAGNOSIS — M62552 Muscle wasting and atrophy, not elsewhere classified, left thigh: Secondary | ICD-10-CM | POA: Diagnosis not present

## 2018-04-22 DIAGNOSIS — M62551 Muscle wasting and atrophy, not elsewhere classified, right thigh: Secondary | ICD-10-CM | POA: Diagnosis not present

## 2018-04-24 DIAGNOSIS — M62551 Muscle wasting and atrophy, not elsewhere classified, right thigh: Secondary | ICD-10-CM | POA: Diagnosis not present

## 2018-04-24 DIAGNOSIS — R2689 Other abnormalities of gait and mobility: Secondary | ICD-10-CM | POA: Diagnosis not present

## 2018-04-24 DIAGNOSIS — C8590 Non-Hodgkin lymphoma, unspecified, unspecified site: Secondary | ICD-10-CM | POA: Diagnosis not present

## 2018-04-24 DIAGNOSIS — M62552 Muscle wasting and atrophy, not elsewhere classified, left thigh: Secondary | ICD-10-CM | POA: Diagnosis not present

## 2018-04-26 DIAGNOSIS — R2689 Other abnormalities of gait and mobility: Secondary | ICD-10-CM | POA: Diagnosis not present

## 2018-04-26 DIAGNOSIS — M62552 Muscle wasting and atrophy, not elsewhere classified, left thigh: Secondary | ICD-10-CM | POA: Diagnosis not present

## 2018-04-26 DIAGNOSIS — C8590 Non-Hodgkin lymphoma, unspecified, unspecified site: Secondary | ICD-10-CM | POA: Diagnosis not present

## 2018-04-26 DIAGNOSIS — M62551 Muscle wasting and atrophy, not elsewhere classified, right thigh: Secondary | ICD-10-CM | POA: Diagnosis not present

## 2018-04-29 DIAGNOSIS — M62551 Muscle wasting and atrophy, not elsewhere classified, right thigh: Secondary | ICD-10-CM | POA: Diagnosis not present

## 2018-04-29 DIAGNOSIS — M62552 Muscle wasting and atrophy, not elsewhere classified, left thigh: Secondary | ICD-10-CM | POA: Diagnosis not present

## 2018-04-29 DIAGNOSIS — C8590 Non-Hodgkin lymphoma, unspecified, unspecified site: Secondary | ICD-10-CM | POA: Diagnosis not present

## 2018-04-29 DIAGNOSIS — R2689 Other abnormalities of gait and mobility: Secondary | ICD-10-CM | POA: Diagnosis not present

## 2018-05-01 DIAGNOSIS — C8590 Non-Hodgkin lymphoma, unspecified, unspecified site: Secondary | ICD-10-CM | POA: Diagnosis not present

## 2018-05-01 DIAGNOSIS — M62552 Muscle wasting and atrophy, not elsewhere classified, left thigh: Secondary | ICD-10-CM | POA: Diagnosis not present

## 2018-05-01 DIAGNOSIS — M62551 Muscle wasting and atrophy, not elsewhere classified, right thigh: Secondary | ICD-10-CM | POA: Diagnosis not present

## 2018-05-01 DIAGNOSIS — R2689 Other abnormalities of gait and mobility: Secondary | ICD-10-CM | POA: Diagnosis not present

## 2018-05-02 DIAGNOSIS — M62552 Muscle wasting and atrophy, not elsewhere classified, left thigh: Secondary | ICD-10-CM | POA: Diagnosis not present

## 2018-05-02 DIAGNOSIS — M62551 Muscle wasting and atrophy, not elsewhere classified, right thigh: Secondary | ICD-10-CM | POA: Diagnosis not present

## 2018-05-02 DIAGNOSIS — R2689 Other abnormalities of gait and mobility: Secondary | ICD-10-CM | POA: Diagnosis not present

## 2018-05-02 DIAGNOSIS — C8590 Non-Hodgkin lymphoma, unspecified, unspecified site: Secondary | ICD-10-CM | POA: Diagnosis not present

## 2018-05-07 DIAGNOSIS — R2689 Other abnormalities of gait and mobility: Secondary | ICD-10-CM | POA: Diagnosis not present

## 2018-05-07 DIAGNOSIS — M62552 Muscle wasting and atrophy, not elsewhere classified, left thigh: Secondary | ICD-10-CM | POA: Diagnosis not present

## 2018-05-07 DIAGNOSIS — C8590 Non-Hodgkin lymphoma, unspecified, unspecified site: Secondary | ICD-10-CM | POA: Diagnosis not present

## 2018-05-07 DIAGNOSIS — M62551 Muscle wasting and atrophy, not elsewhere classified, right thigh: Secondary | ICD-10-CM | POA: Diagnosis not present

## 2018-05-08 DIAGNOSIS — C8590 Non-Hodgkin lymphoma, unspecified, unspecified site: Secondary | ICD-10-CM | POA: Diagnosis not present

## 2018-05-08 DIAGNOSIS — R2689 Other abnormalities of gait and mobility: Secondary | ICD-10-CM | POA: Diagnosis not present

## 2018-05-08 DIAGNOSIS — M62551 Muscle wasting and atrophy, not elsewhere classified, right thigh: Secondary | ICD-10-CM | POA: Diagnosis not present

## 2018-05-08 DIAGNOSIS — M62552 Muscle wasting and atrophy, not elsewhere classified, left thigh: Secondary | ICD-10-CM | POA: Diagnosis not present

## 2018-05-15 DIAGNOSIS — R2689 Other abnormalities of gait and mobility: Secondary | ICD-10-CM | POA: Diagnosis not present

## 2018-05-15 DIAGNOSIS — M62551 Muscle wasting and atrophy, not elsewhere classified, right thigh: Secondary | ICD-10-CM | POA: Diagnosis not present

## 2018-05-15 DIAGNOSIS — M62552 Muscle wasting and atrophy, not elsewhere classified, left thigh: Secondary | ICD-10-CM | POA: Diagnosis not present

## 2018-05-15 DIAGNOSIS — C8590 Non-Hodgkin lymphoma, unspecified, unspecified site: Secondary | ICD-10-CM | POA: Diagnosis not present

## 2018-05-16 DIAGNOSIS — M62551 Muscle wasting and atrophy, not elsewhere classified, right thigh: Secondary | ICD-10-CM | POA: Diagnosis not present

## 2018-05-16 DIAGNOSIS — C8590 Non-Hodgkin lymphoma, unspecified, unspecified site: Secondary | ICD-10-CM | POA: Diagnosis not present

## 2018-05-16 DIAGNOSIS — R2689 Other abnormalities of gait and mobility: Secondary | ICD-10-CM | POA: Diagnosis not present

## 2018-05-16 DIAGNOSIS — M62552 Muscle wasting and atrophy, not elsewhere classified, left thigh: Secondary | ICD-10-CM | POA: Diagnosis not present

## 2018-05-21 DIAGNOSIS — R2689 Other abnormalities of gait and mobility: Secondary | ICD-10-CM | POA: Diagnosis not present

## 2018-05-21 DIAGNOSIS — M62552 Muscle wasting and atrophy, not elsewhere classified, left thigh: Secondary | ICD-10-CM | POA: Diagnosis not present

## 2018-05-21 DIAGNOSIS — M62551 Muscle wasting and atrophy, not elsewhere classified, right thigh: Secondary | ICD-10-CM | POA: Diagnosis not present

## 2018-05-21 DIAGNOSIS — C8590 Non-Hodgkin lymphoma, unspecified, unspecified site: Secondary | ICD-10-CM | POA: Diagnosis not present

## 2018-05-23 DIAGNOSIS — M62552 Muscle wasting and atrophy, not elsewhere classified, left thigh: Secondary | ICD-10-CM | POA: Diagnosis not present

## 2018-05-23 DIAGNOSIS — R2689 Other abnormalities of gait and mobility: Secondary | ICD-10-CM | POA: Diagnosis not present

## 2018-05-23 DIAGNOSIS — M62551 Muscle wasting and atrophy, not elsewhere classified, right thigh: Secondary | ICD-10-CM | POA: Diagnosis not present

## 2018-05-23 DIAGNOSIS — C8590 Non-Hodgkin lymphoma, unspecified, unspecified site: Secondary | ICD-10-CM | POA: Diagnosis not present

## 2018-05-27 DIAGNOSIS — M62551 Muscle wasting and atrophy, not elsewhere classified, right thigh: Secondary | ICD-10-CM | POA: Diagnosis not present

## 2018-05-27 DIAGNOSIS — N390 Urinary tract infection, site not specified: Secondary | ICD-10-CM | POA: Diagnosis not present

## 2018-05-27 DIAGNOSIS — R2689 Other abnormalities of gait and mobility: Secondary | ICD-10-CM | POA: Diagnosis not present

## 2018-05-27 DIAGNOSIS — M62552 Muscle wasting and atrophy, not elsewhere classified, left thigh: Secondary | ICD-10-CM | POA: Diagnosis not present

## 2018-05-27 DIAGNOSIS — C8590 Non-Hodgkin lymphoma, unspecified, unspecified site: Secondary | ICD-10-CM | POA: Diagnosis not present

## 2018-05-29 DIAGNOSIS — M62551 Muscle wasting and atrophy, not elsewhere classified, right thigh: Secondary | ICD-10-CM | POA: Diagnosis not present

## 2018-05-29 DIAGNOSIS — M62552 Muscle wasting and atrophy, not elsewhere classified, left thigh: Secondary | ICD-10-CM | POA: Diagnosis not present

## 2018-05-29 DIAGNOSIS — C8590 Non-Hodgkin lymphoma, unspecified, unspecified site: Secondary | ICD-10-CM | POA: Diagnosis not present

## 2018-05-29 DIAGNOSIS — R2689 Other abnormalities of gait and mobility: Secondary | ICD-10-CM | POA: Diagnosis not present

## 2018-06-11 DIAGNOSIS — C8590 Non-Hodgkin lymphoma, unspecified, unspecified site: Secondary | ICD-10-CM | POA: Diagnosis not present

## 2018-06-11 DIAGNOSIS — M62551 Muscle wasting and atrophy, not elsewhere classified, right thigh: Secondary | ICD-10-CM | POA: Diagnosis not present

## 2018-06-11 DIAGNOSIS — R2689 Other abnormalities of gait and mobility: Secondary | ICD-10-CM | POA: Diagnosis not present

## 2018-06-11 DIAGNOSIS — M62552 Muscle wasting and atrophy, not elsewhere classified, left thigh: Secondary | ICD-10-CM | POA: Diagnosis not present

## 2018-06-12 DIAGNOSIS — M62552 Muscle wasting and atrophy, not elsewhere classified, left thigh: Secondary | ICD-10-CM | POA: Diagnosis not present

## 2018-06-12 DIAGNOSIS — R2689 Other abnormalities of gait and mobility: Secondary | ICD-10-CM | POA: Diagnosis not present

## 2018-06-12 DIAGNOSIS — N131 Hydronephrosis with ureteral stricture, not elsewhere classified: Secondary | ICD-10-CM | POA: Diagnosis not present

## 2018-06-12 DIAGNOSIS — N1330 Unspecified hydronephrosis: Secondary | ICD-10-CM | POA: Diagnosis not present

## 2018-06-12 DIAGNOSIS — M62551 Muscle wasting and atrophy, not elsewhere classified, right thigh: Secondary | ICD-10-CM | POA: Diagnosis not present

## 2018-06-12 DIAGNOSIS — R3915 Urgency of urination: Secondary | ICD-10-CM | POA: Diagnosis not present

## 2018-06-12 DIAGNOSIS — C8590 Non-Hodgkin lymphoma, unspecified, unspecified site: Secondary | ICD-10-CM | POA: Diagnosis not present

## 2018-06-13 DIAGNOSIS — C8330 Diffuse large B-cell lymphoma, unspecified site: Secondary | ICD-10-CM | POA: Diagnosis not present

## 2018-06-13 DIAGNOSIS — C8380 Other non-follicular lymphoma, unspecified site: Secondary | ICD-10-CM | POA: Diagnosis not present

## 2018-06-13 DIAGNOSIS — D1809 Hemangioma of other sites: Secondary | ICD-10-CM | POA: Diagnosis not present

## 2018-06-13 DIAGNOSIS — D259 Leiomyoma of uterus, unspecified: Secondary | ICD-10-CM | POA: Diagnosis not present

## 2018-06-24 DIAGNOSIS — Z9221 Personal history of antineoplastic chemotherapy: Secondary | ICD-10-CM

## 2018-06-24 DIAGNOSIS — C8330 Diffuse large B-cell lymphoma, unspecified site: Secondary | ICD-10-CM | POA: Diagnosis not present

## 2018-06-24 DIAGNOSIS — C8338 Diffuse large B-cell lymphoma, lymph nodes of multiple sites: Secondary | ICD-10-CM

## 2018-06-25 ENCOUNTER — Other Ambulatory Visit: Payer: Self-pay | Admitting: Urology

## 2018-06-25 DIAGNOSIS — R2689 Other abnormalities of gait and mobility: Secondary | ICD-10-CM | POA: Diagnosis not present

## 2018-06-25 DIAGNOSIS — M62552 Muscle wasting and atrophy, not elsewhere classified, left thigh: Secondary | ICD-10-CM | POA: Diagnosis not present

## 2018-06-25 DIAGNOSIS — C8590 Non-Hodgkin lymphoma, unspecified, unspecified site: Secondary | ICD-10-CM | POA: Diagnosis not present

## 2018-06-25 DIAGNOSIS — M62551 Muscle wasting and atrophy, not elsewhere classified, right thigh: Secondary | ICD-10-CM | POA: Diagnosis not present

## 2018-06-28 DIAGNOSIS — M62551 Muscle wasting and atrophy, not elsewhere classified, right thigh: Secondary | ICD-10-CM | POA: Diagnosis not present

## 2018-06-28 DIAGNOSIS — R2689 Other abnormalities of gait and mobility: Secondary | ICD-10-CM | POA: Diagnosis not present

## 2018-06-28 DIAGNOSIS — M62552 Muscle wasting and atrophy, not elsewhere classified, left thigh: Secondary | ICD-10-CM | POA: Diagnosis not present

## 2018-06-28 DIAGNOSIS — C8590 Non-Hodgkin lymphoma, unspecified, unspecified site: Secondary | ICD-10-CM | POA: Diagnosis not present

## 2018-07-02 DIAGNOSIS — C8590 Non-Hodgkin lymphoma, unspecified, unspecified site: Secondary | ICD-10-CM | POA: Diagnosis not present

## 2018-07-02 DIAGNOSIS — M62551 Muscle wasting and atrophy, not elsewhere classified, right thigh: Secondary | ICD-10-CM | POA: Diagnosis not present

## 2018-07-02 DIAGNOSIS — R2689 Other abnormalities of gait and mobility: Secondary | ICD-10-CM | POA: Diagnosis not present

## 2018-07-02 DIAGNOSIS — M62552 Muscle wasting and atrophy, not elsewhere classified, left thigh: Secondary | ICD-10-CM | POA: Diagnosis not present

## 2018-07-04 DIAGNOSIS — R2689 Other abnormalities of gait and mobility: Secondary | ICD-10-CM | POA: Diagnosis not present

## 2018-07-04 DIAGNOSIS — M62552 Muscle wasting and atrophy, not elsewhere classified, left thigh: Secondary | ICD-10-CM | POA: Diagnosis not present

## 2018-07-04 DIAGNOSIS — M62551 Muscle wasting and atrophy, not elsewhere classified, right thigh: Secondary | ICD-10-CM | POA: Diagnosis not present

## 2018-07-04 DIAGNOSIS — C8590 Non-Hodgkin lymphoma, unspecified, unspecified site: Secondary | ICD-10-CM | POA: Diagnosis not present

## 2018-07-09 DIAGNOSIS — M62551 Muscle wasting and atrophy, not elsewhere classified, right thigh: Secondary | ICD-10-CM | POA: Diagnosis not present

## 2018-07-09 DIAGNOSIS — M62552 Muscle wasting and atrophy, not elsewhere classified, left thigh: Secondary | ICD-10-CM | POA: Diagnosis not present

## 2018-07-09 DIAGNOSIS — C8590 Non-Hodgkin lymphoma, unspecified, unspecified site: Secondary | ICD-10-CM | POA: Diagnosis not present

## 2018-07-09 DIAGNOSIS — R2689 Other abnormalities of gait and mobility: Secondary | ICD-10-CM | POA: Diagnosis not present

## 2018-07-11 DIAGNOSIS — M62552 Muscle wasting and atrophy, not elsewhere classified, left thigh: Secondary | ICD-10-CM | POA: Diagnosis not present

## 2018-07-11 DIAGNOSIS — C8590 Non-Hodgkin lymphoma, unspecified, unspecified site: Secondary | ICD-10-CM | POA: Diagnosis not present

## 2018-07-11 DIAGNOSIS — M62551 Muscle wasting and atrophy, not elsewhere classified, right thigh: Secondary | ICD-10-CM | POA: Diagnosis not present

## 2018-07-11 DIAGNOSIS — R2689 Other abnormalities of gait and mobility: Secondary | ICD-10-CM | POA: Diagnosis not present

## 2018-07-15 DIAGNOSIS — R2689 Other abnormalities of gait and mobility: Secondary | ICD-10-CM | POA: Diagnosis not present

## 2018-07-15 DIAGNOSIS — M62552 Muscle wasting and atrophy, not elsewhere classified, left thigh: Secondary | ICD-10-CM | POA: Diagnosis not present

## 2018-07-15 DIAGNOSIS — C8590 Non-Hodgkin lymphoma, unspecified, unspecified site: Secondary | ICD-10-CM | POA: Diagnosis not present

## 2018-07-15 DIAGNOSIS — M62551 Muscle wasting and atrophy, not elsewhere classified, right thigh: Secondary | ICD-10-CM | POA: Diagnosis not present

## 2018-07-17 DIAGNOSIS — M62551 Muscle wasting and atrophy, not elsewhere classified, right thigh: Secondary | ICD-10-CM | POA: Diagnosis not present

## 2018-07-17 DIAGNOSIS — C8590 Non-Hodgkin lymphoma, unspecified, unspecified site: Secondary | ICD-10-CM | POA: Diagnosis not present

## 2018-07-17 DIAGNOSIS — R2689 Other abnormalities of gait and mobility: Secondary | ICD-10-CM | POA: Diagnosis not present

## 2018-07-17 DIAGNOSIS — M62552 Muscle wasting and atrophy, not elsewhere classified, left thigh: Secondary | ICD-10-CM | POA: Diagnosis not present

## 2018-07-22 ENCOUNTER — Other Ambulatory Visit: Payer: Self-pay

## 2018-07-22 ENCOUNTER — Encounter (HOSPITAL_BASED_OUTPATIENT_CLINIC_OR_DEPARTMENT_OTHER): Payer: Self-pay

## 2018-07-22 DIAGNOSIS — C8590 Non-Hodgkin lymphoma, unspecified, unspecified site: Secondary | ICD-10-CM | POA: Diagnosis not present

## 2018-07-22 DIAGNOSIS — M62551 Muscle wasting and atrophy, not elsewhere classified, right thigh: Secondary | ICD-10-CM | POA: Diagnosis not present

## 2018-07-22 DIAGNOSIS — M62552 Muscle wasting and atrophy, not elsewhere classified, left thigh: Secondary | ICD-10-CM | POA: Diagnosis not present

## 2018-07-22 DIAGNOSIS — R2689 Other abnormalities of gait and mobility: Secondary | ICD-10-CM | POA: Diagnosis not present

## 2018-07-22 NOTE — Progress Notes (Signed)
Spoke with:  Kristina NPO:  After Torres, Kristina Torres, Kristina Torres, Kristina Torres  Arrival time:  [id number] Labs:  Istat 8, (EKG 09/12/17 and ECHO 08/30/17 in epic) AM medications:  None Pre op orders:  Yes Ride home:  Kristina Torres (husband) [phone]

## 2018-07-22 NOTE — Progress Notes (Signed)
Port a cath in place

## 2018-07-24 DIAGNOSIS — R2689 Other abnormalities of gait and mobility: Secondary | ICD-10-CM | POA: Diagnosis not present

## 2018-07-24 DIAGNOSIS — M62551 Muscle wasting and atrophy, not elsewhere classified, right thigh: Secondary | ICD-10-CM | POA: Diagnosis not present

## 2018-07-24 DIAGNOSIS — M62552 Muscle wasting and atrophy, not elsewhere classified, left thigh: Secondary | ICD-10-CM | POA: Diagnosis not present

## 2018-07-24 DIAGNOSIS — C8590 Non-Hodgkin lymphoma, unspecified, unspecified site: Secondary | ICD-10-CM | POA: Diagnosis not present

## 2018-08-01 NOTE — H&P (Signed)
Kristina Torres is a 72 year-old female with malignant right ureteral obstruction currently managed with ureteral stent.  11/15/17: She is doing well. She is currently receiving chemotherapy. She is scheduled for a CT scan to reassess things again in a week. She has her right nephrostomy tube which has been draining clear urine. She is tolerating the tube well.   02/12/18: She is doing quite well. Urologically she said she does not even know she has a stent in place. She has no voiding symptoms, flank pain or hematuria.    06/12/18: Her last stent exchange was in early March. He presents today for follow-up imaging studies. No interval fevers or abx treatment. Complaining of increased painful urgency and suprapubic pain/discomfort after voiding for the past month or so. This is a new symptom compared to previous assessment prior to her last stent exchange. Denies flank or back pain. Denies fevers. Denies gross hematuria.   Her problem was discovered 08/14/2017. Marland Kitchen She had the following imaging studies done: Renal Ultrasound and CT Scan.   She is currently having groin pain. She denies having flank pain, back pain, nausea, vomiting, fever, and chills.      ALLERGIES: Streptomycin    MEDICATIONS: Allopurinol  Cipro  Hydrochlorothiazide 12.5 mg tablet  Simvastatin  Calcium 1000 + D3 1,000 mg calcium (2,500 mg)-800 unit tablet  Colace 240 mg capsule  Folic Acid  Lasix 20 mg tablet  Methotrexate 2.5 mg tablet  Ondansetron Hcl 4 mg tablet  Potassium  Pyridium  Tramadol Hcl 50 mg tablet     GU PSH: Cystoscopy Insert Stent, Right - 02/18/2018    NON-GU PSH: Appendectomy Carpal tunnel surgery Cataract surgery, Bilateral Exploratory Laparotomy Salpingo Oophorectomy    GU PMH: Ureteral obstruction (Stable), Right, She has extrinsic obstruction of her right ureter. I am going to have her nephrostomy tube internalized and then I will have her return in 3 months so we can plan to change her  stent and reassess whether her ureteral obstruction has resolved. - 11/15/2017 Hydronephrosis Unspec, Right, She has hydronephrosis on the right-hand side I do see enough renal parenchyma that this is likely not long-standing but further evaluation will be undertaken using a CT scan with and without contrast which can help me evaluate for the presence of calculi, intrinsic lesions or extrinsic compression. - 08/16/2017      PMH Notes: Right ureteral obstruction:  She was seen in my office with new right hydronephrosis. At that time I had recommended cystoscopy with left retrograde pyelogram and possible ureteroscopy in order to determine the cause of her obstruction.  She was subsequently admitted to the hospital and underwent a right percutaneous nephrostomy tube placement. In addition she has been found to have what appears to be a lymphoproliferative disorder with significant abdominal adenopathy.  At the time of nephrostomy tube change she underwent an antegrade nephrostogram.This study revealed that the site of her obstruction is in the mid ureteral region at the pelvic brim with the CT scan revealing a tapering, normal ureter distal to this. There was no "goblet sign"to suggest an intraluminal cause of her obstruction however this study revealed a tapering ureter consistent with extrinsic compression. In addition her urine was noted be free of any red cells which would also be consistent with extrinsic compression rather than any intrinsic ureteral lesion.  We then discussed The fact that her ureteral obstruction can be managed over the longer term with internalization of her nephrostomy tube by the placement of a double-J  stent in an anterograde fashion by interventional radiology with removal of her nephrostomy tube. I can then manage her stent changes as an outpatient with the eventual hope that the obstruction will respond to chemotherapy and her stent can be eventually removed.      NON-GU PMH:  Pyuria/other UA findings, In preparation for her upcoming surgery I will culture her urine. - 02/12/2018 Benign intracranial hypertension Diverticulosis Gout Hypercholesterolemia Rheumatoid Arthritis    FAMILY HISTORY: 1 Daughter - Daughter 1 son - Son   SOCIAL HISTORY: Marital Status: Married Preferred Language: English Current Smoking Status: Patient does not smoke anymore.   Tobacco Use Assessment Completed: Used Tobacco in last 30 days? Drinks 3 caffeinated drinks per day.    REVIEW OF SYSTEMS:    GU Review Female:   Patient reports frequent urination, hard to postpone urination, burning /pain with urination, and leakage of urine. Patient denies get up at night to urinate, stream starts and stops, trouble starting your stream, have to strain to urinate, and being pregnant.  Gastrointestinal (Upper):   Patient denies nausea, vomiting, and indigestion/ heartburn.  Gastrointestinal (Lower):   Patient denies diarrhea and constipation.  Constitutional:   Patient denies fever, night sweats, weight loss, and fatigue.  Skin:   Patient denies skin rash/ lesion and itching.  Eyes:   Patient denies blurred vision and double vision.  Ears/ Nose/ Throat:   Patient denies sore throat and sinus problems.  Hematologic/Lymphatic:   Patient denies swollen glands and easy bruising.  Cardiovascular:   Patient reports leg swelling. Patient denies chest pains.  Respiratory:   Patient reports cough. Patient denies shortness of breath.  Endocrine:   Patient denies excessive thirst.  Musculoskeletal:   Patient denies back pain and joint pain.  Neurological:   Patient denies headaches and dizziness.  Psychologic:   Patient denies depression and anxiety.   VITAL SIGNS:   BP 117/68 mmHg  Pulse 64 /min  Temperature 97.8 F / 36.5 C   MULTI-SYSTEM PHYSICAL EXAMINATION:    Constitutional: Well-nourished. No physical deformities. Normally developed. Good grooming.  Respiratory: No labored breathing, no  use of accessory muscles.   Cardiovascular: Normal temperature, normal extremity pulses, no swelling, no varicosities.  Skin: No paleness, no jaundice, no cyanosis. No lesion, no ulcer, no rash.  Neurologic / Psychiatric: Oriented to time, oriented to place, oriented to person. No depression, no anxiety, no agitation.  Gastrointestinal: No mass, no tenderness, no rigidity, non obese abdomen. No CVAT, no flank or suprapubic tenderness.  Musculoskeletal: Normal gait and station of head and neck.     PAST DATA REVIEWED:  Source Of History:  Patient  Records Review:   Previous Hospital Records, Previous Patient Records  Urine Test Review:   Urinalysis, Urine Culture  X-Ray Review: KUB: Reviewed Films. Discussed With Patient.  Renal Ultrasound (Limited): Reviewed Films. Discussed With Patient.     06/12/18  Urinalysis  Urine Appearance Cloudy   Urine Color Yellow   Urine Glucose Neg   Urine Bilirubin Neg   Urine Ketones Neg   Urine Specific Gravity 1.020   Urine Blood 3+   Urine pH 7.0   Urine Protein 2+   Urine Urobilinogen 0.2   Urine Nitrites Neg   Urine Leukocyte Esterase 3+   Urine WBC/hpf >60/hpf   Urine RBC/hpf 3 - 10/hpf   Urine Epithelial Cells 0 - 5/hpf   Urine Bacteria Few (10-25/hpf)   Urine Mucous Not Present   Urine Yeast NS (Not Seen)  Urine Trichomonas Not Present   Urine Cystals NS (Not Seen)   Urine Casts NS (Not Seen)   Urine Sperm Not Present    PROCEDURES:         Renal Ultrasound (Limited) - 10315  Right Kidney: Length: 9.3 cm Depth: 3.8 cm Cortical Width: 0.8 cm Width:3.9 cm    Right Kidney/Ureter:  Cortical thinning noted. ----- Hydronephrosis noted. ----- There is a stent visualized in the kidney. ---- 0.2 cm nonshadowing echogenic focus in the upper pole ( calcification vs vessel).   Bladder:  PVR = 76.3 ml.                KUB - [id number]  A single view of the abdomen is obtained.  Ureteral Stent:  In position ureteral stent. Right ureteral  stent.               Urinalysis w/Scope Dipstick Dipstick Cont'd Micro  Color: Yellow Bilirubin: Neg WBC/hpf: >60/hpf  Appearance: Cloudy Ketones: Neg RBC/hpf: 3 - 10/hpf  Specific Gravity: 1.020 Blood: 3+ Bacteria: Few (10-25/hpf)  pH: 7.0 Protein: 2+ Cystals: NS (Not Seen)  Glucose: Neg Urobilinogen: 0.2 Casts: NS (Not Seen)    Nitrites: Neg Trichomonas: Not Present    Leukocyte Esterase: 3+ Mucous: Not Present      Epithelial Cells: 0 - 5/hpf      Yeast: NS (Not Seen)      Sperm: Not Present    Notes: MICROSCOPIC PERFORMED ON UNCONCENTRATED URINE    ASSESSMENT/PLAN: She has malignant right ureteral obstruction.  She has a double-J stent in place currently and will undergo removal of her stent, retrograde pyelogram to assess the need for continued stent placement and replacement of her stent as indicated.

## 2018-08-02 ENCOUNTER — Ambulatory Visit (HOSPITAL_BASED_OUTPATIENT_CLINIC_OR_DEPARTMENT_OTHER)
Admission: RE | Admit: 2018-08-02 | Discharge: 2018-08-02 | Disposition: A | Discharge status: Home / Self Care | Payer: Medicare Other | Source: Ambulatory Visit | Attending: Urology | Admitting: Urology

## 2018-08-02 ENCOUNTER — Ambulatory Visit (HOSPITAL_BASED_OUTPATIENT_CLINIC_OR_DEPARTMENT_OTHER): Payer: Medicare Other | Admitting: Certified Registered"

## 2018-08-02 ENCOUNTER — Other Ambulatory Visit: Payer: Self-pay

## 2018-08-02 ENCOUNTER — Encounter (HOSPITAL_BASED_OUTPATIENT_CLINIC_OR_DEPARTMENT_OTHER): Payer: Self-pay | Admitting: Certified Registered"

## 2018-08-02 ENCOUNTER — Encounter (HOSPITAL_BASED_OUTPATIENT_CLINIC_OR_DEPARTMENT_OTHER)
Admission: RE | Disposition: A | Discharge status: Home / Self Care | Payer: Self-pay | Source: Ambulatory Visit | Attending: Urology

## 2018-08-02 DIAGNOSIS — N135 Crossing vessel and stricture of ureter without hydronephrosis: Secondary | ICD-10-CM | POA: Insufficient documentation

## 2018-08-02 DIAGNOSIS — M069 Rheumatoid arthritis, unspecified: Secondary | ICD-10-CM | POA: Diagnosis not present

## 2018-08-02 DIAGNOSIS — D649 Anemia, unspecified: Secondary | ICD-10-CM | POA: Diagnosis not present

## 2018-08-02 DIAGNOSIS — I10 Essential (primary) hypertension: Secondary | ICD-10-CM | POA: Diagnosis not present

## 2018-08-02 DIAGNOSIS — N1330 Unspecified hydronephrosis: Secondary | ICD-10-CM | POA: Diagnosis not present

## 2018-08-02 DIAGNOSIS — M109 Gout, unspecified: Secondary | ICD-10-CM | POA: Diagnosis not present

## 2018-08-02 DIAGNOSIS — Z87891 Personal history of nicotine dependence: Secondary | ICD-10-CM | POA: Insufficient documentation

## 2018-08-02 DIAGNOSIS — Z79899 Other long term (current) drug therapy: Secondary | ICD-10-CM | POA: Insufficient documentation

## 2018-08-02 DIAGNOSIS — C8330 Diffuse large B-cell lymphoma, unspecified site: Secondary | ICD-10-CM | POA: Insufficient documentation

## 2018-08-02 DIAGNOSIS — Z466 Encounter for fitting and adjustment of urinary device: Secondary | ICD-10-CM | POA: Diagnosis not present

## 2018-08-02 DIAGNOSIS — Z9221 Personal history of antineoplastic chemotherapy: Secondary | ICD-10-CM | POA: Diagnosis not present

## 2018-08-02 DIAGNOSIS — E7800 Pure hypercholesterolemia, unspecified: Secondary | ICD-10-CM | POA: Insufficient documentation

## 2018-08-02 HISTORY — DX: Pleural effusion, not elsewhere classified: J90

## 2018-08-02 HISTORY — DX: Splenomegaly, not elsewhere classified: R16.1

## 2018-08-02 HISTORY — DX: Cardiac murmur, unspecified: R01.1

## 2018-08-02 HISTORY — DX: Unspecified hydronephrosis: N13.30

## 2018-08-02 HISTORY — DX: Presence of other vascular implants and grafts: Z95.828

## 2018-08-02 HISTORY — PX: CYSTOSCOPY W/ URETERAL STENT PLACEMENT: SHX1429

## 2018-08-02 HISTORY — DX: Crossing vessel and stricture of ureter without hydronephrosis: N13.5

## 2018-08-02 HISTORY — DX: Obesity, unspecified: E66.9

## 2018-08-02 HISTORY — DX: Personal history of other diseases of the respiratory system: Z87.09

## 2018-08-02 HISTORY — DX: Other ill-defined heart diseases: I51.89

## 2018-08-02 HISTORY — DX: Personal history of other medical treatment: Z92.89

## 2018-08-02 HISTORY — DX: Atherosclerosis of aorta: I70.0

## 2018-08-02 HISTORY — DX: Other specified disorders of kidney and ureter: N28.89

## 2018-08-02 HISTORY — DX: Heart disease, unspecified: I51.9

## 2018-08-02 HISTORY — DX: Personal history of other diseases of the digestive system: Z87.19

## 2018-08-02 LAB — POCT I-STAT, CHEM 8
BUN: 20 mg/dL (ref 8–23)
Calcium, Ion: 1.24 mmol/L (ref 1.15–1.40)
Chloride: 107 mmol/L (ref 98–111)
Creatinine, Ser: 0.8 mg/dL (ref 0.44–1.00)
Glucose, Bld: 113 mg/dL — ABNORMAL HIGH (ref 70–99)
HCT: 38 % (ref 36.0–46.0)
Hemoglobin: 12.9 g/dL (ref 12.0–15.0)
Potassium: 4.1 mmol/L (ref 3.5–5.1)
Sodium: 140 mmol/L (ref 135–145)
TCO2: 26 mmol/L (ref 22–32)

## 2018-08-02 SURGERY — CYSTOSCOPY, FLEXIBLE, WITH STENT REPLACEMENT
Anesthesia: General | Site: Ureter | Laterality: Right

## 2018-08-02 MED ORDER — SODIUM CHLORIDE 0.9 % IV SOLN
INTRAVENOUS | Status: DC
  Administered 2018-08-02: 08:00:00 via INTRAVENOUS
  Filled 2018-08-02: qty 1000

## 2018-08-02 MED ORDER — DEXAMETHASONE SODIUM PHOSPHATE 10 MG/ML IJ SOLN
INTRAMUSCULAR | Status: DC | PRN
  Administered 2018-08-02: 10 mg via INTRAVENOUS

## 2018-08-02 MED ORDER — PHENAZOPYRIDINE HCL 200 MG PO TABS: 200.0000 mg | ORAL_TABLET | Freq: Three times a day (TID) | ORAL | 3 refills | Status: DC | PRN

## 2018-08-02 MED ORDER — FENTANYL CITRATE (PF) 100 MCG/2ML IJ SOLN
INTRAMUSCULAR | Status: AC
  Filled 2018-08-02: qty 2

## 2018-08-02 MED ORDER — IOHEXOL 300 MG/ML  SOLN
INTRAMUSCULAR | Status: DC | PRN
  Administered 2018-08-02: 15 mL via URETHRAL

## 2018-08-02 MED ORDER — ONDANSETRON HCL 4 MG/2ML IJ SOLN
INTRAMUSCULAR | Status: DC | PRN
  Administered 2018-08-02: 4 mg via INTRAVENOUS

## 2018-08-02 MED ORDER — STERILE WATER FOR IRRIGATION IR SOLN
Status: DC | PRN
  Administered 2018-08-02: 3000 mL

## 2018-08-02 MED ORDER — FENTANYL CITRATE (PF) 100 MCG/2ML IJ SOLN
INTRAMUSCULAR | Status: DC | PRN
  Administered 2018-08-02: 50 ug via INTRAVENOUS
  Administered 2018-08-02 (×2): 25 ug via INTRAVENOUS

## 2018-08-02 MED ORDER — LIDOCAINE 2% (20 MG/ML) 5 ML SYRINGE
INTRAMUSCULAR | Status: DC | PRN
  Administered 2018-08-02: 50 mg via INTRAVENOUS

## 2018-08-02 MED ORDER — DEXAMETHASONE SODIUM PHOSPHATE 10 MG/ML IJ SOLN
INTRAMUSCULAR | Status: AC
  Filled 2018-08-02: qty 1

## 2018-08-02 MED ORDER — LIDOCAINE 2% (20 MG/ML) 5 ML SYRINGE
INTRAMUSCULAR | Status: AC
  Filled 2018-08-02: qty 5

## 2018-08-02 MED ORDER — ONDANSETRON HCL 4 MG/2ML IJ SOLN
4.0000 mg | Freq: Once | INTRAMUSCULAR | Status: DC | PRN
  Filled 2018-08-02: qty 2

## 2018-08-02 MED ORDER — HYDROMORPHONE HCL 1 MG/ML IJ SOLN
0.2500 mg | INTRAMUSCULAR | Status: DC | PRN
  Administered 2018-08-02: 0.25 mg via INTRAVENOUS
  Filled 2018-08-02: qty 0.5

## 2018-08-02 MED ORDER — MEPERIDINE HCL 25 MG/ML IJ SOLN
6.2500 mg | INTRAMUSCULAR | Status: DC | PRN
  Filled 2018-08-02: qty 1

## 2018-08-02 MED ORDER — ONDANSETRON HCL 4 MG/2ML IJ SOLN
INTRAMUSCULAR | Status: AC
  Filled 2018-08-02: qty 2

## 2018-08-02 MED ORDER — CIPROFLOXACIN IN D5W 400 MG/200ML IV SOLN
400.0000 mg | Freq: Once | INTRAVENOUS | Status: AC
  Administered 2018-08-02: 400 mg via INTRAVENOUS
  Filled 2018-08-02: qty 200

## 2018-08-02 MED ORDER — PROPOFOL 10 MG/ML IV BOLUS
INTRAVENOUS | Status: DC | PRN
  Administered 2018-08-02: 160 mg via INTRAVENOUS

## 2018-08-02 MED ORDER — CIPROFLOXACIN IN D5W 400 MG/200ML IV SOLN
INTRAVENOUS | Status: AC
  Filled 2018-08-02: qty 200

## 2018-08-02 MED ORDER — HYDROMORPHONE HCL 1 MG/ML IJ SOLN
INTRAMUSCULAR | Status: AC
  Filled 2018-08-02: qty 1

## 2018-08-02 MED ORDER — PROPOFOL 10 MG/ML IV BOLUS
INTRAVENOUS | Status: AC
  Filled 2018-08-02: qty 20

## 2018-08-02 SURGICAL SUPPLY — 22 items
BAG DRAIN URO-CYSTO SKYTR STRL (DRAIN) ×2 IMPLANT
CATH INTERMIT  6FR 70CM (CATHETERS) IMPLANT
CATH URET 5FR 28IN CONE TIP (BALLOONS)
CATH URET 5FR 70CM CONE TIP (BALLOONS) IMPLANT
CLOTH BEACON ORANGE TIMEOUT ST (SAFETY) ×2 IMPLANT
GLOVE BIO SURGEON STRL SZ8 (GLOVE) ×2 IMPLANT
GLOVE BIOGEL PI IND STRL 6.5 (GLOVE) ×2 IMPLANT
GLOVE BIOGEL PI INDICATOR 6.5 (GLOVE) ×2
GOWN STRL REUS W/ TWL XL LVL3 (GOWN DISPOSABLE) ×1 IMPLANT
GOWN STRL REUS W/TWL XL LVL3 (GOWN DISPOSABLE) ×1
GUIDEWIRE 0.038 PTFE COATED (WIRE) ×2 IMPLANT
GUIDEWIRE ANG ZIPWIRE 038X150 (WIRE) IMPLANT
GUIDEWIRE STR DUAL SENSOR (WIRE) IMPLANT
INFUSOR MANOMETER BAG 3000ML (MISCELLANEOUS) IMPLANT
IV NS IRRIG 3000ML ARTHROMATIC (IV SOLUTION) IMPLANT
KIT TURNOVER CYSTO (KITS) ×2 IMPLANT
MANIFOLD NEPTUNE II (INSTRUMENTS) ×2 IMPLANT
NS IRRIG 500ML POUR BTL (IV SOLUTION) ×2 IMPLANT
PACK CYSTO (CUSTOM PROCEDURE TRAY) ×2 IMPLANT
TUBE CONNECTING 12X1/4 (SUCTIONS) ×2 IMPLANT
TUBING UROLOGY SET (TUBING) ×2 IMPLANT
WATER STERILE IRR 3000ML UROMA (IV SOLUTION) ×2 IMPLANT

## 2018-08-02 NOTE — Discharge Instructions (Signed)
Post stent placement instructions   Definitions:  Ureter: The duct that transports urine from the kidney to the bladder. Stent: A plastic hollow tube that is placed into the ureter, from the kidney to the bladder to prevent the ureter from swelling shut.  General instructions:  Despite the fact that no skin incisions were used, the area around the ureter and bladder is raw and irritated. The stent is a foreign body which can further irritate the bladder wall. This irritation is manifested by increased frequency of urination, both day and night, and by an increase in the urge to urinate. In some, the urge to urinate is present almost always. Sometimes the urge is strong enough that you may not be able to stop your self from urinating. This can often be controlled with medication but does not occur in everyone. A stent can safely be left in place for 3 months or greater.  You may see some blood in your urine while the stent is in place and a few days afterward. Do not be alarmed, even if the urine is clear for a while. Get off your feet and drink lots of fluids until clearing occurs. If you start to pass clots or don't improve, call us.  Diet:  You may return to your normal diet immediately. Because of the raw surface of your bladder, alcohol, spicy foods, foods high in acid and drinks with caffeine may cause irritation or frequency and should be used in moderation. To keep your urine flowing freely and avoid constipation, drink plenty of fluids during the day (8-10 glasses). Tip: Avoid cranberry juice because it is very acidic.  Activity:  Your physical activity doesn't need to be restricted. However, if you are very active, you may see some blood in the urine. We suggest that you reduce your activity under the circumstances until the bleeding has stopped.  Bowels:  It is important to keep your bowels regular during the postoperative period. Straining with bowel movements can cause bleeding. A  bowel movement every other day is reasonable. Use a mild laxative if needed, such as milk of magnesia 2-3 tablespoons, or 2 Dulcolax tablets. Call if you continue to have problems. If you had been taking narcotics for pain, before, during or after your surgery, you may be constipated. Take a laxative if necessary.  Medication:  You should resume your pre-surgery medications unless told not to. In addition you may be given an antibiotic to prevent or treat infection. Antibiotics are not always necessary. All medication should be taken as prescribed until the bottles are finished unless you are having an unusual reaction to one of the drugs.  Problems you should report to us:  a. Fever greater than 101F. b. Heavy bleeding, or clots (see notes above about blood in urine). c. Inability to urinate. d. Drug reactions (hives, rash, nausea, vomiting, diarrhea). e. Severe burning or pain with urination that is not improving.   Post Anesthesia Home Care Instructions  Activity: Get plenty of rest for the remainder of the day. A responsible individual must stay with you for 24 hours following the procedure.  For the next 24 hours, DO NOT: -Drive a car -Operate machinery -Drink alcoholic beverages -Take any medication unless instructed by your physician -Make any legal decisions or sign important papers.  Meals: Start with liquid foods such as gelatin or soup. Progress to regular foods as tolerated. Avoid greasy, spicy, heavy foods. If nausea and/or vomiting occur, drink only clear liquids until the nausea   and/or vomiting subsides. Call your physician if vomiting continues.  Special Instructions/Symptoms: Your throat may feel dry or sore from the anesthesia or the breathing tube placed in your throat during surgery. If this causes discomfort, gargle with warm salt water. The discomfort should disappear within 24 hours.  If you had a scopolamine patch placed behind your ear for the management of  post- operative nausea and/or vomiting:  1. The medication in the patch is effective for 72 hours, after which it should be removed.  Wrap patch in a tissue and discard in the trash. Wash hands thoroughly with soap and water. 2. You may remove the patch earlier than 72 hours if you experience unpleasant side effects which may include dry mouth, dizziness or visual disturbances. 3. Avoid touching the patch. Wash your hands with soap and water after contact with the patch.        

## 2018-08-02 NOTE — Transfer of Care (Signed)
Immediate Anesthesia Transfer of Care Note  Patient: Kristina Torres  Procedure(s) Performed: Procedure(s) (LRB): CYSTOSCOPY WITH STENT REMOVAL AND REPLACEMENT (Right)  Patient Location: PACU  Anesthesia Type: General  Level of Consciousness: awake, oriented, sedated and patient cooperative  Airway & Oxygen Therapy: Patient Spontanous Breathing and Patient connected to face mask oxygen  Post-op Assessment: Report given to PACU RN and Post -op Vital signs reviewed and stable  Post vital signs: Reviewed and stable  Complications: No apparent anesthesia complications Last Vitals:  Vitals Value Taken Time  BP 108/58 08/02/2018  9:46 AM  Temp 36.3 C 08/02/2018  9:46 AM  Pulse 73 08/02/2018  9:52 AM  Resp 18 08/02/2018  9:52 AM  SpO2 99 % 08/02/2018  9:52 AM  Vitals shown include unvalidated device data.  Last Pain:  Vitals:   08/02/18 0738  TempSrc: Oral

## 2018-08-02 NOTE — Anesthesia Preprocedure Evaluation (Signed)
Anesthesia Evaluation  Patient identified by MRN, date of birth, ID band Patient awake    Reviewed: Allergy & Precautions, NPO status , Patient's Chart, lab work & pertinent test results  Airway Mallampati: II  TM Distance: >3 FB Neck ROM: Full    Dental   Pulmonary former smoker,    Pulmonary exam normal        Cardiovascular hypertension, Pt. on medications Normal cardiovascular exam     Neuro/Psych    GI/Hepatic   Endo/Other    Renal/GU      Musculoskeletal   Abdominal   Peds  Hematology   Anesthesia Other Findings   Reproductive/Obstetrics                             Anesthesia Physical Anesthesia Plan  ASA: III  Anesthesia Plan: General   Post-op Pain Management:    Induction: Intravenous  PONV Risk Score and Plan: 3 and Ondansetron and Midazolam  Airway Management Planned: LMA  Additional Equipment:   Intra-op Plan:   Post-operative Plan: Extubation in OR  Informed Consent: I have reviewed the patients History and Physical, chart, labs and discussed the procedure including the risks, benefits and alternatives for the proposed anesthesia with the patient or authorized representative who has indicated his/her understanding and acceptance.     Plan Discussed with: CRNA and Surgeon  Anesthesia Plan Comments:         Anesthesia Quick Evaluation

## 2018-08-02 NOTE — Anesthesia Postprocedure Evaluation (Signed)
Anesthesia Post Note  Patient: Kristina Torres  Procedure(s) Performed: CYSTOSCOPY WITH STENT REMOVAL AND REPLACEMENT (Right Ureter)     Patient location during evaluation: PACU Anesthesia Type: General Level of consciousness: awake and alert Pain management: pain level controlled Vital Signs Assessment: post-procedure vital signs reviewed and stable Respiratory status: spontaneous breathing, nonlabored ventilation, respiratory function stable and patient connected to nasal cannula oxygen Cardiovascular status: blood pressure returned to baseline and stable Postop Assessment: no apparent nausea or vomiting Anesthetic complications: no    Last Vitals:  Vitals:   08/02/18 1045 08/02/18 1115  BP: 121/76 135/74  Pulse: 62 (!) 56  Resp: 15 16  Temp:  (!) 36.3 C  SpO2: 98% 99%    Last Pain:  Vitals:   08/02/18 1115  TempSrc:   PainSc: 0-No pain                 Marquee Fuchs DAVID

## 2018-08-02 NOTE — Op Note (Signed)
PATIENT:  Kristina Torres  PRE-OPERATIVE DIAGNOSIS: Right ureteral obstruction  POST-OPERATIVE DIAGNOSIS: Same  PROCEDURE: 1. cystoscopy with right stent removal. 2.  Right retrograde pyelogram with interpretation. 3.  Right double-J stent placement. 4.  Fluoroscopy time less than 1 hour  SURGEON:  Claybon Jabs  INDICATION: [name] is a 72 year old female who was found to have right hydronephrosis in 8/18.  This was secondary to obstruction due to biopsy-proven diffuse large B cell lymphoma with resulting right retroperitoneal lymphadenopathy.  She was initially managed with a right nephrostomy tube and then converted to a double-J stent.  Her last stent change was on 02/18/2018.  She returns for stent change today.  She has been bothered somewhat by the stent causing irritation of her bladder.  This was improved with Toviaz but the medication was noted be pretty expensive.  Pyridium also helps.  ANESTHESIA:  General  EBL:  Minimal  DRAINS: 6 French, 24 cm Polaris stent in the right ureter.  LOCAL MEDICATIONS USED:  None  Description of procedure: After informed consent the patient was taken to the operating room and placed on the table in a supine position. General anesthesia was then administered. Once fully anesthetized the patient was moved to the dorsal lithotomy position and the genitalia were sterilely prepped and draped in standard fashion. An official timeout was then performed.  The 23 French cystoscope was advanced into the bladder and the bladder was fully and systematically inspected there were no tumors, stones or inflammatory lesions.  The right ureteral stent was noted exiting the right ureteral orifice and had mild encrustation.  The alligator forceps were used to grasp the tip of the ureteral stent and this was withdrawn through the urethral meatus.  I then passed a 0.038 inch sensor guidewire through the stent and it passed easily into the area the renal pelvis and  the stent was removed leaving the guidewire in place.  I passed a 6 [place] open-ended ureteral catheter over the guidewire and into the right ureteral orifice and up the right ureter for a short distance and then remove the guidewire.  I then performed a right retrograde pyelogram by injecting full-strength Omnipaque contrast through the open-ended catheter and up the right ureter.  It was slightly irregular up to about the junction between the proximal 1/4th portion of the ureter where I noted what appeared to be some stricturing.  Beyond this the intrarenal collecting system was filled and was noted to be dilated but had no filling defects.  I then backed the ureteral catheter down the ureter and watched for contrast to pass out of the kidney however I did not see any contrast passing down the ureter.  I then passed the guidewire through the open-ended catheter and into the renal pelvis and removed the open-ended catheter.  The stent was then loaded on the guidewire and advanced up the guidewire using the pusher.  I began removing the guidewire and noted good curl in the renal pelvis.  The bladder was then drained, the cystoscope was removed and the patient was taken to the recovery room in stable and satisfactory condition.  She tolerated the procedure well with no intraoperative complications. PLAN OF CARE: Discharge to home after PACU  PATIENT DISPOSITION:  PACU - hemodynamically stable.

## 2018-08-02 NOTE — Anesthesia Procedure Notes (Signed)
Procedure Name: LMA Insertion Date/Time: 08/02/2018 9:08 AM Performed by: Suan Halter, CRNA Pre-anesthesia Checklist: Patient identified, Emergency Drugs available, Suction available and Patient being monitored Patient Re-evaluated:Patient Re-evaluated prior to induction Oxygen Delivery Method: Circle system utilized Preoxygenation: Pre-oxygenation with 100% oxygen Induction Type: IV induction Ventilation: Mask ventilation without difficulty LMA: LMA inserted LMA Size: 4.0 and 3.0 Number of attempts: 1 Airway Equipment and Method: Bite block Placement Confirmation: positive ETCO2 Tube secured with: Tape Dental Injury: Teeth and Oropharynx as per pre-operative assessment

## 2018-08-05 ENCOUNTER — Encounter (HOSPITAL_BASED_OUTPATIENT_CLINIC_OR_DEPARTMENT_OTHER): Payer: Self-pay | Admitting: Urology

## 2018-08-05 DIAGNOSIS — M62552 Muscle wasting and atrophy, not elsewhere classified, left thigh: Secondary | ICD-10-CM | POA: Diagnosis not present

## 2018-08-05 DIAGNOSIS — M62551 Muscle wasting and atrophy, not elsewhere classified, right thigh: Secondary | ICD-10-CM | POA: Diagnosis not present

## 2018-08-05 DIAGNOSIS — C8590 Non-Hodgkin lymphoma, unspecified, unspecified site: Secondary | ICD-10-CM | POA: Diagnosis not present

## 2018-08-05 DIAGNOSIS — R2689 Other abnormalities of gait and mobility: Secondary | ICD-10-CM | POA: Diagnosis not present

## 2018-08-09 DIAGNOSIS — M62552 Muscle wasting and atrophy, not elsewhere classified, left thigh: Secondary | ICD-10-CM | POA: Diagnosis not present

## 2018-08-09 DIAGNOSIS — M62551 Muscle wasting and atrophy, not elsewhere classified, right thigh: Secondary | ICD-10-CM | POA: Diagnosis not present

## 2018-08-09 DIAGNOSIS — R2689 Other abnormalities of gait and mobility: Secondary | ICD-10-CM | POA: Diagnosis not present

## 2018-08-09 DIAGNOSIS — C8590 Non-Hodgkin lymphoma, unspecified, unspecified site: Secondary | ICD-10-CM | POA: Diagnosis not present

## 2018-08-12 DIAGNOSIS — R2689 Other abnormalities of gait and mobility: Secondary | ICD-10-CM | POA: Diagnosis not present

## 2018-08-12 DIAGNOSIS — C8590 Non-Hodgkin lymphoma, unspecified, unspecified site: Secondary | ICD-10-CM | POA: Diagnosis not present

## 2018-08-12 DIAGNOSIS — M62551 Muscle wasting and atrophy, not elsewhere classified, right thigh: Secondary | ICD-10-CM | POA: Diagnosis not present

## 2018-08-12 DIAGNOSIS — M62552 Muscle wasting and atrophy, not elsewhere classified, left thigh: Secondary | ICD-10-CM | POA: Diagnosis not present

## 2018-08-14 DIAGNOSIS — M62552 Muscle wasting and atrophy, not elsewhere classified, left thigh: Secondary | ICD-10-CM | POA: Diagnosis not present

## 2018-08-14 DIAGNOSIS — M62551 Muscle wasting and atrophy, not elsewhere classified, right thigh: Secondary | ICD-10-CM | POA: Diagnosis not present

## 2018-08-14 DIAGNOSIS — R2689 Other abnormalities of gait and mobility: Secondary | ICD-10-CM | POA: Diagnosis not present

## 2018-08-14 DIAGNOSIS — C8590 Non-Hodgkin lymphoma, unspecified, unspecified site: Secondary | ICD-10-CM | POA: Diagnosis not present

## 2018-08-23 DIAGNOSIS — M62551 Muscle wasting and atrophy, not elsewhere classified, right thigh: Secondary | ICD-10-CM | POA: Diagnosis not present

## 2018-08-23 DIAGNOSIS — M62552 Muscle wasting and atrophy, not elsewhere classified, left thigh: Secondary | ICD-10-CM | POA: Diagnosis not present

## 2018-08-23 DIAGNOSIS — R2689 Other abnormalities of gait and mobility: Secondary | ICD-10-CM | POA: Diagnosis not present

## 2018-08-23 DIAGNOSIS — C8590 Non-Hodgkin lymphoma, unspecified, unspecified site: Secondary | ICD-10-CM | POA: Diagnosis not present

## 2018-08-28 DIAGNOSIS — M62551 Muscle wasting and atrophy, not elsewhere classified, right thigh: Secondary | ICD-10-CM | POA: Diagnosis not present

## 2018-08-28 DIAGNOSIS — C8590 Non-Hodgkin lymphoma, unspecified, unspecified site: Secondary | ICD-10-CM | POA: Diagnosis not present

## 2018-08-28 DIAGNOSIS — R2689 Other abnormalities of gait and mobility: Secondary | ICD-10-CM | POA: Diagnosis not present

## 2018-08-28 DIAGNOSIS — M62552 Muscle wasting and atrophy, not elsewhere classified, left thigh: Secondary | ICD-10-CM | POA: Diagnosis not present

## 2018-08-30 DIAGNOSIS — M62551 Muscle wasting and atrophy, not elsewhere classified, right thigh: Secondary | ICD-10-CM | POA: Diagnosis not present

## 2018-08-30 DIAGNOSIS — C8590 Non-Hodgkin lymphoma, unspecified, unspecified site: Secondary | ICD-10-CM | POA: Diagnosis not present

## 2018-08-30 DIAGNOSIS — M62552 Muscle wasting and atrophy, not elsewhere classified, left thigh: Secondary | ICD-10-CM | POA: Diagnosis not present

## 2018-08-30 DIAGNOSIS — R2689 Other abnormalities of gait and mobility: Secondary | ICD-10-CM | POA: Diagnosis not present

## 2018-09-04 DIAGNOSIS — M62551 Muscle wasting and atrophy, not elsewhere classified, right thigh: Secondary | ICD-10-CM | POA: Diagnosis not present

## 2018-09-04 DIAGNOSIS — C8590 Non-Hodgkin lymphoma, unspecified, unspecified site: Secondary | ICD-10-CM | POA: Diagnosis not present

## 2018-09-04 DIAGNOSIS — R2689 Other abnormalities of gait and mobility: Secondary | ICD-10-CM | POA: Diagnosis not present

## 2018-09-04 DIAGNOSIS — M62552 Muscle wasting and atrophy, not elsewhere classified, left thigh: Secondary | ICD-10-CM | POA: Diagnosis not present

## 2018-09-06 DIAGNOSIS — M62551 Muscle wasting and atrophy, not elsewhere classified, right thigh: Secondary | ICD-10-CM | POA: Diagnosis not present

## 2018-09-06 DIAGNOSIS — R2689 Other abnormalities of gait and mobility: Secondary | ICD-10-CM | POA: Diagnosis not present

## 2018-09-06 DIAGNOSIS — C8590 Non-Hodgkin lymphoma, unspecified, unspecified site: Secondary | ICD-10-CM | POA: Diagnosis not present

## 2018-09-06 DIAGNOSIS — M62552 Muscle wasting and atrophy, not elsewhere classified, left thigh: Secondary | ICD-10-CM | POA: Diagnosis not present

## 2018-09-13 DIAGNOSIS — M62552 Muscle wasting and atrophy, not elsewhere classified, left thigh: Secondary | ICD-10-CM | POA: Diagnosis not present

## 2018-09-13 DIAGNOSIS — R2689 Other abnormalities of gait and mobility: Secondary | ICD-10-CM | POA: Diagnosis not present

## 2018-09-13 DIAGNOSIS — M62551 Muscle wasting and atrophy, not elsewhere classified, right thigh: Secondary | ICD-10-CM | POA: Diagnosis not present

## 2018-09-13 DIAGNOSIS — C8590 Non-Hodgkin lymphoma, unspecified, unspecified site: Secondary | ICD-10-CM | POA: Diagnosis not present

## 2018-09-16 DIAGNOSIS — R2689 Other abnormalities of gait and mobility: Secondary | ICD-10-CM | POA: Diagnosis not present

## 2018-09-16 DIAGNOSIS — M62552 Muscle wasting and atrophy, not elsewhere classified, left thigh: Secondary | ICD-10-CM | POA: Diagnosis not present

## 2018-09-16 DIAGNOSIS — C8590 Non-Hodgkin lymphoma, unspecified, unspecified site: Secondary | ICD-10-CM | POA: Diagnosis not present

## 2018-09-16 DIAGNOSIS — M62551 Muscle wasting and atrophy, not elsewhere classified, right thigh: Secondary | ICD-10-CM | POA: Diagnosis not present

## 2018-09-18 DIAGNOSIS — C8590 Non-Hodgkin lymphoma, unspecified, unspecified site: Secondary | ICD-10-CM | POA: Diagnosis not present

## 2018-09-18 DIAGNOSIS — M62552 Muscle wasting and atrophy, not elsewhere classified, left thigh: Secondary | ICD-10-CM | POA: Diagnosis not present

## 2018-09-18 DIAGNOSIS — M62551 Muscle wasting and atrophy, not elsewhere classified, right thigh: Secondary | ICD-10-CM | POA: Diagnosis not present

## 2018-09-18 DIAGNOSIS — R2689 Other abnormalities of gait and mobility: Secondary | ICD-10-CM | POA: Diagnosis not present

## 2018-09-30 DIAGNOSIS — M62552 Muscle wasting and atrophy, not elsewhere classified, left thigh: Secondary | ICD-10-CM | POA: Diagnosis not present

## 2018-09-30 DIAGNOSIS — R2689 Other abnormalities of gait and mobility: Secondary | ICD-10-CM | POA: Diagnosis not present

## 2018-09-30 DIAGNOSIS — C8590 Non-Hodgkin lymphoma, unspecified, unspecified site: Secondary | ICD-10-CM | POA: Diagnosis not present

## 2018-09-30 DIAGNOSIS — M62551 Muscle wasting and atrophy, not elsewhere classified, right thigh: Secondary | ICD-10-CM | POA: Diagnosis not present

## 2018-10-01 DIAGNOSIS — C8330 Diffuse large B-cell lymphoma, unspecified site: Secondary | ICD-10-CM | POA: Diagnosis not present

## 2018-10-02 DIAGNOSIS — M62551 Muscle wasting and atrophy, not elsewhere classified, right thigh: Secondary | ICD-10-CM | POA: Diagnosis not present

## 2018-10-02 DIAGNOSIS — R2689 Other abnormalities of gait and mobility: Secondary | ICD-10-CM | POA: Diagnosis not present

## 2018-10-02 DIAGNOSIS — M62552 Muscle wasting and atrophy, not elsewhere classified, left thigh: Secondary | ICD-10-CM | POA: Diagnosis not present

## 2018-10-02 DIAGNOSIS — C8590 Non-Hodgkin lymphoma, unspecified, unspecified site: Secondary | ICD-10-CM | POA: Diagnosis not present

## 2018-10-02 DIAGNOSIS — C8338 Diffuse large B-cell lymphoma, lymph nodes of multiple sites: Secondary | ICD-10-CM | POA: Diagnosis not present

## 2018-10-02 DIAGNOSIS — C8330 Diffuse large B-cell lymphoma, unspecified site: Secondary | ICD-10-CM | POA: Diagnosis not present

## 2018-10-02 DIAGNOSIS — Z9221 Personal history of antineoplastic chemotherapy: Secondary | ICD-10-CM | POA: Diagnosis not present

## 2018-10-08 DIAGNOSIS — R3989 Other symptoms and signs involving the genitourinary system: Secondary | ICD-10-CM | POA: Diagnosis not present

## 2018-10-08 DIAGNOSIS — E785 Hyperlipidemia, unspecified: Secondary | ICD-10-CM | POA: Diagnosis not present

## 2018-10-08 DIAGNOSIS — E669 Obesity, unspecified: Secondary | ICD-10-CM | POA: Diagnosis not present

## 2018-10-08 DIAGNOSIS — Z1231 Encounter for screening mammogram for malignant neoplasm of breast: Secondary | ICD-10-CM | POA: Diagnosis not present

## 2018-10-08 DIAGNOSIS — M109 Gout, unspecified: Secondary | ICD-10-CM | POA: Diagnosis not present

## 2018-10-08 DIAGNOSIS — R32 Unspecified urinary incontinence: Secondary | ICD-10-CM | POA: Diagnosis not present

## 2018-10-08 DIAGNOSIS — M069 Rheumatoid arthritis, unspecified: Secondary | ICD-10-CM | POA: Diagnosis not present

## 2018-10-08 DIAGNOSIS — Z6830 Body mass index (BMI) 30.0-30.9, adult: Secondary | ICD-10-CM | POA: Diagnosis not present

## 2018-10-29 DIAGNOSIS — J439 Emphysema, unspecified: Secondary | ICD-10-CM | POA: Diagnosis not present

## 2018-10-29 DIAGNOSIS — Z23 Encounter for immunization: Secondary | ICD-10-CM | POA: Diagnosis not present

## 2018-10-29 DIAGNOSIS — K5730 Diverticulosis of large intestine without perforation or abscess without bleeding: Secondary | ICD-10-CM | POA: Diagnosis not present

## 2018-10-29 DIAGNOSIS — D1809 Hemangioma of other sites: Secondary | ICD-10-CM | POA: Diagnosis not present

## 2018-10-29 DIAGNOSIS — D1803 Hemangioma of intra-abdominal structures: Secondary | ICD-10-CM | POA: Diagnosis not present

## 2018-10-29 DIAGNOSIS — D259 Leiomyoma of uterus, unspecified: Secondary | ICD-10-CM | POA: Diagnosis not present

## 2018-10-29 DIAGNOSIS — C8330 Diffuse large B-cell lymphoma, unspecified site: Secondary | ICD-10-CM | POA: Diagnosis not present

## 2018-10-29 DIAGNOSIS — M2580 Other specified joint disorders, unspecified joint: Secondary | ICD-10-CM | POA: Diagnosis not present

## 2018-10-29 DIAGNOSIS — I700 Atherosclerosis of aorta: Secondary | ICD-10-CM | POA: Diagnosis not present

## 2018-11-01 ENCOUNTER — Other Ambulatory Visit: Payer: Self-pay

## 2018-11-02 DIAGNOSIS — S46012A Strain of muscle(s) and tendon(s) of the rotator cuff of left shoulder, initial encounter: Secondary | ICD-10-CM | POA: Diagnosis not present

## 2018-11-02 DIAGNOSIS — M75122 Complete rotator cuff tear or rupture of left shoulder, not specified as traumatic: Secondary | ICD-10-CM | POA: Diagnosis not present

## 2018-11-02 DIAGNOSIS — W19XXXA Unspecified fall, initial encounter: Secondary | ICD-10-CM | POA: Diagnosis not present

## 2018-11-02 DIAGNOSIS — M25512 Pain in left shoulder: Secondary | ICD-10-CM | POA: Diagnosis not present

## 2018-11-05 DIAGNOSIS — E785 Hyperlipidemia, unspecified: Secondary | ICD-10-CM | POA: Diagnosis not present

## 2018-11-05 DIAGNOSIS — Z1231 Encounter for screening mammogram for malignant neoplasm of breast: Secondary | ICD-10-CM | POA: Diagnosis not present

## 2018-11-07 DIAGNOSIS — N390 Urinary tract infection, site not specified: Secondary | ICD-10-CM | POA: Diagnosis not present

## 2018-12-03 DIAGNOSIS — R3915 Urgency of urination: Secondary | ICD-10-CM | POA: Diagnosis not present

## 2018-12-03 DIAGNOSIS — M25512 Pain in left shoulder: Secondary | ICD-10-CM | POA: Diagnosis not present

## 2018-12-03 DIAGNOSIS — N131 Hydronephrosis with ureteral stricture, not elsewhere classified: Secondary | ICD-10-CM | POA: Diagnosis not present

## 2018-12-03 DIAGNOSIS — M75102 Unspecified rotator cuff tear or rupture of left shoulder, not specified as traumatic: Secondary | ICD-10-CM | POA: Diagnosis not present

## 2018-12-03 IMAGING — CT CT ABD-PELV W/O CM
2 of 4 series · 16 of 46 positions shown, 18 images · non-contrast
Comparison: MRI 02/10/2010

CLINICAL DATA: Abdominal pain

EXAM:
CT ABDOMEN AND PELVIS WITHOUT CONTRAST
TECHNIQUE: Multidetector CT imaging of the abdomen and pelvis was performed
following the standard protocol without IV contrast.

[Series 3: abd/ pelvis 5.0 i30f 2 · axial · 0.73mm/px · z∈[-567,-187]mm · 13 of 85 slices shown, 15 images]
[im 5/85  soft-tissue]
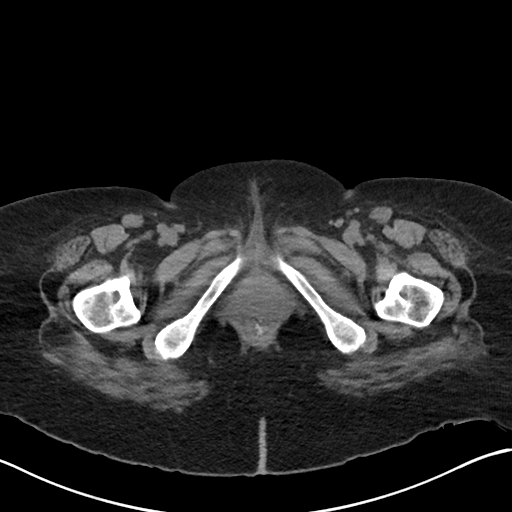
[im 5/85  bone]
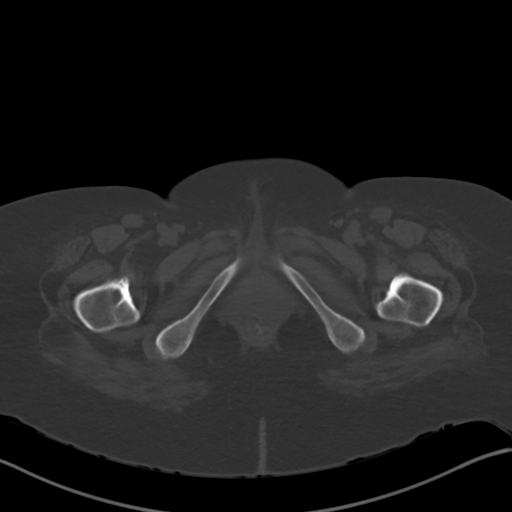
[im 13/85  soft-tissue]
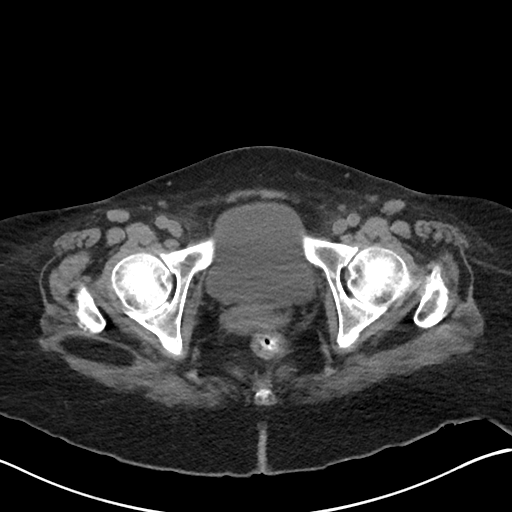
[im 17/85  soft-tissue]
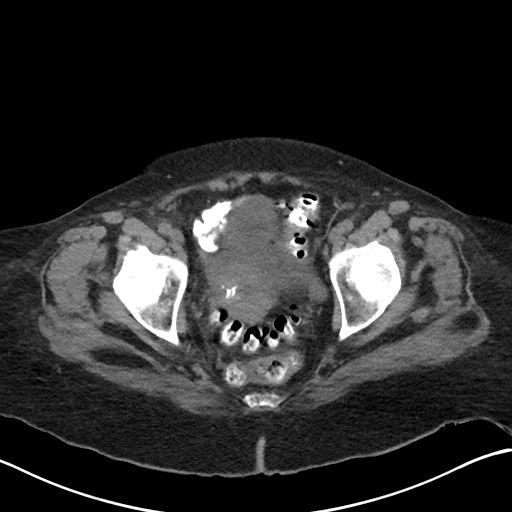
[im 25/85  soft-tissue]
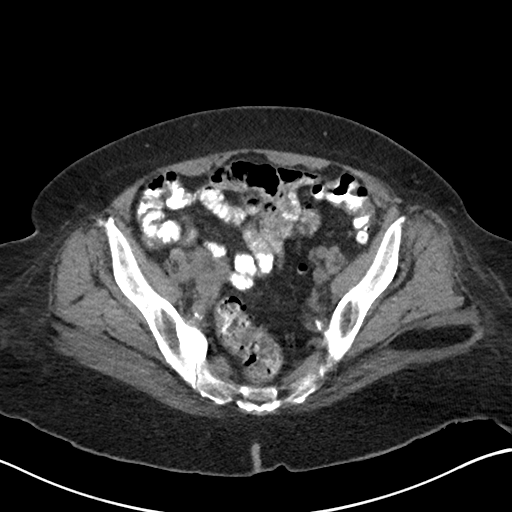
[im 29/85  soft-tissue]
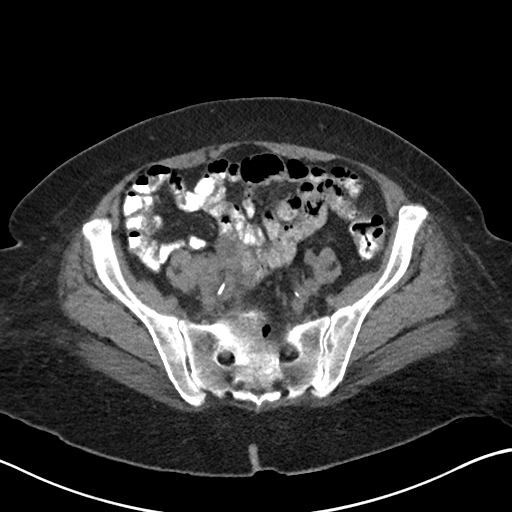
[im 37/85  soft-tissue]
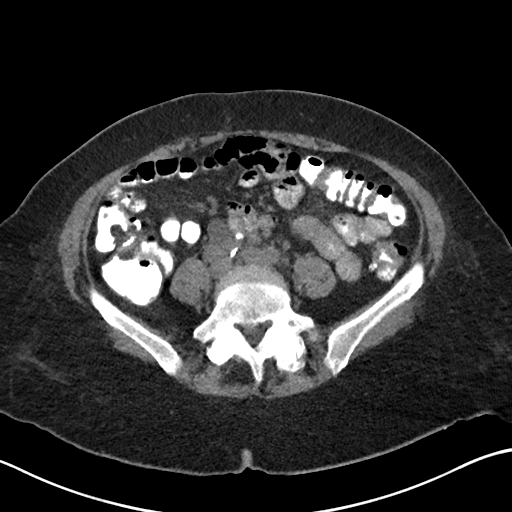
[im 45/85  soft-tissue]
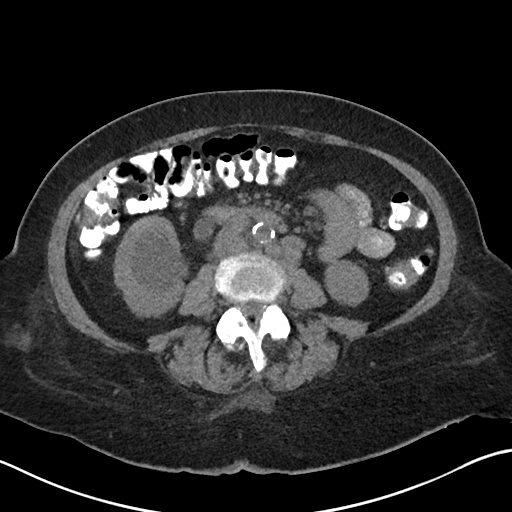
[im 49/85  soft-tissue]
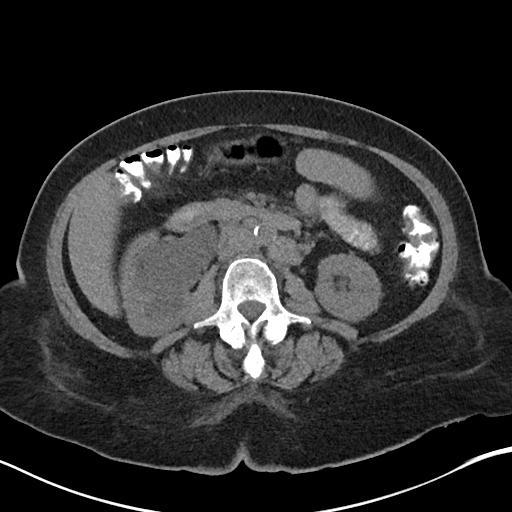
[im 57/85  soft-tissue]
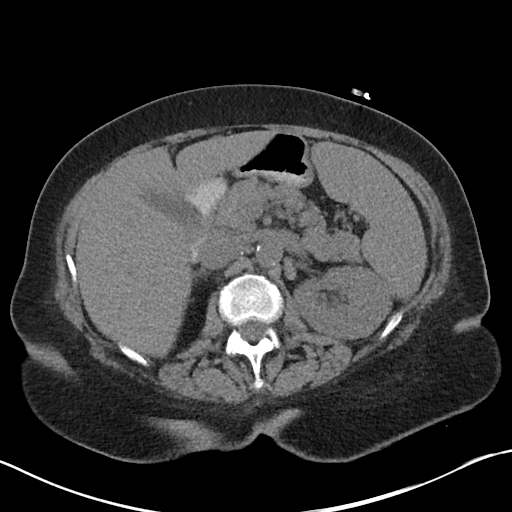
[im 57/85  bone]
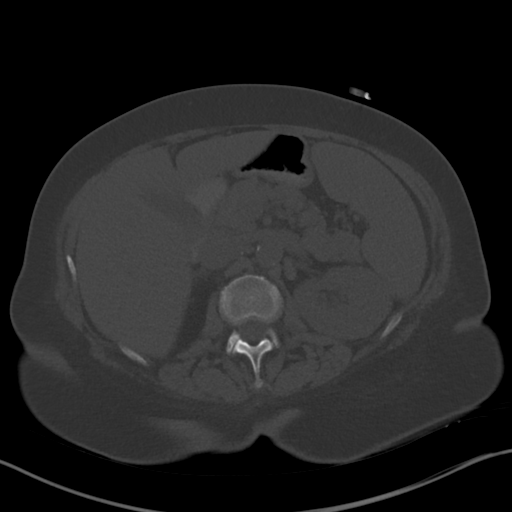
[im 61/85  soft-tissue]
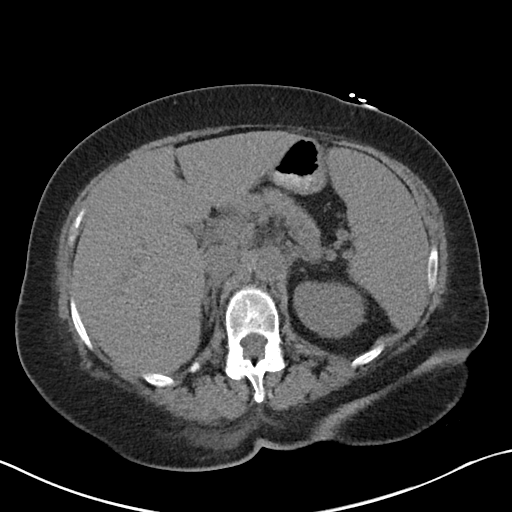
[im 69/85  soft-tissue]
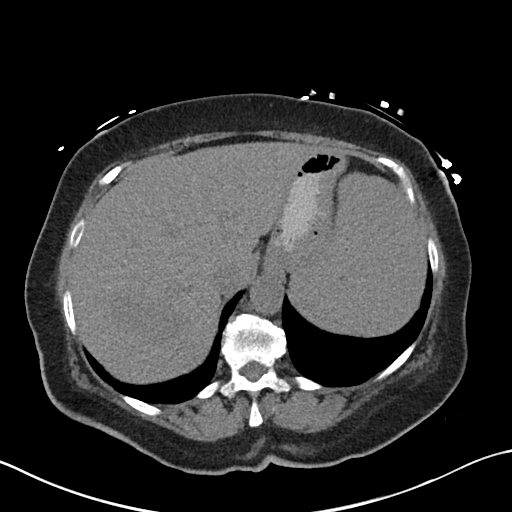
[im 73/85  soft-tissue]
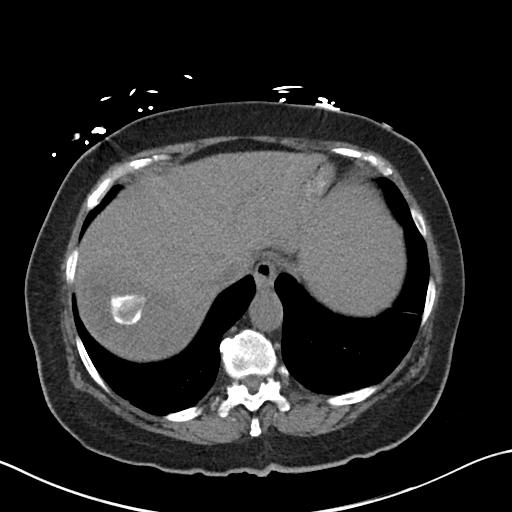
[im 81/85  soft-tissue]
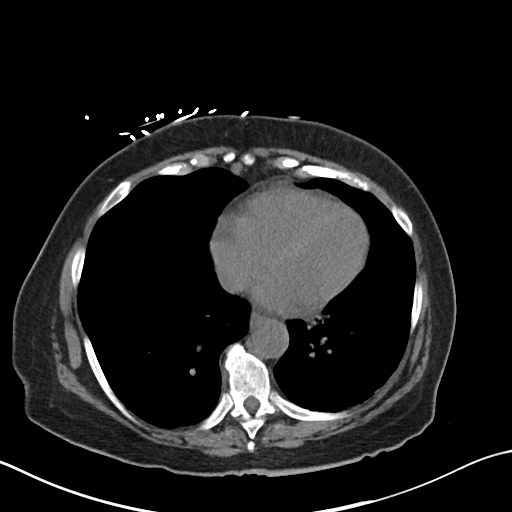

[Series 6: cor st · coronal · 0.64mm/px · 3 of 92 slices shown]
[im 31/92  soft-tissue]
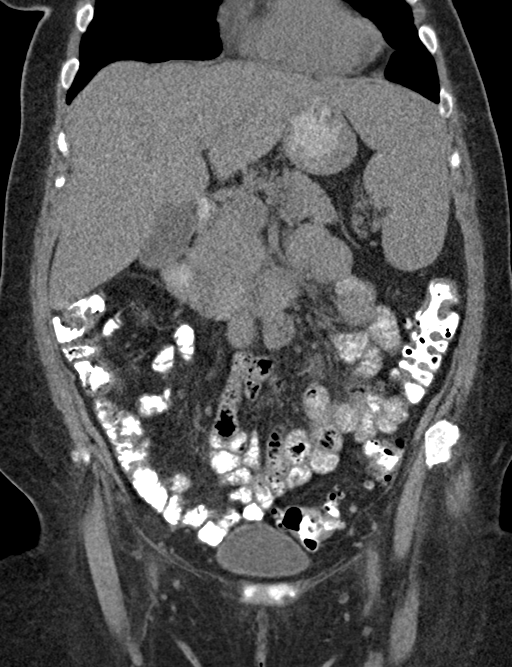
[im 41/92  soft-tissue]
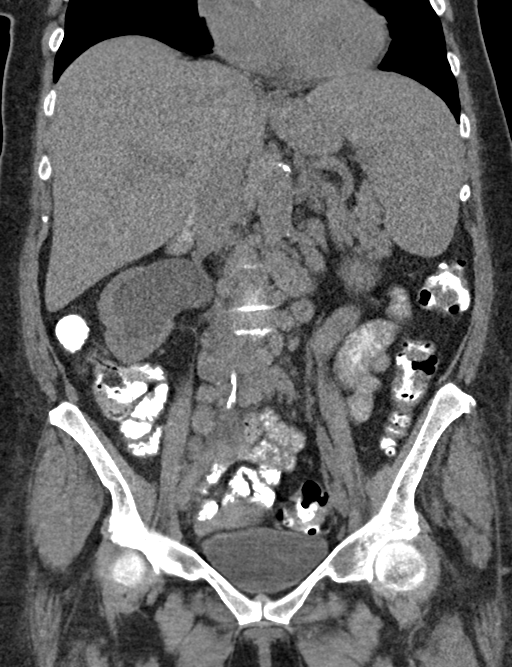
[im 51/92  soft-tissue]
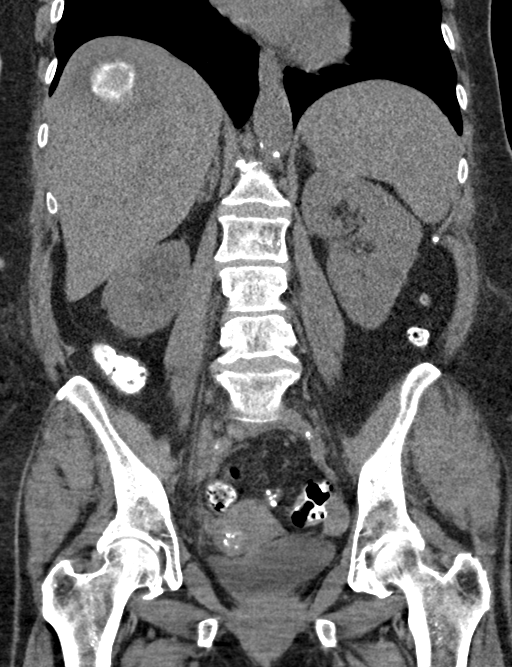

[16 of 46 positions shown; findings below may reference images not displayed]

FINDINGS: Lower chest: Lung bases demonstrate no acute consolidation or
pleural effusion. The heart size is within normal limits.

Hepatobiliary: Vague hypodense mass with central calcification in
the right hepatic lobe measuring at least 8.5 cm and presumably
corresponding to the 6377 demonstrated MRI mass/ thought than to
represent hemangioma. No calcified gallstones. No biliary dilatation

Pancreas: Unremarkable. No pancreatic ductal dilatation or
surrounding inflammatory changes.

Spleen: Enlarged, measuring 14 cm

Adrenals/Urinary Tract: Left adrenal gland is within normal limits.
Possible cyst in the mid to lower pole left kidney.

Severe hydronephrosis and hydroureter of the right kidney with
distal tapering of the ureter. Bladder within normal limits. No
stones are seen

Stomach/Bowel: The stomach is nonenlarged. There is no dilated small
bowel. No colon wall thickening.

Vascular/Lymphatic: Aortic atherosclerosis. Multiple enlarged
periaortic/retroperitoneal lymph nodes, measuring up to 2 cm. Right
greater than left iliac adenopathy.

Reproductive: Calcified uterine fibroids.  No adnexal masses.

Other: Negative for free air or free fluid.

Musculoskeletal: Degenerative changes. No acute or suspicious bone
lesion
IMPRESSION: 1. Vague hypodense mass with central calcification in the right
hepatic lobe presumably corresponding to the MRI mass from 6377
which was thought to represent hemangioma
2. Moderate retroperitoneal somewhat bulky adenopathy. Splenomegaly.
Findings raise concern for metastatic disease or lymphoproliferative
abnormality such as lymphoma. Clinical correlation is recommended
3. Negative for a bowel obstruction or colon wall thickening
4. Severe right hydronephrosis and hydroureter with distal tapering
of the ureter. No definitive stones are seen, findings could be
secondary to a stricture
5. Calcified uterine fibroids

## 2018-12-05 ENCOUNTER — Other Ambulatory Visit: Payer: Self-pay | Admitting: Urology

## 2018-12-13 ENCOUNTER — Encounter (HOSPITAL_BASED_OUTPATIENT_CLINIC_OR_DEPARTMENT_OTHER): Payer: Self-pay

## 2018-12-13 ENCOUNTER — Other Ambulatory Visit: Payer: Self-pay

## 2018-12-13 NOTE — Progress Notes (Signed)
Has a port a cath in place Spoke with:  Danaria NPO:  After Midnight, no gum, candy, or mints  Arrival time: 0530AM Labs:  Istat 8, EKG AM medications:  None Pre op orders: Yes Ride home: Sherre Scarlet (husband) [phone]

## 2018-12-26 DIAGNOSIS — H4302 Vitreous prolapse, left eye: Secondary | ICD-10-CM | POA: Diagnosis not present

## 2018-12-27 DIAGNOSIS — Z01818 Encounter for other preprocedural examination: Secondary | ICD-10-CM | POA: Diagnosis not present

## 2018-12-27 DIAGNOSIS — M25512 Pain in left shoulder: Secondary | ICD-10-CM | POA: Diagnosis not present

## 2018-12-27 DIAGNOSIS — M109 Gout, unspecified: Secondary | ICD-10-CM | POA: Diagnosis not present

## 2018-12-27 DIAGNOSIS — E785 Hyperlipidemia, unspecified: Secondary | ICD-10-CM | POA: Diagnosis not present

## 2018-12-27 DIAGNOSIS — Z79899 Other long term (current) drug therapy: Secondary | ICD-10-CM | POA: Diagnosis not present

## 2018-12-27 DIAGNOSIS — R7309 Other abnormal glucose: Secondary | ICD-10-CM | POA: Diagnosis not present

## 2018-12-28 NOTE — H&P (Signed)
HPI: Kristina Torres is a 73 year-old female with a history of malignant ureteral obstruction.  Her problem was discovered 08/14/2017. Marland Kitchen She had the following imaging studies done: Renal Ultrasound and CT Scan.   She has not had kidney surgery. She has had a stent placed in her kidney. She has not received radiation therapy. This condition would be considered of mild to moderate severity with no modifying factors or associated signs or symptoms other than as noted above.   11/15/17: She is doing well. She is currently receiving chemotherapy. She is scheduled for a CT scan to reassess things again in a week. She has her right nephrostomy tube which has been draining clear urine. She is tolerating the tube well.   02/12/18: She is doing quite well. Urologically she said she does not even know she has a stent in place. She has no voiding symptoms, flank pain or hematuria.    06/12/18: Her last stent exchange was in early March. He presents today for follow-up imaging studies. No interval fevers or abx treatment. Complaining of increased painful urgency and suprapubic pain/discomfort after voiding for the past month or so. This is a new symptom compared to previous assessment prior to her last stent exchange. Denies flank or back pain. Denies fevers. Denies gross hematuria.   12/03/18: She has returned today for follow-up of right ureteral obstruction secondary to lymphoma with retroperitoneal adenopathy. Her last stent change was on 08/02/18 with mild encrustation of the stent noted at the time.  She had reported in 11/19 to Dr. Hinton Rao that she has been having pain which has been present since her stent was changed and is only relieved with sitting down. She also has some spasms at the termination of urination with some urgency and occasional urge incontinence. She describes pain in the perineum which is worse with ambulating. She was felt to continue to be in remission from her lymphoma.      ALLERGIES: Streptomycin    MEDICATIONS: Allopurinol  Cipro  Hydrochlorothiazide 12.5 mg tablet  Myrbetriq 50 mg tablet, extended release 24 hr 1 tablet PO Daily  Simvastatin  Toviaz 4 mg tablet, extended release 24 hr 1 tablet PO Daily  Calcium 1000 + D3 1,000 mg calcium (2,500 mg)-800 unit tablet  Colace 478 mg capsule  Folic Acid  Ondansetron Hcl 4 mg tablet  Pyridium  Tramadol Hcl 50 mg tablet     GU PSH: Cystoscopy Insert Stent, Right - 08/02/2018, Right - 02/18/2018    NON-GU PSH: Appendectomy Carpal tunnel surgery Cataract surgery, Bilateral Exploratory Laparotomy Salpingo Oophorectomy    GU PMH: Urinary Urgency - 06/12/2018 Ureteral obstruction (Stable), Right, She has extrinsic obstruction of her right ureter. I am going to have her nephrostomy tube internalized and then I will have her return in 3 months so we can plan to change her stent and reassess whether her ureteral obstruction has resolved. - 11/15/2017 Hydronephrosis Unspec, Right, She has hydronephrosis on the right-hand side I do see enough renal parenchyma that this is likely not long-standing but further evaluation will be undertaken using a CT scan with and without contrast which can help me evaluate for the presence of calculi, intrinsic lesions or extrinsic compression. - 08/16/2017      PMH Notes: Right ureteral obstruction:  She was seen in my office with new right hydronephrosis. At that time I had recommended cystoscopy with left retrograde pyelogram and possible ureteroscopy in order to determine the cause of her obstruction.  She was subsequently  admitted to the hospital and underwent a right percutaneous nephrostomy tube placement. In addition she has been found to have what appears to be a lymphoproliferative disorder with significant abdominal adenopathy.  At the time of nephrostomy tube change she underwent an antegrade nephrostogram.This study revealed that the site of her obstruction is in the mid  ureteral region at the pelvic brim with the CT scan revealing a tapering, normal ureter distal to this. There was no "goblet sign"to suggest an intraluminal cause of her obstruction however this study revealed a tapering ureter consistent with extrinsic compression. In addition her urine was noted be free of any red cells which would also be consistent with extrinsic compression rather than any intrinsic ureteral lesion.  We then discussed The fact that her ureteral obstruction can be managed over the longer term with internalization of her nephrostomy tube by the placement of a double-J stent in an anterograde fashion by interventional radiology with removal of her nephrostomy tube. I can then manage her stent changes as an outpatient with the eventual hope that the obstruction will respond to chemotherapy and her stent can be eventually removed.      NON-GU PMH: Pyuria/other UA findings, In preparation for her upcoming surgery I will culture her urine. - 02/12/2018 Benign intracranial hypertension Diverticulosis Gout Hypercholesterolemia Rheumatoid Arthritis    FAMILY HISTORY: 1 Daughter - Daughter 1 son - Son   SOCIAL HISTORY: Marital Status: Married Preferred Language: English Current Smoking Status: Patient does not smoke anymore.   Tobacco Use Assessment Completed: Used Tobacco in last 30 days? Drinks 3 caffeinated drinks per day.    REVIEW OF SYSTEMS:    GU Review Female:   Patient denies frequent urination, hard to postpone urination, burning /pain with urination, get up at night to urinate, leakage of urine, stream starts and stops, trouble starting your stream, have to strain to urinate, and being pregnant.  Gastrointestinal (Upper):   Patient denies nausea, vomiting, and indigestion/ heartburn.  Gastrointestinal (Lower):   Patient denies diarrhea and constipation.  Constitutional:   Patient denies fever, night sweats, weight loss, and fatigue.  Skin:   Patient denies skin rash/  lesion and itching.  Eyes:   Patient denies blurred vision and double vision.  Ears/ Nose/ Throat:   Patient denies sore throat and sinus problems.  Hematologic/Lymphatic:   Patient denies swollen glands and easy bruising.  Cardiovascular:   Patient denies leg swelling and chest pains.  Respiratory:   Patient denies shortness of breath and cough.  Endocrine:   Patient denies excessive thirst.  Musculoskeletal:   Patient denies back pain and joint pain.  Neurological:   Patient denies headaches and dizziness.  Psychologic:   Patient denies depression and anxiety.   VITAL SIGNS:    Weight 152 lb / 68.95 kg  Height 59 in / 149.86 cm  BP 129/72 mmHg  Pulse 71 /min  BMI 30.7 kg/m   MULTI-SYSTEM PHYSICAL EXAMINATION:    Constitutional: Well-nourished. No physical deformities. Normally developed. Good grooming.  Respiratory: No labored breathing, no use of accessory muscles.   Cardiovascular: Normal temperature, normal extremity pulses, no swelling, no varicosities.  Skin: No paleness, no jaundice, no cyanosis. No lesion, no ulcer, no rash.  Neurologic / Psychiatric: Oriented to time, oriented to place, oriented to person. No depression, no anxiety, no agitation.  Gastrointestinal: No mass, no tenderness, no rigidity, non obese abdomen. No CVAT, no flank or suprapubic tenderness.  Musculoskeletal: Normal gait and station of head and neck.  PAST DATA REVIEWED:  Source Of History:  Patient  Lab Test Review:   BUN/Creatinine  Records Review:   Previous Hospital Records, Previous Patient Records, POC Tool  X-Ray Review: KUB: Reviewed Films. I compared her previous KUB with today's images. Renal Ultrasound (Limited): Reviewed Films. Previous limited right renal ultrasound images were reviewed and compared with the study. C.T. Chest/ Abd/Pelvis: Reviewed Films. Reviewed Report.    Notes:                     In 8/19 and her creatinine was 0.8. The CT scan done on 10/29/18 revealed her stent to  be in good position with what was read by the radiologist as hydronephrosis but there appears to be very minimal dilatation of the collecting system with a stable left renal cyst identified as well.   PROCEDURES:         Renal Ultrasound (Limited) - 63817  RT Kidney: Length:8.5 cm Depth: 4.5 cm Cortical Width:0.96 cm Width: 5.3 cm    Right Kidney/Ureter:  Hydro noted, ? Echogenic focus UP vs part of stent? = 0.43cm.  Bladder:  PVR= 31.61ML      Her ultrasound reveals no significant hydronephrosis.          KUB - [id number]  A single view of the abdomen is obtained.  Review of her KUB today reveals her stent is in good position and I can appreciate no significant incrustation. There does appear to be some excess stent in the bladder.   ASSESSMENT/PLAN:      ICD-10 Details  1 GU:   Ureteral obstruction - N13.1 Right, Stable - At 4 months now she has no evidence of hydronephrosis or incrustation so in 1 month I will plan to perform a stent change and possibly even removal if she demonstrates no obstruction. Also going to use a shorter stent.  2   Urinary Urgency - R39.15 Stable - She is experiencing some bladder spasms, urgency and some urge incontinence despite Myrbetriq.  Note:   She is 4 ft 11 and I had a 42 [place] stent but this still appears to be too long and this very likely is what is causing a lot of her bladder irritation that she is described so I am going to have on hand a 22 and a 20 [place] stent.

## 2018-12-28 NOTE — Anesthesia Preprocedure Evaluation (Addendum)
Anesthesia Evaluation  Patient identified by MRN, date of birth, ID band Patient awake    Reviewed: Allergy & Precautions, NPO status , Patient's Chart, lab work & pertinent test results  Airway Mallampati: I  TM Distance: >3 FB Neck ROM: Full    Dental   Pulmonary former smoker,    Pulmonary exam normal        Cardiovascular hypertension, Pt. on medications Normal cardiovascular exam  ECHO 9/18 Study Conclusions  - Left ventricle: The cavity size was normal. Wall thickness was   normal. Systolic function was normal. The estimated ejection   fraction was in the range of 60% to 65%. Wall motion was normal;   there were no regional wall motion abnormalities. Doppler   parameters are consistent with abnormal left ventricular   relaxation (grade 1 diastolic dysfunction). - Pulmonary arteries: Systolic pressure was mildly increased. PA   peak pressure: 36 mm Hg (S).   Neuro/Psych  Neuromuscular disease    GI/Hepatic negative GI ROS, Neg liver ROS,   Endo/Other  negative endocrine ROS  Renal/GU Renal diseaseDLBCL (diffuse large B cell lymphoma) (HCC) 09/03/2017 non hodkins lymphoma right kidney and ureter   negative genitourinary   Musculoskeletal  (+) Arthritis ,   Abdominal   Peds  Hematology  (+) Blood dyscrasia, anemia ,   Anesthesia Other Findings   Reproductive/Obstetrics                           Anesthesia Physical  Anesthesia Plan  ASA: III  Anesthesia Plan: General   Post-op Pain Management:    Induction: Intravenous  PONV Risk Score and Plan: 3 and Ondansetron and Midazolam  Airway Management Planned: LMA  Additional Equipment:   Intra-op Plan:   Post-operative Plan: Extubation in OR  Informed Consent: I have reviewed the patients History and Physical, chart, labs and discussed the procedure including the risks, benefits and alternatives for the proposed anesthesia  with the patient or authorized representative who has indicated his/her understanding and acceptance.     Plan Discussed with: CRNA, Surgeon and Anesthesiologist  Anesthesia Plan Comments:         Anesthesia Quick Evaluation

## 2018-12-30 ENCOUNTER — Encounter (HOSPITAL_BASED_OUTPATIENT_CLINIC_OR_DEPARTMENT_OTHER): Payer: Self-pay | Admitting: *Deleted

## 2018-12-30 ENCOUNTER — Ambulatory Visit (HOSPITAL_BASED_OUTPATIENT_CLINIC_OR_DEPARTMENT_OTHER): Payer: Medicare Other | Admitting: Anesthesiology

## 2018-12-30 ENCOUNTER — Ambulatory Visit (HOSPITAL_BASED_OUTPATIENT_CLINIC_OR_DEPARTMENT_OTHER)
Admission: RE | Admit: 2018-12-30 | Discharge: 2018-12-30 | Disposition: A | Discharge status: Home / Self Care | Payer: Medicare Other | Attending: Urology | Admitting: Urology

## 2018-12-30 ENCOUNTER — Encounter (HOSPITAL_BASED_OUTPATIENT_CLINIC_OR_DEPARTMENT_OTHER)
Admission: RE | Disposition: A | Discharge status: Home / Self Care | Payer: Self-pay | Source: Home / Self Care | Attending: Urology

## 2018-12-30 DIAGNOSIS — R3915 Urgency of urination: Secondary | ICD-10-CM | POA: Diagnosis not present

## 2018-12-30 DIAGNOSIS — E7800 Pure hypercholesterolemia, unspecified: Secondary | ICD-10-CM | POA: Insufficient documentation

## 2018-12-30 DIAGNOSIS — E785 Hyperlipidemia, unspecified: Secondary | ICD-10-CM | POA: Diagnosis not present

## 2018-12-30 DIAGNOSIS — Z87891 Personal history of nicotine dependence: Secondary | ICD-10-CM | POA: Insufficient documentation

## 2018-12-30 DIAGNOSIS — D649 Anemia, unspecified: Secondary | ICD-10-CM | POA: Diagnosis not present

## 2018-12-30 DIAGNOSIS — Z9221 Personal history of antineoplastic chemotherapy: Secondary | ICD-10-CM | POA: Diagnosis not present

## 2018-12-30 DIAGNOSIS — M109 Gout, unspecified: Secondary | ICD-10-CM | POA: Insufficient documentation

## 2018-12-30 DIAGNOSIS — I10 Essential (primary) hypertension: Secondary | ICD-10-CM | POA: Insufficient documentation

## 2018-12-30 DIAGNOSIS — Z79899 Other long term (current) drug therapy: Secondary | ICD-10-CM | POA: Diagnosis not present

## 2018-12-30 DIAGNOSIS — T191XXA Foreign body in bladder, initial encounter: Secondary | ICD-10-CM | POA: Diagnosis not present

## 2018-12-30 DIAGNOSIS — N135 Crossing vessel and stricture of ureter without hydronephrosis: Secondary | ICD-10-CM | POA: Diagnosis not present

## 2018-12-30 DIAGNOSIS — Z466 Encounter for fitting and adjustment of urinary device: Secondary | ICD-10-CM | POA: Diagnosis not present

## 2018-12-30 DIAGNOSIS — C8593 Non-Hodgkin lymphoma, unspecified, intra-abdominal lymph nodes: Secondary | ICD-10-CM | POA: Insufficient documentation

## 2018-12-30 HISTORY — DX: Unspecified rotator cuff tear or rupture of unspecified shoulder, not specified as traumatic: M75.100

## 2018-12-30 HISTORY — PX: CYSTOSCOPY W/ URETERAL STENT PLACEMENT: SHX1429

## 2018-12-30 LAB — POCT I-STAT 4, (NA,K, GLUC, HGB,HCT)
Glucose, Bld: 103 mg/dL — ABNORMAL HIGH (ref 70–99)
HCT: 40 % (ref 36.0–46.0)
Hemoglobin: 13.6 g/dL (ref 12.0–15.0)
Potassium: 3.7 mmol/L (ref 3.5–5.1)
Sodium: 142 mmol/L (ref 135–145)

## 2018-12-30 SURGERY — CYSTOSCOPY, FLEXIBLE, WITH STENT REPLACEMENT
Anesthesia: General | Site: Ureter | Laterality: Right

## 2018-12-30 MED ORDER — IOHEXOL 300 MG/ML  SOLN
INTRAMUSCULAR | Status: DC | PRN
  Administered 2018-12-30: 20 mL

## 2018-12-30 MED ORDER — LACTATED RINGERS IV SOLN
INTRAVENOUS | Status: DC
  Administered 2018-12-30: 07:00:00 via INTRAVENOUS
  Filled 2018-12-30: qty 1000

## 2018-12-30 MED ORDER — LIDOCAINE 2% (20 MG/ML) 5 ML SYRINGE
INTRAMUSCULAR | Status: AC
  Filled 2018-12-30: qty 5

## 2018-12-30 MED ORDER — PROPOFOL 10 MG/ML IV BOLUS
INTRAVENOUS | Status: AC
  Filled 2018-12-30: qty 40

## 2018-12-30 MED ORDER — CIPROFLOXACIN IN D5W 200 MG/100ML IV SOLN
INTRAVENOUS | Status: AC
  Filled 2018-12-30: qty 100

## 2018-12-30 MED ORDER — DEXAMETHASONE SODIUM PHOSPHATE 4 MG/ML IJ SOLN
INTRAMUSCULAR | Status: DC | PRN
  Administered 2018-12-30: 10 mg via INTRAVENOUS

## 2018-12-30 MED ORDER — PROPOFOL 10 MG/ML IV BOLUS
INTRAVENOUS | Status: DC | PRN
  Administered 2018-12-30: 120 mg via INTRAVENOUS
  Administered 2018-12-30: 30 mg via INTRAVENOUS

## 2018-12-30 MED ORDER — CIPROFLOXACIN IN D5W 200 MG/100ML IV SOLN
200.0000 mg | Freq: Once | INTRAVENOUS | Status: AC
  Administered 2018-12-30: 200 mg via INTRAVENOUS
  Filled 2018-12-30: qty 100

## 2018-12-30 MED ORDER — FENTANYL CITRATE (PF) 100 MCG/2ML IJ SOLN
INTRAMUSCULAR | Status: AC
  Filled 2018-12-30: qty 2

## 2018-12-30 MED ORDER — LIDOCAINE 2% (20 MG/ML) 5 ML SYRINGE
INTRAMUSCULAR | Status: DC | PRN
  Administered 2018-12-30: 60 mg via INTRAVENOUS
  Administered 2018-12-30: 40 mg via INTRAVENOUS

## 2018-12-30 MED ORDER — FENTANYL CITRATE (PF) 100 MCG/2ML IJ SOLN
INTRAMUSCULAR | Status: DC | PRN
  Administered 2018-12-30 (×2): 50 ug via INTRAVENOUS

## 2018-12-30 MED ORDER — DEXAMETHASONE SODIUM PHOSPHATE 10 MG/ML IJ SOLN
INTRAMUSCULAR | Status: AC
  Filled 2018-12-30: qty 1

## 2018-12-30 MED ORDER — ONDANSETRON HCL 4 MG/2ML IJ SOLN
INTRAMUSCULAR | Status: AC
  Filled 2018-12-30: qty 2

## 2018-12-30 MED ORDER — ONDANSETRON HCL 4 MG/2ML IJ SOLN
INTRAMUSCULAR | Status: DC | PRN
  Administered 2018-12-30: 4 mg via INTRAVENOUS

## 2018-12-30 MED ORDER — ARTIFICIAL TEARS OPHTHALMIC OINT
TOPICAL_OINTMENT | OPHTHALMIC | Status: AC
  Filled 2018-12-30: qty 3.5

## 2018-12-30 MED ORDER — STERILE WATER FOR IRRIGATION IR SOLN
Status: DC | PRN
  Administered 2018-12-30: 3000 mL

## 2018-12-30 SURGICAL SUPPLY — 22 items
BAG DRAIN URO-CYSTO SKYTR STRL (DRAIN) IMPLANT
CATH INTERMIT  6FR 70CM (CATHETERS) ×2 IMPLANT
CATH URET 5FR 28IN CONE TIP (BALLOONS)
CATH URET 5FR 70CM CONE TIP (BALLOONS) IMPLANT
CLOTH BEACON ORANGE TIMEOUT ST (SAFETY) ×2 IMPLANT
GLOVE BIO SURGEON STRL SZ 6.5 (GLOVE) ×2 IMPLANT
GLOVE BIO SURGEON STRL SZ8 (GLOVE) ×2 IMPLANT
GLOVE BIOGEL PI IND STRL 6.5 (GLOVE) ×2 IMPLANT
GLOVE BIOGEL PI INDICATOR 6.5 (GLOVE) ×2
GOWN STRL REUS W/ TWL XL LVL3 (GOWN DISPOSABLE) ×2 IMPLANT
GOWN STRL REUS W/TWL XL LVL3 (GOWN DISPOSABLE) ×2
GUIDEWIRE ANG ZIPWIRE 038X150 (WIRE) IMPLANT
GUIDEWIRE STR DUAL SENSOR (WIRE) IMPLANT
INFUSOR MANOMETER BAG 3000ML (MISCELLANEOUS) IMPLANT
IV NS IRRIG 3000ML ARTHROMATIC (IV SOLUTION) IMPLANT
KIT TURNOVER CYSTO (KITS) ×2 IMPLANT
MANIFOLD NEPTUNE II (INSTRUMENTS) ×2 IMPLANT
NS IRRIG 500ML POUR BTL (IV SOLUTION) IMPLANT
PACK CYSTO (CUSTOM PROCEDURE TRAY) ×2 IMPLANT
TUBE CONNECTING 12X1/4 (SUCTIONS) ×2 IMPLANT
TUBING UROLOGY SET (TUBING) IMPLANT
WATER STERILE IRR 3000ML UROMA (IV SOLUTION) ×2 IMPLANT

## 2018-12-30 NOTE — Anesthesia Postprocedure Evaluation (Signed)
Anesthesia Post Note  Patient: Kristina Torres  Procedure(s) Performed: CYSTOSCOPY, RETROGRADE  WITH STENT REMOVAL (Right Ureter)     Patient location during evaluation: PACU Anesthesia Type: General Level of consciousness: awake and alert Pain management: pain level controlled Vital Signs Assessment: post-procedure vital signs reviewed and stable Respiratory status: spontaneous breathing, nonlabored ventilation, respiratory function stable and patient connected to nasal cannula oxygen Cardiovascular status: blood pressure returned to baseline and stable Postop Assessment: no apparent nausea or vomiting Anesthetic complications: no    Last Vitals:  Vitals:   12/30/18 0900 12/30/18 0938  BP: (!) 153/80 139/89  Pulse: 61 (!) 56  Resp: 13 14  Temp: (!) 36.4 C (!) 36.3 C  SpO2: 98% 99%    Last Pain:  Vitals:   12/30/18 0938  TempSrc: Oral  PainSc:                  Jhostin Epps

## 2018-12-30 NOTE — Transfer of Care (Signed)
  Last Vitals:  Vitals Value Taken Time  BP 149/67 12/30/2018  8:02 AM  Temp    Pulse 70 12/30/2018  8:04 AM  Resp 17 12/30/2018  8:05 AM  SpO2 99 % 12/30/2018  8:04 AM  Vitals shown include unvalidated device data.  Last Pain:  Vitals:   12/30/18 0530  TempSrc: Oral         Immediate Anesthesia Transfer of Care Note  Patient: Kristina Torres  Procedure(s) Performed: Procedure(s) (LRB): CYSTOSCOPY, RETROGRADE  WITH STENT REMOVAL (Right)  Patient Location: PACU  Anesthesia Type: General  Level of Consciousness: awake, alert  and oriented  Airway & Oxygen Therapy: Patient Spontanous Breathing and Patient connected to nasal cannula oxygen  Post-op Assessment: Report given to PACU RN and Post -op Vital signs reviewed and stable  Post vital signs: Reviewed and stable  Complications: No apparent anesthesia complications

## 2018-12-30 NOTE — Op Note (Signed)
PATIENT:  Kristina Torres  PRE-OPERATIVE DIAGNOSIS: Right ureteral obstruction  POST-OPERATIVE DIAGNOSIS: History of right ureteral obstruction  PROCEDURE: 1.  Cystoscopy with right ureteral stent removal. 2.  Right retrograde pyelogram with interpretation. 3.  Fluoroscopy time less than 1 hour  SURGEON:  Claybon Jabs  INDICATION: Kristina Torres is a 73 year old female with a history of malignant right ureteral obstruction that was initially discovered in 8/18.  She has been managed with a right ureteral stent and has received chemotherapy with radiographic resolution of her lymphoma.  Her preoperative KUB and renal ultrasound revealed no encrustation of the stent which was in good position and no hydronephrosis.  ANESTHESIA:  General  EBL:  Minimal  DRAINS: None  LOCAL MEDICATIONS USED:  None  SPECIMEN: None  Description of procedure: After informed consent the patient was taken to the operating room and placed on the table in a supine position. General anesthesia was then administered. Once fully anesthetized the patient was moved to the dorsal lithotomy position and the genitalia were sterilely prepped and draped in standard fashion. An official timeout was then performed.  The 34 French cystoscope with 30 degree lens was advanced through the urethra which was noted to be normal into the bladder.  The bladder was then visually inspected and noted to be free of any tumors, stones or inflammatory lesions other than inflammation surrounding the right ureteral orifice where her right ureteral stent was protruding.  The stent was grasped with alligator forceps and withdrawn through the urethral meatus and then a 0.038 inch sensor guidewire was then passed through the stent.  I noted no difficulty passing the guidewire through the stent indicating no apparent occlusion of the lumen of the stent.  There was some mild encrustation of the portion of the stent located in the bladder but as I  removed the stent I noted no encrustation of the proximal portion.  With the guidewire left in place I passed a 6 [place] open-ended ureteral catheter over the guidewire and then remove the guidewire.  I performed a right retrograde pyelogram by injecting full-strength Omnipaque contrast through the open-ended catheter and into the area the renal pelvis which was noted be normal with no filling defects and no dilation of the calyces.  I then injected contrast as I withdrew the open-ended catheter down the ureter and really did not appreciate any obstruction.  I therefore placed the open-ended catheter at the right ureteral orifice and injected contrast back up the ureter and then drained the bladder and watched as the contrast flowed down the ureter unobstructed.  I performed this procedure a second time and again noted identical findings without apparent obstruction.  I therefore elected not to replace her stent.  I will have her follow-up with me for a right renal ultrasound to assure that she remains free of obstruction.  She tolerated the procedure well with no intraoperative complications.  PLAN OF CARE: Discharge to home after PACU  PATIENT DISPOSITION:  PACU - hemodynamically stable.

## 2018-12-30 NOTE — Discharge Instructions (Signed)
°  Post Anesthesia Home Care Instructions  Activity: Get plenty of rest for the remainder of the day. A responsible individual must stay with you for 24 hours following the procedure.  For the next 24 hours, DO NOT: -Drive a car -Paediatric nurse -Drink alcoholic beverages -Take any medication unless instructed by your physician -Make any legal decisions or sign important papers.  Meals: Start with liquid foods such as gelatin or soup. Progress to regular foods as tolerated. Avoid greasy, spicy, heavy foods. If nausea and/or vomiting occur, drink only clear liquids until the nausea and/or vomiting subsides. Call your physician if vomiting continues.  Special Instructions/Symptoms: Your throat may feel dry or sore from the anesthesia or the breathing tube placed in your throat during surgery. If this causes discomfort, gargle with warm salt water. The discomfort should disappear within 24 hours.  If you had a scopolamine patch placed behind your ear for the management of post- operative nausea and/or vomiting:  1. The medication in the patch is effective for 72 hours, after which it should be removed.  Wrap patch in a tissue and discard in the trash. Wash hands thoroughly with soap and water. 2. You may remove the patch earlier than 72 hours if you experience unpleasant side effects which may include dry mouth, dizziness or visual disturbances. 3. Avoid touching the patch. Wash your hands with soap and water after contact with the patch.    Cystoscopy patient instructions  Following a cystoscopy, a catheter (a flexible rubber tube) is sometimes left in place to empty the bladder. This may cause some discomfort or a feeling that you need to urinate. Your doctor determines the period of time that the catheter will be left in place. You may have bloody urine for two to three days (Call your doctor if the amount of bleeding increases or does not subside).  You may pass blood clots in your  urine, especially if you had a biopsy. It is not unusual to pass small blood clots and have some bloody urine a couple of weeks after your cystoscopy. Again, call your doctor if the bleeding does not subside. You may have: Dysuria (painful urination) Frequency (urinating often) Urgency (strong desire to urinate)  These symptoms are common especially if medicine is instilled into the bladder or a ureteral stent is placed. Avoiding alcohol and caffeine, such as coffee, tea, and chocolate, may help relieve these symptoms. Drink plenty of water, unless otherwise instructed. Your doctor may also prescribe an antibiotic or other medicine to reduce these symptoms.  Cystoscopy results are available soon after the procedure; biopsy results usually take two to four days. Your doctor will discuss the results of your exam with you. Before you go home, you will be given specific instructions for follow-up care. Special Instructions:  1 If you are going home with a catheter in place do not take a tub bath until removed by your doctor.  2 You may resume your normal activities.  3 Do not drive or operate machinery if you are taking narcotic pain medicine.  4 Be sure to keep all follow-up appointments with your doctor.   5 Call Your Doctor If: The catheter is not draining  You have severe pain  You are unable to urinate  You have a fever over 101  You have severe bleeding

## 2018-12-30 NOTE — Anesthesia Procedure Notes (Signed)
Procedure Name: LMA Insertion Date/Time: 12/30/2018 7:35 AM Performed by: Janeece Riggers, MD Pre-anesthesia Checklist: Patient identified, Emergency Drugs available, Suction available and Patient being monitored Patient Re-evaluated:Patient Re-evaluated prior to induction Oxygen Delivery Method: Circle system utilized Preoxygenation: Pre-oxygenation with 100% oxygen Induction Type: IV induction Ventilation: Mask ventilation without difficulty LMA: LMA inserted LMA Size: 4.0 Number of attempts: 1 Airway Equipment and Method: Bite block Placement Confirmation: positive ETCO2 Tube secured with: Tape Dental Injury: Teeth and Oropharynx as per pre-operative assessment

## 2018-12-31 ENCOUNTER — Encounter (HOSPITAL_BASED_OUTPATIENT_CLINIC_OR_DEPARTMENT_OTHER): Payer: Self-pay | Admitting: Urology

## 2019-01-09 DIAGNOSIS — R739 Hyperglycemia, unspecified: Secondary | ICD-10-CM | POA: Diagnosis not present

## 2019-01-09 DIAGNOSIS — E785 Hyperlipidemia, unspecified: Secondary | ICD-10-CM | POA: Diagnosis not present

## 2019-01-09 DIAGNOSIS — D649 Anemia, unspecified: Secondary | ICD-10-CM | POA: Diagnosis not present

## 2019-01-09 DIAGNOSIS — C8338 Diffuse large B-cell lymphoma, lymph nodes of multiple sites: Secondary | ICD-10-CM | POA: Diagnosis not present

## 2019-01-09 DIAGNOSIS — C8330 Diffuse large B-cell lymphoma, unspecified site: Secondary | ICD-10-CM | POA: Diagnosis not present

## 2019-01-23 DIAGNOSIS — N1330 Unspecified hydronephrosis: Secondary | ICD-10-CM | POA: Diagnosis not present

## 2019-02-17 DIAGNOSIS — M94212 Chondromalacia, left shoulder: Secondary | ICD-10-CM | POA: Diagnosis not present

## 2019-02-17 DIAGNOSIS — M659 Synovitis and tenosynovitis, unspecified: Secondary | ICD-10-CM | POA: Diagnosis not present

## 2019-02-17 DIAGNOSIS — M7542 Impingement syndrome of left shoulder: Secondary | ICD-10-CM | POA: Diagnosis not present

## 2019-02-17 DIAGNOSIS — S46012A Strain of muscle(s) and tendon(s) of the rotator cuff of left shoulder, initial encounter: Secondary | ICD-10-CM | POA: Diagnosis not present

## 2019-02-17 DIAGNOSIS — X58XXXA Exposure to other specified factors, initial encounter: Secondary | ICD-10-CM | POA: Diagnosis not present

## 2019-02-17 DIAGNOSIS — G8918 Other acute postprocedural pain: Secondary | ICD-10-CM | POA: Diagnosis not present

## 2019-02-17 DIAGNOSIS — S43432A Superior glenoid labrum lesion of left shoulder, initial encounter: Secondary | ICD-10-CM | POA: Diagnosis not present

## 2019-02-17 DIAGNOSIS — X19XXXA Contact with other heat and hot substances, initial encounter: Secondary | ICD-10-CM | POA: Diagnosis not present

## 2019-02-20 DIAGNOSIS — M62522 Muscle wasting and atrophy, not elsewhere classified, left upper arm: Secondary | ICD-10-CM | POA: Diagnosis not present

## 2019-02-20 DIAGNOSIS — M4003 Postural kyphosis, cervicothoracic region: Secondary | ICD-10-CM | POA: Diagnosis not present

## 2019-02-20 DIAGNOSIS — M25512 Pain in left shoulder: Secondary | ICD-10-CM | POA: Diagnosis not present

## 2019-02-24 DIAGNOSIS — M4003 Postural kyphosis, cervicothoracic region: Secondary | ICD-10-CM | POA: Diagnosis not present

## 2019-02-24 DIAGNOSIS — M25512 Pain in left shoulder: Secondary | ICD-10-CM | POA: Diagnosis not present

## 2019-02-24 DIAGNOSIS — M62522 Muscle wasting and atrophy, not elsewhere classified, left upper arm: Secondary | ICD-10-CM | POA: Diagnosis not present

## 2019-02-27 DIAGNOSIS — M62522 Muscle wasting and atrophy, not elsewhere classified, left upper arm: Secondary | ICD-10-CM | POA: Diagnosis not present

## 2019-02-27 DIAGNOSIS — M25512 Pain in left shoulder: Secondary | ICD-10-CM | POA: Diagnosis not present

## 2019-02-27 DIAGNOSIS — M4003 Postural kyphosis, cervicothoracic region: Secondary | ICD-10-CM | POA: Diagnosis not present

## 2019-03-05 DIAGNOSIS — M25512 Pain in left shoulder: Secondary | ICD-10-CM | POA: Diagnosis not present

## 2019-03-05 DIAGNOSIS — M62522 Muscle wasting and atrophy, not elsewhere classified, left upper arm: Secondary | ICD-10-CM | POA: Diagnosis not present

## 2019-03-05 DIAGNOSIS — M4003 Postural kyphosis, cervicothoracic region: Secondary | ICD-10-CM | POA: Diagnosis not present

## 2019-03-07 DIAGNOSIS — M25512 Pain in left shoulder: Secondary | ICD-10-CM | POA: Diagnosis not present

## 2019-03-07 DIAGNOSIS — M62522 Muscle wasting and atrophy, not elsewhere classified, left upper arm: Secondary | ICD-10-CM | POA: Diagnosis not present

## 2019-03-07 DIAGNOSIS — M4003 Postural kyphosis, cervicothoracic region: Secondary | ICD-10-CM | POA: Diagnosis not present

## 2019-03-12 DIAGNOSIS — M4003 Postural kyphosis, cervicothoracic region: Secondary | ICD-10-CM | POA: Diagnosis not present

## 2019-03-12 DIAGNOSIS — M25512 Pain in left shoulder: Secondary | ICD-10-CM | POA: Diagnosis not present

## 2019-03-12 DIAGNOSIS — M62522 Muscle wasting and atrophy, not elsewhere classified, left upper arm: Secondary | ICD-10-CM | POA: Diagnosis not present

## 2019-03-19 DIAGNOSIS — M25512 Pain in left shoulder: Secondary | ICD-10-CM | POA: Diagnosis not present

## 2019-03-19 DIAGNOSIS — M62522 Muscle wasting and atrophy, not elsewhere classified, left upper arm: Secondary | ICD-10-CM | POA: Diagnosis not present

## 2019-03-19 DIAGNOSIS — M4003 Postural kyphosis, cervicothoracic region: Secondary | ICD-10-CM | POA: Diagnosis not present

## 2019-03-25 DIAGNOSIS — M62522 Muscle wasting and atrophy, not elsewhere classified, left upper arm: Secondary | ICD-10-CM | POA: Diagnosis not present

## 2019-03-25 DIAGNOSIS — M4003 Postural kyphosis, cervicothoracic region: Secondary | ICD-10-CM | POA: Diagnosis not present

## 2019-03-25 DIAGNOSIS — M25512 Pain in left shoulder: Secondary | ICD-10-CM | POA: Diagnosis not present

## 2019-04-02 DIAGNOSIS — M62522 Muscle wasting and atrophy, not elsewhere classified, left upper arm: Secondary | ICD-10-CM | POA: Diagnosis not present

## 2019-04-02 DIAGNOSIS — M4003 Postural kyphosis, cervicothoracic region: Secondary | ICD-10-CM | POA: Diagnosis not present

## 2019-04-02 DIAGNOSIS — M25512 Pain in left shoulder: Secondary | ICD-10-CM | POA: Diagnosis not present

## 2019-04-04 DIAGNOSIS — M4003 Postural kyphosis, cervicothoracic region: Secondary | ICD-10-CM | POA: Diagnosis not present

## 2019-04-04 DIAGNOSIS — M62522 Muscle wasting and atrophy, not elsewhere classified, left upper arm: Secondary | ICD-10-CM | POA: Diagnosis not present

## 2019-04-04 DIAGNOSIS — M25512 Pain in left shoulder: Secondary | ICD-10-CM | POA: Diagnosis not present

## 2019-04-08 DIAGNOSIS — M62522 Muscle wasting and atrophy, not elsewhere classified, left upper arm: Secondary | ICD-10-CM | POA: Diagnosis not present

## 2019-04-08 DIAGNOSIS — M4003 Postural kyphosis, cervicothoracic region: Secondary | ICD-10-CM | POA: Diagnosis not present

## 2019-04-08 DIAGNOSIS — M25512 Pain in left shoulder: Secondary | ICD-10-CM | POA: Diagnosis not present

## 2019-04-10 DIAGNOSIS — M62522 Muscle wasting and atrophy, not elsewhere classified, left upper arm: Secondary | ICD-10-CM | POA: Diagnosis not present

## 2019-04-10 DIAGNOSIS — M25512 Pain in left shoulder: Secondary | ICD-10-CM | POA: Diagnosis not present

## 2019-04-10 DIAGNOSIS — M4003 Postural kyphosis, cervicothoracic region: Secondary | ICD-10-CM | POA: Diagnosis not present

## 2019-04-14 DIAGNOSIS — M62522 Muscle wasting and atrophy, not elsewhere classified, left upper arm: Secondary | ICD-10-CM | POA: Diagnosis not present

## 2019-04-14 DIAGNOSIS — M25512 Pain in left shoulder: Secondary | ICD-10-CM | POA: Diagnosis not present

## 2019-04-14 DIAGNOSIS — M4003 Postural kyphosis, cervicothoracic region: Secondary | ICD-10-CM | POA: Diagnosis not present

## 2019-04-17 DIAGNOSIS — M4003 Postural kyphosis, cervicothoracic region: Secondary | ICD-10-CM | POA: Diagnosis not present

## 2019-04-17 DIAGNOSIS — M25512 Pain in left shoulder: Secondary | ICD-10-CM | POA: Diagnosis not present

## 2019-04-17 DIAGNOSIS — M62522 Muscle wasting and atrophy, not elsewhere classified, left upper arm: Secondary | ICD-10-CM | POA: Diagnosis not present

## 2019-04-21 DIAGNOSIS — Z0000 Encounter for general adult medical examination without abnormal findings: Secondary | ICD-10-CM | POA: Diagnosis not present

## 2019-04-21 DIAGNOSIS — Z6832 Body mass index (BMI) 32.0-32.9, adult: Secondary | ICD-10-CM | POA: Diagnosis not present

## 2019-04-21 DIAGNOSIS — Z1339 Encounter for screening examination for other mental health and behavioral disorders: Secondary | ICD-10-CM | POA: Diagnosis not present

## 2019-04-21 DIAGNOSIS — R252 Cramp and spasm: Secondary | ICD-10-CM | POA: Diagnosis not present

## 2019-04-21 DIAGNOSIS — N390 Urinary tract infection, site not specified: Secondary | ICD-10-CM | POA: Diagnosis not present

## 2019-04-21 DIAGNOSIS — Z7189 Other specified counseling: Secondary | ICD-10-CM | POA: Diagnosis not present

## 2019-04-21 DIAGNOSIS — Z1331 Encounter for screening for depression: Secondary | ICD-10-CM | POA: Diagnosis not present

## 2019-04-22 DIAGNOSIS — M4003 Postural kyphosis, cervicothoracic region: Secondary | ICD-10-CM | POA: Diagnosis not present

## 2019-04-22 DIAGNOSIS — M62522 Muscle wasting and atrophy, not elsewhere classified, left upper arm: Secondary | ICD-10-CM | POA: Diagnosis not present

## 2019-04-22 DIAGNOSIS — M25512 Pain in left shoulder: Secondary | ICD-10-CM | POA: Diagnosis not present

## 2019-04-24 DIAGNOSIS — N1330 Unspecified hydronephrosis: Secondary | ICD-10-CM | POA: Diagnosis not present

## 2019-04-24 DIAGNOSIS — N131 Hydronephrosis with ureteral stricture, not elsewhere classified: Secondary | ICD-10-CM | POA: Diagnosis not present

## 2019-04-29 DIAGNOSIS — M4003 Postural kyphosis, cervicothoracic region: Secondary | ICD-10-CM | POA: Diagnosis not present

## 2019-04-29 DIAGNOSIS — M25512 Pain in left shoulder: Secondary | ICD-10-CM | POA: Diagnosis not present

## 2019-04-29 DIAGNOSIS — M62522 Muscle wasting and atrophy, not elsewhere classified, left upper arm: Secondary | ICD-10-CM | POA: Diagnosis not present

## 2019-05-01 DIAGNOSIS — M4003 Postural kyphosis, cervicothoracic region: Secondary | ICD-10-CM | POA: Diagnosis not present

## 2019-05-01 DIAGNOSIS — M62522 Muscle wasting and atrophy, not elsewhere classified, left upper arm: Secondary | ICD-10-CM | POA: Diagnosis not present

## 2019-05-01 DIAGNOSIS — M25512 Pain in left shoulder: Secondary | ICD-10-CM | POA: Diagnosis not present

## 2019-05-06 DIAGNOSIS — M62522 Muscle wasting and atrophy, not elsewhere classified, left upper arm: Secondary | ICD-10-CM | POA: Diagnosis not present

## 2019-05-06 DIAGNOSIS — M25512 Pain in left shoulder: Secondary | ICD-10-CM | POA: Diagnosis not present

## 2019-05-06 DIAGNOSIS — M4003 Postural kyphosis, cervicothoracic region: Secondary | ICD-10-CM | POA: Diagnosis not present

## 2019-05-08 DIAGNOSIS — M4003 Postural kyphosis, cervicothoracic region: Secondary | ICD-10-CM | POA: Diagnosis not present

## 2019-05-08 DIAGNOSIS — M62522 Muscle wasting and atrophy, not elsewhere classified, left upper arm: Secondary | ICD-10-CM | POA: Diagnosis not present

## 2019-05-08 DIAGNOSIS — M25512 Pain in left shoulder: Secondary | ICD-10-CM | POA: Diagnosis not present

## 2019-05-09 DIAGNOSIS — I700 Atherosclerosis of aorta: Secondary | ICD-10-CM | POA: Diagnosis not present

## 2019-05-09 DIAGNOSIS — N1330 Unspecified hydronephrosis: Secondary | ICD-10-CM | POA: Diagnosis not present

## 2019-05-09 DIAGNOSIS — D259 Leiomyoma of uterus, unspecified: Secondary | ICD-10-CM | POA: Diagnosis not present

## 2019-05-09 DIAGNOSIS — C8330 Diffuse large B-cell lymphoma, unspecified site: Secondary | ICD-10-CM | POA: Diagnosis not present

## 2019-05-13 DIAGNOSIS — M25512 Pain in left shoulder: Secondary | ICD-10-CM | POA: Diagnosis not present

## 2019-05-13 DIAGNOSIS — M4003 Postural kyphosis, cervicothoracic region: Secondary | ICD-10-CM | POA: Diagnosis not present

## 2019-05-13 DIAGNOSIS — M62522 Muscle wasting and atrophy, not elsewhere classified, left upper arm: Secondary | ICD-10-CM | POA: Diagnosis not present

## 2019-05-14 ENCOUNTER — Other Ambulatory Visit (HOSPITAL_COMMUNITY): Payer: Self-pay | Admitting: Oncology

## 2019-05-14 DIAGNOSIS — C8338 Diffuse large B-cell lymphoma, lymph nodes of multiple sites: Secondary | ICD-10-CM | POA: Diagnosis not present

## 2019-05-14 DIAGNOSIS — Z79899 Other long term (current) drug therapy: Secondary | ICD-10-CM | POA: Diagnosis not present

## 2019-05-14 DIAGNOSIS — C8330 Diffuse large B-cell lymphoma, unspecified site: Secondary | ICD-10-CM | POA: Diagnosis not present

## 2019-05-16 DIAGNOSIS — M4003 Postural kyphosis, cervicothoracic region: Secondary | ICD-10-CM | POA: Diagnosis not present

## 2019-05-16 DIAGNOSIS — M25512 Pain in left shoulder: Secondary | ICD-10-CM | POA: Diagnosis not present

## 2019-05-16 DIAGNOSIS — M62522 Muscle wasting and atrophy, not elsewhere classified, left upper arm: Secondary | ICD-10-CM | POA: Diagnosis not present

## 2019-05-20 DIAGNOSIS — Z8579 Personal history of other malignant neoplasms of lymphoid, hematopoietic and related tissues: Secondary | ICD-10-CM | POA: Diagnosis not present

## 2019-05-21 DIAGNOSIS — C8333 Diffuse large B-cell lymphoma, intra-abdominal lymph nodes: Secondary | ICD-10-CM | POA: Diagnosis not present

## 2019-05-22 ENCOUNTER — Ambulatory Visit (HOSPITAL_COMMUNITY)
Admission: RE | Admit: 2019-05-22 | Discharge: 2019-05-22 | Disposition: A | Discharge status: Home / Self Care | Payer: Medicare Other | Source: Ambulatory Visit | Attending: Oncology | Admitting: Oncology

## 2019-05-22 ENCOUNTER — Other Ambulatory Visit: Payer: Self-pay

## 2019-05-22 DIAGNOSIS — N1330 Unspecified hydronephrosis: Secondary | ICD-10-CM | POA: Diagnosis not present

## 2019-05-22 DIAGNOSIS — J439 Emphysema, unspecified: Secondary | ICD-10-CM | POA: Diagnosis not present

## 2019-05-22 DIAGNOSIS — I700 Atherosclerosis of aorta: Secondary | ICD-10-CM | POA: Insufficient documentation

## 2019-05-22 DIAGNOSIS — C8330 Diffuse large B-cell lymphoma, unspecified site: Secondary | ICD-10-CM | POA: Insufficient documentation

## 2019-05-22 DIAGNOSIS — M62522 Muscle wasting and atrophy, not elsewhere classified, left upper arm: Secondary | ICD-10-CM | POA: Diagnosis not present

## 2019-05-22 DIAGNOSIS — M4003 Postural kyphosis, cervicothoracic region: Secondary | ICD-10-CM | POA: Diagnosis not present

## 2019-05-22 DIAGNOSIS — M25512 Pain in left shoulder: Secondary | ICD-10-CM | POA: Diagnosis not present

## 2019-05-22 LAB — GLUCOSE, CAPILLARY: Glucose-Capillary: 109 mg/dL — ABNORMAL HIGH (ref 70–99)

## 2019-05-22 MED ORDER — FLUDEOXYGLUCOSE F - 18 (FDG) INJECTION
7.9000 | Freq: Once | INTRAVENOUS | Status: AC | PRN
  Administered 2019-05-22: 7.9 via INTRAVENOUS

## 2019-05-23 DIAGNOSIS — M25512 Pain in left shoulder: Secondary | ICD-10-CM | POA: Diagnosis not present

## 2019-05-23 DIAGNOSIS — M62522 Muscle wasting and atrophy, not elsewhere classified, left upper arm: Secondary | ICD-10-CM | POA: Diagnosis not present

## 2019-05-23 DIAGNOSIS — M4003 Postural kyphosis, cervicothoracic region: Secondary | ICD-10-CM | POA: Diagnosis not present

## 2019-05-26 DIAGNOSIS — M25512 Pain in left shoulder: Secondary | ICD-10-CM | POA: Diagnosis not present

## 2019-05-26 DIAGNOSIS — C8338 Diffuse large B-cell lymphoma, lymph nodes of multiple sites: Secondary | ICD-10-CM

## 2019-05-26 DIAGNOSIS — M62522 Muscle wasting and atrophy, not elsewhere classified, left upper arm: Secondary | ICD-10-CM | POA: Diagnosis not present

## 2019-05-26 DIAGNOSIS — M4003 Postural kyphosis, cervicothoracic region: Secondary | ICD-10-CM | POA: Diagnosis not present

## 2019-05-26 DIAGNOSIS — C8330 Diffuse large B-cell lymphoma, unspecified site: Secondary | ICD-10-CM | POA: Diagnosis not present

## 2019-06-03 DIAGNOSIS — M4003 Postural kyphosis, cervicothoracic region: Secondary | ICD-10-CM | POA: Diagnosis not present

## 2019-06-03 DIAGNOSIS — M25512 Pain in left shoulder: Secondary | ICD-10-CM | POA: Diagnosis not present

## 2019-06-03 DIAGNOSIS — M62522 Muscle wasting and atrophy, not elsewhere classified, left upper arm: Secondary | ICD-10-CM | POA: Diagnosis not present

## 2019-06-03 DIAGNOSIS — N1330 Unspecified hydronephrosis: Secondary | ICD-10-CM | POA: Diagnosis not present

## 2019-06-05 DIAGNOSIS — M62522 Muscle wasting and atrophy, not elsewhere classified, left upper arm: Secondary | ICD-10-CM | POA: Diagnosis not present

## 2019-06-05 DIAGNOSIS — M4003 Postural kyphosis, cervicothoracic region: Secondary | ICD-10-CM | POA: Diagnosis not present

## 2019-06-05 DIAGNOSIS — M25512 Pain in left shoulder: Secondary | ICD-10-CM | POA: Diagnosis not present

## 2019-06-09 DIAGNOSIS — M4003 Postural kyphosis, cervicothoracic region: Secondary | ICD-10-CM | POA: Diagnosis not present

## 2019-06-09 DIAGNOSIS — M62522 Muscle wasting and atrophy, not elsewhere classified, left upper arm: Secondary | ICD-10-CM | POA: Diagnosis not present

## 2019-06-09 DIAGNOSIS — M25512 Pain in left shoulder: Secondary | ICD-10-CM | POA: Diagnosis not present

## 2019-06-10 DIAGNOSIS — Z5111 Encounter for antineoplastic chemotherapy: Secondary | ICD-10-CM | POA: Diagnosis not present

## 2019-06-10 DIAGNOSIS — C8330 Diffuse large B-cell lymphoma, unspecified site: Secondary | ICD-10-CM | POA: Diagnosis not present

## 2019-06-11 DIAGNOSIS — N1330 Unspecified hydronephrosis: Secondary | ICD-10-CM | POA: Diagnosis not present

## 2019-06-11 DIAGNOSIS — Z5111 Encounter for antineoplastic chemotherapy: Secondary | ICD-10-CM | POA: Diagnosis not present

## 2019-06-11 DIAGNOSIS — C8330 Diffuse large B-cell lymphoma, unspecified site: Secondary | ICD-10-CM | POA: Diagnosis not present

## 2019-06-11 DIAGNOSIS — M069 Rheumatoid arthritis, unspecified: Secondary | ICD-10-CM | POA: Diagnosis not present

## 2019-06-11 DIAGNOSIS — D61818 Other pancytopenia: Secondary | ICD-10-CM | POA: Diagnosis not present

## 2019-06-12 DIAGNOSIS — C8596 Non-Hodgkin lymphoma, unspecified, intrapelvic lymph nodes: Secondary | ICD-10-CM | POA: Diagnosis not present

## 2019-06-13 DIAGNOSIS — R300 Dysuria: Secondary | ICD-10-CM | POA: Diagnosis not present

## 2019-06-13 DIAGNOSIS — C8330 Diffuse large B-cell lymphoma, unspecified site: Secondary | ICD-10-CM | POA: Diagnosis not present

## 2019-06-19 DIAGNOSIS — C8330 Diffuse large B-cell lymphoma, unspecified site: Secondary | ICD-10-CM

## 2019-06-19 DIAGNOSIS — Z936 Other artificial openings of urinary tract status: Secondary | ICD-10-CM | POA: Diagnosis not present

## 2019-06-19 DIAGNOSIS — M069 Rheumatoid arthritis, unspecified: Secondary | ICD-10-CM | POA: Diagnosis not present

## 2019-06-23 DIAGNOSIS — C8330 Diffuse large B-cell lymphoma, unspecified site: Secondary | ICD-10-CM | POA: Diagnosis not present

## 2019-06-24 DIAGNOSIS — C8330 Diffuse large B-cell lymphoma, unspecified site: Secondary | ICD-10-CM | POA: Diagnosis not present

## 2019-06-26 DIAGNOSIS — C8330 Diffuse large B-cell lymphoma, unspecified site: Secondary | ICD-10-CM | POA: Diagnosis not present

## 2019-07-01 ENCOUNTER — Other Ambulatory Visit: Payer: Self-pay | Admitting: Oncology

## 2019-07-01 ENCOUNTER — Other Ambulatory Visit (HOSPITAL_COMMUNITY): Payer: Self-pay | Admitting: Oncology

## 2019-07-01 DIAGNOSIS — R300 Dysuria: Secondary | ICD-10-CM | POA: Diagnosis not present

## 2019-07-01 DIAGNOSIS — C8330 Diffuse large B-cell lymphoma, unspecified site: Secondary | ICD-10-CM | POA: Diagnosis not present

## 2019-07-02 DIAGNOSIS — Z5111 Encounter for antineoplastic chemotherapy: Secondary | ICD-10-CM | POA: Diagnosis not present

## 2019-07-02 DIAGNOSIS — C8330 Diffuse large B-cell lymphoma, unspecified site: Secondary | ICD-10-CM | POA: Diagnosis not present

## 2019-07-03 DIAGNOSIS — Z5111 Encounter for antineoplastic chemotherapy: Secondary | ICD-10-CM | POA: Diagnosis not present

## 2019-07-03 DIAGNOSIS — C8330 Diffuse large B-cell lymphoma, unspecified site: Secondary | ICD-10-CM | POA: Diagnosis not present

## 2019-07-07 DIAGNOSIS — C8330 Diffuse large B-cell lymphoma, unspecified site: Secondary | ICD-10-CM | POA: Diagnosis not present

## 2019-07-10 DIAGNOSIS — C8330 Diffuse large B-cell lymphoma, unspecified site: Secondary | ICD-10-CM | POA: Diagnosis not present

## 2019-07-14 DIAGNOSIS — C8333 Diffuse large B-cell lymphoma, intra-abdominal lymph nodes: Secondary | ICD-10-CM | POA: Diagnosis not present

## 2019-07-14 DIAGNOSIS — Z01812 Encounter for preprocedural laboratory examination: Secondary | ICD-10-CM | POA: Diagnosis not present

## 2019-07-14 DIAGNOSIS — Z01818 Encounter for other preprocedural examination: Secondary | ICD-10-CM | POA: Diagnosis not present

## 2019-07-14 DIAGNOSIS — Z1159 Encounter for screening for other viral diseases: Secondary | ICD-10-CM | POA: Diagnosis not present

## 2019-07-15 DIAGNOSIS — C8330 Diffuse large B-cell lymphoma, unspecified site: Secondary | ICD-10-CM

## 2019-07-16 DIAGNOSIS — C8330 Diffuse large B-cell lymphoma, unspecified site: Secondary | ICD-10-CM | POA: Diagnosis not present

## 2019-07-17 ENCOUNTER — Ambulatory Visit (HOSPITAL_COMMUNITY)
Admission: RE | Admit: 2019-07-17 | Discharge: 2019-07-17 | Disposition: A | Discharge status: Home / Self Care | Payer: Medicare Other | Source: Ambulatory Visit | Attending: Oncology | Admitting: Oncology

## 2019-07-17 ENCOUNTER — Other Ambulatory Visit: Payer: Self-pay

## 2019-07-17 DIAGNOSIS — C8330 Diffuse large B-cell lymphoma, unspecified site: Secondary | ICD-10-CM | POA: Diagnosis not present

## 2019-07-17 DIAGNOSIS — I700 Atherosclerosis of aorta: Secondary | ICD-10-CM | POA: Diagnosis not present

## 2019-07-17 DIAGNOSIS — I898 Other specified noninfective disorders of lymphatic vessels and lymph nodes: Secondary | ICD-10-CM | POA: Insufficient documentation

## 2019-07-17 DIAGNOSIS — C8590 Non-Hodgkin lymphoma, unspecified, unspecified site: Secondary | ICD-10-CM | POA: Diagnosis not present

## 2019-07-17 LAB — GLUCOSE, CAPILLARY: Glucose-Capillary: 109 mg/dL — ABNORMAL HIGH (ref 70–99)

## 2019-07-17 MED ORDER — FLUDEOXYGLUCOSE F - 18 (FDG) INJECTION
8.0900 | Freq: Once | INTRAVENOUS | Status: AC | PRN
  Administered 2019-07-17: 11:00:00 8.09 via INTRAVENOUS

## 2019-07-18 ENCOUNTER — Other Ambulatory Visit: Payer: Self-pay

## 2019-07-21 DIAGNOSIS — C8333 Diffuse large B-cell lymphoma, intra-abdominal lymph nodes: Secondary | ICD-10-CM | POA: Diagnosis not present

## 2019-07-21 DIAGNOSIS — I501 Left ventricular failure: Secondary | ICD-10-CM | POA: Diagnosis not present

## 2019-07-21 DIAGNOSIS — I071 Rheumatic tricuspid insufficiency: Secondary | ICD-10-CM | POA: Diagnosis not present

## 2019-07-21 DIAGNOSIS — C8393 Non-follicular (diffuse) lymphoma, unspecified, intra-abdominal lymph nodes: Secondary | ICD-10-CM | POA: Diagnosis not present

## 2019-07-21 DIAGNOSIS — Z01810 Encounter for preprocedural cardiovascular examination: Secondary | ICD-10-CM | POA: Diagnosis not present

## 2019-07-21 DIAGNOSIS — Z52011 Autologous donor, stem cells: Secondary | ICD-10-CM | POA: Diagnosis not present

## 2019-07-21 DIAGNOSIS — Z01818 Encounter for other preprocedural examination: Secondary | ICD-10-CM | POA: Diagnosis not present

## 2019-07-21 DIAGNOSIS — R0600 Dyspnea, unspecified: Secondary | ICD-10-CM | POA: Diagnosis not present

## 2019-07-21 DIAGNOSIS — R530 Neoplastic (malignant) related fatigue: Secondary | ICD-10-CM | POA: Diagnosis not present

## 2019-07-21 DIAGNOSIS — I422 Other hypertrophic cardiomyopathy: Secondary | ICD-10-CM | POA: Diagnosis not present

## 2019-07-21 DIAGNOSIS — Z7682 Awaiting organ transplant status: Secondary | ICD-10-CM | POA: Diagnosis not present

## 2019-07-22 ENCOUNTER — Other Ambulatory Visit (HOSPITAL_COMMUNITY)
Admission: RE | Admit: 2019-07-22 | Disposition: A | Discharge status: Home / Self Care | Payer: Medicare Other | Source: Ambulatory Visit | Attending: Oncology | Admitting: Oncology

## 2019-07-22 DIAGNOSIS — Z5111 Encounter for antineoplastic chemotherapy: Secondary | ICD-10-CM | POA: Diagnosis not present

## 2019-07-22 DIAGNOSIS — D7589 Other specified diseases of blood and blood-forming organs: Secondary | ICD-10-CM | POA: Diagnosis not present

## 2019-07-22 DIAGNOSIS — C8330 Diffuse large B-cell lymphoma, unspecified site: Secondary | ICD-10-CM | POA: Diagnosis not present

## 2019-07-22 DIAGNOSIS — D759 Disease of blood and blood-forming organs, unspecified: Secondary | ICD-10-CM | POA: Diagnosis not present

## 2019-07-22 DIAGNOSIS — D649 Anemia, unspecified: Secondary | ICD-10-CM | POA: Diagnosis not present

## 2019-07-24 DIAGNOSIS — M25512 Pain in left shoulder: Secondary | ICD-10-CM | POA: Diagnosis not present

## 2019-07-28 DIAGNOSIS — C8330 Diffuse large B-cell lymphoma, unspecified site: Secondary | ICD-10-CM | POA: Diagnosis not present

## 2019-07-28 DIAGNOSIS — Z515 Encounter for palliative care: Secondary | ICD-10-CM | POA: Diagnosis not present

## 2019-07-29 ENCOUNTER — Encounter (HOSPITAL_COMMUNITY): Payer: Self-pay | Admitting: Oncology

## 2019-07-30 DIAGNOSIS — Z006 Encounter for examination for normal comparison and control in clinical research program: Secondary | ICD-10-CM | POA: Diagnosis not present

## 2019-07-30 DIAGNOSIS — C8333 Diffuse large B-cell lymphoma, intra-abdominal lymph nodes: Secondary | ICD-10-CM | POA: Diagnosis not present

## 2019-07-30 DIAGNOSIS — Z01818 Encounter for other preprocedural examination: Secondary | ICD-10-CM | POA: Diagnosis not present

## 2019-07-30 DIAGNOSIS — Z7682 Awaiting organ transplant status: Secondary | ICD-10-CM | POA: Diagnosis not present

## 2019-08-02 DIAGNOSIS — Z52011 Autologous donor, stem cells: Secondary | ICD-10-CM | POA: Diagnosis not present

## 2019-08-02 DIAGNOSIS — C8333 Diffuse large B-cell lymphoma, intra-abdominal lymph nodes: Secondary | ICD-10-CM | POA: Diagnosis not present

## 2019-08-03 DIAGNOSIS — C8333 Diffuse large B-cell lymphoma, intra-abdominal lymph nodes: Secondary | ICD-10-CM | POA: Diagnosis not present

## 2019-08-03 DIAGNOSIS — Z9484 Stem cells transplant status: Secondary | ICD-10-CM | POA: Diagnosis not present

## 2019-08-04 DIAGNOSIS — Z5189 Encounter for other specified aftercare: Secondary | ICD-10-CM | POA: Diagnosis not present

## 2019-08-04 DIAGNOSIS — C8330 Diffuse large B-cell lymphoma, unspecified site: Secondary | ICD-10-CM | POA: Diagnosis not present

## 2019-08-05 DIAGNOSIS — Z52011 Autologous donor, stem cells: Secondary | ICD-10-CM | POA: Diagnosis not present

## 2019-08-05 DIAGNOSIS — Z7682 Awaiting organ transplant status: Secondary | ICD-10-CM | POA: Diagnosis not present

## 2019-08-05 DIAGNOSIS — C8333 Diffuse large B-cell lymphoma, intra-abdominal lymph nodes: Secondary | ICD-10-CM | POA: Diagnosis not present

## 2019-08-06 DIAGNOSIS — D72829 Elevated white blood cell count, unspecified: Secondary | ICD-10-CM | POA: Diagnosis not present

## 2019-08-06 DIAGNOSIS — C8330 Diffuse large B-cell lymphoma, unspecified site: Secondary | ICD-10-CM | POA: Diagnosis not present

## 2019-08-06 DIAGNOSIS — C8333 Diffuse large B-cell lymphoma, intra-abdominal lymph nodes: Secondary | ICD-10-CM | POA: Diagnosis not present

## 2019-08-06 DIAGNOSIS — Z52011 Autologous donor, stem cells: Secondary | ICD-10-CM | POA: Diagnosis not present

## 2019-08-07 DIAGNOSIS — C8333 Diffuse large B-cell lymphoma, intra-abdominal lymph nodes: Secondary | ICD-10-CM | POA: Diagnosis not present

## 2019-08-07 DIAGNOSIS — Z52011 Autologous donor, stem cells: Secondary | ICD-10-CM | POA: Diagnosis not present

## 2019-08-07 DIAGNOSIS — Z9481 Bone marrow transplant status: Secondary | ICD-10-CM | POA: Diagnosis not present

## 2019-08-08 DIAGNOSIS — C8330 Diffuse large B-cell lymphoma, unspecified site: Secondary | ICD-10-CM | POA: Diagnosis not present

## 2019-08-12 DIAGNOSIS — C8333 Diffuse large B-cell lymphoma, intra-abdominal lymph nodes: Secondary | ICD-10-CM | POA: Diagnosis not present

## 2019-08-15 DIAGNOSIS — Z52011 Autologous donor, stem cells: Secondary | ICD-10-CM | POA: Diagnosis not present

## 2019-08-15 DIAGNOSIS — I10 Essential (primary) hypertension: Secondary | ICD-10-CM | POA: Diagnosis not present

## 2019-08-15 DIAGNOSIS — E785 Hyperlipidemia, unspecified: Secondary | ICD-10-CM | POA: Diagnosis not present

## 2019-08-15 DIAGNOSIS — C8333 Diffuse large B-cell lymphoma, intra-abdominal lymph nodes: Secondary | ICD-10-CM | POA: Diagnosis not present

## 2019-08-15 DIAGNOSIS — Z006 Encounter for examination for normal comparison and control in clinical research program: Secondary | ICD-10-CM | POA: Diagnosis not present

## 2019-08-15 DIAGNOSIS — T451X5A Adverse effect of antineoplastic and immunosuppressive drugs, initial encounter: Secondary | ICD-10-CM | POA: Diagnosis not present

## 2019-08-15 DIAGNOSIS — Z87448 Personal history of other diseases of urinary system: Secondary | ICD-10-CM | POA: Diagnosis not present

## 2019-08-15 DIAGNOSIS — M069 Rheumatoid arthritis, unspecified: Secondary | ICD-10-CM | POA: Diagnosis not present

## 2019-08-15 DIAGNOSIS — Z452 Encounter for adjustment and management of vascular access device: Secondary | ICD-10-CM | POA: Diagnosis not present

## 2019-08-15 DIAGNOSIS — Z79899 Other long term (current) drug therapy: Secondary | ICD-10-CM | POA: Diagnosis not present

## 2019-08-15 DIAGNOSIS — D61810 Antineoplastic chemotherapy induced pancytopenia: Secondary | ICD-10-CM | POA: Diagnosis not present

## 2019-08-15 DIAGNOSIS — M109 Gout, unspecified: Secondary | ICD-10-CM | POA: Diagnosis not present

## 2019-08-16 DIAGNOSIS — R002 Palpitations: Secondary | ICD-10-CM | POA: Diagnosis not present

## 2019-08-16 DIAGNOSIS — E785 Hyperlipidemia, unspecified: Secondary | ICD-10-CM | POA: Diagnosis not present

## 2019-08-16 DIAGNOSIS — Z79899 Other long term (current) drug therapy: Secondary | ICD-10-CM | POA: Diagnosis not present

## 2019-08-16 DIAGNOSIS — D61810 Antineoplastic chemotherapy induced pancytopenia: Secondary | ICD-10-CM | POA: Diagnosis not present

## 2019-08-16 DIAGNOSIS — T451X5A Adverse effect of antineoplastic and immunosuppressive drugs, initial encounter: Secondary | ICD-10-CM | POA: Diagnosis not present

## 2019-08-16 DIAGNOSIS — Z52011 Autologous donor, stem cells: Secondary | ICD-10-CM | POA: Diagnosis not present

## 2019-08-16 DIAGNOSIS — C8333 Diffuse large B-cell lymphoma, intra-abdominal lymph nodes: Secondary | ICD-10-CM | POA: Diagnosis not present

## 2019-08-16 DIAGNOSIS — M069 Rheumatoid arthritis, unspecified: Secondary | ICD-10-CM | POA: Diagnosis not present

## 2019-08-16 DIAGNOSIS — I10 Essential (primary) hypertension: Secondary | ICD-10-CM | POA: Diagnosis not present

## 2019-08-16 DIAGNOSIS — M109 Gout, unspecified: Secondary | ICD-10-CM | POA: Diagnosis not present

## 2019-08-16 DIAGNOSIS — R61 Generalized hyperhidrosis: Secondary | ICD-10-CM | POA: Diagnosis not present

## 2019-08-16 DIAGNOSIS — Z006 Encounter for examination for normal comparison and control in clinical research program: Secondary | ICD-10-CM | POA: Diagnosis not present

## 2019-08-17 DIAGNOSIS — Z79899 Other long term (current) drug therapy: Secondary | ICD-10-CM | POA: Diagnosis not present

## 2019-08-17 DIAGNOSIS — Z52011 Autologous donor, stem cells: Secondary | ICD-10-CM | POA: Diagnosis not present

## 2019-08-17 DIAGNOSIS — D61810 Antineoplastic chemotherapy induced pancytopenia: Secondary | ICD-10-CM | POA: Diagnosis not present

## 2019-08-17 DIAGNOSIS — R002 Palpitations: Secondary | ICD-10-CM | POA: Diagnosis not present

## 2019-08-17 DIAGNOSIS — T451X5A Adverse effect of antineoplastic and immunosuppressive drugs, initial encounter: Secondary | ICD-10-CM | POA: Diagnosis not present

## 2019-08-17 DIAGNOSIS — M069 Rheumatoid arthritis, unspecified: Secondary | ICD-10-CM | POA: Diagnosis not present

## 2019-08-17 DIAGNOSIS — I10 Essential (primary) hypertension: Secondary | ICD-10-CM | POA: Diagnosis not present

## 2019-08-17 DIAGNOSIS — C8333 Diffuse large B-cell lymphoma, intra-abdominal lymph nodes: Secondary | ICD-10-CM | POA: Diagnosis not present

## 2019-08-17 DIAGNOSIS — E785 Hyperlipidemia, unspecified: Secondary | ICD-10-CM | POA: Diagnosis not present

## 2019-08-17 DIAGNOSIS — M109 Gout, unspecified: Secondary | ICD-10-CM | POA: Diagnosis not present

## 2019-08-17 DIAGNOSIS — Z006 Encounter for examination for normal comparison and control in clinical research program: Secondary | ICD-10-CM | POA: Diagnosis not present

## 2019-08-18 DIAGNOSIS — I10 Essential (primary) hypertension: Secondary | ICD-10-CM | POA: Diagnosis not present

## 2019-08-18 DIAGNOSIS — Z52011 Autologous donor, stem cells: Secondary | ICD-10-CM | POA: Diagnosis not present

## 2019-08-18 DIAGNOSIS — Z006 Encounter for examination for normal comparison and control in clinical research program: Secondary | ICD-10-CM | POA: Diagnosis not present

## 2019-08-18 DIAGNOSIS — M069 Rheumatoid arthritis, unspecified: Secondary | ICD-10-CM | POA: Diagnosis not present

## 2019-08-18 DIAGNOSIS — T451X5A Adverse effect of antineoplastic and immunosuppressive drugs, initial encounter: Secondary | ICD-10-CM | POA: Diagnosis not present

## 2019-08-18 DIAGNOSIS — M109 Gout, unspecified: Secondary | ICD-10-CM | POA: Diagnosis not present

## 2019-08-18 DIAGNOSIS — E785 Hyperlipidemia, unspecified: Secondary | ICD-10-CM | POA: Diagnosis not present

## 2019-08-18 DIAGNOSIS — D61810 Antineoplastic chemotherapy induced pancytopenia: Secondary | ICD-10-CM | POA: Diagnosis not present

## 2019-08-18 DIAGNOSIS — Z79899 Other long term (current) drug therapy: Secondary | ICD-10-CM | POA: Diagnosis not present

## 2019-08-18 DIAGNOSIS — C8333 Diffuse large B-cell lymphoma, intra-abdominal lymph nodes: Secondary | ICD-10-CM | POA: Diagnosis not present

## 2019-08-19 DIAGNOSIS — C8333 Diffuse large B-cell lymphoma, intra-abdominal lymph nodes: Secondary | ICD-10-CM | POA: Diagnosis not present

## 2019-08-19 DIAGNOSIS — T451X5A Adverse effect of antineoplastic and immunosuppressive drugs, initial encounter: Secondary | ICD-10-CM | POA: Diagnosis not present

## 2019-08-19 DIAGNOSIS — Z52011 Autologous donor, stem cells: Secondary | ICD-10-CM | POA: Diagnosis not present

## 2019-08-19 DIAGNOSIS — D61810 Antineoplastic chemotherapy induced pancytopenia: Secondary | ICD-10-CM | POA: Diagnosis not present

## 2019-08-19 DIAGNOSIS — M109 Gout, unspecified: Secondary | ICD-10-CM | POA: Diagnosis not present

## 2019-08-19 DIAGNOSIS — E785 Hyperlipidemia, unspecified: Secondary | ICD-10-CM | POA: Diagnosis not present

## 2019-08-19 DIAGNOSIS — Z79899 Other long term (current) drug therapy: Secondary | ICD-10-CM | POA: Diagnosis not present

## 2019-08-19 DIAGNOSIS — Z006 Encounter for examination for normal comparison and control in clinical research program: Secondary | ICD-10-CM | POA: Diagnosis not present

## 2019-08-19 DIAGNOSIS — M069 Rheumatoid arthritis, unspecified: Secondary | ICD-10-CM | POA: Diagnosis not present

## 2019-08-19 DIAGNOSIS — I10 Essential (primary) hypertension: Secondary | ICD-10-CM | POA: Diagnosis not present

## 2019-08-20 DIAGNOSIS — R0602 Shortness of breath: Secondary | ICD-10-CM | POA: Diagnosis not present

## 2019-08-20 DIAGNOSIS — R002 Palpitations: Secondary | ICD-10-CM | POA: Diagnosis not present

## 2019-08-20 DIAGNOSIS — M069 Rheumatoid arthritis, unspecified: Secondary | ICD-10-CM | POA: Diagnosis not present

## 2019-08-20 DIAGNOSIS — M109 Gout, unspecified: Secondary | ICD-10-CM | POA: Diagnosis not present

## 2019-08-20 DIAGNOSIS — H538 Other visual disturbances: Secondary | ICD-10-CM | POA: Diagnosis not present

## 2019-08-20 DIAGNOSIS — E785 Hyperlipidemia, unspecified: Secondary | ICD-10-CM | POA: Diagnosis not present

## 2019-08-20 DIAGNOSIS — C8333 Diffuse large B-cell lymphoma, intra-abdominal lymph nodes: Secondary | ICD-10-CM | POA: Diagnosis not present

## 2019-08-20 DIAGNOSIS — T451X5A Adverse effect of antineoplastic and immunosuppressive drugs, initial encounter: Secondary | ICD-10-CM | POA: Diagnosis not present

## 2019-08-20 DIAGNOSIS — C8338 Diffuse large B-cell lymphoma, lymph nodes of multiple sites: Secondary | ICD-10-CM | POA: Diagnosis not present

## 2019-08-20 DIAGNOSIS — I10 Essential (primary) hypertension: Secondary | ICD-10-CM | POA: Diagnosis not present

## 2019-08-20 DIAGNOSIS — Z79899 Other long term (current) drug therapy: Secondary | ICD-10-CM | POA: Diagnosis not present

## 2019-08-20 DIAGNOSIS — D61810 Antineoplastic chemotherapy induced pancytopenia: Secondary | ICD-10-CM | POA: Diagnosis not present

## 2019-08-20 DIAGNOSIS — Z006 Encounter for examination for normal comparison and control in clinical research program: Secondary | ICD-10-CM | POA: Diagnosis not present

## 2019-08-21 DIAGNOSIS — D61810 Antineoplastic chemotherapy induced pancytopenia: Secondary | ICD-10-CM | POA: Diagnosis not present

## 2019-08-21 DIAGNOSIS — T451X5A Adverse effect of antineoplastic and immunosuppressive drugs, initial encounter: Secondary | ICD-10-CM | POA: Diagnosis not present

## 2019-08-21 DIAGNOSIS — C8338 Diffuse large B-cell lymphoma, lymph nodes of multiple sites: Secondary | ICD-10-CM | POA: Diagnosis not present

## 2019-08-21 DIAGNOSIS — Z006 Encounter for examination for normal comparison and control in clinical research program: Secondary | ICD-10-CM | POA: Diagnosis not present

## 2019-08-21 DIAGNOSIS — H538 Other visual disturbances: Secondary | ICD-10-CM | POA: Diagnosis not present

## 2019-08-21 DIAGNOSIS — Z79899 Other long term (current) drug therapy: Secondary | ICD-10-CM | POA: Diagnosis not present

## 2019-08-21 DIAGNOSIS — E785 Hyperlipidemia, unspecified: Secondary | ICD-10-CM | POA: Diagnosis not present

## 2019-08-21 DIAGNOSIS — R0602 Shortness of breath: Secondary | ICD-10-CM | POA: Diagnosis not present

## 2019-08-21 DIAGNOSIS — Z9484 Stem cells transplant status: Secondary | ICD-10-CM | POA: Diagnosis not present

## 2019-08-21 DIAGNOSIS — C8333 Diffuse large B-cell lymphoma, intra-abdominal lymph nodes: Secondary | ICD-10-CM | POA: Diagnosis not present

## 2019-08-21 DIAGNOSIS — N1330 Unspecified hydronephrosis: Secondary | ICD-10-CM | POA: Diagnosis not present

## 2019-08-21 DIAGNOSIS — A419 Sepsis, unspecified organism: Secondary | ICD-10-CM | POA: Diagnosis not present

## 2019-08-21 DIAGNOSIS — M109 Gout, unspecified: Secondary | ICD-10-CM | POA: Diagnosis not present

## 2019-08-21 DIAGNOSIS — I10 Essential (primary) hypertension: Secondary | ICD-10-CM | POA: Diagnosis not present

## 2019-08-21 DIAGNOSIS — M069 Rheumatoid arthritis, unspecified: Secondary | ICD-10-CM | POA: Diagnosis not present

## 2019-08-22 DIAGNOSIS — Z9484 Stem cells transplant status: Secondary | ICD-10-CM | POA: Diagnosis not present

## 2019-08-22 DIAGNOSIS — N1330 Unspecified hydronephrosis: Secondary | ICD-10-CM | POA: Diagnosis not present

## 2019-08-22 DIAGNOSIS — C8333 Diffuse large B-cell lymphoma, intra-abdominal lymph nodes: Secondary | ICD-10-CM | POA: Diagnosis not present

## 2019-08-22 DIAGNOSIS — T451X5A Adverse effect of antineoplastic and immunosuppressive drugs, initial encounter: Secondary | ICD-10-CM | POA: Diagnosis not present

## 2019-08-22 DIAGNOSIS — Z006 Encounter for examination for normal comparison and control in clinical research program: Secondary | ICD-10-CM | POA: Diagnosis not present

## 2019-08-22 DIAGNOSIS — M069 Rheumatoid arthritis, unspecified: Secondary | ICD-10-CM | POA: Diagnosis not present

## 2019-08-22 DIAGNOSIS — D61810 Antineoplastic chemotherapy induced pancytopenia: Secondary | ICD-10-CM | POA: Diagnosis not present

## 2019-08-22 DIAGNOSIS — Z79899 Other long term (current) drug therapy: Secondary | ICD-10-CM | POA: Diagnosis not present

## 2019-08-22 DIAGNOSIS — M109 Gout, unspecified: Secondary | ICD-10-CM | POA: Diagnosis not present

## 2019-08-22 DIAGNOSIS — I10 Essential (primary) hypertension: Secondary | ICD-10-CM | POA: Diagnosis not present

## 2019-08-22 DIAGNOSIS — R0602 Shortness of breath: Secondary | ICD-10-CM | POA: Diagnosis not present

## 2019-08-22 DIAGNOSIS — E785 Hyperlipidemia, unspecified: Secondary | ICD-10-CM | POA: Diagnosis not present

## 2019-08-22 DIAGNOSIS — H538 Other visual disturbances: Secondary | ICD-10-CM | POA: Diagnosis not present

## 2019-08-23 DIAGNOSIS — Z006 Encounter for examination for normal comparison and control in clinical research program: Secondary | ICD-10-CM | POA: Diagnosis not present

## 2019-08-23 DIAGNOSIS — M069 Rheumatoid arthritis, unspecified: Secondary | ICD-10-CM | POA: Diagnosis not present

## 2019-08-23 DIAGNOSIS — M109 Gout, unspecified: Secondary | ICD-10-CM | POA: Diagnosis not present

## 2019-08-23 DIAGNOSIS — C8333 Diffuse large B-cell lymphoma, intra-abdominal lymph nodes: Secondary | ICD-10-CM | POA: Diagnosis not present

## 2019-08-24 DIAGNOSIS — K1121 Acute sialoadenitis: Secondary | ICD-10-CM | POA: Diagnosis not present

## 2019-08-24 DIAGNOSIS — D61810 Antineoplastic chemotherapy induced pancytopenia: Secondary | ICD-10-CM | POA: Diagnosis not present

## 2019-08-24 DIAGNOSIS — C8330 Diffuse large B-cell lymphoma, unspecified site: Secondary | ICD-10-CM | POA: Diagnosis not present

## 2019-08-24 DIAGNOSIS — C8333 Diffuse large B-cell lymphoma, intra-abdominal lymph nodes: Secondary | ICD-10-CM | POA: Diagnosis not present

## 2019-08-24 DIAGNOSIS — T451X5A Adverse effect of antineoplastic and immunosuppressive drugs, initial encounter: Secondary | ICD-10-CM | POA: Diagnosis not present

## 2019-08-24 DIAGNOSIS — I10 Essential (primary) hypertension: Secondary | ICD-10-CM | POA: Diagnosis not present

## 2019-08-25 DIAGNOSIS — D61810 Antineoplastic chemotherapy induced pancytopenia: Secondary | ICD-10-CM | POA: Diagnosis not present

## 2019-08-25 DIAGNOSIS — T451X5A Adverse effect of antineoplastic and immunosuppressive drugs, initial encounter: Secondary | ICD-10-CM | POA: Diagnosis not present

## 2019-08-25 DIAGNOSIS — C8330 Diffuse large B-cell lymphoma, unspecified site: Secondary | ICD-10-CM | POA: Diagnosis not present

## 2019-08-25 DIAGNOSIS — C8333 Diffuse large B-cell lymphoma, intra-abdominal lymph nodes: Secondary | ICD-10-CM | POA: Diagnosis not present

## 2019-08-25 DIAGNOSIS — K1121 Acute sialoadenitis: Secondary | ICD-10-CM | POA: Diagnosis not present

## 2019-08-25 DIAGNOSIS — I10 Essential (primary) hypertension: Secondary | ICD-10-CM | POA: Diagnosis not present

## 2019-08-26 DIAGNOSIS — T3695XA Adverse effect of unspecified systemic antibiotic, initial encounter: Secondary | ICD-10-CM | POA: Diagnosis present

## 2019-08-26 DIAGNOSIS — Z79899 Other long term (current) drug therapy: Secondary | ICD-10-CM | POA: Diagnosis not present

## 2019-08-26 DIAGNOSIS — M109 Gout, unspecified: Secondary | ICD-10-CM | POA: Diagnosis present

## 2019-08-26 DIAGNOSIS — Z8249 Family history of ischemic heart disease and other diseases of the circulatory system: Secondary | ICD-10-CM | POA: Diagnosis not present

## 2019-08-26 DIAGNOSIS — T451X5A Adverse effect of antineoplastic and immunosuppressive drugs, initial encounter: Secondary | ICD-10-CM | POA: Diagnosis present

## 2019-08-26 DIAGNOSIS — D61810 Antineoplastic chemotherapy induced pancytopenia: Secondary | ICD-10-CM | POA: Diagnosis present

## 2019-08-26 DIAGNOSIS — Z888 Allergy status to other drugs, medicaments and biological substances status: Secondary | ICD-10-CM | POA: Diagnosis not present

## 2019-08-26 DIAGNOSIS — M069 Rheumatoid arthritis, unspecified: Secondary | ICD-10-CM | POA: Diagnosis present

## 2019-08-26 DIAGNOSIS — B009 Herpesviral infection, unspecified: Secondary | ICD-10-CM | POA: Diagnosis present

## 2019-08-26 DIAGNOSIS — K521 Toxic gastroenteritis and colitis: Secondary | ICD-10-CM | POA: Diagnosis present

## 2019-08-26 DIAGNOSIS — Z881 Allergy status to other antibiotic agents status: Secondary | ICD-10-CM | POA: Diagnosis not present

## 2019-08-26 DIAGNOSIS — Z87891 Personal history of nicotine dependence: Secondary | ICD-10-CM | POA: Diagnosis not present

## 2019-08-26 DIAGNOSIS — Z9484 Stem cells transplant status: Secondary | ICD-10-CM | POA: Diagnosis not present

## 2019-08-26 DIAGNOSIS — K1121 Acute sialoadenitis: Secondary | ICD-10-CM | POA: Diagnosis not present

## 2019-08-26 DIAGNOSIS — I10 Essential (primary) hypertension: Secondary | ICD-10-CM | POA: Diagnosis present

## 2019-08-26 DIAGNOSIS — K1120 Sialoadenitis, unspecified: Secondary | ICD-10-CM | POA: Diagnosis present

## 2019-08-26 DIAGNOSIS — C8330 Diffuse large B-cell lymphoma, unspecified site: Secondary | ICD-10-CM | POA: Diagnosis not present

## 2019-08-26 DIAGNOSIS — E785 Hyperlipidemia, unspecified: Secondary | ICD-10-CM | POA: Diagnosis present

## 2019-08-26 DIAGNOSIS — C8333 Diffuse large B-cell lymphoma, intra-abdominal lymph nodes: Secondary | ICD-10-CM | POA: Diagnosis present

## 2019-08-28 DIAGNOSIS — C8333 Diffuse large B-cell lymphoma, intra-abdominal lymph nodes: Secondary | ICD-10-CM | POA: Diagnosis present

## 2019-08-28 DIAGNOSIS — E876 Hypokalemia: Secondary | ICD-10-CM | POA: Diagnosis present

## 2019-08-28 DIAGNOSIS — T451X5A Adverse effect of antineoplastic and immunosuppressive drugs, initial encounter: Secondary | ICD-10-CM | POA: Diagnosis present

## 2019-08-28 DIAGNOSIS — I10 Essential (primary) hypertension: Secondary | ICD-10-CM | POA: Diagnosis present

## 2019-08-28 DIAGNOSIS — R002 Palpitations: Secondary | ICD-10-CM | POA: Diagnosis not present

## 2019-08-28 DIAGNOSIS — E785 Hyperlipidemia, unspecified: Secondary | ICD-10-CM | POA: Diagnosis present

## 2019-08-28 DIAGNOSIS — M109 Gout, unspecified: Secondary | ICD-10-CM | POA: Diagnosis present

## 2019-08-28 DIAGNOSIS — C8330 Diffuse large B-cell lymphoma, unspecified site: Secondary | ICD-10-CM | POA: Diagnosis not present

## 2019-08-28 DIAGNOSIS — D709 Neutropenia, unspecified: Secondary | ICD-10-CM | POA: Diagnosis present

## 2019-08-28 DIAGNOSIS — Z79899 Other long term (current) drug therapy: Secondary | ICD-10-CM | POA: Diagnosis not present

## 2019-08-28 DIAGNOSIS — Z9484 Stem cells transplant status: Secondary | ICD-10-CM | POA: Diagnosis not present

## 2019-08-28 DIAGNOSIS — Z87891 Personal history of nicotine dependence: Secondary | ICD-10-CM | POA: Diagnosis not present

## 2019-08-28 DIAGNOSIS — D61810 Antineoplastic chemotherapy induced pancytopenia: Secondary | ICD-10-CM | POA: Diagnosis present

## 2019-08-28 DIAGNOSIS — R5081 Fever presenting with conditions classified elsewhere: Secondary | ICD-10-CM | POA: Diagnosis present

## 2019-08-28 DIAGNOSIS — M069 Rheumatoid arthritis, unspecified: Secondary | ICD-10-CM | POA: Diagnosis present

## 2019-08-28 DIAGNOSIS — K1120 Sialoadenitis, unspecified: Secondary | ICD-10-CM | POA: Diagnosis present

## 2019-08-28 DIAGNOSIS — Z006 Encounter for examination for normal comparison and control in clinical research program: Secondary | ICD-10-CM | POA: Diagnosis not present

## 2019-08-28 DIAGNOSIS — K1230 Oral mucositis (ulcerative), unspecified: Secondary | ICD-10-CM | POA: Diagnosis present

## 2019-08-28 DIAGNOSIS — I471 Supraventricular tachycardia: Secondary | ICD-10-CM | POA: Diagnosis not present

## 2019-08-29 DIAGNOSIS — D709 Neutropenia, unspecified: Secondary | ICD-10-CM | POA: Diagnosis not present

## 2019-08-29 DIAGNOSIS — D61810 Antineoplastic chemotherapy induced pancytopenia: Secondary | ICD-10-CM | POA: Diagnosis not present

## 2019-08-29 DIAGNOSIS — T451X5A Adverse effect of antineoplastic and immunosuppressive drugs, initial encounter: Secondary | ICD-10-CM | POA: Diagnosis not present

## 2019-08-29 DIAGNOSIS — C8330 Diffuse large B-cell lymphoma, unspecified site: Secondary | ICD-10-CM | POA: Diagnosis not present

## 2019-08-29 DIAGNOSIS — K1230 Oral mucositis (ulcerative), unspecified: Secondary | ICD-10-CM | POA: Diagnosis not present

## 2019-08-29 DIAGNOSIS — R5081 Fever presenting with conditions classified elsewhere: Secondary | ICD-10-CM | POA: Diagnosis not present

## 2019-08-30 DIAGNOSIS — R5081 Fever presenting with conditions classified elsewhere: Secondary | ICD-10-CM | POA: Diagnosis not present

## 2019-08-30 DIAGNOSIS — T451X5A Adverse effect of antineoplastic and immunosuppressive drugs, initial encounter: Secondary | ICD-10-CM | POA: Diagnosis not present

## 2019-08-30 DIAGNOSIS — D709 Neutropenia, unspecified: Secondary | ICD-10-CM | POA: Diagnosis not present

## 2019-08-30 DIAGNOSIS — K1230 Oral mucositis (ulcerative), unspecified: Secondary | ICD-10-CM | POA: Diagnosis not present

## 2019-08-30 DIAGNOSIS — D61810 Antineoplastic chemotherapy induced pancytopenia: Secondary | ICD-10-CM | POA: Diagnosis not present

## 2019-08-30 DIAGNOSIS — C8330 Diffuse large B-cell lymphoma, unspecified site: Secondary | ICD-10-CM | POA: Diagnosis not present

## 2019-08-31 DIAGNOSIS — K1230 Oral mucositis (ulcerative), unspecified: Secondary | ICD-10-CM | POA: Diagnosis not present

## 2019-08-31 DIAGNOSIS — D709 Neutropenia, unspecified: Secondary | ICD-10-CM | POA: Diagnosis not present

## 2019-08-31 DIAGNOSIS — T451X5A Adverse effect of antineoplastic and immunosuppressive drugs, initial encounter: Secondary | ICD-10-CM | POA: Diagnosis not present

## 2019-08-31 DIAGNOSIS — R5081 Fever presenting with conditions classified elsewhere: Secondary | ICD-10-CM | POA: Diagnosis not present

## 2019-08-31 DIAGNOSIS — C8330 Diffuse large B-cell lymphoma, unspecified site: Secondary | ICD-10-CM | POA: Diagnosis not present

## 2019-08-31 DIAGNOSIS — D61810 Antineoplastic chemotherapy induced pancytopenia: Secondary | ICD-10-CM | POA: Diagnosis not present

## 2019-09-01 DIAGNOSIS — C8330 Diffuse large B-cell lymphoma, unspecified site: Secondary | ICD-10-CM | POA: Diagnosis not present

## 2019-09-01 DIAGNOSIS — K1230 Oral mucositis (ulcerative), unspecified: Secondary | ICD-10-CM | POA: Diagnosis not present

## 2019-09-01 DIAGNOSIS — D709 Neutropenia, unspecified: Secondary | ICD-10-CM | POA: Diagnosis not present

## 2019-09-01 DIAGNOSIS — M109 Gout, unspecified: Secondary | ICD-10-CM | POA: Diagnosis not present

## 2019-09-01 DIAGNOSIS — R5081 Fever presenting with conditions classified elsewhere: Secondary | ICD-10-CM | POA: Diagnosis not present

## 2019-09-02 DIAGNOSIS — R5081 Fever presenting with conditions classified elsewhere: Secondary | ICD-10-CM | POA: Diagnosis not present

## 2019-09-02 DIAGNOSIS — K1230 Oral mucositis (ulcerative), unspecified: Secondary | ICD-10-CM | POA: Diagnosis not present

## 2019-09-02 DIAGNOSIS — M109 Gout, unspecified: Secondary | ICD-10-CM | POA: Diagnosis not present

## 2019-09-02 DIAGNOSIS — D709 Neutropenia, unspecified: Secondary | ICD-10-CM | POA: Diagnosis not present

## 2019-09-02 DIAGNOSIS — C8330 Diffuse large B-cell lymphoma, unspecified site: Secondary | ICD-10-CM | POA: Diagnosis not present

## 2019-09-08 DIAGNOSIS — C8330 Diffuse large B-cell lymphoma, unspecified site: Secondary | ICD-10-CM | POA: Diagnosis not present

## 2019-09-08 DIAGNOSIS — R300 Dysuria: Secondary | ICD-10-CM | POA: Diagnosis not present

## 2019-09-11 DIAGNOSIS — C8330 Diffuse large B-cell lymphoma, unspecified site: Secondary | ICD-10-CM | POA: Diagnosis not present

## 2019-09-18 DIAGNOSIS — Z9484 Stem cells transplant status: Secondary | ICD-10-CM | POA: Diagnosis not present

## 2019-09-18 DIAGNOSIS — Z006 Encounter for examination for normal comparison and control in clinical research program: Secondary | ICD-10-CM | POA: Diagnosis not present

## 2019-09-18 DIAGNOSIS — C8333 Diffuse large B-cell lymphoma, intra-abdominal lymph nodes: Secondary | ICD-10-CM | POA: Diagnosis not present

## 2019-10-06 DIAGNOSIS — C8330 Diffuse large B-cell lymphoma, unspecified site: Secondary | ICD-10-CM | POA: Diagnosis not present

## 2019-10-16 DIAGNOSIS — Z9484 Stem cells transplant status: Secondary | ICD-10-CM | POA: Diagnosis not present

## 2019-10-16 DIAGNOSIS — Z006 Encounter for examination for normal comparison and control in clinical research program: Secondary | ICD-10-CM | POA: Diagnosis not present

## 2019-10-16 DIAGNOSIS — C8333 Diffuse large B-cell lymphoma, intra-abdominal lymph nodes: Secondary | ICD-10-CM | POA: Diagnosis not present

## 2019-10-27 DIAGNOSIS — C8330 Diffuse large B-cell lymphoma, unspecified site: Secondary | ICD-10-CM | POA: Diagnosis not present

## 2019-11-03 DIAGNOSIS — M109 Gout, unspecified: Secondary | ICD-10-CM | POA: Diagnosis not present

## 2019-11-03 DIAGNOSIS — E785 Hyperlipidemia, unspecified: Secondary | ICD-10-CM | POA: Diagnosis not present

## 2019-11-03 DIAGNOSIS — C8330 Diffuse large B-cell lymphoma, unspecified site: Secondary | ICD-10-CM | POA: Diagnosis not present

## 2019-11-03 DIAGNOSIS — Z6831 Body mass index (BMI) 31.0-31.9, adult: Secondary | ICD-10-CM | POA: Diagnosis not present

## 2019-11-17 DIAGNOSIS — C8330 Diffuse large B-cell lymphoma, unspecified site: Secondary | ICD-10-CM | POA: Diagnosis not present

## 2019-11-26 DIAGNOSIS — C8338 Diffuse large B-cell lymphoma, lymph nodes of multiple sites: Secondary | ICD-10-CM | POA: Diagnosis not present

## 2019-11-26 DIAGNOSIS — C8333 Diffuse large B-cell lymphoma, intra-abdominal lymph nodes: Secondary | ICD-10-CM | POA: Diagnosis not present

## 2019-11-26 DIAGNOSIS — K5730 Diverticulosis of large intestine without perforation or abscess without bleeding: Secondary | ICD-10-CM | POA: Diagnosis not present

## 2019-11-26 DIAGNOSIS — Z881 Allergy status to other antibiotic agents status: Secondary | ICD-10-CM | POA: Diagnosis not present

## 2019-11-26 DIAGNOSIS — Z9484 Stem cells transplant status: Secondary | ICD-10-CM | POA: Diagnosis not present

## 2019-11-26 DIAGNOSIS — Z884 Allergy status to anesthetic agent status: Secondary | ICD-10-CM | POA: Diagnosis not present

## 2019-11-26 DIAGNOSIS — K8020 Calculus of gallbladder without cholecystitis without obstruction: Secondary | ICD-10-CM | POA: Diagnosis not present

## 2019-11-26 DIAGNOSIS — I708 Atherosclerosis of other arteries: Secondary | ICD-10-CM | POA: Diagnosis not present

## 2019-12-15 DIAGNOSIS — C8330 Diffuse large B-cell lymphoma, unspecified site: Secondary | ICD-10-CM | POA: Diagnosis not present

## 2019-12-25 DIAGNOSIS — H43393 Other vitreous opacities, bilateral: Secondary | ICD-10-CM | POA: Diagnosis not present

## 2019-12-25 DIAGNOSIS — H26493 Other secondary cataract, bilateral: Secondary | ICD-10-CM | POA: Diagnosis not present

## 2020-01-03 DIAGNOSIS — Z1231 Encounter for screening mammogram for malignant neoplasm of breast: Secondary | ICD-10-CM | POA: Diagnosis not present

## 2020-01-12 DIAGNOSIS — C8330 Diffuse large B-cell lymphoma, unspecified site: Secondary | ICD-10-CM | POA: Diagnosis not present

## 2020-02-11 DIAGNOSIS — Z79899 Other long term (current) drug therapy: Secondary | ICD-10-CM | POA: Diagnosis not present

## 2020-02-11 DIAGNOSIS — C8333 Diffuse large B-cell lymphoma, intra-abdominal lymph nodes: Secondary | ICD-10-CM | POA: Diagnosis not present

## 2020-02-11 DIAGNOSIS — Z9484 Stem cells transplant status: Secondary | ICD-10-CM | POA: Diagnosis not present

## 2020-02-11 DIAGNOSIS — C8338 Diffuse large B-cell lymphoma, lymph nodes of multiple sites: Secondary | ICD-10-CM | POA: Diagnosis not present

## 2020-02-11 DIAGNOSIS — Z23 Encounter for immunization: Secondary | ICD-10-CM | POA: Diagnosis not present

## 2020-03-10 DIAGNOSIS — C8330 Diffuse large B-cell lymphoma, unspecified site: Secondary | ICD-10-CM | POA: Diagnosis not present

## 2020-04-07 DIAGNOSIS — C8380 Other non-follicular lymphoma, unspecified site: Secondary | ICD-10-CM | POA: Diagnosis not present

## 2020-04-07 DIAGNOSIS — Z9484 Stem cells transplant status: Secondary | ICD-10-CM | POA: Diagnosis not present

## 2020-04-07 DIAGNOSIS — C8330 Diffuse large B-cell lymphoma, unspecified site: Secondary | ICD-10-CM | POA: Diagnosis not present

## 2020-04-13 DIAGNOSIS — C8330 Diffuse large B-cell lymphoma, unspecified site: Secondary | ICD-10-CM | POA: Diagnosis not present

## 2020-04-13 DIAGNOSIS — D1809 Hemangioma of other sites: Secondary | ICD-10-CM | POA: Diagnosis not present

## 2020-04-13 DIAGNOSIS — I700 Atherosclerosis of aorta: Secondary | ICD-10-CM | POA: Diagnosis not present

## 2020-04-13 DIAGNOSIS — R590 Localized enlarged lymph nodes: Secondary | ICD-10-CM | POA: Diagnosis not present

## 2020-05-05 DIAGNOSIS — Z006 Encounter for examination for normal comparison and control in clinical research program: Secondary | ICD-10-CM | POA: Diagnosis not present

## 2020-05-05 DIAGNOSIS — C8338 Diffuse large B-cell lymphoma, lymph nodes of multiple sites: Secondary | ICD-10-CM | POA: Diagnosis not present

## 2020-05-05 DIAGNOSIS — Z23 Encounter for immunization: Secondary | ICD-10-CM | POA: Diagnosis not present

## 2020-05-05 DIAGNOSIS — Z9484 Stem cells transplant status: Secondary | ICD-10-CM | POA: Diagnosis not present

## 2020-05-05 DIAGNOSIS — C8333 Diffuse large B-cell lymphoma, intra-abdominal lymph nodes: Secondary | ICD-10-CM | POA: Diagnosis not present

## 2020-05-05 DIAGNOSIS — Z79899 Other long term (current) drug therapy: Secondary | ICD-10-CM | POA: Diagnosis not present

## 2020-06-04 DIAGNOSIS — C8330 Diffuse large B-cell lymphoma, unspecified site: Secondary | ICD-10-CM | POA: Diagnosis not present

## 2020-07-01 DIAGNOSIS — C8330 Diffuse large B-cell lymphoma, unspecified site: Secondary | ICD-10-CM | POA: Diagnosis not present

## 2020-07-28 DIAGNOSIS — Z79899 Other long term (current) drug therapy: Secondary | ICD-10-CM | POA: Diagnosis not present

## 2020-07-28 DIAGNOSIS — C8333 Diffuse large B-cell lymphoma, intra-abdominal lymph nodes: Secondary | ICD-10-CM | POA: Diagnosis not present

## 2020-07-28 DIAGNOSIS — Z006 Encounter for examination for normal comparison and control in clinical research program: Secondary | ICD-10-CM | POA: Diagnosis not present

## 2020-07-28 DIAGNOSIS — C8338 Diffuse large B-cell lymphoma, lymph nodes of multiple sites: Secondary | ICD-10-CM | POA: Diagnosis not present

## 2020-07-28 DIAGNOSIS — Z23 Encounter for immunization: Secondary | ICD-10-CM | POA: Diagnosis not present

## 2020-07-28 DIAGNOSIS — Z9484 Stem cells transplant status: Secondary | ICD-10-CM | POA: Diagnosis not present

## 2020-07-28 DIAGNOSIS — Z87891 Personal history of nicotine dependence: Secondary | ICD-10-CM | POA: Diagnosis not present

## 2020-08-04 DIAGNOSIS — D485 Neoplasm of uncertain behavior of skin: Secondary | ICD-10-CM | POA: Diagnosis not present

## 2020-08-04 DIAGNOSIS — L814 Other melanin hyperpigmentation: Secondary | ICD-10-CM | POA: Diagnosis not present

## 2020-08-04 DIAGNOSIS — L820 Inflamed seborrheic keratosis: Secondary | ICD-10-CM | POA: Diagnosis not present

## 2020-08-16 DIAGNOSIS — Z9221 Personal history of antineoplastic chemotherapy: Secondary | ICD-10-CM | POA: Diagnosis not present

## 2020-08-16 DIAGNOSIS — J342 Deviated nasal septum: Secondary | ICD-10-CM | POA: Diagnosis not present

## 2020-08-16 DIAGNOSIS — Z8572 Personal history of non-Hodgkin lymphomas: Secondary | ICD-10-CM | POA: Diagnosis not present

## 2020-08-16 DIAGNOSIS — Z9484 Stem cells transplant status: Secondary | ICD-10-CM | POA: Diagnosis not present

## 2020-08-16 DIAGNOSIS — J392 Other diseases of pharynx: Secondary | ICD-10-CM | POA: Diagnosis not present

## 2020-09-14 DIAGNOSIS — C44722 Squamous cell carcinoma of skin of right lower limb, including hip: Secondary | ICD-10-CM | POA: Diagnosis not present

## 2020-09-28 DIAGNOSIS — Z23 Encounter for immunization: Secondary | ICD-10-CM | POA: Diagnosis not present

## 2020-10-19 DIAGNOSIS — Z1339 Encounter for screening examination for other mental health and behavioral disorders: Secondary | ICD-10-CM | POA: Diagnosis not present

## 2020-10-19 DIAGNOSIS — Z1331 Encounter for screening for depression: Secondary | ICD-10-CM | POA: Diagnosis not present

## 2020-10-19 DIAGNOSIS — Z0000 Encounter for general adult medical examination without abnormal findings: Secondary | ICD-10-CM | POA: Diagnosis not present

## 2020-10-19 DIAGNOSIS — Z6833 Body mass index (BMI) 33.0-33.9, adult: Secondary | ICD-10-CM | POA: Diagnosis not present

## 2020-10-21 ENCOUNTER — Other Ambulatory Visit: Payer: Self-pay | Admitting: Hematology and Oncology

## 2020-10-21 DIAGNOSIS — C8338 Diffuse large B-cell lymphoma, lymph nodes of multiple sites: Secondary | ICD-10-CM

## 2020-11-01 ENCOUNTER — Other Ambulatory Visit: Payer: Self-pay | Admitting: Oncology

## 2020-11-01 DIAGNOSIS — C8338 Diffuse large B-cell lymphoma, lymph nodes of multiple sites: Secondary | ICD-10-CM

## 2020-11-01 NOTE — Progress Notes (Signed)
[facility]  [address] [place],  [place]  [address] [phone]  Clinic Day:  11/02/2020  Referring physician: [name], MD   This document serves as a record of services personally performed by Hosie Poisson, MD. It was created on their behalf by Curry,Lauren E, a trained medical scribe. The creation of this record is based on the scribe's personal observations and the provider's statements to them.   CHIEF COMPLAINT:  CC: History of stage IV high grade large B-cell lymphoma  Current Treatment:  Surveillance   HISTORY OF PRESENT ILLNESS:  Kristina Torres is a 74 y.o. female with a history of stage IV high-grade diffuse large B-cell lymphoma initially treated in 2018 with R-CHOP and intrathecal methotrexate, completed in January of 2019. She had a fairly complicated course, but finally recovered.  She was found to have recurrent disease in June 2020.  PET scan was positive in the retroperitoneal adenopathy but not elsewhere.  She saw Dr. Cassell Clement at [facility] and she agreed that Roniya was a candidate for autologous stem cell transplant.  We were unable to obtain a tissue confirmation as the only possible approach would be an open laparotomy.  Dr.  Noberto Retort has reviewed the case and feels she would need to go to a tertiary care center for this surgery.  We felt the morbidity of that surgery and the delay in starting her treatment was not worth the additional information.  Dr. Cassell Clement agreed.  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  Dr. Cassell Clement recommended two cycles of R-ICE followed by a PET scan.  The patient had her first cycle on June 23rd, and she did very well with no nausea, bowel problems, hematuria, or fever.  With her first cycle of R-ICE, her white count did go down to 0.6 and her platelets went as low as 6,000, so she did receive a plateletpheresis.  She had  no fever or infection.  She received platelet pheresis in July after her 2nd cycle of R-ICE.  She was seen on August 10th after 2 cycles of R-ICE.  Repeat PET scan, revealed a marked interval response to therapy of the intensely hypermetabolic retroperitoneal abdominal lymphadenopathy seen on the prior PET-CT, as well as revealed a stable size of a calcified precarinal lymph node that reveal low level hypermetabolism.  Bone marrow biopsy done the week prior showed hypercellularity with plenty of iron stores.  In preparation for stem cell collection, she underwent pegfilgrastim injections August 15th through the 17th and also received Mozobil, followed by stem cell collection.  She underwent stem cell transplant on September 3rd, 2020.  A Hickman catheter was placed while she was admitted for transplant, but she went into ventricular tachycardia, so this was subsequently removed.  After transplant, she was admitted to the hospital due to a low blood count and received platelets twice.  She discharged on the 12th day.  When we saw her in September, she was found to have an E coli urinary infection, which was treated with full dose Bactrim DS.  She was placed on Bactrim DS prophylaxis once daily on Mondays, Wednesdays, and Fridays on October 2nd for 3 months.  She continues valacyclovir 500 mg twice daily, as well as Bactrim 3 times weekly.  She was seen again at [facility] on October 29th.  She is on a double blinded clinical trial evaluating ibrutinib versus placebo posttransplant and  takes 4 capsules daily.  She saw Dr. Cassell Clement on December 9th for her day 100.  Mammogram from January 16th 2021 was clear.  She was seen at [facility] by Dr. Cassell Clement on February 24th and PET imaging was clear.  At that time she underwent multiple vaccines and labs. CT chest, abdomen and pelvis from April 27th revealed no evidence of lymphoma recurrence, no lymphadenopathy, and a stable large hepatic hemangioma.     INTERVAL HISTORY:   Kristina Torres is here for follow up and states that she has been well.  She completed her clinical trial in mid September, and is scheduled with Dr. Cassell Clement in February for scans.  She is up to date on all her vaccinations.  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 Jimmye Norman office, which is healing well.  Blood counts and chemistries are unremarkable.  Her  appetite is good, and she has gained 6 pounds since her last visit.  She denies fever, chills or other signs of infection.  She denies nausea, vomiting, bowel issues, or abdominal pain.  She denies sore throat, cough, dyspnea, or chest pain.   REVIEW OF SYSTEMS:  Review of Systems  Cardiovascular: Positive for palpitations (occasional).  All other systems reviewed and are negative.    VITALS:  Blood pressure (!) 143/72, pulse 71, temperature 97.6 F (36.4 C), temperature source Oral, resp. rate 18, height $RemoveBe'4\' 10"'yUVzlQuPz$  (1.473 m), weight 161 lb 3.2 oz (73.1 kg), SpO2 99 %.  Wt Readings from Last 3 Encounters:  11/02/20 161 lb 3.2 oz (73.1 kg)  12/30/18 152 lb 11.2 oz (69.3 kg)  08/02/18 150 lb 8 oz (68.3 kg)    Body mass index is 33.69 kg/m.  Performance status (ECOG): 0 - Asymptomatic  PHYSICAL EXAM:  Physical Exam Constitutional:      General: She is not in acute distress.    Appearance: Normal appearance. She is normal weight.  HENT:     Head: Normocephalic and atraumatic.  Eyes:     General: No scleral icterus.    Extraocular Movements: Extraocular movements intact.     Conjunctiva/sclera: Conjunctivae normal.     Pupils: Pupils are equal, round, and reactive to light.  Cardiovascular:     Rate and Rhythm: Normal rate and regular rhythm.     Pulses: Normal pulses.     Heart sounds: Normal heart sounds. No murmur heard.  No friction rub. No gallop.   Pulmonary:     Effort: Pulmonary effort is normal. No respiratory distress.     Breath sounds: Normal breath sounds.  Abdominal:     General: Bowel sounds  are normal. There is no distension.     Palpations: Abdomen is soft. There is no mass.     Tenderness: There is no abdominal tenderness.  Musculoskeletal:        General: Normal range of motion.     Cervical back: Normal range of motion and neck supple.     Right lower leg: No edema.     Left lower leg: No edema.     Comments: The posterior right calf has a large scar which is healing well.  Lymphadenopathy:     Cervical: No cervical adenopathy.  Skin:    General: Skin is warm and dry.  Neurological:     General: No focal deficit present.     Mental Status: She is alert and oriented to person, place, and time. Mental status is at baseline.  Psychiatric:  Mood and Affect: Mood normal.        Behavior: Behavior normal.        Thought Content: Thought content normal.        Judgment: Judgment normal.    LABS:   CBC Latest Ref Rng & Units 12/30/2018 08/02/2018 11/19/2017  WBC 4.0 - 10.5 K/uL - - 9.9  Hemoglobin 12.0 - 15.0 g/dL 13.6 12.9 7.9(L)  Hematocrit 36 - 46 % 40.0 38.0 23.1(L)  Platelets 150 - 400 K/uL - - 237   CMP Latest Ref Rng & Units 12/30/2018 08/02/2018 11/19/2017  Glucose 70 - 99 mg/dL 103(H) 113(H) 125(H)  BUN 8 - 23 mg/dL - 20 13  Creatinine 0.44 - 1.00 mg/dL - 0.80 0.58  Sodium 135 - 145 mmol/L 142 140 133(L)  Potassium 3.5 - 5.1 mmol/L 3.7 4.1 2.7(LL)  Chloride 98 - 111 mmol/L - 107 95(L)  CO2 22 - 32 mmol/L - - 27  Calcium 8.9 - 10.3 mg/dL - - 8.7(L)  Total Protein 6.5 - 8.1 g/dL - - -  Total Bilirubin 0.3 - 1.2 mg/dL - - -  Alkaline Phos 38 - 126 U/L - - -  AST 15 - 41 U/L - - -  ALT 14 - 54 U/L - - -     Lab Results  Component Value Date   TOTALPROTELP 3.5 (L) 08/24/2017   Lab Results  Component Value Date   TIBC 214 (L) 08/21/2017   FERRITIN 745 (H) 08/21/2017   IRONPCTSAT 14 08/21/2017   Lab Results  Component Value Date   LDH 602 (H) 08/30/2017   LDH 562 (H) 08/20/2017     STUDIES:  No results found.   Allergies:  Allergies   Allergen Reactions  . Midazolam Other (See Comments)    Cannot use versed per dr Melina Copa gi doctor Cannot use versed per dr butler gi doctor  . Streptomycin Anaphylaxis, Other (See Comments) and Swelling    Numb tongue, fingers and mouth Numb tongue, fingers and mouth     Current Medications: Current Outpatient Medications  Medication Sig Dispense Refill  . magnesium oxide (MAG-OX) 400 MG tablet Take by mouth.    . traMADol (ULTRAM) 50 MG tablet Take by mouth.    . valACYclovir (VALTREX) 500 MG tablet Take by mouth.    Marland Kitchen allopurinol (ZYLOPRIM) 300 MG tablet Take 300 mg by mouth every morning.     . Cholecalciferol 25 MCG (1000 UT) tablet Take by mouth.    . docusate sodium (COLACE) 100 MG capsule Take 100 mg by mouth daily as needed.     . furosemide (LASIX) 20 MG tablet Take 20 mg by mouth as needed.     . potassium chloride (KLOR-CON) 20 MEQ packet Take 20 mEq by mouth as needed.      No current facility-administered medications for this visit.     ASSESSMENT & PLAN:   Assessment:   1.  Recurrent diffuse large B-cell lymphoma, which had originally been diagnosed in September 2018, and treated with 6 cycles of R-CHOP, as well as intrathecal chemotherapy.  She then relapsed in June of 2020.  She received 2 cycles of R-ICE, which she tolerated well with an excellent response.  She was essentially in complete remission by PET scan, so underwent autologous stem cell transplant in September 2020.  PET scan in February 2021, and CT imaging from April remains negative.  Her last scans in February looked good at [facility].  She completed her ibrutinib clinical  trial in September 2021.     Plan: She continues to do fairly well since the procedure and has completed her clinical trial through [facility] using ibrutinib, post transplant, in September.  I find no evidence of disease.  We will flush her port today, and I will add an A1c to her labs as requested.  She will follow up with Dr.  Cassell Clement in 3 months with repeat scans and labs.  We will see her back in 6 months with CBC and CMP for repeat examination and port flush.  The patient understands plans discussed today and is in agreement with them.  She knows that she can call with any questions or concerns.   I provided 20 minutes of face-to-face time during this this encounter and > 50% was spent counseling as documented under my assessment and plan.    [name], MD [facility] AT [facility] [address] [place] [address] Dept: [phone] Dept Fax: [phone]   I, Rita Ohara, am acting as scribe for [name], MD  I have reviewed this report as typed by the medical scribe, and it is complete and accurate.

## 2020-11-02 ENCOUNTER — Telehealth: Payer: Self-pay | Admitting: Oncology

## 2020-11-02 ENCOUNTER — Other Ambulatory Visit: Payer: Self-pay | Admitting: Hematology and Oncology

## 2020-11-02 ENCOUNTER — Other Ambulatory Visit: Payer: Self-pay

## 2020-11-02 ENCOUNTER — Inpatient Hospital Stay (INDEPENDENT_AMBULATORY_CARE_PROVIDER_SITE_OTHER): Payer: Medicare Other | Admitting: Oncology

## 2020-11-02 ENCOUNTER — Encounter: Payer: Self-pay | Admitting: Oncology

## 2020-11-02 ENCOUNTER — Inpatient Hospital Stay: Payer: Medicare Other | Attending: Oncology

## 2020-11-02 ENCOUNTER — Other Ambulatory Visit: Payer: Self-pay | Admitting: Oncology

## 2020-11-02 VITALS — BP 143/72 | HR 71 | Temp 97.6°F | Resp 18 | Ht <= 58 in | Wt 161.2 lb

## 2020-11-02 DIAGNOSIS — C8338 Diffuse large B-cell lymphoma, lymph nodes of multiple sites: Secondary | ICD-10-CM | POA: Insufficient documentation

## 2020-11-02 DIAGNOSIS — Z9484 Stem cells transplant status: Secondary | ICD-10-CM | POA: Diagnosis not present

## 2020-11-02 DIAGNOSIS — Z79899 Other long term (current) drug therapy: Secondary | ICD-10-CM | POA: Insufficient documentation

## 2020-11-02 DIAGNOSIS — R739 Hyperglycemia, unspecified: Secondary | ICD-10-CM

## 2020-11-02 DIAGNOSIS — D649 Anemia, unspecified: Secondary | ICD-10-CM | POA: Diagnosis not present

## 2020-11-02 DIAGNOSIS — R002 Palpitations: Secondary | ICD-10-CM | POA: Diagnosis not present

## 2020-11-02 DIAGNOSIS — Z9221 Personal history of antineoplastic chemotherapy: Secondary | ICD-10-CM | POA: Insufficient documentation

## 2020-11-02 DIAGNOSIS — Z0001 Encounter for general adult medical examination with abnormal findings: Secondary | ICD-10-CM | POA: Diagnosis not present

## 2020-11-02 LAB — LACTATE DEHYDROGENASE: LDH: 136 U/L (ref 98–192)

## 2020-11-02 LAB — CBC AND DIFFERENTIAL
HCT: 43 (ref 36–46)
Hemoglobin: 14 (ref 12.0–16.0)
Neutrophils Absolute: 2.74
Platelets: 186 (ref 150–399)
WBC: 5.7

## 2020-11-02 LAB — HEPATIC FUNCTION PANEL
ALT: 13 (ref 7–35)
AST: 20 (ref 13–35)
Alkaline Phosphatase: 85 (ref 25–125)
Bilirubin, Total: 0.9

## 2020-11-02 LAB — BASIC METABOLIC PANEL
BUN: 15 (ref 4–21)
CO2: 30 — AB (ref 13–22)
Chloride: 104 (ref 99–108)
Creatinine: 0.9 (ref 0.5–1.1)
Glucose: 123
Potassium: 3.9 (ref 3.4–5.3)
Sodium: 140 (ref 137–147)

## 2020-11-02 LAB — COMPREHENSIVE METABOLIC PANEL
Albumin: 4.2 (ref 3.5–5.0)
Calcium: 9.8 (ref 8.7–10.7)

## 2020-11-02 LAB — CBC: RBC: 4.08 (ref 3.87–5.11)

## 2020-11-02 NOTE — Telephone Encounter (Signed)
Per 11/16 LOS patient scheduled for 11/16 Appt's -

## 2020-12-27 DIAGNOSIS — H43393 Other vitreous opacities, bilateral: Secondary | ICD-10-CM | POA: Diagnosis not present

## 2020-12-27 DIAGNOSIS — H26493 Other secondary cataract, bilateral: Secondary | ICD-10-CM | POA: Diagnosis not present

## 2020-12-27 DIAGNOSIS — H35372 Puckering of macula, left eye: Secondary | ICD-10-CM | POA: Diagnosis not present

## 2020-12-27 DIAGNOSIS — H43813 Vitreous degeneration, bilateral: Secondary | ICD-10-CM | POA: Diagnosis not present

## 2020-12-27 DIAGNOSIS — H524 Presbyopia: Secondary | ICD-10-CM | POA: Diagnosis not present

## 2021-02-01 DIAGNOSIS — H26491 Other secondary cataract, right eye: Secondary | ICD-10-CM | POA: Diagnosis not present

## 2021-02-09 DIAGNOSIS — C8338 Diffuse large B-cell lymphoma, lymph nodes of multiple sites: Secondary | ICD-10-CM | POA: Diagnosis not present

## 2021-02-09 DIAGNOSIS — Z9484 Stem cells transplant status: Secondary | ICD-10-CM | POA: Diagnosis not present

## 2021-02-09 DIAGNOSIS — Z006 Encounter for examination for normal comparison and control in clinical research program: Secondary | ICD-10-CM | POA: Diagnosis not present

## 2021-02-09 DIAGNOSIS — C8333 Diffuse large B-cell lymphoma, intra-abdominal lymph nodes: Secondary | ICD-10-CM | POA: Diagnosis not present

## 2021-02-09 DIAGNOSIS — Z23 Encounter for immunization: Secondary | ICD-10-CM | POA: Diagnosis not present

## 2021-02-09 DIAGNOSIS — I498 Other specified cardiac arrhythmias: Secondary | ICD-10-CM | POA: Diagnosis not present

## 2021-02-10 DIAGNOSIS — R001 Bradycardia, unspecified: Secondary | ICD-10-CM | POA: Diagnosis not present

## 2021-02-14 DIAGNOSIS — S89302A Unspecified physeal fracture of lower end of left fibula, initial encounter for closed fracture: Secondary | ICD-10-CM | POA: Diagnosis not present

## 2021-02-14 DIAGNOSIS — S8252XA Displaced fracture of medial malleolus of left tibia, initial encounter for closed fracture: Secondary | ICD-10-CM | POA: Diagnosis not present

## 2021-02-14 DIAGNOSIS — S82402A Unspecified fracture of shaft of left fibula, initial encounter for closed fracture: Secondary | ICD-10-CM | POA: Diagnosis not present

## 2021-02-14 DIAGNOSIS — S82832A Other fracture of upper and lower end of left fibula, initial encounter for closed fracture: Secondary | ICD-10-CM | POA: Diagnosis not present

## 2021-02-14 DIAGNOSIS — Z888 Allergy status to other drugs, medicaments and biological substances status: Secondary | ICD-10-CM | POA: Diagnosis not present

## 2021-02-14 DIAGNOSIS — G8911 Acute pain due to trauma: Secondary | ICD-10-CM | POA: Diagnosis not present

## 2021-02-14 DIAGNOSIS — Z79899 Other long term (current) drug therapy: Secondary | ICD-10-CM | POA: Diagnosis not present

## 2021-02-14 DIAGNOSIS — Z8572 Personal history of non-Hodgkin lymphomas: Secondary | ICD-10-CM | POA: Diagnosis not present

## 2021-02-14 DIAGNOSIS — Z881 Allergy status to other antibiotic agents status: Secondary | ICD-10-CM | POA: Diagnosis not present

## 2021-02-21 DIAGNOSIS — S82892A Other fracture of left lower leg, initial encounter for closed fracture: Secondary | ICD-10-CM | POA: Diagnosis not present

## 2021-02-21 DIAGNOSIS — S82852D Displaced trimalleolar fracture of left lower leg, subsequent encounter for closed fracture with routine healing: Secondary | ICD-10-CM | POA: Diagnosis not present

## 2021-02-22 DIAGNOSIS — Z20822 Contact with and (suspected) exposure to covid-19: Secondary | ICD-10-CM | POA: Diagnosis not present

## 2021-02-22 DIAGNOSIS — S82832A Other fracture of upper and lower end of left fibula, initial encounter for closed fracture: Secondary | ICD-10-CM | POA: Diagnosis not present

## 2021-02-25 DIAGNOSIS — Z79899 Other long term (current) drug therapy: Secondary | ICD-10-CM | POA: Diagnosis not present

## 2021-02-25 DIAGNOSIS — E785 Hyperlipidemia, unspecified: Secondary | ICD-10-CM | POA: Diagnosis not present

## 2021-02-25 DIAGNOSIS — S82852A Displaced trimalleolar fracture of left lower leg, initial encounter for closed fracture: Secondary | ICD-10-CM | POA: Diagnosis not present

## 2021-02-25 DIAGNOSIS — Z881 Allergy status to other antibiotic agents status: Secondary | ICD-10-CM | POA: Diagnosis not present

## 2021-02-25 DIAGNOSIS — Z888 Allergy status to other drugs, medicaments and biological substances status: Secondary | ICD-10-CM | POA: Diagnosis not present

## 2021-02-25 DIAGNOSIS — Z87891 Personal history of nicotine dependence: Secondary | ICD-10-CM | POA: Diagnosis not present

## 2021-02-25 DIAGNOSIS — Z9481 Bone marrow transplant status: Secondary | ICD-10-CM | POA: Diagnosis not present

## 2021-02-25 DIAGNOSIS — Z8572 Personal history of non-Hodgkin lymphomas: Secondary | ICD-10-CM | POA: Diagnosis not present

## 2021-03-14 DIAGNOSIS — S82852D Displaced trimalleolar fracture of left lower leg, subsequent encounter for closed fracture with routine healing: Secondary | ICD-10-CM | POA: Diagnosis not present

## 2021-03-14 DIAGNOSIS — S82892A Other fracture of left lower leg, initial encounter for closed fracture: Secondary | ICD-10-CM | POA: Diagnosis not present

## 2021-04-18 DIAGNOSIS — S82852D Displaced trimalleolar fracture of left lower leg, subsequent encounter for closed fracture with routine healing: Secondary | ICD-10-CM | POA: Diagnosis not present

## 2021-05-02 ENCOUNTER — Other Ambulatory Visit: Payer: Self-pay | Admitting: Hematology and Oncology

## 2021-05-02 ENCOUNTER — Ambulatory Visit: Payer: Medicare Other | Admitting: Oncology

## 2021-05-02 ENCOUNTER — Inpatient Hospital Stay: Payer: Medicare Other

## 2021-05-02 ENCOUNTER — Encounter: Payer: Self-pay | Admitting: Hematology and Oncology

## 2021-05-02 ENCOUNTER — Inpatient Hospital Stay: Payer: Medicare Other | Attending: Oncology | Admitting: Hematology and Oncology

## 2021-05-02 ENCOUNTER — Other Ambulatory Visit: Payer: Medicare Other

## 2021-05-02 ENCOUNTER — Telehealth: Payer: Self-pay | Admitting: Hematology and Oncology

## 2021-05-02 ENCOUNTER — Other Ambulatory Visit: Payer: Self-pay

## 2021-05-02 VITALS — BP 131/68 | HR 59 | Temp 98.0°F | Resp 18

## 2021-05-02 DIAGNOSIS — Z8572 Personal history of non-Hodgkin lymphomas: Secondary | ICD-10-CM | POA: Insufficient documentation

## 2021-05-02 DIAGNOSIS — C8330 Diffuse large B-cell lymphoma, unspecified site: Secondary | ICD-10-CM | POA: Diagnosis not present

## 2021-05-02 DIAGNOSIS — Z9484 Stem cells transplant status: Secondary | ICD-10-CM | POA: Insufficient documentation

## 2021-05-02 DIAGNOSIS — C8338 Diffuse large B-cell lymphoma, lymph nodes of multiple sites: Secondary | ICD-10-CM | POA: Diagnosis not present

## 2021-05-02 DIAGNOSIS — Z9221 Personal history of antineoplastic chemotherapy: Secondary | ICD-10-CM | POA: Insufficient documentation

## 2021-05-02 DIAGNOSIS — D649 Anemia, unspecified: Secondary | ICD-10-CM | POA: Diagnosis not present

## 2021-05-02 LAB — BASIC METABOLIC PANEL
BUN: 10 (ref 4–21)
CO2: 25 — AB (ref 13–22)
Chloride: 104 (ref 99–108)
Creatinine: 0.7 (ref 0.5–1.1)
Glucose: 120
Potassium: 3.7 (ref 3.4–5.3)
Sodium: 138 (ref 137–147)

## 2021-05-02 LAB — LACTATE DEHYDROGENASE: LDH: 134 U/L (ref 98–192)

## 2021-05-02 LAB — HEPATIC FUNCTION PANEL
ALT: 11 (ref 7–35)
AST: 15 (ref 13–35)
Alkaline Phosphatase: 83 (ref 25–125)
Bilirubin, Total: 0.9

## 2021-05-02 LAB — CBC AND DIFFERENTIAL
HCT: 38 (ref 36–46)
Hemoglobin: 12.7 (ref 12.0–16.0)
Neutrophils Absolute: 2.02
Platelets: 204 (ref 150–399)
WBC: 4.4

## 2021-05-02 LAB — COMPREHENSIVE METABOLIC PANEL
Albumin: 4.1 (ref 3.5–5.0)
Calcium: 9 (ref 8.7–10.7)

## 2021-05-02 LAB — CBC: RBC: 3.82 — AB (ref 3.87–5.11)

## 2021-05-02 MED ORDER — HEPARIN SOD (PORK) LOCK FLUSH 100 UNIT/ML IV SOLN
500.0000 [IU] | Freq: Once | INTRAVENOUS | Status: AC | PRN
  Administered 2021-05-02: 500 [IU]
  Filled 2021-05-02: qty 5

## 2021-05-02 MED ORDER — SODIUM CHLORIDE 0.9% FLUSH
10.0000 mL | INTRAVENOUS | Status: DC | PRN
  Administered 2021-05-02: 10 mL
  Filled 2021-05-02: qty 10

## 2021-05-02 MED ORDER — VALACYCLOVIR HCL 500 MG PO TABS: 500.0000 mg | ORAL_TABLET | Freq: Three times a day (TID) | ORAL | 3 refills | Status: DC
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
## 2021-05-02 NOTE — Telephone Encounter (Signed)
Per 5/16 LOS, patient scheduled for Nov Appt's.  Patient entered Appt's in her phone

## 2021-05-19 DIAGNOSIS — S82852D Displaced trimalleolar fracture of left lower leg, subsequent encounter for closed fracture with routine healing: Secondary | ICD-10-CM | POA: Diagnosis not present

## 2021-07-04 DIAGNOSIS — S82852D Displaced trimalleolar fracture of left lower leg, subsequent encounter for closed fracture with routine healing: Secondary | ICD-10-CM | POA: Diagnosis not present

## 2021-07-20 DIAGNOSIS — Z20822 Contact with and (suspected) exposure to covid-19: Secondary | ICD-10-CM | POA: Diagnosis not present

## 2021-08-02 DIAGNOSIS — E215 Disorder of parathyroid gland, unspecified: Secondary | ICD-10-CM | POA: Insufficient documentation

## 2021-08-03 DIAGNOSIS — K0889 Other specified disorders of teeth and supporting structures: Secondary | ICD-10-CM | POA: Diagnosis not present

## 2021-08-03 DIAGNOSIS — R9389 Abnormal findings on diagnostic imaging of other specified body structures: Secondary | ICD-10-CM | POA: Diagnosis not present

## 2021-08-03 DIAGNOSIS — Z9484 Stem cells transplant status: Secondary | ICD-10-CM | POA: Diagnosis not present

## 2021-08-03 DIAGNOSIS — Z006 Encounter for examination for normal comparison and control in clinical research program: Secondary | ICD-10-CM | POA: Diagnosis not present

## 2021-08-03 DIAGNOSIS — C8333 Diffuse large B-cell lymphoma, intra-abdominal lymph nodes: Secondary | ICD-10-CM | POA: Diagnosis not present

## 2021-08-03 DIAGNOSIS — Z23 Encounter for immunization: Secondary | ICD-10-CM | POA: Diagnosis not present

## 2021-09-02 ENCOUNTER — Telehealth: Payer: Self-pay | Admitting: Oncology

## 2021-09-02 NOTE — Telephone Encounter (Signed)
Returning patient's call - Verifying 11/16 Labs, Follow Up

## 2021-09-08 DIAGNOSIS — Z23 Encounter for immunization: Secondary | ICD-10-CM | POA: Diagnosis not present

## 2021-10-03 DIAGNOSIS — S82852D Displaced trimalleolar fracture of left lower leg, subsequent encounter for closed fracture with routine healing: Secondary | ICD-10-CM | POA: Diagnosis not present

## 2021-10-26 ENCOUNTER — Telehealth: Payer: Self-pay | Admitting: Oncology

## 2021-10-26 NOTE — Telephone Encounter (Signed)
Patient notified of Appt Time Change on 11/16 to 2:30 pm

## 2021-10-26 NOTE — Progress Notes (Signed)
[place]  [address] [place],  [place]  [address] [phone]  Clinic Day:  11/02/2021  Referring physician: [name], MD   This document serves as a record of services personally performed by Hosie Poisson, MD. It was created on their behalf by Curry,Lauren E, a trained medical scribe. The creation of this record is based on the scribe's personal observations and the provider's statements to them.  CHIEF COMPLAINT:  CC: Stage IV high grade large B-cell lymphoma   Current Treatment:  Surveillance   HISTORY OF PRESENT ILLNESS:  Kristina Torres is a 75 y.o. female with a history of stage IV high-grade diffuse large B-cell lymphoma initially treated in 2018 with R-CHOP and intrathecal methotrexate, completed in January of 2019. She had a fairly complicated course, but finally recovered.  She was found to have recurrent disease in June 2020.  PET scan was positive in the retroperitoneal adenopathy but not elsewhere.  She saw Dr. Cassell Clement at [facility] and she agreed that Kristina Torres was a candidate for autologous stem cell transplant.  We were unable to obtain a tissue confirmation as the only possible approach would be an open laparotomy.  Dr.  Noberto Retort has reviewed the case and feels she would need to go to a tertiary care center for this surgery.  We felt the morbidity of that surgery and the delay in starting her treatment was not worth the additional information.  Dr. Cassell Clement agreed.  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  Dr. Cassell Clement recommended two cycles of R-ICE followed by a PET scan.  The patient had her first cycle on June 23rd, and she did very well with no nausea, bowel problems, hematuria, or fever.  With her first cycle of R-ICE, her white count did go down to 0.6 and her platelets went as low as 6,000, so she did receive a plateletpheresis.  She had no fever or  infection.  She received platelet pheresis in July after her 2nd cycle of R-ICE.  She was seen on August 10th after 2 cycles of R-ICE.  Repeat PET scan, revealed a marked interval response to therapy of the intensely hypermetabolic retroperitoneal abdominal lymphadenopathy seen on the prior PET-CT, as well as revealed a stable size of a calcified precarinal lymph node that reveal low level hypermetabolism.  Bone marrow biopsy done the week prior showed hypercellularity with plenty of iron stores.  In preparation for stem cell collection, she underwent pegfilgrastim injections August 15th through the 17th and also received Mozobil, followed by stem cell collection.  She underwent stem cell transplant on September 3rd, 2020.  A Hickman catheter was placed while she was admitted for transplant, but she went into ventricular tachycardia, so this was subsequently removed.  After transplant, she was admitted to the hospital due to a low blood count and received platelets twice.  She discharged on the 12th day.  When we saw her in September, she was found to have an E coli urinary infection, which was treated with full dose Bactrim DS.  She was placed on Bactrim DS prophylaxis once daily on Mondays, Wednesdays, and Fridays on October 2nd for 3 months.  She continues valacyclovir 500 mg twice daily, as well as Bactrim 3 times weekly.  She was seen again at [facility] on October 29th.  She is on a double blinded clinical trial evaluating ibrutinib versus placebo posttransplant and takes 4  capsules daily.  She saw Dr. Cassell Clement on December 9th for her day 100.  Mammogram from January 16th 2021 was clear.  She was seen at [facility] by Dr. Cassell Clement on February 24th and PET imaging was clear.  At that time she underwent multiple vaccines and labs. CT chest, abdomen and pelvis from April 27th revealed no evidence of lymphoma recurrence, no lymphadenopathy, and a stable large hepatic hemangioma.    INTERVAL HISTORY:  Kristina Torres is  here for routine follow up and states that she has been doing well other than intermittent generalized muscle cramping. She continues to follow at [facility] with a good report, with scans in August, and will see them again in September 2023. She is up to date on her vaccines. Blood counts and chemistries are unremarkable. Her  appetite is good, and she has gained 4 pounds since her last visit.  She denies fever, chills or other signs of infection.  She denies nausea, vomiting, bowel issues, or abdominal pain.  She denies sore throat, cough, dyspnea, or chest pain.  REVIEW OF SYSTEMS:  Review of Systems  Constitutional: Negative.  Negative for appetite change, chills, fatigue, fever and unexpected weight change.  HENT:  Negative.    Eyes: Negative.   Respiratory: Negative.  Negative for chest tightness, cough, hemoptysis, shortness of breath and wheezing.   Cardiovascular: Negative.  Negative for chest pain, leg swelling and palpitations.  Gastrointestinal: Negative.  Negative for abdominal distention, abdominal pain, blood in stool, constipation, diarrhea, nausea and vomiting.  Endocrine: Negative.   Genitourinary: Negative.  Negative for difficulty urinating, dysuria, frequency and hematuria.   Musculoskeletal:  Negative for arthralgias, back pain, flank pain, gait problem and myalgias.       Generalized muscle cramping  Skin: Negative.   Neurological: Negative.  Negative for dizziness, extremity weakness, gait problem, headaches, light-headedness, numbness, seizures and speech difficulty.  Hematological: Negative.   Psychiatric/Behavioral: Negative.  Negative for depression and sleep disturbance. The patient is not nervous/anxious.     VITALS:  Blood pressure 139/69, pulse 69, temperature 97.9 F (36.6 C), temperature source Oral, resp. rate 18, height $RemoveBe'4\' 10"'yIdahyedu$  (1.473 m), weight 165 lb 1.6 oz (74.9 kg), SpO2 99 %.  Wt Readings from Last 3 Encounters:  11/02/21 165 lb 1.6 oz (74.9 kg)  11/02/20  161 lb 3.2 oz (73.1 kg)  12/30/18 152 lb 11.2 oz (69.3 kg)    Body mass index is 34.51 kg/m.  Performance status (ECOG): 0 - Asymptomatic  PHYSICAL EXAM:  Physical Exam Constitutional:      General: She is not in acute distress.    Appearance: Normal appearance. She is normal weight.  HENT:     Head: Normocephalic and atraumatic.  Eyes:     General: No scleral icterus.    Extraocular Movements: Extraocular movements intact.     Conjunctiva/sclera: Conjunctivae normal.     Pupils: Pupils are equal, round, and reactive to light.  Cardiovascular:     Rate and Rhythm: Normal rate and regular rhythm.     Pulses: Normal pulses.     Heart sounds: Normal heart sounds. No murmur heard.   No friction rub. No gallop.  Pulmonary:     Effort: Pulmonary effort is normal. No respiratory distress.     Breath sounds: Normal breath sounds.  Abdominal:     General: Bowel sounds are normal. There is no distension.     Palpations: Abdomen is soft. There is no hepatomegaly, splenomegaly or mass.  Tenderness: There is no abdominal tenderness.  Musculoskeletal:        General: Normal range of motion.     Cervical back: Normal range of motion and neck supple.     Right lower leg: No edema.     Left lower leg: No edema.     Left ankle: Swelling (with a healing scar in the medial aspect) present.  Lymphadenopathy:     Cervical: No cervical adenopathy.  Skin:    General: Skin is warm and dry.  Neurological:     General: No focal deficit present.     Mental Status: She is alert and oriented to person, place, and time. Mental status is at baseline.  Psychiatric:        Mood and Affect: Mood normal.        Behavior: Behavior normal.        Thought Content: Thought content normal.        Judgment: Judgment normal.   LABS:   CBC Latest Ref Rng & Units 05/02/2021 11/02/2020 12/30/2018  WBC - 4.4 5.7 -  Hemoglobin 12.0 - 16.0 12.7 14.0 13.6  Hematocrit 36 - 46 38 43 40.0  Platelets 150 - 399  204 186 -   CMP Latest Ref Rng & Units 05/02/2021 11/02/2020 12/30/2018  Glucose 70 - 99 mg/dL - - 757(B)  BUN 4 - 21 10 15  -  Creatinine 0.5 - 1.1 0.7 0.9 -  Sodium 137 - 147 138 140 142  Potassium 3.4 - 5.3 3.7 3.9 3.7  Chloride 99 - 108 104 104 -  CO2 13 - 22 25(A) 30(A) -  Calcium 8.7 - 10.7 9.0 9.8 -  Total Protein 6.5 - 8.1 g/dL - - -  Total Bilirubin 0.3 - 1.2 mg/dL - - -  Alkaline Phos 25 - 125 83 85 -  AST 13 - 35 15 20 -  ALT 7 - 35 11 13 -     Lab Results  Component Value Date   TOTALPROTELP 3.5 (L) 08/24/2017   Lab Results  Component Value Date   TIBC 214 (L) 08/21/2017   FERRITIN 745 (H) 08/21/2017   IRONPCTSAT 14 08/21/2017   Lab Results  Component Value Date   LDH 134 05/02/2021   LDH 136 11/02/2020   LDH 602 (H) 08/30/2017     STUDIES:  No results found.   CT CHEST ABDOMEN PELVIS W CONTRAST (ROUTINE), 08/03/2021  INDICATION: DLBCL in CR2 s/p auto SCT \ C83.33 Diffuse large B-cell lymphoma of intra-abdominal lymph nodes (HCC) \ Z94.84 H/O autologous stem cell transplant (HCC)  COMPARISON: CT chest abdomen pelvis 02/09/2021   TECHNIQUE: Multislice axial images were obtained through the chest, abdomen, and pelvis with administration of iodinated intravenous contrast material. Multi-planar reformatted images were generated for additional analysis. Nongated technique limits cardiac detail.   All CT scans at [facility] are performed using radiation dose optimization techniques as appropriate to a performed exam, including but not limited to one or more of the following: automatic exposure control, adjustment of the mA and/or kV according to patient size, use of iterative reconstruction technique. In addition, our institution participates in a radiation dose monitoring program to optimize patient radiation exposure.   FINDINGS:   CHEST:  .  Thoracic inlet/axilla: Right IJ port which terminates in the right  atrium. Subcentimeter hypodense lesion within the right thyroid lobe, no specific follow-up required per current ACR guidelines secondary to small size.  [facility]  Central airways: Within normal limits.  .  Mediastinum/hila: Within normal limits.  .  Heart/vessel: Normal heart size. Moderate coronary artery calcifications. No pericardial effusion. Aorta normal in caliber. Thoracic aortic calcifications. No central pulmonary embolism.  .  Lungs: Mild paraseptal emphysema. Atelectasis/fibrosis associated with prominent anteriorly directed osteophytes from the thoracic spine in the right lower lobe. Linear atelectasis in the left lower lobe and lingula..  .  Pleura: Within normal limits.   ABDOMEN:  .  Liver: Similar hypodensity 8.9 cm lesion within the right hepatic dome with coarse central calcifications and peripheral nodular enhancement, most consistent with a hemangioma and similar to prior.  .  Gallbladder/biliary: Cholelithiasis. No gallbladder wall thickening.  [facility]  Spleen: Within normal limits.  .  Pancreas: Within normal limits.  .  Adrenals: Within normal limits.  .  Kidneys: Atrophic right kidney. Symmetrically enhancing. No hydronephrosis. 3 cm left interpolar cyst.  .  Peritoneum: Within normal limits.  .  Mesentery: Within normal limits.  .  Extraperitoneum: Within normal limits.  .  GI tract: Small bowel loops are normal in caliber. Scattered colonic diverticulosis.  .  Vascular: Aortoiliac atherosclerotic calcifications without aneurysmal dilatation.   PELVIS:  .  Ureters: Within normal limits.  .  Bladder: Within normal limits.  .  Reproductive system: Calcified fibroids in the uterus.   MSK:  .  Degenerative changes of the thoracal spine, most pronounced in the lower lumbar spine. Grade 1 anterolisthesis of L4 on L5. Bilateral gluteal lipomatous. Anterior bridging osteophytes spanning multiple vertebral levels which can be seen with diffuse idiopathic skeletal hyperostosis (DISH).    CONCLUSION:   No pathologically enlarged lymph nodes to suggest recurrent disease within the chest, abdomen, or pelvis.  Allergies:  Allergies  Allergen Reactions   Midazolam Other (See Comments)    Cannot use versed per dr Melina Copa gi doctor Cannot use versed per dr butler gi doctor Other reaction(s): Other Cannot use versed per dr Melina Copa gi doctor Cannot use versed per dr butler gi doctor Cannot use versed per dr butler gi doctor   Streptomycin Anaphylaxis, Other (See Comments) and Swelling    Numb tongue, fingers and mouth Numb tongue, fingers and mouth  Other reaction(s): Other NUMBMESS OF MOUTH, TONGUE AND HANDS     Current Medications: Current Outpatient Medications  Medication Sig Dispense Refill   allopurinol (ZYLOPRIM) 300 MG tablet Take 300 mg by mouth every morning.      Cholecalciferol 25 MCG (1000 UT) tablet Take by mouth.     furosemide (LASIX) 20 MG tablet Take 20 mg by mouth as needed.      ondansetron (ZOFRAN) 4 MG tablet ondansetron HCl 4 mg tablet     ondansetron (ZOFRAN-ODT) 4 MG disintegrating tablet Take 4 mg by mouth every 8 (eight) hours as needed.     potassium chloride (KLOR-CON) 20 MEQ packet Take 20 mEq by mouth as needed.      traMADol (ULTRAM) 50 MG tablet Take by mouth.     No current facility-administered medications for this visit.     ASSESSMENT & PLAN:   Assessment:   1.  Recurrent diffuse large B-cell lymphoma, which had originally been diagnosed in September 2018, and treated with 6 cycles of R-CHOP, as well as intrathecal chemotherapy.  She then relapsed in June of 2020.  She received 2 cycles of R-ICE, which she tolerated well with an excellent response.  She was essentially in complete remission by PET scan, so underwent autologous stem cell transplant  in September 2020.  She completed her ibrutinib clinical trial in September 2021.  PET scan in February 2021 remained negative.  Her last scans in August 2022 looked good at [facility]  so we do consider her to be in remission.  Plan: She continues to follow with [facility] and will see them again in September 2023. CT imaging from August remains clear. She will return to clinic in 3 months for repeat CBC, CMP and port flush. She verbalizes understanding of and agreement to the plans discussed today. She knows to call the office should any new questions or concerns arise.    I provided 20 minutes of face-to-face time during this this encounter and > 50% was spent counseling as documented under my assessment and plan.    [name], MD [facility] [address] [place] [address] Dept: [phone] Dept Fax: [phone]   I, Rita Ohara, am acting as scribe for [name], MD  I have reviewed this report as typed by the medical scribe, and it is complete and accurate.

## 2021-11-02 ENCOUNTER — Encounter: Payer: Self-pay | Admitting: Oncology

## 2021-11-02 ENCOUNTER — Other Ambulatory Visit: Payer: Self-pay

## 2021-11-02 ENCOUNTER — Other Ambulatory Visit: Payer: Self-pay | Admitting: Oncology

## 2021-11-02 ENCOUNTER — Inpatient Hospital Stay: Payer: Medicare Other | Attending: Oncology

## 2021-11-02 ENCOUNTER — Inpatient Hospital Stay (INDEPENDENT_AMBULATORY_CARE_PROVIDER_SITE_OTHER): Payer: Medicare Other | Admitting: Oncology

## 2021-11-02 ENCOUNTER — Telehealth: Payer: Self-pay | Admitting: Oncology

## 2021-11-02 VITALS — BP 139/69 | HR 69 | Temp 97.9°F | Resp 18 | Ht <= 58 in | Wt 165.1 lb

## 2021-11-02 DIAGNOSIS — C8338 Diffuse large B-cell lymphoma, lymph nodes of multiple sites: Secondary | ICD-10-CM

## 2021-11-02 DIAGNOSIS — D649 Anemia, unspecified: Secondary | ICD-10-CM | POA: Diagnosis not present

## 2021-11-02 DIAGNOSIS — C8330 Diffuse large B-cell lymphoma, unspecified site: Secondary | ICD-10-CM | POA: Diagnosis not present

## 2021-11-02 LAB — BASIC METABOLIC PANEL
BUN: 13 (ref 4–21)
CO2: 25 — AB (ref 13–22)
Chloride: 107 (ref 99–108)
Creatinine: 0.9 (ref 0.5–1.1)
Glucose: 99
Potassium: 3.9 (ref 3.4–5.3)
Sodium: 139 (ref 137–147)

## 2021-11-02 LAB — CBC AND DIFFERENTIAL
HCT: 40 (ref 36–46)
Hemoglobin: 13.7 (ref 12.0–16.0)
Neutrophils Absolute: 3.64
Platelets: 206 (ref 150–399)
WBC: 7

## 2021-11-02 LAB — HEPATIC FUNCTION PANEL
ALT: 13 (ref 7–35)
AST: 19 (ref 13–35)
Alkaline Phosphatase: 84 (ref 25–125)
Bilirubin, Total: 0.8

## 2021-11-02 LAB — COMPREHENSIVE METABOLIC PANEL
Albumin: 4.1 (ref 3.5–5.0)
Calcium: 9.1 (ref 8.7–10.7)

## 2021-11-02 LAB — CBC: RBC: 4.14 (ref 3.87–5.11)

## 2021-11-02 NOTE — Telephone Encounter (Signed)
Per 11/16 los next appt scheduled and confirmed with patient

## 2021-11-08 ENCOUNTER — Encounter: Payer: Self-pay | Admitting: Oncology

## 2021-11-25 DIAGNOSIS — Z1231 Encounter for screening mammogram for malignant neoplasm of breast: Secondary | ICD-10-CM | POA: Diagnosis not present

## 2021-12-01 DIAGNOSIS — M069 Rheumatoid arthritis, unspecified: Secondary | ICD-10-CM | POA: Diagnosis not present

## 2021-12-01 DIAGNOSIS — Z6834 Body mass index (BMI) 34.0-34.9, adult: Secondary | ICD-10-CM | POA: Diagnosis not present

## 2021-12-01 DIAGNOSIS — E785 Hyperlipidemia, unspecified: Secondary | ICD-10-CM | POA: Diagnosis not present

## 2021-12-01 DIAGNOSIS — C8330 Diffuse large B-cell lymphoma, unspecified site: Secondary | ICD-10-CM | POA: Diagnosis not present

## 2021-12-01 DIAGNOSIS — Z1331 Encounter for screening for depression: Secondary | ICD-10-CM | POA: Diagnosis not present

## 2021-12-01 DIAGNOSIS — R32 Unspecified urinary incontinence: Secondary | ICD-10-CM | POA: Diagnosis not present

## 2021-12-01 DIAGNOSIS — Z0001 Encounter for general adult medical examination with abnormal findings: Secondary | ICD-10-CM | POA: Diagnosis not present

## 2021-12-01 DIAGNOSIS — Z1339 Encounter for screening examination for other mental health and behavioral disorders: Secondary | ICD-10-CM | POA: Diagnosis not present

## 2022-01-27 NOTE — Progress Notes (Signed)
[facility]  [address] [place],  [place]  [address] [phone]  Clinic Day:  02/02/2022  Referring physician: [name], MD   This document serves as a record of services personally performed by Hosie Poisson, MD. It was created on their behalf by Curry,Lauren E, a trained medical scribe. The creation of this record is based on the scribe's personal observations and the provider's statements to them.  CHIEF COMPLAINT:  CC: Stage IV high grade large B-cell lymphoma   Current Treatment:  Surveillance   HISTORY OF PRESENT ILLNESS:  Kristina Torres is a 76 y.o. female with a history of stage IV high-grade diffuse large B-cell lymphoma initially treated in 2018 with R-CHOP and intrathecal methotrexate, completed in January of 2019. She had a fairly complicated course, but finally recovered.  She was found to have recurrent disease in June 2020.  PET scan was positive in the retroperitoneal adenopathy but not elsewhere.  She saw Dr. Cassell Clement at [facility] and she agreed that Ruey was a candidate for autologous stem cell transplant.  We were unable to obtain a tissue confirmation as the only possible approach would be an open laparotomy.  Dr.  Noberto Retort has reviewed the case and feels she would need to go to a tertiary care center for this surgery.  We felt the morbidity of that surgery and the delay in starting her treatment was not worth the additional information.  Dr. Cassell Clement agreed.  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  Dr. Cassell Clement recommended two cycles of R-ICE followed by a PET scan.  The patient had her first cycle on June 23rd, and she did very well with no nausea, bowel problems, hematuria, or fever.  With her first cycle of R-ICE, her white count did go down to 0.6 and her platelets went as low as 6,000, so she did receive a plateletpheresis.  She had no fever or  infection.  She received platelet pheresis in July after her 2nd cycle of R-ICE.  She was seen on August 10th after 2 cycles of R-ICE.  Repeat PET scan, revealed a marked interval response to therapy of the intensely hypermetabolic retroperitoneal abdominal lymphadenopathy seen on the prior PET-CT, as well as revealed a stable size of a calcified precarinal lymph node that reveal low level hypermetabolism.  Bone marrow biopsy done the week prior showed hypercellularity with plenty of iron stores.  In preparation for stem cell collection, she underwent pegfilgrastim injections August 15th through the 17th and also received Mozobil, followed by stem cell collection.  She underwent stem cell transplant on September 3rd, 2020.  A Hickman catheter was placed while she was admitted for transplant, but she went into ventricular tachycardia, so this was subsequently removed.  After transplant, she was admitted to the hospital due to a low blood count and received platelets twice.  She discharged on the 12th day.  When we saw her in September, she was found to have an E coli urinary infection, which was treated with full dose Bactrim DS.  She was placed on Bactrim DS prophylaxis once daily on Mondays, Wednesdays, and Fridays on October 2nd for 3 months.  She continues valacyclovir 500 mg twice daily, as well as Bactrim 3 times weekly.  She was seen again at [facility] on October 29th.  She is on a double blinded clinical trial evaluating ibrutinib versus placebo posttransplant and takes  4 capsules daily.  She saw Dr. Cassell Clement on December 9th for her day 100.  Mammogram from January 16th 2021 was clear.  She was seen at [facility] by Dr. Cassell Clement on February 24th and PET imaging was clear.  At that time she underwent multiple vaccines and labs. CT abdomen and pelvis from August 2022 revealed no pathologically enlarged lymph nodes to suggest recurrent disease within the chest, abdomen, or pelvis. CT neck revealed suspected mild  enlargement of the right superior parathyroid gland.  INTERVAL HISTORY:  Kristina Torres is here for routine follow up and states that she has been well and denies complaints. Annual mammogram from December was clear. Blood counts and chemistries are unremarkable. Her  appetite is good, and she has lost 2 pounds since her last visit.  She denies fever, chills or other signs of infection.  She denies nausea, vomiting, bowel issues, or abdominal pain.  She denies sore throat, cough, dyspnea, or chest pain.  REVIEW OF SYSTEMS:  Review of Systems  Constitutional: Negative.  Negative for appetite change, chills, fatigue, fever and unexpected weight change.  HENT:  Negative.    Eyes: Negative.   Respiratory: Negative.  Negative for chest tightness, cough, hemoptysis, shortness of breath and wheezing.   Cardiovascular: Negative.  Negative for chest pain, leg swelling and palpitations.  Gastrointestinal: Negative.  Negative for abdominal distention, abdominal pain, blood in stool, constipation, diarrhea, nausea and vomiting.  Endocrine: Negative.   Genitourinary: Negative.  Negative for difficulty urinating, dysuria, frequency and hematuria.   Musculoskeletal: Negative.  Negative for arthralgias, back pain, flank pain, gait problem and myalgias.  Skin: Negative.   Neurological: Negative.  Negative for dizziness, extremity weakness, gait problem, headaches, light-headedness, numbness, seizures and speech difficulty.  Hematological: Negative.   Psychiatric/Behavioral: Negative.  Negative for depression and sleep disturbance. The patient is not nervous/anxious.     VITALS:  Blood pressure 123/65, pulse 67, temperature 98 F (36.7 C), temperature source Oral, resp. rate 16, height 4' 10" (1.473 m), weight 164 lb 14.4 oz (74.8 kg), SpO2 99 %.  Wt Readings from Last 3 Encounters:  02/02/22 164 lb 14.4 oz (74.8 kg)  11/02/21 165 lb 1.6 oz (74.9 kg)  11/02/20 161 lb 3.2 oz (73.1 kg)    Body mass index is 34.46  kg/m.  Performance status (ECOG): 0 - Asymptomatic  PHYSICAL EXAM:  Physical Exam Constitutional:      General: She is not in acute distress.    Appearance: Normal appearance. She is normal weight.  HENT:     Head: Normocephalic and atraumatic.  Eyes:     General: No scleral icterus.    Extraocular Movements: Extraocular movements intact.     Conjunctiva/sclera: Conjunctivae normal.     Pupils: Pupils are equal, round, and reactive to light.  Cardiovascular:     Rate and Rhythm: Normal rate and regular rhythm.     Pulses: Normal pulses.     Heart sounds: Normal heart sounds. No murmur heard.   No friction rub. No gallop.  Pulmonary:     Effort: Pulmonary effort is normal. No respiratory distress.     Breath sounds: Normal breath sounds.  Abdominal:     General: Bowel sounds are normal. There is no distension.     Palpations: Abdomen is soft. There is no hepatomegaly, splenomegaly or mass.     Tenderness: There is no abdominal tenderness.  Musculoskeletal:        General: Normal range of motion.  Cervical back: Normal range of motion and neck supple.     Right lower leg: No edema.     Left lower leg: Edema (mild) present.  Lymphadenopathy:     Cervical: No cervical adenopathy.  Skin:    General: Skin is warm and dry.  Neurological:     General: No focal deficit present.     Mental Status: She is alert and oriented to person, place, and time. Mental status is at baseline.  Psychiatric:        Mood and Affect: Mood normal.        Behavior: Behavior normal.        Thought Content: Thought content normal.        Judgment: Judgment normal.  Large scar of the left ankle LABS:   CBC Latest Ref Rng & Units 02/02/2022 11/02/2021 05/02/2021  WBC - 5.9 7.0 4.4  Hemoglobin 12.0 - 16.0 14.1 13.7 12.7  Hematocrit 36 - 46 40 40 38  Platelets 150 - 399 209 206 204   CMP Latest Ref Rng & Units 02/02/2022 11/02/2021 05/02/2021  Glucose 70 - 99 mg/dL - - -  BUN 4 - _0 Creatinine 0.5 - 1.1 0.8 0.9 0.7  Sodium 137 - 147 140 139 138  Potassium 3.4 - 5.3 4.2 3.9 3.7  Chloride 99 - 108 109(A) 107 104  CO2 13 - 22 25(A) 25(A) 25(A)  Calcium 8.7 - 10.7 9.3 9.1 9.0  Total Protein 6.5 - 8.1 g/dL - - -  Total Bilirubin 0.3 - 1.2 mg/dL - - -  Alkaline Phos 25 - 125 72 84 83  AST 13 - 35 _1 ALT 7 - 35 _2 Lab Results  Component Value Date   TOTALPROTELP 3.5 (L) 08/24/2017   Lab Results  Component Value Date   TIBC 214 (L) 08/21/2017   FERRITIN 745 (H) 08/21/2017   IRONPCTSAT 14 08/21/2017   Lab Results  Component Value Date   LDH 134 05/02/2021   LDH 136 11/02/2020   LDH 602 (H) 08/30/2017     STUDIES:  No results found.   EXAM:  DIGITAL SCREENING BILATERAL MAMMOGRAM WITH TOMOSYNTHESIS AND CAD   TECHNIQUE:  Bilateral screening digital craniocaudal and mediolateral oblique  mammograms were obtained. Bilateral screening digital breast  tomosynthesis was performed. The images were evaluated with  computer-aided detection.   COMPARISON: Previous exam(s).   ACR Breast Density Category b: There are scattered areas of  fibroglandular density.   FINDINGS:  There are no findings suspicious for malignancy.   IMPRESSION:  No mammographic evidence of malignancy.   Allergies:  Allergies  Allergen Reactions   Midazolam Other (See Comments)    Cannot use versed per dr Melina Copa gi doctor Cannot use versed per dr butler gi doctor Other reaction(s): Other Cannot use versed per dr Melina Copa gi doctor Cannot use versed per dr butler gi doctor Cannot use versed per dr butler gi doctor   Streptomycin Anaphylaxis, Other (See Comments) and Swelling    Numb tongue, fingers and mouth Numb tongue, fingers and mouth  Other reaction(s): Other NUMBMESS OF MOUTH, TONGUE AND HANDS     Current Medications: Current Outpatient Medications  Medication Sig Dispense Refill   allopurinol (ZYLOPRIM) 300 MG tablet Take 300 mg by mouth every  morning.      Cholecalciferol 25 MCG (1000 UT) tablet Take by mouth.     Current Facility-Administered Medications  Medication Dose  Route Frequency Provider Last Rate Last Admin   sodium chloride flush (NS) 0.9 % injection 10 mL  10 mL Intracatheter PRN [place] Scrape A, NP   10 mL at 02/02/22 1349     ASSESSMENT & PLAN:   Assessment:   1.  Recurrent diffuse large B-cell lymphoma, which had originally been diagnosed in September 2018, and treated with 6 cycles of R-CHOP, as well as intrathecal chemotherapy.  She then relapsed in June of 2020.  She received 2 cycles of R-ICE, which she tolerated well with an excellent response.  She was essentially in complete remission by PET scan, so underwent autologous stem cell transplant in September 2020.  She completed her ibrutinib clinical trial in September 2021.  PET scan in February 2021 remained negative.  Her last scans in August 2022 looked good at [facility] so we do consider her to be in remission.  2. Mild enlargement of the right parathyroid. We will add a TSH and PTH today.  Plan: Her port was flushed today and we will repeat this again in 3 months. We will plan to obtain CT neck, chest, abdomen and pelvis now. We will add TSH and PTH to her labs today. She continues to follow with [facility] and will see them again in August/September 2023 with scans. She will return to clinic in 3 months for repeat CBC, CMP and port flush. She verbalizes understanding of and agreement to the plans discussed today. She knows to call the office should any new questions or concerns arise.    I provided 15 minutes of face-to-face time during this this encounter and > 50% was spent counseling as documented under my assessment and plan.    [name], MD [facility] [address] [place] [address] Dept: [phone] Dept Fax: [phone]   I, Rita Ohara, am acting as scribe  for [name], MD  I have reviewed this report as typed by the medical scribe, and it is complete and accurate.

## 2022-02-02 ENCOUNTER — Telehealth: Payer: Self-pay | Admitting: Oncology

## 2022-02-02 ENCOUNTER — Other Ambulatory Visit: Payer: Self-pay | Admitting: Oncology

## 2022-02-02 ENCOUNTER — Inpatient Hospital Stay: Payer: Medicare Other | Attending: Oncology | Admitting: Oncology

## 2022-02-02 ENCOUNTER — Encounter: Payer: Self-pay | Admitting: Oncology

## 2022-02-02 ENCOUNTER — Inpatient Hospital Stay: Payer: Medicare Other

## 2022-02-02 ENCOUNTER — Other Ambulatory Visit: Payer: Self-pay

## 2022-02-02 VITALS — BP 123/65 | HR 67 | Temp 98.0°F | Resp 16 | Ht <= 58 in | Wt 164.9 lb

## 2022-02-02 DIAGNOSIS — C8338 Diffuse large B-cell lymphoma, lymph nodes of multiple sites: Secondary | ICD-10-CM

## 2022-02-02 DIAGNOSIS — Z8579 Personal history of other malignant neoplasms of lymphoid, hematopoietic and related tissues: Secondary | ICD-10-CM | POA: Diagnosis not present

## 2022-02-02 DIAGNOSIS — E785 Hyperlipidemia, unspecified: Secondary | ICD-10-CM | POA: Diagnosis not present

## 2022-02-02 DIAGNOSIS — R5382 Chronic fatigue, unspecified: Secondary | ICD-10-CM

## 2022-02-02 DIAGNOSIS — E215 Disorder of parathyroid gland, unspecified: Secondary | ICD-10-CM

## 2022-02-02 DIAGNOSIS — D649 Anemia, unspecified: Secondary | ICD-10-CM | POA: Diagnosis not present

## 2022-02-02 DIAGNOSIS — Z79899 Other long term (current) drug therapy: Secondary | ICD-10-CM | POA: Insufficient documentation

## 2022-02-02 LAB — CBC AND DIFFERENTIAL
HCT: 40 (ref 36–46)
Hemoglobin: 14.1 (ref 12.0–16.0)
Neutrophils Absolute: 3.01
Platelets: 209 (ref 150–399)
WBC: 5.9

## 2022-02-02 LAB — BASIC METABOLIC PANEL
BUN: 21 (ref 4–21)
CO2: 25 — AB (ref 13–22)
Chloride: 109 — AB (ref 99–108)
Creatinine: 0.8 (ref 0.5–1.1)
Glucose: 104
Potassium: 4.2 (ref 3.4–5.3)
Sodium: 140 (ref 137–147)

## 2022-02-02 LAB — CBC: RBC: 4.11 (ref 3.87–5.11)

## 2022-02-02 LAB — COMPREHENSIVE METABOLIC PANEL
Albumin: 4.2 (ref 3.5–5.0)
Calcium: 9.3 (ref 8.7–10.7)

## 2022-02-02 LAB — HEPATIC FUNCTION PANEL
ALT: 15 (ref 7–35)
AST: 27 (ref 13–35)
Alkaline Phosphatase: 72 (ref 25–125)
Bilirubin, Total: 1.1

## 2022-02-02 LAB — TSH: TSH: 1.301 u[IU]/mL (ref 0.350–4.500)

## 2022-02-02 MED ORDER — SODIUM CHLORIDE 0.9% FLUSH
10.0000 mL | INTRAVENOUS | Status: DC | PRN
  Administered 2022-02-02: 10 mL

## 2022-02-02 MED ORDER — HEPARIN SOD (PORK) LOCK FLUSH 100 UNIT/ML IV SOLN
500.0000 [IU] | Freq: Once | INTRAVENOUS | Status: AC | PRN
  Administered 2022-02-02: 500 [IU]

## 2022-02-02 NOTE — Telephone Encounter (Signed)
Per 02/02/22 los next appt and ct scans scheduled and confirmed with patient

## 2022-02-05 LAB — PTH-RELATED PEPTIDE: PTH-related peptide: <2 pmol/L

## 2022-02-07 DIAGNOSIS — I700 Atherosclerosis of aorta: Secondary | ICD-10-CM | POA: Diagnosis not present

## 2022-02-07 DIAGNOSIS — J439 Emphysema, unspecified: Secondary | ICD-10-CM | POA: Diagnosis not present

## 2022-02-07 DIAGNOSIS — C8590 Non-Hodgkin lymphoma, unspecified, unspecified site: Secondary | ICD-10-CM | POA: Diagnosis not present

## 2022-02-07 DIAGNOSIS — M47812 Spondylosis without myelopathy or radiculopathy, cervical region: Secondary | ICD-10-CM | POA: Diagnosis not present

## 2022-02-07 DIAGNOSIS — D1809 Hemangioma of other sites: Secondary | ICD-10-CM | POA: Diagnosis not present

## 2022-02-07 DIAGNOSIS — J432 Centrilobular emphysema: Secondary | ICD-10-CM | POA: Diagnosis not present

## 2022-02-07 DIAGNOSIS — J341 Cyst and mucocele of nose and nasal sinus: Secondary | ICD-10-CM | POA: Diagnosis not present

## 2022-02-07 DIAGNOSIS — K5730 Diverticulosis of large intestine without perforation or abscess without bleeding: Secondary | ICD-10-CM | POA: Diagnosis not present

## 2022-02-07 DIAGNOSIS — C8338 Diffuse large B-cell lymphoma, lymph nodes of multiple sites: Secondary | ICD-10-CM | POA: Diagnosis not present

## 2022-02-07 DIAGNOSIS — D259 Leiomyoma of uterus, unspecified: Secondary | ICD-10-CM | POA: Diagnosis not present

## 2022-02-08 ENCOUNTER — Encounter: Payer: Self-pay | Admitting: Oncology

## 2022-02-09 ENCOUNTER — Encounter: Payer: Self-pay | Admitting: Oncology

## 2022-02-10 ENCOUNTER — Encounter: Payer: Self-pay | Admitting: Oncology

## 2022-02-13 ENCOUNTER — Encounter: Payer: Self-pay | Admitting: Oncology

## 2022-03-14 DIAGNOSIS — Z20822 Contact with and (suspected) exposure to covid-19: Secondary | ICD-10-CM | POA: Diagnosis not present

## 2022-03-21 DIAGNOSIS — Z20822 Contact with and (suspected) exposure to covid-19: Secondary | ICD-10-CM | POA: Diagnosis not present

## 2022-04-26 DIAGNOSIS — Z20822 Contact with and (suspected) exposure to covid-19: Secondary | ICD-10-CM | POA: Diagnosis not present

## 2022-05-01 NOTE — Progress Notes (Addendum)
Kristina Torres  [address] [place],  [place]  [address] [phone]   Addendum: I will have her see her dermatologist to evaluate the 2 dark lesions of the right upper back.  Clinic Day:  05/02/2022  Referring physician: [name], MD  ASSESSMENT & PLAN:   Assessment & Plan: DLBCL (diffuse large B cell lymphoma) ([place]) Recurrent diffuse large B-cell lymphoma, which was originally diagnosed in September 2018, and treated with 6 cycles of R-CHOP, as well as intrathecal chemotherapy.  She then relapsed in June 2020.  She received 2 cycles of R-ICE, which she tolerated well with an excellent response.  She was essentially in complete remission by PET scan, so underwent autologous stem cell transplant in September 2020.  She completed her ibrutinib clinical trial in September 2021.  PET scan in February 2021 remained negative.  Her last scans in February did not reveal any evidence of recurrence.  She does not have clinical evidence of recurrence today.  She is due to follow-up at [facility] in September.  She states she is in her annual survivorship program now.    We will plan to see her back in 3 months with a CBC and comprehensive metabolic panel.  The patient understands the plans discussed today and is in agreement with them.  She knows to contact our office if she develops concerns prior to her next appointment.   I provided 20 minutes of face-to-face time during this encounter and > 50% was spent counseling as documented under my assessment and plan.    Marvia Pickles, PA-C  [facility] AT [facility] [address] [place] [address] Dept: [phone] Dept Fax: [phone]   Orders Placed This Encounter  Procedures  . CBC and differential    This external order was created through the Results Console.  . CBC    This external order was created through the Results Console.  . Basic  metabolic panel    This external order was created through the Results Console.  . Comprehensive metabolic panel    This external order was created through the Results Console.  . Hepatic function panel    This external order was created through the Results Console.  . CBC    This order was created through External Result Entry  . SCHEDULING COMMUNICATION INJECTION    Schedule 30 minute port flush appointment      CHIEF COMPLAINT:  CC: Stage IV high-grade diffuse large B-cell lymphoma  Current Treatment: Observation  HISTORY OF PRESENT ILLNESS:  Kristina Torres is a 76 y.o. female with a history of stage IV high-grade diffuse large B-cell lymphoma initially treated in 2018 with R-CHOP and intrathecal methotrexate, completed in January of 2019. She had a fairly complicated course, but finally recovered.  She was found to have recurrent disease in June 2020.  PET scan was positive in the retroperitoneal adenopathy but not elsewhere.  She saw Dr. Cassell Clement at [facility] and she agreed that Zuly was a candidate for autologous stem cell transplant.  We were unable to obtain a tissue confirmation as the only possible approach would be an open laparotomy.  Dr.  Noberto Retort has reviewed the case and feels she would need to go to a tertiary care center for this surgery.  We felt the morbidity of that surgery and the delay in starting her treatment was not worth the additional information.  Dr. Cassell Clement agreed.  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  Dr. Cassell Clement recommended two cycles of R-ICE followed by a PET scan.  The patient had her first cycle on June 23rd, and she did very well with no nausea, bowel problems, hematuria, or fever.  With her first cycle of R-ICE, her white count did go down to 0.6 and her platelets went as low as 6,000, so she did receive a plateletpheresis.  She had no fever or infection.  She received platelet  pheresis in July after her 2nd cycle of R-ICE.  She was seen on August 10th after 2 cycles of R-ICE.  Repeat PET scan, revealed a marked interval response to therapy of the intensely hypermetabolic retroperitoneal abdominal lymphadenopathy seen on the prior PET-CT, as well as revealed a stable size of a calcified precarinal lymph node that reveal low level hypermetabolism.  Bone marrow biopsy done the week prior showed hypercellularity with plenty of iron stores.  In preparation for stem cell collection, she underwent pegfilgrastim injections August 15th through the 17th and also received Mozobil, followed by stem cell collection.   She underwent stem cell transplant in September 2020.  A Hickman catheter was placed while she was admitted for transplant, but she went into ventricular tachycardia, so this was subsequently removed.  After transplant, she was admitted to the hospital due to a low blood count and received platelets twice.  When we saw her in September, she was found to have an E coli urinary infection, which was treated with full dose Bactrim DS.  She was placed on Bactrim DS prophylaxis once daily on Mondays, Wednesdays, and Fridays in October for 3 months.   She was seen again at [facility] in October.  She is on a double blinded clinical trial evaluating ibrutinib versus placebo posttransplant and takes 4 capsules daily.  She saw Dr. Cassell Clement in December for her day 100.  PET in February 2021 did not reveal any evidence of recurrent disease.  She has had continued routine imaging without evidence of recurrence.  Most recently, she had a CT neck, chest, abdomen and pelvis in February of this year at [facility] which did not reveal any evidence of recurrence.  Stable benign hepatic hemangioma was seen.  Colonic diverticulosis was seen.  Calcified uterine leiomyomas were seen.  She is up-to-date on screening mammogram with the last being in December 2022.  INTERVAL HISTORY:  Kristina Torres is here today  for repeat clinical assessment and states she has been doing well.  She reports occasional episodes of tachycardia when lying on her left side.  These resolve with changing position.  She denies shortness of breath, palpitations or chest pain.  She denies any palpable adenopathy.  She denies fevers, chills or night sweats. She denies pain. Her appetite is good. Her weight has increased 2 pounds over last 3 months .  REVIEW OF SYSTEMS:  Review of Systems  Constitutional:  Negative for appetite change, chills, fatigue, fever and unexpected weight change.  HENT:   Negative for lump/mass, mouth sores and sore throat.   Respiratory:  Negative for cough and shortness of breath.   Cardiovascular:  Negative for chest pain and leg swelling.  Gastrointestinal:  Negative for abdominal pain, constipation, diarrhea, nausea and vomiting.  Endocrine: Negative for hot flashes.  Genitourinary:  Negative for difficulty urinating, dysuria, frequency and hematuria.   Musculoskeletal:  Negative for arthralgias, back pain and myalgias.  Skin:  Negative for rash.  Neurological:  Negative for dizziness and headaches.  Hematological:  Negative for adenopathy. Does not bruise/bleed easily.  Psychiatric/Behavioral:  Negative for depression and sleep disturbance. The patient is not nervous/anxious.     VITALS:  Blood pressure 115/74, pulse 69, temperature 98.1 F (36.7 C), temperature source Oral, resp. rate 18, height 4' 10.2" (1.478 m), weight 166 lb 9.6 oz (75.6 kg), SpO2 95 %.  Wt Readings from Last 3 Encounters:  05/02/22 166 lb 9.6 oz (75.6 kg)  02/02/22 164 lb 14.4 oz (74.8 kg)  11/02/21 165 lb 1.6 oz (74.9 kg)    Body mass index is 34.58 kg/m.  Performance status (ECOG): 0 - Asymptomatic  PHYSICAL EXAM:  Physical Exam Vitals and nursing note reviewed.  Constitutional:      General: She is not in acute distress.    Appearance: Normal appearance.  HENT:     Head: Normocephalic and atraumatic.      Mouth/Throat:     Mouth: Mucous membranes are moist.     Pharynx: Oropharynx is clear. No oropharyngeal exudate or posterior oropharyngeal erythema.  Eyes:     General: No scleral icterus.    Extraocular Movements: Extraocular movements intact.     Conjunctiva/sclera: Conjunctivae normal.     Pupils: Pupils are equal, round, and reactive to light.  Cardiovascular:     Rate and Rhythm: Normal rate and regular rhythm.     Heart sounds: Normal heart sounds. No murmur heard.   No friction rub. No gallop.  Pulmonary:     Effort: Pulmonary effort is normal.     Breath sounds: Normal breath sounds. No wheezing, rhonchi or rales.  Abdominal:     General: There is no distension.     Palpations: Abdomen is soft. There is no hepatomegaly, splenomegaly or mass.     Tenderness: There is no abdominal tenderness.  Musculoskeletal:        General: Normal range of motion.     Cervical back: Normal range of motion and neck supple. No tenderness.     Right lower leg: No edema.     Left lower leg: No edema.  Lymphadenopathy:     Cervical: No cervical adenopathy.     Upper Body:     Right upper body: No supraclavicular or axillary adenopathy.     Left upper body: No supraclavicular or axillary adenopathy.     Lower Body: No right inguinal adenopathy. No left inguinal adenopathy.  Skin:    General: Skin is warm and dry.     Coloration: Skin is not jaundiced.     Findings: Lesion (2 pinpoint dark lesions of the right upper back) present. No rash.  Neurological:     Mental Status: She is alert and oriented to person, place, and time.     Cranial Nerves: No cranial nerve deficit.  Psychiatric:        Mood and Affect: Mood normal.        Behavior: Behavior normal.        Thought Content: Thought content normal.    LABS:      Latest Ref Rng & Units 05/02/2022   12:00 AM 02/02/2022   12:00 AM 11/02/2021   12:00 AM  CBC  WBC  6.1      5.9   7.0    Hemoglobin 12.0 - 16.0 14.7      14.1   13.7     Hematocrit 36 - 46 44      40   40  Platelets 150 - 400 K/uL 221      209   206       This result is from an external source.      Latest Ref Rng & Units 05/02/2022   12:00 AM 02/02/2022   12:00 AM 11/02/2021   12:00 AM  CMP  BUN 4 - $R'21 21      21   13    'kU$ Creatinine 0.5 - 1.1 0.9      0.8   0.9    Sodium 137 - 147 140      140   139    Potassium 3.5 - 5.1 mEq/L 4.4      4.2   3.9    Chloride 99 - 108 104      109   107    CO2 13 - $Re'22 25      25   25    'Ftm$ Calcium 8.7 - 10.7 9.6      9.3   9.1    Alkaline Phos 25 - 125 88      72   84    AST 13 - 35 $Re'22      27   19    'kkJ$ ALT 7 - 35 U/L $Remo'15      15   13       'mWlPC$ This result is from an external source.     No results found for: CEA1 / No results found for: CEA1 No results found for: PSA1 No results found for: ZJI967 No results found for: ELF810  Lab Results  Component Value Date   TOTALPROTELP 3.5 (L) 08/24/2017   Lab Results  Component Value Date   TIBC 214 (L) 08/21/2017   FERRITIN 745 (H) 08/21/2017   IRONPCTSAT 14 08/21/2017   Lab Results  Component Value Date   LDH 134 05/02/2021   LDH 136 11/02/2020   LDH 602 (H) 08/30/2017    STUDIES:  No results found.    HISTORY:   Past Medical History:  Diagnosis Date  . Acute urinary retention 08/29/2017  . AKI (acute kidney injury) ([place]) 08/20/2017  . Aortic atherosclerosis ([place])   . Bilateral pleural effusion   . Cataracts, bilateral   . Cytopenia   . DLBCL (diffuse large B cell lymphoma) ([place]) 09/03/2017   non hodkins lymphoma right kidney and ureter  . Essential hypertension    hx of off bp meds sept 2, 2018 due to weight loss  . Gout   . Grade I diastolic dysfunction   . Heart murmur    remote history  . History of blood transfusion 02/2018  . History of bronchitis   . History of chemotherapy finsished chemo Dec 19, 2017  . History of constipation   . HLD (hyperlipidemia)   . Hydronephrosis    Right secondary to bulky retroperitoneal lymphadenopathy  .  Hyperbilirubinemia 08/29/2017  . Hypercalcemia 08/20/2017  . Hypomagnesemia 08/29/2017  . Hypophosphatemia 08/29/2017  . Hypotension 08/20/2017  . Localized swelling of lower extremity    left lower leg keeps elevated  . Lymphadenopathy   . Macrocytic anemia 08/21/2017  . Nausea & vomiting 08/20/2017  . Neuromuscular disorder ([place])    neuropathy all over from chemo  . Obesity   . Port-A-Cath in place   . Rheumatoid arthritis ([place])   . Right renal mass 08/2017  . Rotator Torres tear    left  . Sepsis ([place]) 08/20/2017  . Splenomegaly   . Thrombocytopenia ([place])   .  Ureteral obstruction, right    Malignate    Past Surgical History:  Procedure Laterality Date  . APPENDECTOMY  age 20  . BONE MARROW BIOPSY  08/31/2017  . BREAST SURGERY Right 30 yrs ago   benign  . CARPAL TUNNEL RELEASE Bilateral   . CATARACT EXTRACTION, BILATERAL     02/2018: right 03/2018 left  . colonscopy    . CYSTOSCOPY W/ URETERAL STENT PLACEMENT Right 02/18/2018   Procedure: CYSTOSCOPY WITH RETROGRADE PYELOGRAM/URETERAL STENT EXCHANGE;  Surgeon: Kathie Rhodes, MD;  Location: [facility];  Service: Urology;  Laterality: Right;  . CYSTOSCOPY W/ URETERAL STENT PLACEMENT Right 08/02/2018   Procedure: CYSTOSCOPY WITH STENT REMOVAL AND REPLACEMENT;  Surgeon: Kathie Rhodes, MD;  Location: [facility];  Service: Urology;  Laterality: Right;  . CYSTOSCOPY W/ URETERAL STENT PLACEMENT Right 12/30/2018   Procedure: CYSTOSCOPY, RETROGRADE  WITH STENT REMOVAL;  Surgeon: Kathie Rhodes, MD;  Location: [facility];  Service: Urology;  Laterality: Right;  . IR FLUORO GUIDE PORT INSERTION RIGHT  09/06/2017  . IR NEPHROSTOMY EXCHANGE RIGHT  08/31/2017  . IR NEPHROSTOMY EXCHANGE RIGHT  09/11/2017  . IR NEPHROSTOMY PLACEMENT RIGHT  08/21/2017  . IR URETERAL STENT PLACEMENT EXISTING ACCESS RIGHT  11/19/2017  . IR US GUIDE VASC ACCESS RIGHT  09/06/2017  . LAPAROSCOPIC SALPINGO OOPHERECTOMY Right   . Left  supraclavicular lymph node biopsy  08/24/2017  . retroperitioneal lymph node biopsy   09/02/2017  . THORACENTESIS Left 09/20/2017    Family History  Problem Relation Age of Onset  . CVA Mother     Social History:  reports that she quit smoking about 4 years ago. Her smoking use included cigarettes. She has a 25.00 pack-year smoking history. She has never used smokeless tobacco. She reports current alcohol use. She reports that she does not use drugs.The patient is alone.  Her labs are unremarkable so there everything is fair we will do but will work on her do maybe I did I will now today.  Allergies:  Allergies  Allergen Reactions  . Midazolam Other (See Comments)    Cannot use versed per dr Melina Copa gi doctor Cannot use versed per dr butler gi doctor Other reaction(s): Other Cannot use versed per dr Melina Copa gi doctor Cannot use versed per dr Melina Copa gi doctor Cannot use versed per dr Melina Copa gi doctor  . Streptomycin Anaphylaxis, Other (See Comments) and Swelling    Numb tongue, fingers and mouth Numb tongue, fingers and mouth  Other reaction(s): Other NUMBMESS OF MOUTH, TONGUE AND HANDS     Current Medications: Current Outpatient Medications  Medication Sig Dispense Refill  . allopurinol (ZYLOPRIM) 300 MG tablet Take 300 mg by mouth every morning.     . Cholecalciferol 25 MCG (1000 UT) tablet Take by mouth.     Current Facility-Administered Medications  Medication Dose Route Frequency Provider Last Rate Last Admin  . sodium chloride flush (NS) 0.9 % injection 10 mL  10 mL Intracatheter PRN [place] Scrape A, NP   10 mL at 05/02/22 1110

## 2022-05-02 ENCOUNTER — Inpatient Hospital Stay: Payer: Medicare Other

## 2022-05-02 ENCOUNTER — Inpatient Hospital Stay: Payer: Medicare Other | Attending: Oncology | Admitting: Hematology and Oncology

## 2022-05-02 ENCOUNTER — Encounter: Payer: Self-pay | Admitting: Hematology and Oncology

## 2022-05-02 VITALS — BP 115/74 | HR 69 | Temp 98.1°F | Resp 18 | Ht 58.2 in | Wt 166.6 lb

## 2022-05-02 DIAGNOSIS — C8338 Diffuse large B-cell lymphoma, lymph nodes of multiple sites: Secondary | ICD-10-CM | POA: Diagnosis not present

## 2022-05-02 DIAGNOSIS — Z8572 Personal history of non-Hodgkin lymphomas: Secondary | ICD-10-CM | POA: Diagnosis not present

## 2022-05-02 LAB — BASIC METABOLIC PANEL
BUN: 21 (ref 4–21)
CO2: 25 — AB (ref 13–22)
Chloride: 104 (ref 99–108)
Creatinine: 0.9 (ref 0.5–1.1)
Glucose: 123
Potassium: 4.4 mEq/L (ref 3.5–5.1)
Sodium: 140 (ref 137–147)

## 2022-05-02 LAB — CBC
MCV: 97 (ref 81–99)
RBC: 4.52 (ref 3.87–5.11)

## 2022-05-02 LAB — HEPATIC FUNCTION PANEL
ALT: 15 U/L (ref 7–35)
AST: 22 (ref 13–35)
Alkaline Phosphatase: 88 (ref 25–125)
Bilirubin, Total: 1

## 2022-05-02 LAB — COMPREHENSIVE METABOLIC PANEL
Albumin: 4.6 (ref 3.5–5.0)
Calcium: 9.6 (ref 8.7–10.7)

## 2022-05-02 LAB — CBC AND DIFFERENTIAL
HCT: 44 (ref 36–46)
Hemoglobin: 14.7 (ref 12.0–16.0)
Neutrophils Absolute: 3.48
Platelets: 221 10*3/uL (ref 150–400)
WBC: 6.1

## 2022-05-02 MED ORDER — SODIUM CHLORIDE 0.9% FLUSH
10.0000 mL | INTRAVENOUS | Status: DC | PRN
  Administered 2022-05-02: 10 mL

## 2022-05-02 MED ORDER — HEPARIN SOD (PORK) LOCK FLUSH 100 UNIT/ML IV SOLN
500.0000 [IU] | Freq: Once | INTRAVENOUS | Status: AC | PRN
  Administered 2022-05-02: 500 [IU]

## 2022-05-02 NOTE — Assessment & Plan Note (Signed)
Recurrent diffuse large B-cell lymphoma, which was originally diagnosed in September 2018, and treated with 6 cycles of R-CHOP, as well as intrathecal chemotherapy.  She then relapsed in June 2020.  She received 2 cycles of R-ICE, which she tolerated well with an excellent response.  She was essentially in complete remission by PET scan, so underwent autologous stem cell transplant in September 2020.  She completed her ibrutinib clinical trial in September 2021.  PET scan in February 2021 remained negative.  Her last scans in February did not reveal any evidence of recurrence.  She does not have clinical evidence of recurrence today.  She is due to follow-up at [facility] in September.  She states she is in her annual survivorship program now. ?

## 2022-05-04 ENCOUNTER — Other Ambulatory Visit: Payer: Self-pay

## 2022-05-04 ENCOUNTER — Telehealth: Payer: Self-pay

## 2022-05-04 DIAGNOSIS — C8338 Diffuse large B-cell lymphoma, lymph nodes of multiple sites: Secondary | ICD-10-CM

## 2022-05-04 NOTE — Telephone Encounter (Signed)
Referral faxed to Dr Despina Pole / [facility] dermatology.

## 2022-05-04 NOTE — Telephone Encounter (Signed)
-----   Message from Marvia Pickles, PA-C sent at 05/03/2022 10:30 AM EDT ----- Please refer to dermatology re two small dark lesions right upper back.  She has been seen at [facility] in the past. Thanks!

## 2022-05-30 DIAGNOSIS — J069 Acute upper respiratory infection, unspecified: Secondary | ICD-10-CM | POA: Diagnosis not present

## 2022-05-30 DIAGNOSIS — R070 Pain in throat: Secondary | ICD-10-CM | POA: Diagnosis not present

## 2022-05-31 DIAGNOSIS — D485 Neoplasm of uncertain behavior of skin: Secondary | ICD-10-CM | POA: Diagnosis not present

## 2022-08-01 ENCOUNTER — Other Ambulatory Visit: Payer: Self-pay | Admitting: Oncology

## 2022-08-01 DIAGNOSIS — C8338 Diffuse large B-cell lymphoma, lymph nodes of multiple sites: Secondary | ICD-10-CM

## 2022-08-02 ENCOUNTER — Other Ambulatory Visit: Payer: Self-pay | Admitting: Oncology

## 2022-08-02 ENCOUNTER — Inpatient Hospital Stay: Payer: Medicare Other

## 2022-08-02 ENCOUNTER — Encounter: Payer: Self-pay | Admitting: Oncology

## 2022-08-02 ENCOUNTER — Inpatient Hospital Stay: Payer: Medicare Other | Attending: Oncology | Admitting: Oncology

## 2022-08-02 VITALS — BP 121/78 | HR 56 | Temp 98.0°F | Resp 18 | Ht 58.2 in | Wt 168.0 lb

## 2022-08-02 DIAGNOSIS — Z452 Encounter for adjustment and management of vascular access device: Secondary | ICD-10-CM | POA: Insufficient documentation

## 2022-08-02 DIAGNOSIS — C8338 Diffuse large B-cell lymphoma, lymph nodes of multiple sites: Secondary | ICD-10-CM

## 2022-08-02 DIAGNOSIS — Z8572 Personal history of non-Hodgkin lymphomas: Secondary | ICD-10-CM | POA: Insufficient documentation

## 2022-08-02 LAB — CBC: RBC: 4.25 (ref 3.87–5.11)

## 2022-08-02 LAB — BASIC METABOLIC PANEL
BUN: 12 (ref 4–21)
CO2: 27 — AB (ref 13–22)
Chloride: 108 (ref 99–108)
Creatinine: 0.9 (ref 0.5–1.1)
Glucose: 125
Potassium: 4.1 mEq/L (ref 3.5–5.1)
Sodium: 140 (ref 137–147)

## 2022-08-02 LAB — CBC AND DIFFERENTIAL
HCT: 42 (ref 36–46)
Hemoglobin: 14.1 (ref 12.0–16.0)
Neutrophils Absolute: 3.42
Platelets: 213 10*3/uL (ref 150–400)
WBC: 6.1

## 2022-08-02 LAB — COMPREHENSIVE METABOLIC PANEL
Albumin: 4.2 (ref 3.5–5.0)
Calcium: 9.4 (ref 8.7–10.7)

## 2022-08-02 LAB — HEPATIC FUNCTION PANEL
ALT: 15 U/L (ref 7–35)
AST: 20 (ref 13–35)
Alkaline Phosphatase: 79 (ref 25–125)
Bilirubin, Total: 0.9

## 2022-08-02 MED ORDER — SODIUM CHLORIDE 0.9% FLUSH
10.0000 mL | INTRAVENOUS | Status: DC | PRN
  Administered 2022-08-02: 10 mL

## 2022-08-02 MED ORDER — HEPARIN SOD (PORK) LOCK FLUSH 100 UNIT/ML IV SOLN
500.0000 [IU] | Freq: Once | INTRAVENOUS | Status: AC | PRN
  Administered 2022-08-02: 500 [IU]

## 2022-08-02 NOTE — Progress Notes (Signed)
[facility]  [address] [place],  [place]  [address] [phone]    Clinic Day:  08/02/22  Referring physician: [name], MD  ASSESSMENT & PLAN:   Assessment & Plan: DLBCL (diffuse large B cell lymphoma) ([place]) Recurrent diffuse large B-cell lymphoma, which was originally diagnosed in September 2018, and treated with 6 cycles of R-CHOP, as well as intrathecal chemotherapy.  She then relapsed in June 2020.  She received 2 cycles of R-ICE, which she tolerated well with an excellent response.  She was essentially in complete remission by PET scan, so underwent autologous stem cell transplant in September 2020, and is now 3 years out.  She completed her ibrutinib clinical trial in September 2021.  PET scan in February 2021 remained negative.  Her last scans in February did not reveal any evidence of recurrence.  She does not have clinical evidence of recurrence today.  She is due to follow-up at [facility] in September, which is annual now.     We will plan to see her back in 3 months with a CBC, comprehensive metabolic panel, and CT scans of chest, abdomen and pelvis.  The patient understands the plans discussed today and is in agreement with them.  She knows to contact our office if she develops concerns prior to her next appointment.   I provided 20 minutes of face-to-face time during this encounter and > 50% was spent counseling as documented under my assessment and plan.    [name], MD  [facility] AT [facility] [address] [place] [address] Dept: [phone] Dept Fax: [phone]   Orders Placed This Encounter  Procedures   CBC and differential    This external order was created through the Results Console.   CBC    This external order was created through the Results Console.   Basic metabolic panel    This external order was created through  the Results Console.   Comprehensive metabolic panel    This external order was created through the Results Console.   Hepatic function panel    This external order was created through the Results Console.      CHIEF COMPLAINT:  CC: Stage IV high-grade diffuse large B-cell lymphoma  Current Treatment: Observation  HISTORY OF PRESENT ILLNESS:  Kristina Torres is a 76 y.o. female with a history of stage IV high-grade diffuse large B-cell lymphoma initially treated in 2018 with R-CHOP and intrathecal methotrexate, completed in January of 2019. She had a fairly complicated course, but finally recovered.  She was found to have recurrent disease in June 2020.  PET scan was positive in the retroperitoneal adenopathy but not elsewhere.  She saw Dr. Cassell Clement at [facility] and she agreed that [place] was a candidate for autologous stem cell transplant.  We were unable to obtain a tissue confirmation as the only possible approach would be an open laparotomy.  Dr.  Noberto Retort has reviewed the case and feels she would need to go to a tertiary care center for this surgery.  We felt the morbidity of that surgery and the delay in starting her treatment was not worth the additional information.  Dr. Cassell Clement agreed.  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  Dr. Cassell Clement recommended two cycles of R-ICE followed by a PET scan.  The patient had her first cycle on  June 23rd, and she did very well with no nausea, bowel problems, hematuria, or fever.  With her first cycle of R-ICE, her white count did go down to 0.6 and her platelets went as low as 6,000, so she did receive a plateletpheresis.  She had no fever or infection.  She received platelet pheresis in July after her 2nd cycle of R-ICE.  She was seen on August 10th after 2 cycles of R-ICE.  Repeat PET scan, revealed a marked interval response to therapy of the intensely hypermetabolic retroperitoneal  abdominal lymphadenopathy seen on the prior PET-CT, as well as revealed a stable size of a calcified precarinal lymph node that reveal low level hypermetabolism.  Bone marrow biopsy done the week prior showed hypercellularity with plenty of iron stores.  In preparation for stem cell collection, she underwent pegfilgrastim injections August 15th through the 17th and also received Mozobil, followed by stem cell collection.   She underwent stem cell transplant in September 2020.  A Hickman catheter was placed while she was admitted for transplant, but she went into ventricular tachycardia, so this was subsequently removed.  After transplant, she was admitted to the hospital due to a low blood count and received platelets twice.  When we saw her in September, she was found to have an E coli urinary infection, which was treated with full dose Bactrim DS.  She was placed on Bactrim DS prophylaxis once daily on Mondays, Wednesdays, and Fridays in October for 3 months.   She was seen again at [facility] in October.  She is on a double blinded clinical trial evaluating ibrutinib versus placebo posttransplant and takes 4 capsules daily.  She saw Dr. Cassell Clement in December for her day 100.  PET in February 2021 did not reveal any evidence of recurrent disease.  She has had continued routine imaging without evidence of recurrence.  Most recently, she had a CT neck, chest, abdomen and pelvis in February of this year at [facility] which did not reveal any evidence of recurrence.  Stable benign hepatic hemangioma was seen.  Colonic diverticulosis was seen.  Calcified uterine leiomyomas were seen.  She is up-to-date on screening mammogram with the last being in December 2022.  INTERVAL HISTORY:  Kristina Torres is here today for repeat clinical assessment and states she has been doing well.  She remains on allopurinol and vitamin D. She denies shortness of breath, palpitations or chest pain.  She denies any palpable adenopathy.  She  denies fevers, chills or night sweats. She denies pain. Her appetite is good. Her weight has increased 2 pounds over last 3 months .  REVIEW OF SYSTEMS:  Review of Systems  Constitutional:  Negative for appetite change, chills, fatigue, fever and unexpected weight change.  HENT:   Negative for lump/mass, mouth sores and sore throat.   Respiratory:  Negative for cough and shortness of breath.   Cardiovascular:  Negative for chest pain and leg swelling.  Gastrointestinal:  Negative for abdominal pain, constipation, diarrhea, nausea and vomiting.  Endocrine: Negative for hot flashes.  Genitourinary:  Negative for difficulty urinating, dysuria, frequency and hematuria.   Musculoskeletal:  Negative for arthralgias, back pain and myalgias.  Skin:  Negative for rash.  Neurological:  Negative for dizziness and headaches.  Hematological:  Negative for adenopathy. Does not bruise/bleed easily.  Psychiatric/Behavioral:  Negative for depression and sleep disturbance. The patient is not nervous/anxious.      VITALS:  Blood pressure 121/78, pulse (!) 56, temperature 98 F (36.7 C),  temperature source Oral, resp. rate 18, height 4' 10.2" (1.478 m), weight 168 lb (76.2 kg), SpO2 95 %.  Wt Readings from Last 3 Encounters:  08/02/22 168 lb (76.2 kg)  05/02/22 166 lb 9.6 oz (75.6 kg)  02/02/22 164 lb 14.4 oz (74.8 kg)    Body mass index is 34.87 kg/m.  Performance status (ECOG): 0 - Asymptomatic  PHYSICAL EXAM:  Physical Exam Vitals and nursing note reviewed.  Constitutional:      General: She is not in acute distress.    Appearance: Normal appearance.  HENT:     Head: Normocephalic and atraumatic.     Mouth/Throat:     Mouth: Mucous membranes are moist.     Pharynx: Oropharynx is clear. No oropharyngeal exudate or posterior oropharyngeal erythema.  Eyes:     General: No scleral icterus.    Extraocular Movements: Extraocular movements intact.     Conjunctiva/sclera: Conjunctivae normal.      Pupils: Pupils are equal, round, and reactive to light.  Cardiovascular:     Rate and Rhythm: Normal rate and regular rhythm.     Heart sounds: Normal heart sounds. No murmur heard.    No friction rub. No gallop.  Pulmonary:     Effort: Pulmonary effort is normal.     Breath sounds: Normal breath sounds. No wheezing, rhonchi or rales.  Abdominal:     General: There is no distension.     Palpations: Abdomen is soft. There is no hepatomegaly, splenomegaly or mass.     Tenderness: There is no abdominal tenderness.  Musculoskeletal:        General: Normal range of motion.     Cervical back: Normal range of motion and neck supple. No tenderness.     Right lower leg: No edema.     Left lower leg: No edema.  Lymphadenopathy:     Cervical: No cervical adenopathy.     Upper Body:     Right upper body: No supraclavicular or axillary adenopathy.     Left upper body: No supraclavicular or axillary adenopathy.     Lower Body: No right inguinal adenopathy. No left inguinal adenopathy.  Skin:    General: Skin is warm and dry.     Coloration: Skin is not jaundiced.     Findings: No lesion (2 pinpoint dark lesions of the right upper back) or rash.  Neurological:     Mental Status: She is alert and oriented to person, place, and time.     Cranial Nerves: No cranial nerve deficit.  Psychiatric:        Mood and Affect: Mood normal.        Behavior: Behavior normal.        Thought Content: Thought content normal.    LABS:      Latest Ref Rng & Units 08/02/2022   12:00 AM 05/02/2022   12:00 AM 02/02/2022   12:00 AM  CBC  WBC  6.1     6.1     5.9   Hemoglobin 12.0 - 16.0 14.1     14.7     14.1   Hematocrit 36 - 46 42     44     40   Platelets 150 - 400 K/uL 213     221     209      This result is from an external source.      Latest Ref Rng & Units 08/02/2022   12:00 AM 05/02/2022   12:00 AM  02/02/2022   12:00 AM  CMP  BUN 4 - _0 Creatinine 0.5 - 1.1 0.9     0.9      0.8   Sodium 137 - 147 140     140     140   Potassium 3.5 - 5.1 mEq/L 4.1     4.4     4.2   Chloride 99 - 108 108     104     109   CO2 13 - _1 Calcium 8.7 - 10.7 9.4     9.6     9.3   Alkaline Phos 25 - 125 79     88     72   AST 13 - 35 _2 ALT 7 - 35 U/L _3 This result is from an external source.     No results found for: "CEA1", "CEA" / No results found for: "CEA1", "CEA" No results found for: "PSA1" No results found for: "ZYY482" No results found for: "CAN125"  Lab Results  Component Value Date   TOTALPROTELP 3.5 (L) 08/24/2017   Lab Results  Component Value Date   TIBC 214 (L) 08/21/2017   FERRITIN 745 (H) 08/21/2017   IRONPCTSAT 14 08/21/2017   Lab Results  Component Value Date   LDH 134 05/02/2021   LDH 136 11/02/2020   LDH 602 (H) 08/30/2017    STUDIES:  No results found.    HISTORY:   Past Medical History:  Diagnosis Date   Acute urinary retention 08/29/2017   AKI (acute kidney injury) ([place]) 08/20/2017   Aortic atherosclerosis ([facility])    Bilateral pleural effusion    Cataracts, bilateral    Cytopenia    DLBCL (diffuse large B cell lymphoma) ([place]) 09/03/2017   non hodkins lymphoma right kidney and ureter   Essential hypertension    hx of off bp meds sept 2, 2018 due to weight loss   Gout    Grade I diastolic dysfunction    Heart murmur    remote history   History of blood transfusion 02/2018   History of bronchitis    History of chemotherapy finsished chemo Dec 19, 2017   History of constipation    HLD (hyperlipidemia)    Hydronephrosis    Right secondary to bulky retroperitoneal lymphadenopathy   Hyperbilirubinemia 08/29/2017   Hypercalcemia 08/20/2017   Hypomagnesemia 08/29/2017   Hypophosphatemia 08/29/2017   Hypotension 08/20/2017   Localized swelling of lower extremity    left lower leg keeps elevated   Lymphadenopathy    Macrocytic anemia 08/21/2017   Nausea & vomiting 08/20/2017    Neuromuscular disorder (H[facility])    neuropathy all over from chemo   Obesity    Port-A-Cath in place    Rheumatoid arthritis ([place])    Right renal mass 08/2017   Rotator cuff tear    left   Sepsis ([place]) 08/20/2017   Splenomegaly    Thrombocytopenia ([facility])    Ureteral obstruction, right    Malignate    Past Surgical History:  Procedure Laterality Date   APPENDECTOMY  age 16   BONE MARROW BIOPSY  08/31/2017   BREAST SURGERY Right 30 yrs ago   benign   CARPAL  TUNNEL RELEASE Bilateral    CATARACT EXTRACTION, BILATERAL     02/2018: right 03/2018 left   colonscopy     CYSTOSCOPY W/ URETERAL STENT PLACEMENT Right 02/18/2018   Procedure: CYSTOSCOPY WITH RETROGRADE PYELOGRAM/URETERAL STENT EXCHANGE;  Surgeon: Kathie Rhodes, MD;  Location: [facility];  Service: Urology;  Laterality: Right;   CYSTOSCOPY W/ URETERAL STENT PLACEMENT Right 08/02/2018   Procedure: CYSTOSCOPY WITH STENT REMOVAL AND REPLACEMENT;  Surgeon: Kathie Rhodes, MD;  Location: [facility];  Service: Urology;  Laterality: Right;   CYSTOSCOPY W/ URETERAL STENT PLACEMENT Right 12/30/2018   Procedure: CYSTOSCOPY, RETROGRADE  WITH STENT REMOVAL;  Surgeon: Kathie Rhodes, MD;  Location: [facility];  Service: Urology;  Laterality: Right;   IR FLUORO GUIDE PORT INSERTION RIGHT  09/06/2017   IR NEPHROSTOMY EXCHANGE RIGHT  08/31/2017   IR NEPHROSTOMY EXCHANGE RIGHT  09/11/2017   IR NEPHROSTOMY PLACEMENT RIGHT  08/21/2017   IR URETERAL STENT PLACEMENT EXISTING ACCESS RIGHT  11/19/2017   IR US GUIDE VASC ACCESS RIGHT  09/06/2017   LAPAROSCOPIC SALPINGO OOPHERECTOMY Right    Left supraclavicular lymph node biopsy  08/24/2017   retroperitioneal lymph node biopsy   09/02/2017   THORACENTESIS Left 09/20/2017    Family History  Problem Relation Age of Onset   CVA Mother     Social History:  reports that she quit smoking about 5 years ago. Her smoking use included cigarettes. She has a 25.00 pack-year  smoking history. She has never used smokeless tobacco. She reports current alcohol use. She reports that she does not use drugs.The patient is alone.  Her labs are unremarkable so there everything is fair we will do but will work on her do maybe I did I will now today.  Allergies:  Allergies  Allergen Reactions   Midazolam Other (See Comments)    Cannot use versed per dr Melina Copa gi doctor Cannot use versed per dr butler gi doctor Other reaction(s): Other Cannot use versed per dr Melina Copa gi doctor Cannot use versed per dr butler gi doctor Cannot use versed per dr butler gi doctor   Streptomycin Anaphylaxis, Other (See Comments) and Swelling    Numb tongue, fingers and mouth Numb tongue, fingers and mouth  Other reaction(s): Other NUMBMESS OF MOUTH, TONGUE AND HANDS  Other reaction(s): facial swelling    Current Medications: Current Outpatient Medications  Medication Sig Dispense Refill   allopurinol (ZYLOPRIM) 300 MG tablet Take 300 mg by mouth every morning.      Cholecalciferol 25 MCG (1000 UT) tablet Take by mouth.     Current Facility-Administered Medications  Medication Dose Route Frequency Provider Last Rate Last Admin   sodium chloride flush (NS) 0.9 % injection 10 mL  10 mL Intracatheter PRN [place] Scrape A, NP   10 mL at 08/02/22 1125

## 2022-08-08 ENCOUNTER — Telehealth: Payer: Self-pay

## 2022-08-08 NOTE — Telephone Encounter (Signed)
-----   Message from [name], MD sent at 08/02/2022 12:42 PM EDT ----- Regarding: call Tell her labs all look good, incl BS 125

## 2022-08-11 ENCOUNTER — Encounter: Payer: Self-pay | Admitting: Oncology

## 2022-08-25 DIAGNOSIS — Z8572 Personal history of non-Hodgkin lymphomas: Secondary | ICD-10-CM | POA: Diagnosis not present

## 2022-08-25 DIAGNOSIS — Z08 Encounter for follow-up examination after completed treatment for malignant neoplasm: Secondary | ICD-10-CM | POA: Diagnosis not present

## 2022-08-25 DIAGNOSIS — Z006 Encounter for examination for normal comparison and control in clinical research program: Secondary | ICD-10-CM | POA: Diagnosis not present

## 2022-08-25 DIAGNOSIS — Z9484 Stem cells transplant status: Secondary | ICD-10-CM | POA: Diagnosis not present

## 2022-08-25 DIAGNOSIS — Z881 Allergy status to other antibiotic agents status: Secondary | ICD-10-CM | POA: Diagnosis not present

## 2022-08-25 DIAGNOSIS — C8333 Diffuse large B-cell lymphoma, intra-abdominal lymph nodes: Secondary | ICD-10-CM | POA: Diagnosis not present

## 2022-08-25 DIAGNOSIS — Z884 Allergy status to anesthetic agent status: Secondary | ICD-10-CM | POA: Diagnosis not present

## 2022-08-27 ENCOUNTER — Encounter: Payer: Self-pay | Admitting: Oncology

## 2022-09-26 DIAGNOSIS — Z23 Encounter for immunization: Secondary | ICD-10-CM | POA: Diagnosis not present

## 2022-10-31 DIAGNOSIS — H43393 Other vitreous opacities, bilateral: Secondary | ICD-10-CM | POA: Diagnosis not present

## 2022-10-31 DIAGNOSIS — H524 Presbyopia: Secondary | ICD-10-CM | POA: Diagnosis not present

## 2022-10-31 DIAGNOSIS — H43813 Vitreous degeneration, bilateral: Secondary | ICD-10-CM | POA: Diagnosis not present

## 2022-10-31 DIAGNOSIS — G43109 Migraine with aura, not intractable, without status migrainosus: Secondary | ICD-10-CM | POA: Diagnosis not present

## 2022-10-31 DIAGNOSIS — H35372 Puckering of macula, left eye: Secondary | ICD-10-CM | POA: Diagnosis not present

## 2022-11-01 DIAGNOSIS — N281 Cyst of kidney, acquired: Secondary | ICD-10-CM | POA: Diagnosis not present

## 2022-11-01 DIAGNOSIS — I700 Atherosclerosis of aorta: Secondary | ICD-10-CM | POA: Diagnosis not present

## 2022-11-01 DIAGNOSIS — C8510 Unspecified B-cell lymphoma, unspecified site: Secondary | ICD-10-CM | POA: Diagnosis not present

## 2022-11-01 DIAGNOSIS — C8338 Diffuse large B-cell lymphoma, lymph nodes of multiple sites: Secondary | ICD-10-CM | POA: Diagnosis not present

## 2022-11-01 DIAGNOSIS — D171 Benign lipomatous neoplasm of skin and subcutaneous tissue of trunk: Secondary | ICD-10-CM | POA: Diagnosis not present

## 2022-11-01 DIAGNOSIS — J439 Emphysema, unspecified: Secondary | ICD-10-CM | POA: Diagnosis not present

## 2022-11-01 DIAGNOSIS — K5730 Diverticulosis of large intestine without perforation or abscess without bleeding: Secondary | ICD-10-CM | POA: Diagnosis not present

## 2022-11-01 DIAGNOSIS — D259 Leiomyoma of uterus, unspecified: Secondary | ICD-10-CM | POA: Diagnosis not present

## 2022-11-01 LAB — HEPATIC FUNCTION PANEL
ALT: 16 U/L (ref 7–35)
AST: 23 (ref 13–35)
Alkaline Phosphatase: 79 (ref 25–125)
Bilirubin, Total: 0.7

## 2022-11-01 LAB — CBC: RBC: 4.11 (ref 3.87–5.11)

## 2022-11-01 LAB — BASIC METABOLIC PANEL
BUN: 12 (ref 4–21)
CO2: 23 — AB (ref 13–22)
Chloride: 109 — AB (ref 99–108)
Creatinine: 0.8 (ref 0.5–1.1)
Glucose: 130
Potassium: 3.8 mEq/L (ref 3.5–5.1)
Sodium: 139 (ref 137–147)

## 2022-11-01 LAB — CBC AND DIFFERENTIAL
Absolute Lymphocytes: 2.03 (ref 0.65–4.75)
HCT: 40 (ref 36–46)
Hemoglobin: 13.7 (ref 12.0–16.0)
MCV: 98 (ref 81–99)
Neutrophils Absolute: 3.13
Platelets: 220 10*3/uL (ref 150–400)
WBC: 5.8

## 2022-11-01 LAB — COMPREHENSIVE METABOLIC PANEL
Albumin: 4 (ref 3.5–5.0)
Calcium: 9 (ref 8.7–10.7)

## 2022-11-01 NOTE — Assessment & Plan Note (Signed)
Recurrent diffuse large B-cell lymphoma.  She was originally diagnosed in September 2018, and treated with 6 cycles of R-CHOP, as well as intrathecal chemotherapy.  She then relapsed in June 2020.  She received 2 cycles of R-ICE, which she tolerated well with an excellent response.  She was essentially in complete remission by PET scan, so underwent autologous stem cell transplant in September 2020.  She completed an ibrutinib clinical trial in September 2021.  PET scan in February 2021 remained negative.  She continues to follow-up at [facility] clinic annually.  Most recent CT scans on November 15th did not reveal any evidence of recurrence.  We will plan to see her back in 3 months for repeat clinical assessment.

## 2022-11-01 NOTE — Progress Notes (Signed)
[name]  [address] [place],  [place]  [address] [phone]  Clinic Day:  11/02/2022  Referring physician: [name], MD  ASSESSMENT & PLAN:   Assessment & Plan: DLBCL (diffuse large B cell lymphoma) ([place]) Recurrent diffuse large B-cell lymphoma.  She was originally diagnosed in September 2018, and treated with 6 cycles of R-CHOP, as well as intrathecal chemotherapy.  She then relapsed in June 2020.  She received 2 cycles of R-ICE, which she tolerated well with an excellent response.  She was essentially in complete remission by PET scan, so underwent autologous stem cell transplant in September 2020.  She completed an ibrutinib clinical trial in September 2021.  PET scan in February 2021 remained negative.  She continues to follow-up at [facility] annually.  Most recent CT scans on November 15th did not reveal any evidence of recurrence.  We will plan to see her back in 3 months for repeat clinical assessment.   The patient understands the plans discussed today and is in agreement with them.  She knows to contact our office if she develops concerns prior to her next appointment.    Marvia Pickles, PA-C  [facility] [address] [place] [address] Dept: [phone] Dept Fax: [phone]   Orders Placed This Encounter  Procedures   CBC and differential    This external order was created through the Results Console.   CBC    This external order was created through the Results Console.   Basic metabolic panel    This external order was created through the Results Console.   Comprehensive metabolic panel    This external order was created through the Results Console.   Hepatic function panel    This external order was created through the Results Console.      CHIEF COMPLAINT:  CC: Recurrent diffuse large B-cell lymphoma  Current  Treatment:  Observation  HISTORY OF PRESENT ILLNESS:  Kristina Torres is a 76 y.o. female with a history of stage IV high-grade diffuse large B-cell lymphoma initially treated in 2018 with R-CHOP and intrathecal methotrexate, completed in January of 2019. She had a fairly complicated course, but finally recovered.  She was found to have recurrent disease in June 2020.  PET scan was positive in the retroperitoneal adenopathy but not elsewhere.  She saw Dr. Cassell Clement at [facility] and she agreed that Melitza was a candidate for autologous stem cell transplant.  We were unable to obtain a tissue confirmation as the only possible approach would be an open laparotomy.  Dr.  Noberto Retort has reviewed the case and feels she would need to go to a tertiary care center for this surgery.  We felt the morbidity of that surgery and the delay in starting her treatment was not worth the additional information.  Dr. Cassell Clement agreed.  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  Dr. Cassell Clement recommended two cycles of R-ICE followed by a PET scan.  The patient had her first cycle on June 23rd, and she did very well with no nausea, bowel problems, hematuria, or fever.  With her first cycle of R-ICE, her white count did go down to 0.6 and her platelets went as low as 6,000, so she did receive a plateletpheresis.  She had no fever or infection.  he received platelet pheresis in July after  her 2nd cycle of R-ICE.  After 2 cycles of R-ICE, repeat PET scan, revealed a marked interval response to therapy of the intensely hypermetabolic retroperitoneal abdominal lymphadenopathy seen on the prior PET-CT, as well as revealed a stable size of a calcified precarinal lymph node that reveal low level hypermetabolism.  Bone marrow biopsy done the week prior showed hypercellularity with plenty of iron stores.  In preparation for stem cell collection, she received pegfilgrastim  injections, as well as Mozobil, followed by stem cell collection.   She underwent stem cell transplant in September 2020.  A Hickman catheter was placed while she was admitted for transplant, but she went into ventricular tachycardia, so this was subsequently removed.  After transplant, she was admitted to the hospital due to a low blood count and received platelets twice.  When we saw her in September, she was found to have an E coli urinary infection, which was treated with full dose Bactrim DS.  She was placed on Bactrim DS prophylaxis once daily on Mondays, Wednesdays, and Fridays in October for 3 months.   She was seen again at [facility] in October.  She is on a double blinded clinical trial evaluating ibrutinib versus placebo posttransplant and takes 4 capsules daily.  She saw Dr. Cassell Clement in December for her day 100.  PET in February 2021 did not reveal any evidence of recurrent disease.  She has had continued routine imaging without evidence of recurrence.  Most recently, she had a CT neck, chest, abdomen and pelvis in February of this year at [facility] which did not reveal any evidence of recurrence.  Stable benign hepatic hemangioma was seen.  Colonic diverticulosis was seen.  Calcified uterine leiomyomas were seen.  She is up-to-date on screening mammogram with the last being in December 2022.   Oncology History  DLBCL (diffuse large B cell lymphoma) ([place])  09/03/2017 Initial Diagnosis   DLBCL (diffuse large B cell lymphoma) ([place])   09/04/2017 Cancer Staging   Staging form: Hodgkin and Non-Hodgkin Lymphoma, AJCC 8th Edition - Clinical stage from 09/04/2017: Stage IV (Diffuse large B-cell lymphoma) - Signed by [name], MD on 11/08/2021 Histopathologic type: Malignant lymphoma, large B-cell, diffuse, immunoblastic, NOS Diagnostic confirmation: Positive histology PLUS positive immunophenotyping and/or positive genetic studies Specimen type: Core Needle Biopsy Staged by: Managing  physician Stage used in treatment planning: Yes National guidelines used in treatment planning: Yes Type of national guideline used in treatment planning: NCCN       INTERVAL HISTORY:  Gissell is here today for repeat clinical assessment.  She reports diarrhea mainly when eating dairy. She has occasional swelling of the left foot and ankle since surgical repair of a fracture. Dr. Laqueta Due has given her furosemide to use as needed and potassium to take with the furosemide.  She is using that infrequently. She reports intermittent generalized muscle cramping.  She states she has had her magnesium level checked recently. She states she is drinking plenty of fluid.  She denies fevers, chills or night sweats. She denies pain. Her appetite is good. Her weight has increased 5 pounds over last 3 months . She is scheduled for screening mammogram in December. She states she does not require further colonoscopy.  REVIEW OF SYSTEMS:  Review of Systems  Constitutional:  Negative for appetite change, chills, fatigue, fever and unexpected weight change.  HENT:   Negative for lump/mass, mouth sores and sore throat.   Respiratory:  Negative for cough and shortness of breath.   Cardiovascular:  Negative for chest pain and leg swelling.  Gastrointestinal:  Positive for diarrhea. Negative for abdominal pain, constipation, nausea and vomiting.  Endocrine: Negative for hot flashes.  Genitourinary:  Negative for difficulty urinating, dysuria, frequency and hematuria.   Musculoskeletal:  Negative for arthralgias, back pain and myalgias.  Skin:  Negative for rash.  Neurological:  Negative for dizziness and headaches.  Hematological:  Negative for adenopathy. Does not bruise/bleed easily.  Psychiatric/Behavioral:  Negative for depression and sleep disturbance. The patient is not nervous/anxious.      VITALS:  Blood pressure (!) 167/82, pulse 62, temperature 97.8 F (36.6 C), temperature source Oral, resp. rate 20,  height 4' 10.2" (1.478 m), weight 173 lb 1.6 oz (78.5 kg), SpO2 97 %.  Wt Readings from Last 3 Encounters:  11/02/22 173 lb 1.6 oz (78.5 kg)  08/02/22 168 lb (76.2 kg)  05/02/22 166 lb 9.6 oz (75.6 kg)    Body mass index is 35.93 kg/m.  Performance status (ECOG): 1 - Symptomatic but completely ambulatory  PHYSICAL EXAM:  Physical Exam Vitals and nursing note reviewed.  Constitutional:      General: She is not in acute distress.    Appearance: Normal appearance.  HENT:     Head: Normocephalic and atraumatic.     Mouth/Throat:     Mouth: Mucous membranes are moist.     Pharynx: Oropharynx is clear. No oropharyngeal exudate or posterior oropharyngeal erythema.  Eyes:     General: No scleral icterus.    Extraocular Movements: Extraocular movements intact.     Conjunctiva/sclera: Conjunctivae normal.     Pupils: Pupils are equal, round, and reactive to light.  Cardiovascular:     Rate and Rhythm: Normal rate and regular rhythm.     Heart sounds: Normal heart sounds. No murmur heard.    No friction rub. No gallop.  Pulmonary:     Effort: Pulmonary effort is normal.     Breath sounds: Normal breath sounds. No wheezing, rhonchi or rales.  Abdominal:     General: There is no distension.     Palpations: Abdomen is soft. There is no hepatomegaly, splenomegaly or mass.     Tenderness: There is no abdominal tenderness.  Musculoskeletal:        General: Normal range of motion.     Cervical back: Normal range of motion and neck supple. No tenderness.     Right lower leg: No edema.     Left lower leg: No edema.  Lymphadenopathy:     Cervical: No cervical adenopathy.     Upper Body:     Right upper body: No supraclavicular or axillary adenopathy.     Left upper body: No supraclavicular or axillary adenopathy.     Lower Body: No right inguinal adenopathy. No left inguinal adenopathy.  Skin:    General: Skin is warm and dry.     Coloration: Skin is not jaundiced.     Findings: No  rash.  Neurological:     Mental Status: She is alert and oriented to person, place, and time.     Cranial Nerves: No cranial nerve deficit.  Psychiatric:        Mood and Affect: Mood normal.        Behavior: Behavior normal.        Thought Content: Thought content normal.     LABS:      Latest Ref Rng & Units 11/01/2022   12:00 AM 08/02/2022   12:00 AM 05/02/2022   12:00  AM  CBC  WBC  5.8     6.1     6.1      Hemoglobin 12.0 - 16.0 13.7     14.1     14.7      Hematocrit 36 - 46 40     42     44      Platelets 150 - 400 K/uL 220     213     221         This result is from an external source.      Latest Ref Rng & Units 11/01/2022   12:00 AM 08/02/2022   12:00 AM 05/02/2022   12:00 AM  CMP  BUN 4 - _0 Creatinine 0.5 - 1.1 0.8     0.9     0.9      Sodium 137 - 147 139     140     140      Potassium 3.5 - 5.1 mEq/L 3.8     4.1     4.4      Chloride 99 - 108 109     108     104      CO2 13 - _1 Calcium 8.7 - 10.7 9.0     9.4     9.6      Alkaline Phos 25 - 125 79     79     88      AST 13 - 35 _2 ALT 7 - 35 U/L _3 This result is from an external source.     No results found for: "CEA1", "CEA" / No results found for: "CEA1", "CEA" No results found for: "PSA1" No results found for: "TGG269" No results found for: "CAN125"  Lab Results  Component Value Date   TOTALPROTELP 3.5 (L) 08/24/2017   Lab Results  Component Value Date   TIBC 214 (L) 08/21/2017   FERRITIN 745 (H) 08/21/2017   IRONPCTSAT 14 08/21/2017   Lab Results  Component Value Date   LDH 134 05/02/2021   LDH 136 11/02/2020   LDH 602 (H) 08/30/2017    STUDIES:  Exam(s): [id number] CT/CT CHEST-ABD-PELV W/IV CM  CLINICAL DATA: Restaging B-cell lymphoma. * Tracking Code: BO *  EXAM:  CT CHEST, ABDOMEN, AND PELVIS WITH CONTRAST  TECHNIQUE:  Multidetector CT imaging of the chest, abdomen and pelvis was  performed  following the standard protocol during bolus  administration of intravenous contrast.   RADIATION DOSE REDUCTION: This exam was performed according to the  departmental dose-optimization program which includes automated  exposure control, adjustment of the mA and/or kV according to  patient size and/or use of iterative reconstruction technique.   CONTRAST: 100 cc Isovue 370  COMPARISON: Numerous prior imaging studies. The most recent CT scan  is 02/07/2022 and the most recent PET-CT is in 2020   FINDINGS:  CT CHEST FINDINGS  Cardiovascular: The heart is normal in size. No pericardial  effusion. The aorta is normal in caliber. No dissection. Stable  atherosclerotic calcifications. Stable branch vessel calcifications  including coronary artery calcifications. There are also  calcifications  around the aortic valve. The Port-A-Cath is in good  position. The pulmonary arteries appear normal.  Mediastinum/Nodes: No mediastinal or hilar mass or lymphadenopathy.  The esophagus is unremarkable. The thyroid gland is unremarkable.  Lungs/Pleura: No acute pulmonary findings. No pulmonary lesions or  pulmonary nodules. No pleural effusions or pleural nodules. Stable  mild emphysematous changes.  Musculoskeletal: No breast masses, supraclavicular or axillary  adenopathy. The bony thorax is intact.   CT ABDOMEN PELVIS FINDINGS  Hepatobiliary: Stable large cavernous hemangioma at the right  hepatic dome and segment 8/7. Stable central calcification. No  worrisome hepatic lesions or intrahepatic biliary dilatation. The  gallbladder is unremarkable. No common bile duct dilatation.  Pancreas: Normal  Spleen: Normal  Adrenals/Urinary Tract: The adrenal glands are normal.  Stable simple nonenhancing left renal cyst. No further imaging  evaluation or follow-up is necessary. No worrisome renal lesions or  hydronephrosis. The bladder is unremarkable.  Stomach/Bowel: The stomach, duodenum, small bowel  and colon are  unremarkable. No acute inflammatory process, mass lesions or  obstructive findings. Stable severe descending and sigmoid colon  diverticulosis.  Vascular/Lymphatic: Stable advanced atherosclerotic calcification  involving the aorta and iliac arteries but no aneurysm or  dissection. The major venous structures are patent. No mesenteric,  retroperitoneal or pelvic adenopathy.  Reproductive: Stable calcified uterine fibroids. No adnexal mass.  Other: No free abdominal/free pelvic fluid collections. No inguinal  adenopathy or hernia.  Musculoskeletal: Stable benign intramuscular lipomas and bilateral  gluteus maximus muscles. The bony structures are intact. No bone  lesions. Stable advanced lumbar facet disease and bilateral hip  joint degenerative changes.   IMPRESSION:  1. Stable CT appearance of the chest, abdomen and pelvis. No  findings for recurrent lymphoma.  2. Stable large cavernous hemangioma in the liver.  3. Stable advanced atherosclerotic calcification involving the  thoracic and abdominal aorta and iliac arteries.  4. Aortic atherosclerosis.  Aortic Atherosclerosis (ICD10-I70.0).    HISTORY:   Past Medical History:  Diagnosis Date   Acute urinary retention 08/29/2017   AKI (acute kidney injury) ([place]) 08/20/2017   Aortic atherosclerosis (H[facility])    Bilateral pleural effusion    Cataracts, bilateral    Cytopenia    DLBCL (diffuse large B cell lymphoma) ([place]) 09/03/2017   non hodkins lymphoma right kidney and ureter   Essential hypertension    hx of off bp meds sept 2, 2018 due to weight loss   Gout    Grade I diastolic dysfunction    Heart murmur    remote history   History of blood transfusion 02/2018   History of bronchitis    History of chemotherapy finsished chemo Dec 19, 2017   History of constipation    HLD (hyperlipidemia)    Hydronephrosis    Right secondary to bulky retroperitoneal lymphadenopathy   Hyperbilirubinemia 08/29/2017    Hypercalcemia 08/20/2017   Hypomagnesemia 08/29/2017   Hypophosphatemia 08/29/2017   Hypotension 08/20/2017   Localized swelling of lower extremity    left lower leg keeps elevated   Lymphadenopathy    Macrocytic anemia 08/21/2017   Nausea & vomiting 08/20/2017   Neuromuscular disorder (HCC)    neuropathy all over from chemo   Obesity    Port-A-Cath in place    Rheumatoid arthritis ([place])    Right renal mass 08/2017   Rotator cuff tear    left   Sepsis ([place]) 08/20/2017   Splenomegaly    Thrombocytopenia ([facility])    Ureteral obstruction, right    Malignate  Past Surgical History:  Procedure Laterality Date   APPENDECTOMY  age 16   BONE MARROW BIOPSY  08/31/2017   BREAST SURGERY Right 30 yrs ago   benign   CARPAL TUNNEL RELEASE Bilateral    CATARACT EXTRACTION, BILATERAL     02/2018: right 03/2018 left   colonscopy     CYSTOSCOPY W/ URETERAL STENT PLACEMENT Right 02/18/2018   Procedure: CYSTOSCOPY WITH RETROGRADE PYELOGRAM/URETERAL STENT EXCHANGE;  Surgeon: Kathie Rhodes, MD;  Location: [facility];  Service: Urology;  Laterality: Right;   CYSTOSCOPY W/ URETERAL STENT PLACEMENT Right 08/02/2018   Procedure: CYSTOSCOPY WITH STENT REMOVAL AND REPLACEMENT;  Surgeon: Kathie Rhodes, MD;  Location: [facility];  Service: Urology;  Laterality: Right;   CYSTOSCOPY W/ URETERAL STENT PLACEMENT Right 12/30/2018   Procedure: CYSTOSCOPY, RETROGRADE  WITH STENT REMOVAL;  Surgeon: Kathie Rhodes, MD;  Location: [place] [facility];  Service: Urology;  Laterality: Right;   IR FLUORO GUIDE PORT INSERTION RIGHT  09/06/2017   IR NEPHROSTOMY EXCHANGE RIGHT  08/31/2017   IR NEPHROSTOMY EXCHANGE RIGHT  09/11/2017   IR NEPHROSTOMY PLACEMENT RIGHT  08/21/2017   IR URETERAL STENT PLACEMENT EXISTING ACCESS RIGHT  11/19/2017   IR US GUIDE VASC ACCESS RIGHT  09/06/2017   LAPAROSCOPIC SALPINGO OOPHERECTOMY Right    Left supraclavicular lymph node biopsy  08/24/2017   retroperitioneal lymph  node biopsy   09/02/2017   THORACENTESIS Left 09/20/2017    Family History  Problem Relation Age of Onset   CVA Mother     Social History:  reports that she quit smoking about 5 years ago. Her smoking use included cigarettes. She has a 25.00 pack-year smoking history. She has never used smokeless tobacco. She reports current alcohol use. She reports that she does not use drugs.The patient is alone today.  Allergies:  Allergies  Allergen Reactions   Midazolam Other (See Comments)    Cannot use versed per dr Melina Copa gi doctor Cannot use versed per dr butler gi doctor Other reaction(s): Other Cannot use versed per dr Melina Copa gi doctor Cannot use versed per dr butler gi doctor Cannot use versed per dr butler gi doctor   Streptomycin Anaphylaxis, Other (See Comments) and Swelling    Numb tongue, fingers and mouth  Other reaction(s): Other  NUMBMESS OF MOUTH, TONGUE AND HANDS   Other reaction(s): facial swelling    Current Medications: Current Outpatient Medications  Medication Sig Dispense Refill   furosemide (LASIX) 20 MG tablet Take 20 mg by mouth as needed.     potassium chloride SA (KLOR-CON M) 20 MEQ tablet Take 20 mEq by mouth as needed. As needed after taking lasix     traMADol (ULTRAM) 50 MG tablet Take 1 tablet by mouth as needed.     valACYclovir (VALTREX) 500 MG tablet Take 500 mg by mouth 2 (two) times daily as needed.     allopurinol (ZYLOPRIM) 300 MG tablet Take 300 mg by mouth every morning.      Cholecalciferol 25 MCG (1000 UT) tablet Take by mouth.     No current facility-administered medications for this visit.

## 2022-11-02 ENCOUNTER — Other Ambulatory Visit: Payer: Self-pay | Admitting: Hematology and Oncology

## 2022-11-02 ENCOUNTER — Encounter: Payer: Self-pay | Admitting: Hematology and Oncology

## 2022-11-02 ENCOUNTER — Inpatient Hospital Stay: Payer: Medicare Other | Attending: Hematology and Oncology | Admitting: Hematology and Oncology

## 2022-11-02 VITALS — BP 167/82 | HR 62 | Temp 97.8°F | Resp 20 | Ht 58.2 in | Wt 173.1 lb

## 2022-11-02 DIAGNOSIS — C8338 Diffuse large B-cell lymphoma, lymph nodes of multiple sites: Secondary | ICD-10-CM

## 2022-11-06 ENCOUNTER — Encounter: Payer: Self-pay | Admitting: Oncology

## 2022-11-28 DIAGNOSIS — Z1231 Encounter for screening mammogram for malignant neoplasm of breast: Secondary | ICD-10-CM | POA: Diagnosis not present

## 2022-12-04 DIAGNOSIS — Z0000 Encounter for general adult medical examination without abnormal findings: Secondary | ICD-10-CM | POA: Diagnosis not present

## 2022-12-04 DIAGNOSIS — Z6836 Body mass index (BMI) 36.0-36.9, adult: Secondary | ICD-10-CM | POA: Diagnosis not present

## 2022-12-04 DIAGNOSIS — Z1331 Encounter for screening for depression: Secondary | ICD-10-CM | POA: Diagnosis not present

## 2022-12-04 DIAGNOSIS — C8330 Diffuse large B-cell lymphoma, unspecified site: Secondary | ICD-10-CM | POA: Diagnosis not present

## 2022-12-04 DIAGNOSIS — I700 Atherosclerosis of aorta: Secondary | ICD-10-CM | POA: Diagnosis not present

## 2022-12-04 DIAGNOSIS — M109 Gout, unspecified: Secondary | ICD-10-CM | POA: Diagnosis not present

## 2022-12-06 ENCOUNTER — Encounter: Payer: Self-pay | Admitting: Oncology

## 2022-12-07 ENCOUNTER — Telehealth: Payer: Self-pay

## 2022-12-07 NOTE — Telephone Encounter (Signed)
-----   Message from Marvia Pickles, PA-C sent at 12/07/2022 12:58 PM EST ----- Please let her know her mammo was normal. Thanks

## 2022-12-07 NOTE — Telephone Encounter (Signed)
Patient notified and voiced understanding.

## 2023-01-26 ENCOUNTER — Encounter: Payer: Self-pay | Admitting: Oncology

## 2023-02-02 ENCOUNTER — Inpatient Hospital Stay: Payer: Medicare Other

## 2023-02-02 ENCOUNTER — Inpatient Hospital Stay: Payer: Medicare Other | Attending: Oncology | Admitting: Oncology

## 2023-02-02 ENCOUNTER — Encounter: Payer: Self-pay | Admitting: Oncology

## 2023-02-02 ENCOUNTER — Other Ambulatory Visit: Payer: Self-pay | Admitting: Oncology

## 2023-02-02 VITALS — BP 140/78 | HR 80 | Temp 98.8°F | Resp 16 | Ht 58.2 in | Wt 168.1 lb

## 2023-02-02 DIAGNOSIS — C8338 Diffuse large B-cell lymphoma, lymph nodes of multiple sites: Secondary | ICD-10-CM | POA: Diagnosis not present

## 2023-02-02 DIAGNOSIS — E785 Hyperlipidemia, unspecified: Secondary | ICD-10-CM | POA: Diagnosis not present

## 2023-02-02 DIAGNOSIS — R7303 Prediabetes: Secondary | ICD-10-CM

## 2023-02-02 DIAGNOSIS — Z8572 Personal history of non-Hodgkin lymphomas: Secondary | ICD-10-CM | POA: Insufficient documentation

## 2023-02-02 DIAGNOSIS — I10 Essential (primary) hypertension: Secondary | ICD-10-CM | POA: Insufficient documentation

## 2023-02-02 LAB — CMP (CANCER CENTER ONLY)
ALT: 10 U/L (ref 0–44)
AST: 15 U/L (ref 15–41)
Albumin: 4 g/dL (ref 3.5–5.0)
Alkaline Phosphatase: 78 U/L (ref 38–126)
Anion gap: 9 (ref 5–15)
BUN: 12 mg/dL (ref 8–23)
CO2: 25 mmol/L (ref 22–32)
Calcium: 8.9 mg/dL (ref 8.9–10.3)
Chloride: 106 mmol/L (ref 98–111)
Creatinine: 0.98 mg/dL (ref 0.44–1.00)
GFR, Estimated: 59 mL/min — ABNORMAL LOW (ref >60)
Glucose, Bld: 125 mg/dL — ABNORMAL HIGH (ref 70–99)
Potassium: 3.8 mmol/L (ref 3.5–5.1)
Sodium: 140 mmol/L (ref 135–145)
Total Bilirubin: 0.7 mg/dL (ref 0.3–1.2)
Total Protein: 6.6 g/dL (ref 6.5–8.1)

## 2023-02-02 LAB — CBC WITH DIFFERENTIAL (CANCER CENTER ONLY)
Abs Immature Granulocytes: 0.03 10*3/uL (ref 0.00–0.07)
Basophils Absolute: 0.1 10*3/uL (ref 0.0–0.1)
Basophils Relative: 1 %
Eosinophils Absolute: 0.3 10*3/uL (ref 0.0–0.5)
Eosinophils Relative: 3 %
HCT: 38.9 % (ref 36.0–46.0)
Hemoglobin: 13 g/dL (ref 12.0–15.0)
Immature Granulocytes: 0 %
Lymphocytes Relative: 17 %
Lymphs Abs: 1.6 10*3/uL (ref 0.7–4.0)
MCH: 32.6 pg (ref 26.0–34.0)
MCHC: 33.4 g/dL (ref 30.0–36.0)
MCV: 97.5 fL (ref 80.0–100.0)
Monocytes Absolute: 0.7 10*3/uL (ref 0.1–1.0)
Monocytes Relative: 7 %
Neutro Abs: 6.7 10*3/uL (ref 1.7–7.7)
Neutrophils Relative %: 72 %
Platelet Count: 272 10*3/uL (ref 150–400)
RBC: 3.99 MIL/uL (ref 3.87–5.11)
RDW: 12.6 % (ref 11.5–15.5)
WBC Count: 9.4 10*3/uL (ref 4.0–10.5)
nRBC: 0 % (ref 0.0–0.2)

## 2023-02-02 LAB — LIPID PANEL
Cholesterol: 229 mg/dL — ABNORMAL HIGH (ref 0–200)
HDL: 35 mg/dL — ABNORMAL LOW (ref >40)
LDL Cholesterol: 159 mg/dL — ABNORMAL HIGH (ref 0–99)
Total CHOL/HDL Ratio: 6.5 RATIO
Triglycerides: 174 mg/dL — ABNORMAL HIGH (ref <150)
VLDL: 35 mg/dL (ref 0–40)

## 2023-02-02 LAB — URIC ACID: Uric Acid, Serum: 4.5 mg/dL (ref 2.5–7.1)

## 2023-02-02 NOTE — Progress Notes (Signed)
[name]  [address] [place],  [place]  [address] [phone]    Clinic Day:  02/02/23   Referring physician: [name], MD  ASSESSMENT & PLAN:   Assessment & Plan: DLBCL (diffuse large B cell lymphoma) ([place]) Recurrent diffuse large B-cell lymphoma, which was originally diagnosed in September 2018, and treated with 6 cycles of R-CHOP, as well as intrathecal chemotherapy.  She then relapsed in June 2020.  She received 2 cycles of R-ICE, which she tolerated well with an excellent response.  She was essentially in complete remission by PET scan, so underwent autologous stem cell transplant in September 2020, and is now 3 years out.  She completed her ibrutinib clinical trial in September 2021 and will have one more follow-up visit in April, 2024.  PET scan in February 2021 remained negative.  Her last scans in February did not reveal any evidence of recurrence.  She does not have clinical evidence of recurrence today.  She is due for follow-up at [facility] in September, which is annual now.    Plan Patient has had her port flushed today. She is scheduled to go back to [facility] twice this year. In April it will be a final follow up of her clinical trial of Ibrutinib. Then in September she will have her survivorship follow-up with the Heme/Onc team for yearly check. Her labs today are pending. I will see her back in 3 months with CBC, CMP, LDH, TSH, a port flush, and CT chest, abdomen, and pelvis. She will receive her tests 2 days prior to seeing me. The patient understands the plans discussed today and is in agreement with them.  She knows to contact our office if she develops concerns prior to her next appointment.   I provided 25 minutes of face-to-face time during this encounter and > 50% was spent counseling as documented under my assessment and plan.    [name], MD  [facility] [address] [place] [address] Dept: [phone] Dept Fax: [phone]   Orders Placed This Encounter  Procedures   Lipid panel   Uric acid      CHIEF COMPLAINT:  CC: Stage IV high-grade diffuse large B-cell lymphoma  Current Treatment: Observation  HISTORY OF PRESENT ILLNESS:  Kristina Torres is a 77 y.o. female with a history of stage IV high-grade diffuse large B-cell lymphoma initially treated in 2018 with R-CHOP and intrathecal methotrexate, completed in January of 2019. She had a fairly complicated course, but finally recovered.  She was found to have recurrent disease in June 2020.  PET scan was positive in the retroperitoneal adenopathy but not elsewhere.  She saw Dr. Cassell Clement at [facility] and she agreed that Kristina Torres was a candidate for autologous stem cell transplant.  We were unable to obtain a tissue confirmation as the only possible approach would be an open laparotomy.  Dr.  Noberto Retort has reviewed the case and feels she would need to go to a tertiary care center for this surgery.  We felt the morbidity of that surgery and the delay in starting her treatment was not worth the additional information.  Dr. Cassell Clement agreed.  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  Dr. Cassell Clement recommended two cycles of R-ICE followed by a PET scan.  The patient had her first cycle on  June 23rd, and she did very well with no nausea, bowel problems, hematuria, or fever.  With her first cycle of R-ICE, her white count did go down to 0.6 and her platelets went as low as 6,000, so she did receive a plateletpheresis.  She had no fever or infection.  She received platelet pheresis in July after her 2nd cycle of R-ICE.  She was seen on August 10th after 2 cycles of R-ICE.  Repeat PET scan, revealed a marked interval response to therapy of the intensely hypermetabolic retroperitoneal  abdominal lymphadenopathy seen on the prior PET-CT, as well as revealed a stable size of a calcified precarinal lymph node that reveal low level hypermetabolism.  Bone marrow biopsy done the week prior showed hypercellularity with plenty of iron stores.  In preparation for stem cell collection, she underwent pegfilgrastim injections August 15th through the 17th and also received Mozobil, followed by stem cell collection.   She underwent stem cell transplant in September 2020.  A Hickman catheter was placed while she was admitted for transplant, but she went into ventricular tachycardia, so this was subsequently removed.  After transplant, she was admitted to the hospital due to a low blood count and received platelets twice.  When we saw her in September, she was found to have an E coli urinary infection, which was treated with full dose Bactrim DS.  She was placed on Bactrim DS prophylaxis once daily on Mondays, Wednesdays, and Fridays in October for 3 months.   She was seen again at [facility] in October.  She is on a double blinded clinical trial evaluating ibrutinib versus placebo posttransplant and takes 4 capsules daily.  She saw Dr. Cassell Clement in December for her day 100.  PET in February 2021 did not reveal any evidence of recurrent disease.  She has had continued routine imaging without evidence of recurrence.  Most recently, she had a CT neck, chest, abdomen and pelvis in February of this year at [facility] which did not reveal any evidence of recurrence.  Stable benign hepatic hemangioma was seen.  Colonic diverticulosis was seen.  Calcified uterine leiomyomas were seen.  She is up-to-date on screening mammogram with the last being in December 2022.  INTERVAL HISTORY:  Kristina Torres is here today for repeat clinical assessment for stage IV high-grade diffuse large B-cell lymphoma. Patient states that she is ok but has had a head cold with a cough and clear phlegm with drainage. She is taking Zithromax to  alleviate her symptoms. She denies a sore throat or chest pain. Patient has had her port flushed today. She is scheduled to go back to [facility] twice this year. In April it will be a final follow up of her clinical trial of Ibrutinib. Then in September she will have her survivorship follow-up with the Heme/Onc team for yearly check. Her labs today are pending. I will see her back in 3 months with CBC, CMP, LDH, TSH, a port flush, and CT chest, abdomen, and pelvis. She will receive her tests 2 days prior to seeing me. She denies signs of infection such as sore throat, or urinary symptoms.  She denies fevers or recurrent chills. She denies pain. She denies nausea, vomiting, chest pain, dyspnea or cough. Her weight has decreased 5 pounds over last 3 months .  REVIEW OF SYSTEMS:  Review of Systems  Constitutional: Negative.  Negative for appetite change, chills, diaphoresis, fatigue, fever and unexpected weight change.  HENT:  Negative.  Negative for hearing loss,  lump/mass, mouth sores, nosebleeds, sore throat, tinnitus, trouble swallowing and voice change.        Head cold.  Eyes: Negative.  Negative for eye problems and icterus.  Respiratory:  Positive for cough (clear phlegm). Negative for chest tightness, hemoptysis, shortness of breath and wheezing.   Cardiovascular: Negative.  Negative for chest pain, leg swelling and palpitations.  Gastrointestinal:  Positive for diarrhea (occasionally since chemo). Negative for abdominal distention, abdominal pain, blood in stool, constipation, nausea, rectal pain and vomiting.  Endocrine: Negative.  Negative for hot flashes.  Genitourinary: Negative.  Negative for bladder incontinence, difficulty urinating, dyspareunia, dysuria, frequency, hematuria, menstrual problem, nocturia, pelvic pain, vaginal bleeding and vaginal discharge.   Musculoskeletal: Negative.  Negative for arthralgias, back pain, flank pain, gait problem, myalgias, neck pain and neck  stiffness.  Skin: Negative.  Negative for itching, rash and wound.  Neurological: Negative.  Negative for dizziness, extremity weakness, gait problem, headaches, light-headedness, numbness, seizures and speech difficulty.  Hematological: Negative.  Negative for adenopathy. Does not bruise/bleed easily.  Psychiatric/Behavioral: Negative.  Negative for confusion, decreased concentration, depression, sleep disturbance and suicidal ideas. The patient is not nervous/anxious.      VITALS:  Blood pressure (!) 140/78, pulse 80, temperature 98.8 F (37.1 C), temperature source Oral, resp. rate 16, height 4' 10.2" (1.478 m), weight 168 lb 1.6 oz (76.2 kg), SpO2 98 %.  Wt Readings from Last 3 Encounters:  02/02/23 168 lb 1.6 oz (76.2 kg)  11/02/22 173 lb 1.6 oz (78.5 kg)  08/02/22 168 lb (76.2 kg)    Body mass index is 34.89 kg/m.  Performance status (ECOG): 0 - Asymptomatic  PHYSICAL EXAM:  Physical Exam Vitals and nursing note reviewed.  Constitutional:      General: She is not in acute distress.    Appearance: Normal appearance. She is normal weight. She is not ill-appearing, toxic-appearing or diaphoretic.  HENT:     Head: Normocephalic and atraumatic.     Right Ear: Tympanic membrane, ear canal and external ear normal. There is no impacted cerumen.     Left Ear: Tympanic membrane, ear canal and external ear normal. There is no impacted cerumen.     Nose: Nose normal. No congestion or rhinorrhea.     Mouth/Throat:     Mouth: Mucous membranes are moist.     Pharynx: Oropharynx is clear. No oropharyngeal exudate or posterior oropharyngeal erythema.  Eyes:     General: No scleral icterus.       Right eye: No discharge.        Left eye: No discharge.     Extraocular Movements: Extraocular movements intact.     Conjunctiva/sclera: Conjunctivae normal.     Pupils: Pupils are equal, round, and reactive to light.  Neck:     Vascular: No carotid bruit.  Cardiovascular:     Rate and  Rhythm: Normal rate and regular rhythm.     Pulses: Normal pulses.     Heart sounds: Normal heart sounds. No murmur heard.    No friction rub. No gallop.  Pulmonary:     Effort: Pulmonary effort is normal. No respiratory distress.     Breath sounds: Normal breath sounds. No stridor. No wheezing, rhonchi or rales.  Chest:     Chest wall: No tenderness.  Abdominal:     General: Bowel sounds are normal. There is no distension.     Palpations: Abdomen is soft. There is no hepatomegaly, splenomegaly or mass.     Tenderness: There  is no abdominal tenderness. There is no right CVA tenderness, left CVA tenderness, guarding or rebound.     Hernia: No hernia is present.  Musculoskeletal:        General: No swelling, tenderness, deformity or signs of injury. Normal range of motion.     Cervical back: Normal range of motion and neck supple. No rigidity or tenderness.     Right lower leg: No edema.     Left lower leg: No edema.  Lymphadenopathy:     Cervical: No cervical adenopathy.     Right cervical: No superficial, deep or posterior cervical adenopathy.    Left cervical: No superficial, deep or posterior cervical adenopathy.     Upper Body:     Right upper body: No supraclavicular, axillary or pectoral adenopathy.     Left upper body: No supraclavicular, axillary or pectoral adenopathy.     Lower Body: No right inguinal adenopathy. No left inguinal adenopathy.  Skin:    General: Skin is warm and dry.     Coloration: Skin is not jaundiced or pale.     Findings: No bruising, erythema, lesion or rash.  Neurological:     General: No focal deficit present.     Mental Status: She is alert and oriented to person, place, and time. Mental status is at baseline.     Cranial Nerves: No cranial nerve deficit.     Sensory: No sensory deficit.     Motor: No weakness.     Coordination: Coordination normal.     Gait: Gait normal.     Deep Tendon Reflexes: Reflexes normal.  Psychiatric:        Mood and  Affect: Mood normal.        Behavior: Behavior normal.        Thought Content: Thought content normal.        Judgment: Judgment normal.     LABS:      Latest Ref Rng & Units 02/02/2023   10:10 AM 11/01/2022   12:00 AM 08/02/2022   12:00 AM  CBC  WBC 4.0 - 10.5 K/uL 9.4  5.8     6.1      Hemoglobin 12.0 - 15.0 g/dL 13.0  13.7     14.1      Hematocrit 36.0 - 46.0 % 38.9  40     42      Platelets 150 - 400 K/uL 272  220     213         This result is from an external source.      Latest Ref Rng & Units 02/02/2023   10:10 AM 11/01/2022   12:00 AM 08/02/2022   12:00 AM  CMP  Glucose 70 - 99 mg/dL 125     BUN 8 - 23 mg/dL '12  12     12      '$ Creatinine 0.44 - 1.00 mg/dL 0.98  0.8     0.9      Sodium 135 - 145 mmol/L 140  139     140      Potassium 3.5 - 5.1 mmol/L 3.8  3.8     4.1      Chloride 98 - 111 mmol/L 106  109     108      CO2 22 - 32 mmol/L '25  23     27      '$ Calcium 8.9 - 10.3 mg/dL 8.9  9.0     9.4  Total Protein 6.5 - 8.1 g/dL 6.6     Total Bilirubin 0.3 - 1.2 mg/dL 0.7     Alkaline Phos 38 - 126 U/L 78  79     79      AST 15 - 41 U/L '15  23     20      '$ ALT 0 - 44 U/L '10  16     15         '$ This result is from an external source.     No results found for: "CEA1", "CEA" / No results found for: "CEA1", "CEA" No results found for: "PSA1" No results found for: "[id number]" No results found for: "CAN125"  Lab Results  Component Value Date   TOTALPROTELP 3.5 (L) 08/24/2017   Lab Results  Component Value Date   TIBC 214 (L) 08/21/2017   FERRITIN 745 (H) 08/21/2017   IRONPCTSAT 14 08/21/2017   Lab Results  Component Value Date   LDH 134 05/02/2021   LDH 136 11/02/2020   LDH 602 (H) 08/30/2017    STUDIES:  No results found.       HISTORY:   Past Medical History:  Diagnosis Date   Acute urinary retention 08/29/2017   AKI (acute kidney injury) ([place]) 08/20/2017   Aortic atherosclerosis ([facility])    Bilateral pleural effusion    Cataracts, bilateral     Cytopenia    DLBCL (diffuse large B cell lymphoma) ([place]) 09/03/2017   non hodkins lymphoma right kidney and ureter   Essential hypertension    hx of off bp meds sept 2, 2018 due to weight loss   Gout    Grade I diastolic dysfunction    Heart murmur    remote history   History of blood transfusion 02/2018   History of bronchitis    History of chemotherapy finsished chemo Dec 19, 2017   History of constipation    HLD (hyperlipidemia)    Hydronephrosis    Right secondary to bulky retroperitoneal lymphadenopathy   Hyperbilirubinemia 08/29/2017   Hypercalcemia 08/20/2017   Hypomagnesemia 08/29/2017   Hypophosphatemia 08/29/2017   Hypotension 08/20/2017   Localized swelling of lower extremity    left lower leg keeps elevated   Lymphadenopathy    Macrocytic anemia 08/21/2017   Nausea & vomiting 08/20/2017   Neuromuscular disorder (HCC)    neuropathy all over from chemo   Obesity    Port-A-Cath in place    Rheumatoid arthritis ([place])    Right renal mass 08/2017   Rotator cuff tear    left   Sepsis ([place]) 08/20/2017   Splenomegaly    Thrombocytopenia ([place])    Ureteral obstruction, right    Malignate    Past Surgical History:  Procedure Laterality Date   APPENDECTOMY  age 89   BONE MARROW BIOPSY  08/31/2017   BREAST SURGERY Right 30 yrs ago   benign   CARPAL TUNNEL RELEASE Bilateral    CATARACT EXTRACTION, BILATERAL     02/2018: right 03/2018 left   colonscopy     CYSTOSCOPY W/ URETERAL STENT PLACEMENT Right 02/18/2018   Procedure: CYSTOSCOPY WITH RETROGRADE PYELOGRAM/URETERAL STENT EXCHANGE;  Surgeon: Kathie Rhodes, MD;  Location: [facility];  Service: Urology;  Laterality: Right;   CYSTOSCOPY W/ URETERAL STENT PLACEMENT Right 08/02/2018   Procedure: CYSTOSCOPY WITH STENT REMOVAL AND REPLACEMENT;  Surgeon: Kathie Rhodes, MD;  Location: [facility];  Service: Urology;  Laterality: Right;   CYSTOSCOPY W/ URETERAL STENT PLACEMENT Right 12/30/2018  Procedure:  CYSTOSCOPY, RETROGRADE  WITH STENT REMOVAL;  Surgeon: Kathie Rhodes, MD;  Location[facility];  Service: Urology;  Laterality: Right;   IR FLUORO GUIDE PORT INSERTION RIGHT  09/06/2017   IR NEPHROSTOMY EXCHANGE RIGHT  08/31/2017   IR NEPHROSTOMY EXCHANGE RIGHT  09/11/2017   IR NEPHROSTOMY PLACEMENT RIGHT  08/21/2017   IR URETERAL STENT PLACEMENT EXISTING ACCESS RIGHT  11/19/2017   IR US GUIDE VASC ACCESS RIGHT  09/06/2017   LAPAROSCOPIC SALPINGO OOPHERECTOMY Right    Left supraclavicular lymph node biopsy  08/24/2017   retroperitioneal lymph node biopsy   09/02/2017   THORACENTESIS Left 09/20/2017    Family History  Problem Relation Age of Onset   CVA Mother     Social History:  reports that she quit smoking about 5 years ago. Her smoking use included cigarettes. She has a 25.00 pack-year smoking history. She has never used smokeless tobacco. She reports current alcohol use. She reports that she does not use drugs.The patient is alone.  Her labs are unremarkable so there everything is fair we will do but will work on her do maybe I did I will now today.  Allergies:  Allergies  Allergen Reactions   Midazolam Other (See Comments)    Cannot use versed per dr Melina Copa gi doctor Cannot use versed per dr butler gi doctor Other reaction(s): Other Cannot use versed per dr Melina Copa gi doctor Cannot use versed per dr butler gi doctor Cannot use versed per dr butler gi doctor   Streptomycin Anaphylaxis, Other (See Comments) and Swelling    Numb tongue, fingers and mouth  Other reaction(s): Other  NUMBMESS OF MOUTH, TONGUE AND HANDS   Other reaction(s): facial swelling    Current Medications: Current Outpatient Medications  Medication Sig Dispense Refill   allopurinol (ZYLOPRIM) 300 MG tablet Take 300 mg by mouth as directed. Mon, Wed, Fri     Cholecalciferol 25 MCG (1000 UT) tablet Take by mouth. Mon, Wed, Fri     furosemide (LASIX) 20 MG tablet Take 20 mg by mouth as needed.      potassium chloride SA (KLOR-CON M) 20 MEQ tablet Take 20 mEq by mouth as needed. As needed after taking lasix     traMADol (ULTRAM) 50 MG tablet Take 1 tablet by mouth as needed.     valACYclovir (VALTREX) 500 MG tablet Take 500 mg by mouth 2 (two) times daily as needed.     No current facility-administered medications for this visit.      I,Jasmine M Lassiter,acting as a scribe for [name], MD.,have documented all relevant documentation on the behalf of [name], MD,as directed by  [name], MD while in the presence of [name], MD.

## 2023-02-06 ENCOUNTER — Telehealth: Payer: Self-pay

## 2023-02-06 NOTE — Telephone Encounter (Addendum)
Pt notified of below. She requests that we send labs to her PCP, Dr Laqueta Due as well. Angela,LPN, stated she had already faxed the labs. ----- Message from [name], MD sent at 02/06/2023  1:51 PM EST ----- Regarding: call Tell her labs look great, BS was 125, send copy to [facility], she can tell you where

## 2023-02-15 ENCOUNTER — Encounter: Payer: Self-pay | Admitting: Oncology

## 2023-02-16 ENCOUNTER — Encounter: Payer: Self-pay | Admitting: Oncology

## 2023-02-27 DIAGNOSIS — N390 Urinary tract infection, site not specified: Secondary | ICD-10-CM | POA: Diagnosis not present

## 2023-03-12 DIAGNOSIS — L820 Inflamed seborrheic keratosis: Secondary | ICD-10-CM | POA: Diagnosis not present

## 2023-03-12 DIAGNOSIS — L728 Other follicular cysts of the skin and subcutaneous tissue: Secondary | ICD-10-CM | POA: Diagnosis not present

## 2023-03-13 DIAGNOSIS — N390 Urinary tract infection, site not specified: Secondary | ICD-10-CM | POA: Diagnosis not present

## 2023-03-22 DIAGNOSIS — C8333 Diffuse large B-cell lymphoma, intra-abdominal lymph nodes: Secondary | ICD-10-CM | POA: Diagnosis not present

## 2023-03-22 DIAGNOSIS — Z006 Encounter for examination for normal comparison and control in clinical research program: Secondary | ICD-10-CM | POA: Diagnosis not present

## 2023-04-05 DIAGNOSIS — N390 Urinary tract infection, site not specified: Secondary | ICD-10-CM | POA: Diagnosis not present

## 2023-04-19 DIAGNOSIS — N952 Postmenopausal atrophic vaginitis: Secondary | ICD-10-CM | POA: Diagnosis not present

## 2023-04-19 DIAGNOSIS — N390 Urinary tract infection, site not specified: Secondary | ICD-10-CM | POA: Diagnosis not present

## 2023-04-19 DIAGNOSIS — Z79899 Other long term (current) drug therapy: Secondary | ICD-10-CM | POA: Diagnosis not present

## 2023-05-01 DIAGNOSIS — I517 Cardiomegaly: Secondary | ICD-10-CM | POA: Diagnosis not present

## 2023-05-01 DIAGNOSIS — D649 Anemia, unspecified: Secondary | ICD-10-CM | POA: Diagnosis not present

## 2023-05-01 DIAGNOSIS — D1803 Hemangioma of intra-abdominal structures: Secondary | ICD-10-CM | POA: Diagnosis not present

## 2023-05-01 DIAGNOSIS — K5730 Diverticulosis of large intestine without perforation or abscess without bleeding: Secondary | ICD-10-CM | POA: Diagnosis not present

## 2023-05-01 DIAGNOSIS — I700 Atherosclerosis of aorta: Secondary | ICD-10-CM | POA: Diagnosis not present

## 2023-05-01 DIAGNOSIS — C8330 Diffuse large B-cell lymphoma, unspecified site: Secondary | ICD-10-CM | POA: Diagnosis not present

## 2023-05-01 DIAGNOSIS — N281 Cyst of kidney, acquired: Secondary | ICD-10-CM | POA: Diagnosis not present

## 2023-05-01 DIAGNOSIS — N390 Urinary tract infection, site not specified: Secondary | ICD-10-CM | POA: Diagnosis not present

## 2023-05-01 DIAGNOSIS — M47816 Spondylosis without myelopathy or radiculopathy, lumbar region: Secondary | ICD-10-CM | POA: Diagnosis not present

## 2023-05-01 DIAGNOSIS — C8338 Diffuse large B-cell lymphoma, lymph nodes of multiple sites: Secondary | ICD-10-CM | POA: Diagnosis not present

## 2023-05-01 DIAGNOSIS — E7849 Other hyperlipidemia: Secondary | ICD-10-CM | POA: Diagnosis not present

## 2023-05-01 DIAGNOSIS — M4316 Spondylolisthesis, lumbar region: Secondary | ICD-10-CM | POA: Diagnosis not present

## 2023-05-01 LAB — BASIC METABOLIC PANEL
BUN: 16 (ref 4–21)
CO2: 23 — AB (ref 13–22)
Chloride: 109 — AB (ref 99–108)
Creatinine: 0.9 (ref 0.5–1.1)
EGFR: 60
Glucose: 111
Potassium: 3.6 mEq/L (ref 3.5–5.1)
Sodium: 141 (ref 137–147)

## 2023-05-01 LAB — COMPREHENSIVE METABOLIC PANEL
Albumin: 4.1 (ref 3.5–5.0)
Calcium: 9.6 (ref 8.7–10.7)

## 2023-05-01 LAB — HEPATIC FUNCTION PANEL
ALT: 15 U/L (ref 7–35)
AST: 20 (ref 13–35)
Alkaline Phosphatase: 61 (ref 25–125)
Bilirubin, Total: 0

## 2023-05-01 LAB — TSH: TSH: 1.27 (ref 0.41–5.90)

## 2023-05-01 LAB — CBC AND DIFFERENTIAL
HCT: 40 (ref 36–46)
Hemoglobin: 13.7 (ref 12.0–16.0)
Neutrophils Absolute: 4.72
Platelets: 214 10*3/uL (ref 150–400)
WBC: 8.9

## 2023-05-01 LAB — CBC: RBC: 4.09 (ref 3.87–5.11)

## 2023-05-01 NOTE — Progress Notes (Signed)
[facility]  [address] [place],  [place]  [address] [phone]    Clinic Day:  05/03/23  Referring physician: Philemon Kingdom, MD  ASSESSMENT & PLAN:  Assessment & Plan: DLBCL (diffuse large B cell lymphoma) (HCC) Recurrent diffuse large B-cell lymphoma, which was originally diagnosed in September 2018, and treated with 6 cycles of R-CHOP, as well as intrathecal chemotherapy.  She then relapsed in June 2020.  She received 2 cycles of R-ICE, which she tolerated well with an excellent response.  She was essentially in complete remission by PET scan, so underwent autologous stem cell transplant in September 2020, and is now 3 years out.  She completed her ibrutinib clinical trial in September 2021 and will have one more follow-up visit in April, 2024.  PET scan in February 2021 remained negative.  CT scans and Labs 05/03/2023 show she remains in remission. She is due for follow-up at [facility] in September, which is annual now.    Plan She just finished antibiotics for a positive E.coli recurrent UTI and will follow-up with the urologist with repeat culture. Patient had a CT chest, abdomen, and pelvis with contrast on 05/01/2023 that revealed no findings of active malignancy, mild cardiomegaly, mild aortic valve calcification, stable large cavernous hemangioma in the dome of the right hepatic lobe, sigmoid colon diverticulosis, calcified uterine fibroids, lipomas of the gluteus maximus muscles bilaterally and stable grade 1 anterolisthesis at L4-5 with substantial lumbar spondylosis and degenerative disc disease. Her WBC is 8.9, hemoglobin 13.7, and platelet count at 214,000. Her CMP is normal. She requested a lipid panel but we did not have sufficient blood to do so. She returns back to [facility] in September for her 4th year check up post bone marrow transplant, she will have her port and labs done then. I will send her documents to [facility] before her appointment. I will see her back in 8 months with CBC, CMP, and plan port flush. The patient understands the plans discussed today and is in agreement with them.  She knows to contact our office if she develops concerns prior to her next appointment.  I provided 33 minutes of face-to-face time during this encounter and > 50% was spent counseling as documented under my assessment and plan.    Kristina Beckwith, MD  [facility] [address] [place] [address] Dept: [phone] Dept Fax: [phone]   No orders of the defined types were placed in this encounter.     CHIEF COMPLAINT:  CC: Stage IV high-grade diffuse large B-cell lymphoma  Current Treatment: Observation  HISTORY OF PRESENT ILLNESS:  [facility] is a 77 y.o. female with a history of stage IV high-grade diffuse large B-cell lymphoma initially treated in 2018 with R-CHOP and intrathecal methotrexate, completed in January of 2019. She had a fairly complicated course, but finally recovered.  She was found to have recurrent disease in June 2020.  PET scan was positive in the retroperitoneal adenopathy but not elsewhere.  She saw Dr. Alphonsa [facility] at [facility] and she agreed that [facility] was a candidate for autologous stem cell transplant.  We were unable to obtain a tissue confirmation as the only possible approach would be an open laparotomy.  Dr.  Georgiana [facility] has reviewed the case and feels she would need to go to a tertiary care [facility] for this surgery.  We felt the morbidity of that  surgery and the delay in starting her treatment was not worth the additional information.  Dr. Alphonsa [facility] agreed.  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  Dr. Alphonsa [facility] recommended two cycles of R-ICE followed by a PET scan.  The patient had her first cycle on June 23rd, and she did very well with no  nausea, bowel problems, hematuria, or fever.  With her first cycle of R-ICE, her white count did go down to 0.6 and her platelets went as low as 6,000, so she did receive a plateletpheresis.  She had no fever or infection.  She received platelet pheresis in July after her 2nd cycle of R-ICE.  She was seen on August 10th after 2 cycles of R-ICE.  Repeat PET scan, revealed a marked interval response to therapy of the intensely hypermetabolic retroperitoneal abdominal lymphadenopathy seen on the prior PET-CT, as well as revealed a stable size of a calcified precarinal lymph node that reveal low level hypermetabolism.  Bone marrow biopsy done the week prior showed hypercellularity with plenty of iron stores.  In preparation for stem cell collection, she underwent pegfilgrastim injections August 15th through the 17th and also received Mozobil, followed by stem cell collection.   She underwent stem cell transplant in September 2020.  A Hickman catheter was placed while she was admitted for transplant, but she went into ventricular tachycardia, so this was subsequently removed.  After transplant, she was admitted due to a low blood count and received platelets twice.  When we saw her in September, she was found to have an E coli urinary infection, which was treated with Bactrim DS.  She was placed on Bactrim DS prophylaxis once daily on Mondays, Wednesdays, and Fridays in October for 3 months.   She was seen again at [facility] in October.  She is on a double blinded clinical trial evaluating ibrutinib versus placebo posttransplant and takes 4 capsules daily.  She saw Dr. Alphonsa [facility] in December for her day 100.  PET in February 2021 did not reveal any evidence of recurrent disease.  She has had continued routine imaging without evidence of recurrence. She had a CT neck, chest, abdomen and pelvis in February of 2023 at [facility] which did not reveal any evidence of recurrence.  Stable benign hepatic hemangioma was seen.   Colonic diverticulosis was seen.  Calcified uterine leiomyomas were seen.  She is up-to-date on screening mammogram with the last being in December 2022.  INTERVAL HISTORY:  [facility] is here today for repeat clinical assessment for stage IV high-grade diffuse large B-cell lymphoma. Patient states that she feels ok and informed me that she had a urine culture that came back positive for E. Coli and is fighting a recurrent UTI with some bladder pain. She just finished antibiotics and will follow-up with the urologist with repeat culture. She had a CT chest, abdomen, and pelvis with contrast on 05/01/2023 that revealed no findings of active malignancy, but did reveal mild cardiomegaly, mild aortic valve calcification, stable large cavernous hemangioma in the dome of the right hepatic lob, sigmoid colon diverticulosis, calcified uterine fibroids, lipomas of the gluteus maximus muscles bilaterally and stable grade 1 anterolisthesis at L4-5 with substantial lumbar spondylosis and degenerative disc disease. Her WBC is 8.9, hemoglobin 13.7, and platelet count at 214,000. Her CMP is normal. She requested a lipid panel but we did not have sufficient blood to do so. She returns back to [facility] in September  for her 4th year check up post bone marrow transplant, she will have her port and labs done then. I will send her documents to [facility]. I will see her back in 8 months with CBC, CMP, and plan port flush.She denies signs of infection such as sore throat, sinus drainage, cough, or urinary symptoms.  She denies fevers or recurrent chills. She denies pain. She denies nausea, vomiting, chest pain, dyspnea or cough. Her appetite is good and her weight has increased 2 pounds over last 3 months .  REVIEW OF SYSTEMS:  Review of Systems  Constitutional: Negative.  Negative for appetite change, chills, diaphoresis, fatigue, fever and unexpected weight change.  HENT:  Negative.  Negative for hearing loss, lump/mass, mouth  sores, nosebleeds, sore throat, tinnitus, trouble swallowing and voice change.   Eyes: Negative.  Negative for eye problems and icterus.  Respiratory:  Negative for chest tightness, cough, hemoptysis, shortness of breath and wheezing.   Cardiovascular: Negative.  Negative for chest pain, leg swelling and palpitations.  Gastrointestinal:  Negative for abdominal distention, abdominal pain, blood in stool, constipation, diarrhea, nausea, rectal pain and vomiting.  Endocrine: Negative.  Negative for hot flashes.  Genitourinary:  Positive for dysuria (E.coli positive and recurrent UTI's). Negative for bladder incontinence, difficulty urinating, dyspareunia, frequency, hematuria, menstrual problem, nocturia, pelvic pain, vaginal bleeding and vaginal discharge.   Musculoskeletal: Negative.  Negative for arthralgias, back pain, flank pain, gait problem, myalgias, neck pain and neck stiffness.  Skin: Negative.  Negative for itching, rash and wound.  Neurological: Negative.  Negative for dizziness, extremity weakness, gait problem, headaches, light-headedness, numbness, seizures and speech difficulty.  Hematological: Negative.  Negative for adenopathy. Does not bruise/bleed easily.  Psychiatric/Behavioral: Negative.  Negative for confusion, decreased concentration, depression, sleep disturbance and suicidal ideas. The patient is not nervous/anxious.      VITALS:  Blood pressure 130/82, pulse 72, temperature 97.9 F (36.6 C), temperature source Oral, resp. rate 16, height 4' 10.2" (1.478 m), weight 170 lb 4.8 oz (77.2 kg), SpO2 96 %.  Wt Readings from Last 3 Encounters:  05/03/23 170 lb 4.8 oz (77.2 kg)  02/02/23 168 lb 1.6 oz (76.2 kg)  11/02/22 173 lb 1.6 oz (78.5 kg)    Body mass index is 35.35 kg/m.  Performance status (ECOG): 0 - Asymptomatic  PHYSICAL EXAM:  Physical Exam Vitals and nursing note reviewed.  Constitutional:      General: She is not in acute distress.    Appearance: Normal  appearance. She is normal weight. She is not ill-appearing, toxic-appearing or diaphoretic.  HENT:     Head: Normocephalic and atraumatic.     Right Ear: Tympanic membrane, ear canal and external ear normal. There is no impacted cerumen.     Left Ear: Tympanic membrane, ear canal and external ear normal. There is no impacted cerumen.     Nose: Nose normal. No congestion or rhinorrhea.     Mouth/Throat:     Mouth: Mucous membranes are moist.     Pharynx: Oropharynx is clear. No oropharyngeal exudate or posterior oropharyngeal erythema.  Eyes:     General: No scleral icterus.       Right eye: No discharge.        Left eye: No discharge.     Extraocular Movements: Extraocular movements intact.     Conjunctiva/sclera: Conjunctivae normal.     Pupils: Pupils are equal, round, and reactive to light.  Neck:     Vascular: No carotid bruit.  Cardiovascular:  Rate and Rhythm: Normal rate and regular rhythm.     Pulses: Normal pulses.     Heart sounds: Normal heart sounds. No murmur heard.    No friction rub. No gallop.  Pulmonary:     Effort: Pulmonary effort is normal. No respiratory distress.     Breath sounds: Normal breath sounds. No stridor. No wheezing, rhonchi or rales.  Chest:     Chest wall: No tenderness.  Abdominal:     General: Bowel sounds are normal. There is no distension.     Palpations: Abdomen is soft. There is no hepatomegaly, splenomegaly or mass.     Tenderness: There is no abdominal tenderness. There is no right CVA tenderness, left CVA tenderness, guarding or rebound.     Hernia: No hernia is present.  Musculoskeletal:        General: No swelling, tenderness, deformity or signs of injury. Normal range of motion.     Cervical back: Normal range of motion and neck supple. No rigidity or tenderness.     Right lower leg: No edema.     Left lower leg: No edema.  Lymphadenopathy:     Cervical: No cervical adenopathy.     Right cervical: No superficial, deep or  posterior cervical adenopathy.    Left cervical: No superficial, deep or posterior cervical adenopathy.     Upper Body:     Right upper body: No supraclavicular, axillary or pectoral adenopathy.     Left upper body: No supraclavicular, axillary or pectoral adenopathy.     Lower Body: No right inguinal adenopathy. No left inguinal adenopathy.  Skin:    General: Skin is warm and dry.     Coloration: Skin is not jaundiced or pale.     Findings: No bruising, erythema, lesion or rash.  Neurological:     General: No focal deficit present.     Mental Status: She is alert and oriented to person, place, and time. Mental status is at baseline.     Cranial Nerves: No cranial nerve deficit.     Sensory: No sensory deficit.     Motor: No weakness.     Coordination: Coordination normal.     Gait: Gait normal.     Deep Tendon Reflexes: Reflexes normal.  Psychiatric:        Mood and Affect: Mood normal.        Behavior: Behavior normal.        Thought Content: Thought content normal.        Judgment: Judgment normal.     LABS:      Latest Ref Rng & Units 05/01/2023   12:00 AM 02/02/2023   10:10 AM 11/01/2022   12:00 AM  CBC  WBC  8.9     9.4  5.8      Hemoglobin 12.0 - 16.0 13.7     13.0  13.7      Hematocrit 36 - 46 40     38.9  40      Platelets 150 - 400 K/uL 214     272  220         This result is from an external source.      Latest Ref Rng & Units 05/01/2023   12:00 AM 02/02/2023   10:10 AM 11/01/2022   12:00 AM  CMP  Glucose 70 - 99 mg/dL  161    BUN 4 - 21 16     12  12       Creatinine  0.5 - 1.1 0.9     0.98  0.8      Sodium 137 - 147 141     140  139      Potassium 3.5 - 5.1 mEq/L 3.6     3.8  3.8      Chloride 99 - 108 109     106  109      CO2 13 - 22 23     25  23       Calcium 8.7 - 10.7 9.6     8.9  9.0      Total Protein 6.5 - 8.1 g/dL  6.6    Total Bilirubin 0.3 - 1.2 mg/dL  0.7    Alkaline Phos 25 - 125 61     78  79      AST 13 - 35 20     15  23       ALT 7 -  35 U/L 15     10  16          This result is from an external source.   Component Ref Range & Units 03/22/2023  Sodium 136 - 145 mmol/L 140  Potassium 3.4 - 4.5 mmol/L 3.5  Chloride 98 - 107 mmol/L 107  CO2 21 - 31 mmol/L 26  Anion Gap 6 - 14 mmol/L 7  Glucose, Random 70 - 99 mg/dL 161 High   Blood Urea Nitrogen (BUN) 7 - 25 mg/dL 12  Creatinine 0.96 - 0.45 mg/dL 4.09  eGFR >81 XB/JYN/8.29F6 61  Albumin 3.5 - 5.7 g/dL 4.1  Total Protein 6.4 - 8.9 g/dL 6.7  Bilirubin, Total 0.3 - 1.0 mg/dL 0.5  Alkaline Phosphatase (ALP) 34 - 104 U/L 63  Aspartate Aminotransferase (AST) 13 - 39 U/L 12 Low   Alanine Aminotransferase (ALT) 7 - 52 U/L 8  Calcium 8.6 - 10.3 mg/dL 9.1   Component Ref Range & Units 02/02/2023  Cholesterol 0 - 200 mg/dL 213 High   Triglycerides <150 mg/dL 086 High   HDL >57 mg/dL 35 Low   Total CHOL/HDL Ratio RATIO 6.5  VLDL 0 - 40 mg/dL 35  LDL Cholesterol 0 - 99 mg/dL 846 High     No results found for: "CEA1", "CEA" / No results found for: "CEA1", "CEA" No results found for: "PSA1" No results found for: "NGE952" No results found for: "WUX324"  Lab Results  Component Value Date   TOTALPROTELP 3.5 (L) 08/24/2017   Lab Results  Component Value Date   TIBC 214 (L) 08/21/2017   FERRITIN 745 (H) 08/21/2017   IRONPCTSAT 14 08/21/2017   Lab Results  Component Value Date   LDH 134 05/02/2021   LDH 136 11/02/2020   LDH 602 (H) 08/30/2017    STUDIES:  Exam: 05/01/2023 CT Chest, Abdomen, and Pelvis with Contrast Impression: No findings of active malignancy. Mild cardiomegaly, mild aortic valve calcification, stable large cavernous hemangioma in the dome of the right hepatic lob, sigmoid colon diverticulosis, calcified uterine fibroids, lipomas of the gluteus maximus muscles bilaterally and stable grade 1 anterolisthesis at L4-5 with substantial lumbar spondylosis and degenerative disc disease. Partial anomalous pulmonary venous return  from the lingula to the brachiocephalic vein. Aortic and systemic atherosclerosis. Aortic Atherosclerosis (ICD10-170.0)      HISTORY:   Past Medical History:  Diagnosis Date   Acute urinary retention 08/29/2017   AKI (acute kidney injury) (HCC) 08/20/2017   Aortic atherosclerosis (HCC)    Bilateral pleural effusion    Cataracts, bilateral  Cytopenia    DLBCL (diffuse large B cell lymphoma) (HCC) 09/03/2017   non hodkins lymphoma right kidney and ureter   Essential hypertension    hx of off bp meds sept 2, 2018 due to weight loss   Gout    Grade I diastolic dysfunction    Heart murmur    remote history   History of blood transfusion 02/2018   History of bronchitis    History of chemotherapy finsished chemo Dec 19, 2017   History of constipation    HLD (hyperlipidemia)    Hydronephrosis    Right secondary to bulky retroperitoneal lymphadenopathy   Hyperbilirubinemia 08/29/2017   Hypercalcemia 08/20/2017   Hypomagnesemia 08/29/2017   Hypophosphatemia 08/29/2017   Hypotension 08/20/2017   Localized swelling of lower extremity    left lower leg keeps elevated   Lymphadenopathy    Macrocytic anemia 08/21/2017   Nausea & vomiting 08/20/2017   Neuromuscular disorder (HCC)    neuropathy all over from chemo   Obesity    Port-A-Cath in place    Rheumatoid arthritis (HCC)    Right renal mass 08/2017   Rotator cuff tear    left   Sepsis (HCC) 08/20/2017   Splenomegaly    Thrombocytopenia (HCC)    Ureteral obstruction, right    Malignate    Past Surgical History:  Procedure Laterality Date   APPENDECTOMY  age 34   BONE MARROW BIOPSY  08/31/2017   BREAST SURGERY Right 30 yrs ago   benign   CARPAL TUNNEL RELEASE Bilateral    CATARACT EXTRACTION, BILATERAL     02/2018: right 03/2018 left   colonscopy     CYSTOSCOPY W/ URETERAL STENT PLACEMENT Right 02/18/2018   Procedure: CYSTOSCOPY WITH RETROGRADE PYELOGRAM/URETERAL STENT EXCHANGE;  Surgeon: Ihor Gully, MD;  Location: [facility];  Service: Urology;  Laterality: Right;   CYSTOSCOPY W/ URETERAL STENT PLACEMENT Right 08/02/2018   Procedure: CYSTOSCOPY WITH STENT REMOVAL AND REPLACEMENT;  Surgeon: Ihor Gully, MD;  Location: [facility] [place];  Service: Urology;  Laterality: Right;   CYSTOSCOPY W/ URETERAL STENT PLACEMENT Right 12/30/2018   Procedure: CYSTOSCOPY, RETROGRADE  WITH STENT REMOVAL;  Surgeon: Ihor Gully, MD;  Location: [facility] [place];  Service: Urology;  Laterality: Right;   IR FLUORO GUIDE PORT INSERTION RIGHT  09/06/2017   IR NEPHROSTOMY EXCHANGE RIGHT  08/31/2017   IR NEPHROSTOMY EXCHANGE RIGHT  09/11/2017   IR NEPHROSTOMY PLACEMENT RIGHT  08/21/2017   IR URETERAL STENT PLACEMENT EXISTING ACCESS RIGHT  11/19/2017   IR US GUIDE VASC ACCESS RIGHT  09/06/2017   LAPAROSCOPIC SALPINGO OOPHERECTOMY Right    Left supraclavicular lymph node biopsy  08/24/2017   retroperitioneal lymph node biopsy   09/02/2017   THORACENTESIS Left 09/20/2017    Family History  Problem Relation Age of Onset   CVA Mother     Social History:  reports that she quit smoking about 5 years ago. Her smoking use included cigarettes. She has a 25.00 pack-year smoking history. She has never used smokeless tobacco. She reports current alcohol use. She reports that she does not use drugs.The patient is alone.  Her labs are unremarkable so there everything is fair we will do but will work on her do maybe I did I will now today.  Allergies:  Allergies  Allergen Reactions   Midazolam Other (See Comments)    Cannot use versed per dr Charm Barges gi doctor  Other reaction(s): Other   Streptomycin Anaphylaxis, Other (See Comments) and  Swelling    Numb tongue, fingers and mouth  Other reaction(s): Other  NUMBMESS OF MOUTH, TONGUE AND HANDS   Other reaction(s): facial swelling    Current Medications: Current Outpatient Medications  Medication Sig Dispense Refill   simvastatin (ZOCOR) 20 MG tablet Take by  mouth. Three times a week     allopurinol (ZYLOPRIM) 300 MG tablet Take 300 mg by mouth as directed. Mon, Wed, Fri     Cholecalciferol 25 MCG (1000 UT) tablet Take by mouth. Mon, Wed, Fri     furosemide (LASIX) 20 MG tablet Take 20 mg by mouth as needed.     potassium chloride SA (KLOR-CON M) 20 MEQ tablet Take 20 mEq by mouth as needed. As needed after taking lasix     traMADol (ULTRAM) 50 MG tablet Take 1 tablet by mouth as needed.     valACYclovir (VALTREX) 500 MG tablet Take 500 mg by mouth 2 (two) times daily as needed.     No current facility-administered medications for this visit.      I,Jasmine M Lassiter,acting as a scribe for [facility]ina Beckwith, MD.,have documented all relevant documentation on the behalf of [facility] Beckwith, MD,as directed by  [facility] Beckwith, MD while in the presence of [facility]ina Beckwith, MD.

## 2023-05-03 ENCOUNTER — Encounter: Payer: Self-pay | Admitting: Oncology

## 2023-05-03 ENCOUNTER — Other Ambulatory Visit: Payer: Self-pay | Admitting: Oncology

## 2023-05-03 ENCOUNTER — Inpatient Hospital Stay: Payer: Medicare Other | Attending: Oncology | Admitting: Oncology

## 2023-05-03 VITALS — BP 130/82 | HR 72 | Temp 97.9°F | Resp 16 | Ht 58.2 in | Wt 170.3 lb

## 2023-05-03 DIAGNOSIS — C8338 Diffuse large B-cell lymphoma, lymph nodes of multiple sites: Secondary | ICD-10-CM

## 2023-05-03 DIAGNOSIS — E785 Hyperlipidemia, unspecified: Secondary | ICD-10-CM

## 2023-05-04 ENCOUNTER — Telehealth: Payer: Self-pay | Admitting: Oncology

## 2023-05-04 NOTE — Telephone Encounter (Signed)
05/04/23 Spoke with patient and scheduled next appt 

## 2023-05-08 DIAGNOSIS — Z1612 Extended spectrum beta lactamase (ESBL) resistance: Secondary | ICD-10-CM | POA: Diagnosis not present

## 2023-05-08 DIAGNOSIS — A449 Bartonellosis, unspecified: Secondary | ICD-10-CM | POA: Diagnosis not present

## 2023-05-09 DIAGNOSIS — Z1612 Extended spectrum beta lactamase (ESBL) resistance: Secondary | ICD-10-CM | POA: Diagnosis not present

## 2023-05-09 DIAGNOSIS — A449 Bartonellosis, unspecified: Secondary | ICD-10-CM | POA: Diagnosis not present

## 2023-05-10 DIAGNOSIS — Z1612 Extended spectrum beta lactamase (ESBL) resistance: Secondary | ICD-10-CM | POA: Diagnosis not present

## 2023-05-10 DIAGNOSIS — A449 Bartonellosis, unspecified: Secondary | ICD-10-CM | POA: Diagnosis not present

## 2023-05-11 DIAGNOSIS — Z1612 Extended spectrum beta lactamase (ESBL) resistance: Secondary | ICD-10-CM | POA: Diagnosis not present

## 2023-05-11 DIAGNOSIS — A449 Bartonellosis, unspecified: Secondary | ICD-10-CM | POA: Diagnosis not present

## 2023-05-12 DIAGNOSIS — A449 Bartonellosis, unspecified: Secondary | ICD-10-CM | POA: Diagnosis not present

## 2023-05-12 DIAGNOSIS — Z1612 Extended spectrum beta lactamase (ESBL) resistance: Secondary | ICD-10-CM | POA: Diagnosis not present

## 2023-05-15 DIAGNOSIS — N952 Postmenopausal atrophic vaginitis: Secondary | ICD-10-CM | POA: Diagnosis not present

## 2023-05-15 DIAGNOSIS — N390 Urinary tract infection, site not specified: Secondary | ICD-10-CM | POA: Diagnosis not present

## 2023-05-18 ENCOUNTER — Encounter: Payer: Self-pay | Admitting: Oncology

## 2023-09-07 DIAGNOSIS — Z006 Encounter for examination for normal comparison and control in clinical research program: Secondary | ICD-10-CM | POA: Diagnosis not present

## 2023-09-07 DIAGNOSIS — Z9484 Stem cells transplant status: Secondary | ICD-10-CM | POA: Diagnosis not present

## 2023-09-07 DIAGNOSIS — C8333 Diffuse large B-cell lymphoma, intra-abdominal lymph nodes: Secondary | ICD-10-CM | POA: Diagnosis not present

## 2023-09-28 DIAGNOSIS — Z23 Encounter for immunization: Secondary | ICD-10-CM | POA: Diagnosis not present

## 2023-11-29 DIAGNOSIS — D485 Neoplasm of uncertain behavior of skin: Secondary | ICD-10-CM | POA: Diagnosis not present

## 2023-11-29 DIAGNOSIS — D2239 Melanocytic nevi of other parts of face: Secondary | ICD-10-CM | POA: Diagnosis not present

## 2023-11-29 DIAGNOSIS — L814 Other melanin hyperpigmentation: Secondary | ICD-10-CM | POA: Diagnosis not present

## 2023-11-29 DIAGNOSIS — L820 Inflamed seborrheic keratosis: Secondary | ICD-10-CM | POA: Diagnosis not present

## 2023-12-07 DIAGNOSIS — Z1231 Encounter for screening mammogram for malignant neoplasm of breast: Secondary | ICD-10-CM | POA: Diagnosis not present

## 2023-12-07 LAB — HM MAMMOGRAPHY

## 2023-12-10 DIAGNOSIS — R197 Diarrhea, unspecified: Secondary | ICD-10-CM | POA: Diagnosis not present

## 2023-12-10 DIAGNOSIS — C8330 Diffuse large B-cell lymphoma, unspecified site: Secondary | ICD-10-CM | POA: Diagnosis not present

## 2023-12-10 DIAGNOSIS — Z1331 Encounter for screening for depression: Secondary | ICD-10-CM | POA: Diagnosis not present

## 2023-12-10 DIAGNOSIS — I700 Atherosclerosis of aorta: Secondary | ICD-10-CM | POA: Diagnosis not present

## 2023-12-10 DIAGNOSIS — E785 Hyperlipidemia, unspecified: Secondary | ICD-10-CM | POA: Diagnosis not present

## 2023-12-10 DIAGNOSIS — Z79899 Other long term (current) drug therapy: Secondary | ICD-10-CM | POA: Diagnosis not present

## 2023-12-10 DIAGNOSIS — Z7189 Other specified counseling: Secondary | ICD-10-CM | POA: Diagnosis not present

## 2023-12-10 DIAGNOSIS — M109 Gout, unspecified: Secondary | ICD-10-CM | POA: Diagnosis not present

## 2023-12-10 DIAGNOSIS — Z0000 Encounter for general adult medical examination without abnormal findings: Secondary | ICD-10-CM | POA: Diagnosis not present

## 2024-01-02 NOTE — Progress Notes (Signed)
[facility]  [address] [place],  [place]  [address] [phone]    Clinic Day: 01/03/2024  Referring physician: Philemon Kingdom, MD  ASSESSMENT & PLAN:  Assessment: DLBCL (diffuse large B cell lymphoma) (HCC) Recurrent diffuse large B-cell lymphoma, which was originally diagnosed in September 2018, and treated with 6 cycles of R-CHOP, as well as intrathecal chemotherapy.  She then relapsed in June 2020.  She received 2 cycles of R-ICE, which she tolerated well with an excellent response.  She was essentially in complete remission by PET scan, so underwent autologous stem cell transplant in September 2020, and is now 3 years out.  She completed her ibrutinib clinical trial in September 2021 and had her last follow-up visit in April, 2024.  PET scan in February 2021 remained negative.  CT scans and Labs 05/03/2023 show she remains in remission. She is due for follow-up at [facility] in September, which is annual now. She also has an appointment with Dr. Alphonsa Gin in March, 2025 but they have not scheduled scans so I will do so when it has been a year, in May, 2025.    Plan: She had a screening bilateral mammogram done on 12/07/2023 that was clear. She informed me that her PCP increased her Simvastatin 20mg  from 3 days a week to 5 days a week. She inquired if she could be a organ donor after she passes, I informed her that it is usual that people with malignancy cannot as there could be cancer cells left. However she still could donate her eyes. She has a WBC of 6.7, hemoglobin of 13.7, and platelet count of 185,000. Her CMP is normal. Her LDH today is pending. She will see her PCP in March. I will see her back in 4 months with CBC, CMP, LDH, and CT head, chest, abdomen, and pelvis; that will be 1 year from her last scans. The patient understands the plans discussed today and is in agreement with them.  She knows to contact our office if she  develops concerns prior to her next appointment.   I provided 17 minutes of face-to-face time during this encounter and > 50% was spent counseling as documented under my assessment and plan.   Dellia Beckwith, MD  [facility] AT [facility] [address] [place] [address] Dept: [phone] Dept Fax: [phone]   No orders of the defined types were placed in this encounter.   CHIEF COMPLAINT:  CC: Stage IV high-grade diffuse large B-cell lymphoma  Current Treatment: Observation  HISTORY OF PRESENT ILLNESS:  Kristina Torres is a 78 y.o. female with a history of stage IV high-grade diffuse large B-cell lymphoma initially treated in 2018 with R-CHOP and intrathecal methotrexate, completed in January of 2019. She had a fairly complicated course, but finally recovered.  She was found to have recurrent disease in June 2020.  PET scan was positive in the retroperitoneal adenopathy but not elsewhere.  She saw Dr. Alphonsa Gin at [facility] and she agreed that Kristina Torres was a candidate for autologous stem cell transplant.  We were unable to obtain a tissue confirmation as the only possible approach would be an open laparotomy.  Dr.  Georgiana Shore has reviewed the case and feels she would need to go to a tertiary care center for this surgery.  We felt the morbidity of that surgery and the delay in starting her treatment was not worth the additional information.  Dr. Alphonsa Gin agreed.  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  Dr. Alphonsa Gin recommended two cycles of R-ICE followed by a PET scan.  The patient had her first cycle on June 23rd, and she did very well with no nausea, bowel problems, hematuria, or fever.  With her first cycle of R-ICE, her white count did go down to 0.6 and her platelets went as low as 6,000, so she did receive a plateletpheresis.  She had no fever or infection.   She received platelet pheresis in July after her 2nd cycle of R-ICE.  She was seen on August 10th after 2 cycles of R-ICE.  Repeat PET scan, revealed a marked interval response to therapy of the intensely hypermetabolic retroperitoneal abdominal lymphadenopathy seen on the prior PET-CT, as well as revealed a stable size of a calcified precarinal lymph node that reveal low level hypermetabolism.  Bone marrow biopsy done the week prior showed hypercellularity with plenty of iron stores.  In preparation for stem cell collection, she underwent pegfilgrastim injections August 15th through the 17th and also received Mozobil, followed by stem cell collection.   She underwent stem cell transplant in September 2020.  A Hickman catheter was placed while she was admitted for transplant, but she went into ventricular tachycardia, so this was subsequently removed.  After transplant, she was admitted due to a low blood count and received platelets twice.  When we saw her in September, she was found to have an E coli urinary infection, which was treated with Bactrim DS.  She was placed on Bactrim DS prophylaxis once daily on Mondays, Wednesdays, and Fridays in October for 3 months.   She was seen again at [facility] in October.  She is on a double blinded clinical trial evaluating ibrutinib versus placebo posttransplant and takes 4 capsules daily.  She saw Dr. Alphonsa Gin in December for her day 100.  PET in February 2021 did not reveal any evidence of recurrent disease.  She has had continued routine imaging without evidence of recurrence. She had a CT neck, chest, abdomen and pelvis in February of 2023 at [facility] which did not reveal any evidence of recurrence.  Stable benign hepatic hemangioma was seen.  Colonic diverticulosis was seen.  Calcified uterine leiomyomas were seen.  She is up-to-date on screening mammogram with the last being in December 2022.  INTERVAL HISTORY:  Kristina Torres is here today for repeat clinical  assessment for stage IV high-grade diffuse large B-cell lymphoma. Patient states that she feels well and has no complaints of pain. She did have 1 previous fall and is well now. She had a screening bilateral mammogram done on 12/07/2023 that was clear. She informed me that her PCP increased her Simvastatin 20mg  from 3 days a week to 5 days a week. She inquired if she could be a organ donor after she passes, I informed her that it is usual that people with malignancy cannot as there could be cancer cells left. However she still could donate her eyes. She has a WBC of 6.7, hemoglobin of 13.7, and platelet count of 185,000. Her CMP is normal. Her LDH today is pending. She will see her PCP in March. I will see her back in 4 months with CBC, CMP, LDH, and CT head, chest, abdomen, and pelvis; that will be 1 year from her last scans.   She denies signs of infection such as sore throat, sinus drainage, cough, or urinary  symptoms.  She denies fevers or recurrent chills. She denies pain. She denies nausea, vomiting, chest pain, dyspnea or cough. Her appetite is great and her weight has increased 5 pounds over last year .  REVIEW OF SYSTEMS:  Review of Systems  Constitutional: Negative.  Negative for appetite change, chills, diaphoresis, fatigue, fever and unexpected weight change.  HENT:  Negative.  Negative for hearing loss, lump/mass, mouth sores, nosebleeds, sore throat, tinnitus, trouble swallowing and voice change.   Eyes: Negative.  Negative for eye problems and icterus.  Respiratory:  Negative for chest tightness, cough, hemoptysis, shortness of breath and wheezing.   Cardiovascular: Negative.  Negative for chest pain, leg swelling and palpitations.  Gastrointestinal:  Negative for abdominal distention, abdominal pain, blood in stool, constipation, diarrhea, nausea, rectal pain and vomiting.  Endocrine: Negative.  Negative for hot flashes.  Genitourinary:  Negative for bladder incontinence, difficulty  urinating, dyspareunia, dysuria, frequency, hematuria, menstrual problem, nocturia, pelvic pain, vaginal bleeding and vaginal discharge.   Musculoskeletal: Negative.  Negative for arthralgias, back pain, flank pain, gait problem, myalgias, neck pain and neck stiffness.  Skin: Negative.  Negative for itching, rash and wound.  Neurological: Negative.  Negative for dizziness, extremity weakness, gait problem, headaches, light-headedness, numbness, seizures and speech difficulty.  Hematological: Negative.  Negative for adenopathy. Does not bruise/bleed easily.  Psychiatric/Behavioral: Negative.  Negative for confusion, decreased concentration, depression, sleep disturbance and suicidal ideas. The patient is not nervous/anxious.      VITALS:  Blood pressure 138/72, pulse (!) 54, temperature 97.6 F (36.4 C), temperature source Oral, resp. rate 18, height 4' 10.2" (1.478 m), weight 175 lb 11.2 oz (79.7 kg), SpO2 97%.  Wt Readings from Last 3 Encounters:  01/03/24 175 lb 11.2 oz (79.7 kg)  05/03/23 170 lb 4.8 oz (77.2 kg)  02/02/23 168 lb 1.6 oz (76.2 kg)    Body mass index is 36.47 kg/m.  Performance status (ECOG): 0 - Asymptomatic  PHYSICAL EXAM:  Physical Exam Vitals and nursing note reviewed.  Constitutional:      General: She is not in acute distress.    Appearance: Normal appearance. She is normal weight. She is not ill-appearing, toxic-appearing or diaphoretic.  HENT:     Head: Normocephalic and atraumatic.     Right Ear: Tympanic membrane, ear canal and external ear normal. There is no impacted cerumen.     Left Ear: Tympanic membrane, ear canal and external ear normal. There is no impacted cerumen.     Nose: Nose normal. No congestion or rhinorrhea.     Mouth/Throat:     Mouth: Mucous membranes are moist.     Pharynx: Oropharynx is clear. No oropharyngeal exudate or posterior oropharyngeal erythema.  Eyes:     General: No scleral icterus.       Right eye: No discharge.         Left eye: No discharge.     Extraocular Movements: Extraocular movements intact.     Conjunctiva/sclera: Conjunctivae normal.     Pupils: Pupils are equal, round, and reactive to light.  Neck:     Vascular: No carotid bruit.  Cardiovascular:     Rate and Rhythm: Normal rate and regular rhythm.     Pulses: Normal pulses.     Heart sounds: Normal heart sounds. No murmur heard.    No friction rub. No gallop.  Pulmonary:     Effort: Pulmonary effort is normal. No respiratory distress.     Breath sounds: Normal breath sounds. No stridor.  No wheezing, rhonchi or rales.  Chest:     Chest wall: No tenderness.  Abdominal:     General: Bowel sounds are normal. There is no distension.     Palpations: Abdomen is soft. There is no hepatomegaly, splenomegaly or mass.     Tenderness: There is no abdominal tenderness. There is no right CVA tenderness, left CVA tenderness, guarding or rebound.     Hernia: No hernia is present.  Musculoskeletal:        General: No swelling, tenderness, deformity or signs of injury. Normal range of motion.     Cervical back: Normal range of motion and neck supple. No rigidity or tenderness.     Right lower leg: No edema.     Left lower leg: No edema.  Lymphadenopathy:     Cervical: No cervical adenopathy.     Right cervical: No superficial, deep or posterior cervical adenopathy.    Left cervical: No superficial, deep or posterior cervical adenopathy.     Upper Body:     Right upper body: No supraclavicular, axillary or pectoral adenopathy.     Left upper body: No supraclavicular, axillary or pectoral adenopathy.     Lower Body: No right inguinal adenopathy. No left inguinal adenopathy.     Comments: I can palpate a small fat pad in the right axilla. This was previously investigated and benign.   Skin:    General: Skin is warm and dry.     Coloration: Skin is not jaundiced or pale.     Findings: No bruising, erythema, lesion or rash.  Neurological:     General:  No focal deficit present.     Mental Status: She is alert and oriented to person, place, and time. Mental status is at baseline.     Cranial Nerves: No cranial nerve deficit.     Sensory: No sensory deficit.     Motor: No weakness.     Coordination: Coordination normal.     Gait: Gait normal.     Deep Tendon Reflexes: Reflexes normal.  Psychiatric:        Mood and Affect: Mood normal.        Behavior: Behavior normal.        Thought Content: Thought content normal.        Judgment: Judgment normal.     LABS:      Latest Ref Rng & Units 01/03/2024    9:52 AM 05/01/2023   12:00 AM 02/02/2023   10:10 AM  CBC  WBC 4.0 - 10.5 K/uL 6.7  8.9     9.4   Hemoglobin 12.0 - 15.0 g/dL 40.9  81.1     91.4   Hematocrit 36.0 - 46.0 % 39.1  40     38.9   Platelets 150 - 400 K/uL 185  214     272      This result is from an external source.      Latest Ref Rng & Units 01/03/2024    9:52 AM 05/01/2023   12:00 AM 02/02/2023   10:10 AM  CMP  Glucose 70 - 99 mg/dL 782   956   BUN 8 - 23 mg/dL 10  16     12    Creatinine 0.44 - 1.00 mg/dL 2.13  0.9     0.86   Sodium 135 - 145 mmol/L 142  141     140   Potassium 3.5 - 5.1 mmol/L 4.0  3.6     3.8  Chloride 98 - 111 mmol/L 106  109     106   CO2 22 - 32 mmol/L 23  23     25    Calcium 8.9 - 10.3 mg/dL 9.1  9.6     8.9   Total Protein 6.5 - 8.1 g/dL 6.5   6.6   Total Bilirubin 0.0 - 1.2 mg/dL 0.4   0.7   Alkaline Phos 38 - 126 U/L 78  61     78   AST 15 - 41 U/L 17  20     15    ALT 0 - 44 U/L 11  15     10       This result is from an external source.   Lab Results  Component Value Date   TSH 1.27 05/01/2023    No results found for: "CEA1", "CEA" / No results found for: "CEA1", "CEA" No results found for: "PSA1" No results found for: "CAN199" No results found for: "CAN125"  Lab Results  Component Value Date   TOTALPROTELP 3.5 (L) 08/24/2017   Lab Results  Component Value Date   TIBC 214 (L) 08/21/2017   FERRITIN 745 (H) 08/21/2017    IRONPCTSAT 14 08/21/2017   Lab Results  Component Value Date   LDH 172 01/03/2024   LDH 134 05/02/2021   LDH 136 11/02/2020    STUDIES:  Exam: 12/07/2023 Digital Screening Bilateral Mammogram with Tomosynthesis Impression: No mammographic evidence of malignancy.    HISTORY:   Past Medical History:  Diagnosis Date   Acute urinary retention 08/29/2017   AKI (acute kidney injury) (HCC) 08/20/2017   Aortic atherosclerosis (HCC)    Bilateral pleural effusion    Cataracts, bilateral    Cytopenia    DLBCL (diffuse large B cell lymphoma) (HCC) 09/03/2017   non hodkins lymphoma right kidney and ureter   Essential hypertension    hx of off bp meds sept 2, 2018 due to weight loss   Gout    Grade I diastolic dysfunction    Heart murmur    remote history   History of blood transfusion 02/2018   History of bronchitis    History of chemotherapy finsished chemo Dec 19, 2017   History of constipation    HLD (hyperlipidemia)    Hydronephrosis    Right secondary to bulky retroperitoneal lymphadenopathy   Hyperbilirubinemia 08/29/2017   Hypercalcemia 08/20/2017   Hypomagnesemia 08/29/2017   Hypophosphatemia 08/29/2017   Hypotension 08/20/2017   Localized swelling of lower extremity    left lower leg keeps elevated   Lymphadenopathy    Macrocytic anemia 08/21/2017   Nausea & vomiting 08/20/2017   Neuromuscular disorder (HCC)    neuropathy all over from chemo   Obesity    Port-A-Cath in place    Rheumatoid arthritis (HCC)    Right renal mass 08/2017   Rotator cuff tear    left   Sepsis (HCC) 08/20/2017   Splenomegaly    Thrombocytopenia (HCC)    Ureteral obstruction, right    Malignate    Past Surgical History:  Procedure Laterality Date   APPENDECTOMY  age 59   BONE MARROW BIOPSY  08/31/2017   BREAST SURGERY Right 30 yrs ago   benign   CARPAL TUNNEL RELEASE Bilateral    CATARACT EXTRACTION, BILATERAL     02/2018: right 03/2018 left   colonscopy     CYSTOSCOPY W/ URETERAL STENT  PLACEMENT Right 02/18/2018   Procedure: CYSTOSCOPY WITH RETROGRADE PYELOGRAM/URETERAL STENT EXCHANGE;  Surgeon: Ihor Gully,  MD;  Location: [facility];  Service: Urology;  Laterality: Right;   CYSTOSCOPY W/ URETERAL STENT PLACEMENT Right 08/02/2018   Procedure: CYSTOSCOPY WITH STENT REMOVAL AND REPLACEMENT;  Surgeon: Ihor Gully, MD;  Location: [facility];  Service: Urology;  Laterality: Right;   CYSTOSCOPY W/ URETERAL STENT PLACEMENT Right 12/30/2018   Procedure: CYSTOSCOPY, RETROGRADE  WITH STENT REMOVAL;  Surgeon: Ihor Gully, MD;  Location: [facility];  Service: Urology;  Laterality: Right;   IR FLUORO GUIDE PORT INSERTION RIGHT  09/06/2017   IR NEPHROSTOMY EXCHANGE RIGHT  08/31/2017   IR NEPHROSTOMY EXCHANGE RIGHT  09/11/2017   IR NEPHROSTOMY PLACEMENT RIGHT  08/21/2017   IR URETERAL STENT PLACEMENT EXISTING ACCESS RIGHT  11/19/2017   IR US GUIDE VASC ACCESS RIGHT  09/06/2017   LAPAROSCOPIC SALPINGO OOPHERECTOMY Right    Left supraclavicular lymph node biopsy  08/24/2017   retroperitioneal lymph node biopsy   09/02/2017   THORACENTESIS Left 09/20/2017    Family History  Problem Relation Age of Onset   CVA Mother     Social History:  reports that she quit smoking about 6 years ago. Her smoking use included cigarettes. She started smoking about 56 years ago. She has a 25 pack-year smoking history. She has never used smokeless tobacco. She reports current alcohol use. She reports that she does not use drugs.The patient is alone.  Her labs are unremarkable so there everything is fair we will do but will work on her do maybe I did I will now today.  Allergies:  Allergies  Allergen Reactions   Midazolam Other (See Comments)    Cannot use versed per dr Charm Barges gi doctor  Other reaction(s): Other   Streptomycin Anaphylaxis, Other (See Comments) and Swelling    Numb tongue, fingers and mouth  Other reaction(s): Other  NUMBMESS OF MOUTH,  TONGUE AND HANDS   Other reaction(s): facial swelling    Current Medications: Current Outpatient Medications  Medication Sig Dispense Refill   allopurinol (ZYLOPRIM) 300 MG tablet Take 300 mg by mouth as directed. Mon-Fri     Cholecalciferol 25 MCG (1000 UT) tablet Take by mouth. 5 days a week     furosemide (LASIX) 20 MG tablet Take 20 mg by mouth as needed.     potassium chloride SA (KLOR-CON M) 20 MEQ tablet Take 20 mEq by mouth as needed. As needed after taking lasix     simvastatin (ZOCOR) 20 MG tablet Take by mouth. 5 days a week     traMADol (ULTRAM) 50 MG tablet Take 1 tablet by mouth as needed.     valACYclovir (VALTREX) 500 MG tablet Take 500 mg by mouth 2 (two) times daily as needed.     No current facility-administered medications for this visit.     I,Jasmine M Lassiter,acting as a scribe for Dellia Beckwith, MD.,have documented all relevant documentation on the behalf of Dellia Beckwith, MD,as directed by  Dellia Beckwith, MD while in the presence of Dellia Beckwith, MD.

## 2024-01-03 ENCOUNTER — Inpatient Hospital Stay: Payer: Medicare Other

## 2024-01-03 ENCOUNTER — Encounter: Payer: Self-pay | Admitting: Oncology

## 2024-01-03 ENCOUNTER — Other Ambulatory Visit: Payer: Self-pay | Admitting: Oncology

## 2024-01-03 ENCOUNTER — Inpatient Hospital Stay: Payer: Medicare Other | Attending: Oncology | Admitting: Oncology

## 2024-01-03 VITALS — BP 138/72 | HR 54 | Temp 97.6°F | Resp 18 | Ht 58.2 in | Wt 175.7 lb

## 2024-01-03 DIAGNOSIS — C8338 Diffuse large B-cell lymphoma, lymph nodes of multiple sites: Secondary | ICD-10-CM

## 2024-01-03 DIAGNOSIS — C833A Diffuse large b-cell lymphoma, in remission: Secondary | ICD-10-CM | POA: Insufficient documentation

## 2024-01-03 DIAGNOSIS — Z9484 Stem cells transplant status: Secondary | ICD-10-CM | POA: Diagnosis not present

## 2024-01-03 DIAGNOSIS — Z87891 Personal history of nicotine dependence: Secondary | ICD-10-CM | POA: Insufficient documentation

## 2024-01-03 DIAGNOSIS — Z9221 Personal history of antineoplastic chemotherapy: Secondary | ICD-10-CM | POA: Insufficient documentation

## 2024-01-03 LAB — LACTATE DEHYDROGENASE: LDH: 172 U/L (ref 98–192)

## 2024-01-03 LAB — CBC WITH DIFFERENTIAL (CANCER CENTER ONLY)
Abs Immature Granulocytes: 0.03 10*3/uL (ref 0.00–0.07)
Basophils Absolute: 0.1 10*3/uL (ref 0.0–0.1)
Basophils Relative: 1 %
Eosinophils Absolute: 0.2 10*3/uL (ref 0.0–0.5)
Eosinophils Relative: 3 %
HCT: 39.1 % (ref 36.0–46.0)
Hemoglobin: 13.7 g/dL (ref 12.0–15.0)
Immature Granulocytes: 0 %
Lymphocytes Relative: 33 %
Lymphs Abs: 2.2 10*3/uL (ref 0.7–4.0)
MCH: 33.6 pg (ref 26.0–34.0)
MCHC: 35 g/dL (ref 30.0–36.0)
MCV: 95.8 fL (ref 80.0–100.0)
Monocytes Absolute: 0.5 10*3/uL (ref 0.1–1.0)
Monocytes Relative: 7 %
Neutro Abs: 3.8 10*3/uL (ref 1.7–7.7)
Neutrophils Relative %: 56 %
Platelet Count: 185 10*3/uL (ref 150–400)
RBC: 4.08 MIL/uL (ref 3.87–5.11)
RDW: 12.9 % (ref 11.5–15.5)
WBC Count: 6.7 10*3/uL (ref 4.0–10.5)
nRBC: 0 % (ref 0.0–0.2)
nRBC: 0 /100{WBCs}

## 2024-01-03 LAB — CMP (CANCER CENTER ONLY)
ALT: 11 U/L (ref 0–44)
AST: 17 U/L (ref 15–41)
Albumin: 4.2 g/dL (ref 3.5–5.0)
Alkaline Phosphatase: 78 U/L (ref 38–126)
Anion gap: 13 (ref 5–15)
BUN: 10 mg/dL (ref 8–23)
CO2: 23 mmol/L (ref 22–32)
Calcium: 9.1 mg/dL (ref 8.9–10.3)
Chloride: 106 mmol/L (ref 98–111)
Creatinine: 0.98 mg/dL (ref 0.44–1.00)
GFR, Estimated: 59 mL/min — ABNORMAL LOW (ref >60)
Glucose, Bld: 123 mg/dL — ABNORMAL HIGH (ref 70–99)
Potassium: 4 mmol/L (ref 3.5–5.1)
Sodium: 142 mmol/L (ref 135–145)
Total Bilirubin: 0.4 mg/dL (ref 0.0–1.2)
Total Protein: 6.5 g/dL (ref 6.5–8.1)

## 2024-01-10 ENCOUNTER — Encounter: Payer: Self-pay | Admitting: Oncology

## 2024-03-26 DIAGNOSIS — Z79899 Other long term (current) drug therapy: Secondary | ICD-10-CM | POA: Diagnosis not present

## 2024-03-26 DIAGNOSIS — M109 Gout, unspecified: Secondary | ICD-10-CM | POA: Diagnosis not present

## 2024-03-26 DIAGNOSIS — E785 Hyperlipidemia, unspecified: Secondary | ICD-10-CM | POA: Diagnosis not present

## 2024-04-09 DIAGNOSIS — C8333 Diffuse large B-cell lymphoma, intra-abdominal lymph nodes: Secondary | ICD-10-CM | POA: Diagnosis not present

## 2024-04-09 DIAGNOSIS — Z9484 Stem cells transplant status: Secondary | ICD-10-CM | POA: Diagnosis not present

## 2024-04-24 ENCOUNTER — Ambulatory Visit (HOSPITAL_BASED_OUTPATIENT_CLINIC_OR_DEPARTMENT_OTHER)
Admission: RE | Admit: 2024-04-24 | Discharge: 2024-04-24 | Disposition: A | Discharge status: Home / Self Care | Source: Ambulatory Visit | Attending: Oncology | Admitting: Oncology

## 2024-04-24 ENCOUNTER — Inpatient Hospital Stay: Attending: Oncology

## 2024-04-24 DIAGNOSIS — C833A Diffuse large b-cell lymphoma, in remission: Secondary | ICD-10-CM | POA: Insufficient documentation

## 2024-04-24 DIAGNOSIS — Z9221 Personal history of antineoplastic chemotherapy: Secondary | ICD-10-CM | POA: Diagnosis not present

## 2024-04-24 DIAGNOSIS — Q263 Partial anomalous pulmonary venous connection: Secondary | ICD-10-CM | POA: Insufficient documentation

## 2024-04-24 DIAGNOSIS — C8338 Diffuse large B-cell lymphoma, lymph nodes of multiple sites: Secondary | ICD-10-CM

## 2024-04-24 DIAGNOSIS — Z9484 Stem cells transplant status: Secondary | ICD-10-CM | POA: Insufficient documentation

## 2024-04-24 DIAGNOSIS — C8590 Non-Hodgkin lymphoma, unspecified, unspecified site: Secondary | ICD-10-CM | POA: Diagnosis not present

## 2024-04-24 DIAGNOSIS — I700 Atherosclerosis of aorta: Secondary | ICD-10-CM | POA: Diagnosis not present

## 2024-04-24 DIAGNOSIS — Z87891 Personal history of nicotine dependence: Secondary | ICD-10-CM | POA: Diagnosis not present

## 2024-04-24 DIAGNOSIS — D1803 Hemangioma of intra-abdominal structures: Secondary | ICD-10-CM | POA: Diagnosis not present

## 2024-04-24 DIAGNOSIS — K5730 Diverticulosis of large intestine without perforation or abscess without bleeding: Secondary | ICD-10-CM | POA: Diagnosis not present

## 2024-04-24 DIAGNOSIS — C8330 Diffuse large B-cell lymphoma, unspecified site: Secondary | ICD-10-CM | POA: Diagnosis not present

## 2024-04-24 LAB — CBC WITH DIFFERENTIAL (CANCER CENTER ONLY)
Abs Immature Granulocytes: 0.01 10*3/uL (ref 0.00–0.07)
Basophils Absolute: 0.1 10*3/uL (ref 0.0–0.1)
Basophils Relative: 1 %
Eosinophils Absolute: 0.2 10*3/uL (ref 0.0–0.5)
Eosinophils Relative: 2 %
HCT: 39.6 % (ref 36.0–46.0)
Hemoglobin: 13.2 g/dL (ref 12.0–15.0)
Immature Granulocytes: 0 %
Lymphocytes Relative: 34 %
Lymphs Abs: 2.7 10*3/uL (ref 0.7–4.0)
MCH: 32.8 pg (ref 26.0–34.0)
MCHC: 33.3 g/dL (ref 30.0–36.0)
MCV: 98.5 fL (ref 80.0–100.0)
Monocytes Absolute: 0.6 10*3/uL (ref 0.1–1.0)
Monocytes Relative: 7 %
Neutro Abs: 4.4 10*3/uL (ref 1.7–7.7)
Neutrophils Relative %: 56 %
Platelet Count: 178 10*3/uL (ref 150–400)
RBC: 4.02 MIL/uL (ref 3.87–5.11)
RDW: 13.2 % (ref 11.5–15.5)
WBC Count: 7.9 10*3/uL (ref 4.0–10.5)
nRBC: 0 % (ref 0.0–0.2)

## 2024-04-24 LAB — CMP (CANCER CENTER ONLY)
ALT: 6 U/L (ref 0–44)
AST: 10 U/L — ABNORMAL LOW (ref 15–41)
Albumin: 3.9 g/dL (ref 3.5–5.0)
Alkaline Phosphatase: 78 U/L (ref 38–126)
Anion gap: 12 (ref 5–15)
BUN: 13 mg/dL (ref 8–23)
CO2: 24 mmol/L (ref 22–32)
Calcium: 9.4 mg/dL (ref 8.9–10.3)
Chloride: 106 mmol/L (ref 98–111)
Creatinine: 0.93 mg/dL (ref 0.44–1.00)
GFR, Estimated: >60 mL/min (ref >60)
Glucose, Bld: 118 mg/dL — ABNORMAL HIGH (ref 70–99)
Potassium: 3.5 mmol/L (ref 3.5–5.1)
Sodium: 142 mmol/L (ref 135–145)
Total Bilirubin: 0.4 mg/dL (ref 0.0–1.2)
Total Protein: 6.3 g/dL — ABNORMAL LOW (ref 6.5–8.1)

## 2024-04-24 LAB — LACTATE DEHYDROGENASE: LDH: 154 U/L (ref 98–192)

## 2024-04-24 MED ORDER — IOHEXOL 300 MG/ML  SOLN
100.0000 mL | Freq: Once | INTRAMUSCULAR | Status: AC | PRN
  Administered 2024-04-24: 100 mL via INTRAVENOUS

## 2024-04-24 MED ORDER — HEPARIN SOD (PORK) LOCK FLUSH 100 UNIT/ML IV SOLN
500.0000 [IU] | Freq: Once | INTRAVENOUS | Status: AC
  Administered 2024-04-24: 500 [IU] via INTRAVENOUS

## 2024-04-29 ENCOUNTER — Other Ambulatory Visit (HOSPITAL_BASED_OUTPATIENT_CLINIC_OR_DEPARTMENT_OTHER): Payer: Medicare Other | Admitting: Radiology

## 2024-04-29 ENCOUNTER — Other Ambulatory Visit: Payer: Medicare Other

## 2024-05-01 NOTE — Progress Notes (Signed)
 Kristina Torres  [address] [place],  [place]  [address] [phone] Clinic Day: 05/02/24  Referring physician: Olan Bering, MD  ASSESSMENT & PLAN:  Assessment: DLBCL (diffuse large B cell lymphoma) (HCC) Recurrent diffuse large B-cell lymphoma, which was originally diagnosed in September 2018, and treated with 6 cycles of R-CHOP, as well as intrathecal chemotherapy.  She then relapsed in June 2020.  She received 2 cycles of R-ICE, which she tolerated well with an excellent response.  She was essentially in complete remission by PET scan, so underwent autologous stem cell transplant in September 2020, and is now 3 years out.  She completed her ibrutinib clinical trial in September 2021 and had her last follow-up visit in April, 2024.  PET scan in February 2021 remained negative.  CT scans and Labs 05/03/2023 show she remains in remission. CT chest, abdomen, and pelvis done on 04/24/2024 revealed for no evidence of lymphadenopathy or metastatic disease in the chest, abdomen, or pelvis with, an unchanged, large, and partially calcified hemangioma in the central liver dome.    Plan: She had a CT chest, abdomen, and pelvis done on 04/24/2024 that revealed for no evidence of lymphadenopathy or metastatic disease in the chest, abdomen, or pelvis, an unchanged, large, partially calcified hemangioma in the central liver dome, descending and sigmoid diverticulosis without evidence of acute diverticulitis, and calcified uterine fibroids. 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 She also had a CT head done the same day and report is pending. We reviewed the images together and it looks normal. Patient informed me that she was released from [facility] in April. She was told she did not have to follow-up unless any issues occurred and she will have her last survivorship meeting in September. She had labs done last  week and had a WBC of 7.9, hemoglobin of 13.2, and platelet count of 178,000. Her CMP was normal other than a low-normal total protein of 6.3 and a normal LDH of 154. Her potassium was low-normal at 3.5 and I instructed her to take 1 oral potassium of 10meq once daily for 1-2 months. I will see her back in 6 months with CBC, CMP, LDH, and port flush. The patient understands the plans discussed today and is in agreement with them.  She knows to contact our office if she develops concerns prior to her next appointment.   I provided 25 minutes of face-to-face time during this encounter and > 50% was spent counseling as documented under my assessment and plan.   Kristina Baumgartner, MD  [facility] DE[facility] OF [facility] [address] [place] [address] Dept: [phone] Dept Fax: [phone]   No orders of the defined types were placed in this encounter.  CHIEF COMPLAINT:  CC: Stage IV high-grade diffuse large B-cell lymphoma  Current Treatment: Observation  HISTORY OF PRESENT ILLNESS:  Kristina Torres is a 78 y.o. female with a history of stage IV high-grade diffuse large B-cell lymphoma initially treated in 2018 with R-CHOP and intrathecal methotrexate , completed in January of 2019. She had a fairly complicated course, but finally recovered.  She was found to have recurrent disease in June 2020.  PET scan was positive in the retroperitoneal adenopathy but not elsewhere.  She saw Dr. Vaidya at [facility] and she agreed that Kristina Torres was a candidate for autologous stem cell transplant.  We were unable to obtain a tissue confirmation as the only possible approach would be an open laparotomy.  Dr.  Jonita Torres has reviewed the case and feels she would need to go to a tertiary care center for this surgery.  We felt the morbidity of that surgery and the delay in starting her treatment was not worth the additional  information.  Dr. Vaidya agreed.  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  Dr. Vaidya recommended two cycles of R-ICE followed by a PET scan.  The patient had her first cycle on June 23rd, and she did very well with no nausea, bowel problems, hematuria, or fever.  With her first cycle of R-ICE, her white count did go down to 0.6 and her platelets went as low as 6,000, so she did receive a plateletpheresis.  She had no fever or infection.  She received platelet pheresis in July after her 2nd cycle of R-ICE.  She was seen on August 10th after 2 cycles of R-ICE.  Repeat PET scan, revealed a marked interval response to therapy of the intensely hypermetabolic retroperitoneal abdominal lymphadenopathy seen on the prior PET-CT, as well as revealed a stable size of a calcified precarinal lymph node that reveal low level hypermetabolism.  Bone marrow biopsy done the week prior showed hypercellularity with plenty of iron stores.  In preparation for stem cell collection, she underwent pegfilgrastim  injections August 15th through the 17th and also received Mozobil , followed by stem cell collection.   She underwent stem cell transplant in September 2020.  A Hickman catheter was placed while she was admitted for transplant, but she went into ventricular tachycardia, so this was subsequently removed.  After transplant, she was admitted due to a low blood count and received platelets twice.  When we saw her in September, she was found to have an E coli urinary infection, which was treated with Bactrim DS.  She was placed on Bactrim DS prophylaxis once daily on Mondays, Wednesdays, and Fridays in October for 3 months.   She was seen again at [facility] in October.  She is on a double blinded clinical trial evaluating ibrutinib versus placebo posttransplant and takes 4 capsules daily.  She saw Dr. Vaidya in December for her day 100.  PET in February 2021 did not reveal any evidence  of recurrent disease.  She has had continued routine imaging without evidence of recurrence. She had a CT neck, chest, abdomen and pelvis in February of 2023 at [facility] which did not reveal any evidence of recurrence.  Stable benign hepatic hemangioma was seen.  Colonic diverticulosis was seen.  Calcified uterine leiomyomas were seen.  She is up-to-date on screening mammogram with the last being in December 2022.  INTERVAL HISTORY:  Kristina Torres is here today for repeat clinical assessment for stage IV high-grade diffuse large B-cell lymphoma. Patient states that she feels well but complains of persistent neuropathy of the legs. She had a CT chest, abdomen, and pelvis done on 04/24/2024 that revealed for no evidence of lymphadenopathy or metastatic disease in the chest, abdomen, or pelvis, an unchanged, large, partially calcified hemangioma in the central liver dome, descending and sigmoid diverticulosis without evidence of acute diverticulitis, and calcified uterine fibroids. 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 She also had a CT head done the same day and report is pending. We reviewed the images together and  it looks normal. Patient informed me that she was released from [facility] in April. She was told she did not have to follow-up unless any issues occurred and she will have her last survivorship meeting in September. She had labs done last week and had a WBC of 7.9, hemoglobin of 13.2, and platelet count of 178,000. Her CMP was normal other than a low-normal total protein of 6.3 and a normal LDH of 154. Her potassium was low-normal at 3.5 and I instructed her to take 1 oral potassium of 10meq once daily for 1-2 months. I will see her back in 6 months with CBC, CMP, LDH, and port flush. She wishes to keep her port for now. She denies fever, chills, night sweats, or other signs of infection. She denies cardiorespiratory  and gastrointestinal issues.  Her appetite is good and her weight has decreased 4 pounds over last 4 months.   REVIEW OF SYSTEMS:  Review of Systems  Constitutional: Negative.  Negative for appetite change, chills, diaphoresis, fatigue, fever and unexpected weight change.  HENT:  Negative.  Negative for hearing loss, lump/mass, mouth sores, nosebleeds, sore throat, tinnitus, trouble swallowing and voice change.   Eyes: Negative.  Negative for eye problems and icterus.  Respiratory:  Negative for chest tightness, cough, hemoptysis, shortness of breath and wheezing.   Cardiovascular:  Positive for leg swelling (left foot). Negative for chest pain and palpitations.  Gastrointestinal:  Negative for abdominal distention, abdominal pain, blood in stool, constipation, diarrhea, nausea, rectal pain and vomiting.  Endocrine: Negative.  Negative for hot flashes.  Genitourinary:  Negative for bladder incontinence, difficulty urinating, dyspareunia, dysuria, frequency, hematuria, menstrual problem, nocturia, pelvic pain, vaginal bleeding and vaginal discharge.   Musculoskeletal: Negative.  Negative for arthralgias, back pain, flank pain, gait problem, myalgias, neck pain and neck stiffness.  Skin: Negative.  Negative for itching, rash and wound.  Neurological:  Positive for numbness (bilateral legs). Negative for dizziness, extremity weakness, gait problem, headaches, light-headedness, seizures and speech difficulty.  Hematological: Negative.  Negative for adenopathy. Does not bruise/bleed easily.  Psychiatric/Behavioral: Negative.  Negative for confusion, decreased concentration, depression, sleep disturbance and suicidal ideas. The patient is not nervous/anxious.      VITALS:  Blood pressure (!) 142/76, pulse 65, temperature 97.7 F (36.5 C), temperature source Oral, resp. rate 18, height 4' 10.2" (1.478 m), weight 171 lb 8 oz (77.8 kg), SpO2 100%.  Wt Readings from Last 3 Encounters:  05/02/24 171 lb 8  oz (77.8 kg)  01/03/24 175 lb 11.2 oz (79.7 kg)  05/03/23 170 lb 4.8 oz (77.2 kg)    Body mass index is 35.6 kg/m.  Performance status (ECOG): 0 - Asymptomatic  PHYSICAL EXAM:  Physical Exam Vitals and nursing note reviewed.  Constitutional:      General: She is not in acute distress.    Appearance: Normal appearance. She is normal weight. She is not ill-appearing, toxic-appearing or diaphoretic.  HENT:     Head: Normocephalic and atraumatic.     Right Ear: Tympanic membrane, ear canal and external ear normal. There is no impacted cerumen.     Left Ear: Tympanic membrane, ear canal and external ear normal. There is no impacted cerumen.     Nose: Nose normal. No congestion or rhinorrhea.     Mouth/Throat:     Mouth: Mucous membranes are moist.     Pharynx: Oropharynx is clear. No oropharyngeal exudate or posterior oropharyngeal erythema.  Eyes:     General: No scleral icterus.  Right eye: No discharge.        Left eye: No discharge.     Extraocular Movements: Extraocular movements intact.     Conjunctiva/sclera: Conjunctivae normal.     Pupils: Pupils are equal, round, and reactive to light.  Neck:     Vascular: No carotid bruit.  Cardiovascular:     Rate and Rhythm: Normal rate and regular rhythm.     Pulses: Normal pulses.     Heart sounds: Normal heart sounds. No murmur heard.    No friction rub. No gallop.  Pulmonary:     Effort: Pulmonary effort is normal. No respiratory distress.     Breath sounds: Normal breath sounds. No stridor. No wheezing, rhonchi or rales.  Chest:     Chest wall: No tenderness.  Abdominal:     General: Bowel sounds are normal. There is no distension.     Palpations: Abdomen is soft. There is no hepatomegaly, splenomegaly or mass.     Tenderness: There is no abdominal tenderness. There is no right CVA tenderness, left CVA tenderness, guarding or rebound.     Hernia: No hernia is present.  Musculoskeletal:        General: No swelling,  tenderness, deformity or signs of injury. Normal range of motion.     Cervical back: Normal range of motion and neck supple. No rigidity or tenderness.     Right lower leg: No edema.     Left lower leg: 1+ Edema present.  Lymphadenopathy:     Cervical: No cervical adenopathy.     Right cervical: No superficial, deep or posterior cervical adenopathy.    Left cervical: No superficial, deep or posterior cervical adenopathy.     Upper Body:     Right upper body: No supraclavicular, axillary or pectoral adenopathy.     Left upper body: No supraclavicular, axillary or pectoral adenopathy.     Lower Body: No right inguinal adenopathy. No left inguinal adenopathy.  Skin:    General: Skin is warm and dry.     Coloration: Skin is not jaundiced or pale.     Findings: No bruising, erythema, lesion or rash.  Neurological:     General: No focal deficit present.     Mental Status: She is alert and oriented to person, place, and time. Mental status is at baseline.     Cranial Nerves: No cranial nerve deficit.     Sensory: No sensory deficit.     Motor: No weakness.     Coordination: Coordination normal.     Gait: Gait normal.     Deep Tendon Reflexes: Reflexes normal.  Psychiatric:        Mood and Affect: Mood normal.        Behavior: Behavior normal.        Thought Content: Thought content normal.        Judgment: Judgment normal.     LABS:      Latest Ref Rng & Units 04/24/2024    3:15 PM 01/03/2024    9:52 AM 05/01/2023   12:00 AM  CBC  WBC 4.0 - 10.5 K/uL 7.9  6.7  8.9      Hemoglobin 12.0 - 15.0 g/dL 09.8  11.9  14.7      Hematocrit 36.0 - 46.0 % 39.6  39.1  40      Platelets 150 - 400 K/uL 178  185  214         This result is from an external source.  Latest Ref Rng & Units 04/24/2024    3:15 PM 01/03/2024    9:52 AM 05/01/2023   12:00 AM  CMP  Glucose 70 - 99 mg/dL 161  096    BUN 8 - 23 mg/dL 13  10  16       Creatinine 0.44 - 1.00 mg/dL 0.45  4.09  0.9      Sodium 135 -  145 mmol/L 142  142  141      Potassium 3.5 - 5.1 mmol/L 3.5  4.0  3.6      Chloride 98 - 111 mmol/L 106  106  109      CO2 22 - 32 mmol/L 24  23  23       Calcium  8.9 - 10.3 mg/dL 9.4  9.1  9.6      Total Protein 6.5 - 8.1 g/dL 6.3  6.5    Total Bilirubin 0.0 - 1.2 mg/dL 0.4  0.4    Alkaline Phos 38 - 126 U/L 78  78  61      AST 15 - 41 U/L 10  17  20       ALT 0 - 44 U/L 6  11  15          This result is from an external source.   Lab Results  Component Value Date   TSH 1.27 05/01/2023    No results found for: "CEA1", "CEA" / No results found for: "CEA1", "CEA" No results found for: "PSA1" No results found for: "CAN199" No results found for: "CAN125"  Lab Results  Component Value Date   TOTALPROTELP 3.5 (L) 08/24/2017   Lab Results  Component Value Date   TIBC 214 (L) 08/21/2017   FERRITIN 745 (H) 08/21/2017   IRONPCTSAT 14 08/21/2017   Lab Results  Component Value Date   LDH 154 04/24/2024   LDH 172 01/03/2024   LDH 134 05/02/2021    STUDIES:   EXAM: 04/24/2024 CT HEAD WITH & WITHOUT CONTRAST IMPRESSION: Pending result  EXAM: 04/24/2024 CT CHEST, ABDOMEN, AND PELVIS WITH CONTRAST IMPRESSION: 1. No evidence of lymphadenopathy or metastatic disease in the chest, abdomen, or pelvis. 2. Unchanged, large, partially calcified hemangioma in the central liver dome. 3. Descending and sigmoid diverticulosis without evidence of acute diverticulitis. 4. Calcified uterine fibroids. 5. 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   HISTORY:   Past Medical History:  Diagnosis Date   Acute urinary retention 08/29/2017   AKI (acute kidney injury) (HCC) 08/20/2017   Aortic atherosclerosis (HCC)    Bilateral pleural effusion    Cataracts, bilateral    Cytopenia    DLBCL (diffuse large B cell lymphoma) (HCC) 09/03/2017   non hodkins lymphoma right kidney and ureter   Essential hypertension    hx of off  bp meds sept 2, 2018 due to weight loss   Gout    Grade I diastolic dysfunction    Heart murmur    remote history   History of blood transfusion 02/2018   History of bronchitis    History of chemotherapy finsished chemo Dec 19, 2017   History of constipation    HLD (hyperlipidemia)    Hydronephrosis    Right secondary to bulky retroperitoneal lymphadenopathy   Hyperbilirubinemia 08/29/2017   Hypercalcemia 08/20/2017   Hypomagnesemia 08/29/2017   Hypophosphatemia 08/29/2017   Hypotension 08/20/2017   Localized swelling of lower extremity    left lower leg keeps elevated   Lymphadenopathy  Macrocytic anemia 08/21/2017   Nausea & vomiting 08/20/2017   Neuromuscular disorder (HCC)    neuropathy all over from chemo   Obesity    Port-A-Cath in place    Rheumatoid arthritis (HCC)    Right renal mass 08/2017   Rotator cuff tear    left   Sepsis (HCC) 08/20/2017   Splenomegaly    Thrombocytopenia (HCC)    Ureteral obstruction, right    Malignate    Past Surgical History:  Procedure Laterality Date   APPENDECTOMY  age 35   BONE MARROW BIOPSY  08/31/2017   BREAST SURGERY Right 30 yrs ago   benign   CARPAL TUNNEL RELEASE Bilateral    CATARACT EXTRACTION, BILATERAL     02/2018: right 03/2018 left   colonscopy     CYSTOSCOPY W/ URETERAL STENT PLACEMENT Right 02/18/2018   Procedure: CYSTOSCOPY WITH RETROGRADE PYELOGRAM/URETERAL STENT EXCHANGE;  Surgeon: Ottelin, Mark, MD;  Location: [facility];  Service: Urology;  Laterality: Right;   CYSTOSCOPY W/ URETERAL STENT PLACEMENT Right 08/02/2018   Procedure: CYSTOSCOPY WITH STENT REMOVAL AND REPLACEMENT;  Surgeon: Ottelin, Mark, MD;  Location: [facility];  Service: Urology;  Laterality: Right;   CYSTOSCOPY W/ URETERAL STENT PLACEMENT Right 12/30/2018   Procedure: CYSTOSCOPY, RETROGRADE  WITH STENT REMOVAL;  Surgeon: Ottelin, Mark, MD;  Location: [facility];  Service: Urology;  Laterality: Right;   IR  FLUORO GUIDE PORT INSERTION RIGHT  09/06/2017   IR NEPHROSTOMY EXCHANGE RIGHT  08/31/2017   IR NEPHROSTOMY EXCHANGE RIGHT  09/11/2017   IR NEPHROSTOMY PLACEMENT RIGHT  08/21/2017   IR URETERAL STENT PLACEMENT EXISTING ACCESS RIGHT  11/19/2017   IR US  GUIDE VASC ACCESS RIGHT  09/06/2017   LAPAROSCOPIC SALPINGO OOPHERECTOMY Right    Left supraclavicular lymph node biopsy  08/24/2017   retroperitioneal lymph node biopsy   09/02/2017   THORACENTESIS Left 09/20/2017    Family History  Problem Relation Age of Onset   CVA Mother     Social History:  reports that she quit smoking about 6 years ago. Her smoking use included cigarettes. She started smoking about 56 years ago. She has a 25 pack-year smoking history. She has never used smokeless tobacco. She reports current alcohol  use. She reports that she does not use drugs.The patient is alone.  Her labs are unremarkable so there everything is fair we will do but will work on her do maybe I did I will now today.  Allergies:  Allergies  Allergen Reactions   Midazolam  Other (See Comments)    Cannot use versed  per dr Randal Bury gi doctor  Other reaction(s): Other   Streptomycin Anaphylaxis, Other (See Comments) and Swelling    Numb tongue, fingers and mouth  Other reaction(s): Other  NUMBMESS OF MOUTH, TONGUE AND HANDS   Other reaction(s): facial swelling    Current Medications: Current Outpatient Medications  Medication Sig Dispense Refill   diphenoxylate -atropine  (LOMOTIL ) 2.5-0.025 MG tablet Take 1 tablet by mouth 4 (four) times daily as needed.     allopurinol  (ZYLOPRIM ) 300 MG tablet Take 300 mg by mouth as directed. Mon-Fri     Cholecalciferol 25 MCG (1000 UT) tablet Take by mouth. 5 days a week     furosemide  (LASIX ) 20 MG tablet Take 20 mg by mouth as needed.     potassium chloride  (KLOR-CON  M) 10 MEQ tablet Take 1 tablet (10 mEq total) by mouth 2 (two) times daily. 30 tablet 1   simvastatin  (ZOCOR ) 20 MG tablet  Take by mouth. 5 days a  week     traMADol  (ULTRAM ) 50 MG tablet Take 1 tablet by mouth as needed.     valACYclovir  (VALTREX ) 500 MG tablet Take 500 mg by mouth 2 (two) times daily as needed.     No current facility-administered medications for this visit.     I,Jasmine M Lassiter,acting as a scribe for Kristina Baumgartner, MD.,have documented all relevant documentation on the behalf of Kristina Baumgartner, MD,as directed by  Kristina Baumgartner, MD while in the presence of Kristina Baumgartner, MD.

## 2024-05-02 ENCOUNTER — Inpatient Hospital Stay (HOSPITAL_BASED_OUTPATIENT_CLINIC_OR_DEPARTMENT_OTHER): Payer: Medicare Other | Admitting: Oncology

## 2024-05-02 ENCOUNTER — Encounter: Payer: Self-pay | Admitting: Oncology

## 2024-05-02 ENCOUNTER — Other Ambulatory Visit: Payer: Self-pay | Admitting: Oncology

## 2024-05-02 VITALS — BP 142/76 | HR 65 | Temp 97.7°F | Resp 18 | Ht 58.2 in | Wt 171.5 lb

## 2024-05-02 DIAGNOSIS — C8338 Diffuse large B-cell lymphoma, lymph nodes of multiple sites: Secondary | ICD-10-CM

## 2024-05-02 DIAGNOSIS — Q263 Partial anomalous pulmonary venous connection: Secondary | ICD-10-CM | POA: Diagnosis not present

## 2024-05-02 DIAGNOSIS — Z9221 Personal history of antineoplastic chemotherapy: Secondary | ICD-10-CM | POA: Diagnosis not present

## 2024-05-02 DIAGNOSIS — C833A Diffuse large b-cell lymphoma, in remission: Secondary | ICD-10-CM | POA: Diagnosis not present

## 2024-05-02 DIAGNOSIS — E876 Hypokalemia: Secondary | ICD-10-CM

## 2024-05-02 DIAGNOSIS — Z9484 Stem cells transplant status: Secondary | ICD-10-CM | POA: Diagnosis not present

## 2024-05-02 DIAGNOSIS — Z87891 Personal history of nicotine dependence: Secondary | ICD-10-CM | POA: Diagnosis not present

## 2024-05-02 MED ORDER — POTASSIUM CHLORIDE CRYS ER 10 MEQ PO TBCR: 10.0000 meq | EXTENDED_RELEASE_TABLET | Freq: Two times a day (BID) | ORAL | 1 refills | Status: DC

## 2024-05-09 ENCOUNTER — Telehealth: Payer: Self-pay

## 2024-05-09 ENCOUNTER — Other Ambulatory Visit: Payer: Self-pay | Admitting: Oncology

## 2024-05-09 ENCOUNTER — Other Ambulatory Visit: Payer: Self-pay | Admitting: Hematology and Oncology

## 2024-05-09 DIAGNOSIS — E876 Hypokalemia: Secondary | ICD-10-CM

## 2024-05-09 NOTE — Telephone Encounter (Signed)
-----   Message from Nurse Rosalia Colonel E sent at 05/09/2024 10:07 AM EDT ----- When last office visit  is signed off please send notes labs and scans to Dr Dewey Fordyce also per patient thanks.

## 2024-05-09 NOTE — Telephone Encounter (Signed)
 Office note and labs faxed via [facility] to Dr.Vaidya.

## 2024-05-09 NOTE — Telephone Encounter (Signed)
 Patient requesting Potassium to be sent in in capsule form  not tablet, her previous capsule strength was Potassium chloride  ER 10meq capsule once daily. You probally can't see this because it wasn't recent an old script from her PCP. Thanks suggested OTC as you recommended she did not want to do that.

## 2024-05-11 ENCOUNTER — Encounter: Payer: Self-pay | Admitting: Oncology

## 2024-06-23 ENCOUNTER — Other Ambulatory Visit: Payer: Self-pay | Admitting: Oncology

## 2024-06-23 DIAGNOSIS — E876 Hypokalemia: Secondary | ICD-10-CM

## 2024-07-02 ENCOUNTER — Other Ambulatory Visit: Payer: Self-pay | Admitting: Hematology and Oncology

## 2024-07-02 DIAGNOSIS — E876 Hypokalemia: Secondary | ICD-10-CM

## 2024-07-25 ENCOUNTER — Other Ambulatory Visit: Payer: Self-pay | Admitting: Hematology and Oncology

## 2024-07-25 DIAGNOSIS — E876 Hypokalemia: Secondary | ICD-10-CM

## 2024-08-19 ENCOUNTER — Other Ambulatory Visit: Payer: Self-pay | Admitting: Hematology and Oncology

## 2024-08-19 DIAGNOSIS — C8338 Diffuse large B-cell lymphoma, lymph nodes of multiple sites: Secondary | ICD-10-CM

## 2024-08-20 ENCOUNTER — Inpatient Hospital Stay

## 2024-08-20 ENCOUNTER — Other Ambulatory Visit: Payer: Self-pay

## 2024-08-20 ENCOUNTER — Encounter: Payer: Self-pay | Admitting: Hematology and Oncology

## 2024-08-20 ENCOUNTER — Inpatient Hospital Stay: Attending: Hematology and Oncology | Admitting: Hematology and Oncology

## 2024-08-20 ENCOUNTER — Ambulatory Visit (HOSPITAL_BASED_OUTPATIENT_CLINIC_OR_DEPARTMENT_OTHER)
Admission: RE | Admit: 2024-08-20 | Discharge: 2024-08-20 | Disposition: A | Discharge status: Home / Self Care | Source: Ambulatory Visit | Attending: Family Medicine | Admitting: Radiology

## 2024-08-20 VITALS — BP 150/80

## 2024-08-20 DIAGNOSIS — Z9484 Stem cells transplant status: Secondary | ICD-10-CM | POA: Diagnosis not present

## 2024-08-20 DIAGNOSIS — C8338 Diffuse large B-cell lymphoma, lymph nodes of multiple sites: Secondary | ICD-10-CM

## 2024-08-20 DIAGNOSIS — Z9221 Personal history of antineoplastic chemotherapy: Secondary | ICD-10-CM | POA: Diagnosis not present

## 2024-08-20 DIAGNOSIS — D1803 Hemangioma of intra-abdominal structures: Secondary | ICD-10-CM | POA: Diagnosis not present

## 2024-08-20 DIAGNOSIS — Z87891 Personal history of nicotine dependence: Secondary | ICD-10-CM | POA: Insufficient documentation

## 2024-08-20 DIAGNOSIS — C833A Diffuse large b-cell lymphoma, in remission: Secondary | ICD-10-CM | POA: Diagnosis present

## 2024-08-20 DIAGNOSIS — Q263 Partial anomalous pulmonary venous connection: Secondary | ICD-10-CM | POA: Insufficient documentation

## 2024-08-20 LAB — CMP (CANCER CENTER ONLY)
ALT: 14 U/L (ref 0–44)
AST: 18 U/L (ref 15–41)
Albumin: 4.3 g/dL (ref 3.5–5.0)
Alkaline Phosphatase: 77 U/L (ref 38–126)
Anion gap: 11 (ref 5–15)
BUN: 16 mg/dL (ref 8–23)
CO2: 23 mmol/L (ref 22–32)
Calcium: 9.7 mg/dL (ref 8.9–10.3)
Chloride: 105 mmol/L (ref 98–111)
Creatinine: 0.93 mg/dL (ref 0.44–1.00)
GFR, Estimated: >60 mL/min (ref >60)
Glucose, Bld: 102 mg/dL — ABNORMAL HIGH (ref 70–99)
Potassium: 4 mmol/L (ref 3.5–5.1)
Sodium: 139 mmol/L (ref 135–145)
Total Bilirubin: 0.6 mg/dL (ref 0.0–1.2)
Total Protein: 6.9 g/dL (ref 6.5–8.1)

## 2024-08-20 LAB — CBC WITH DIFFERENTIAL (CANCER CENTER ONLY)
Abs Immature Granulocytes: 0.02 K/uL (ref 0.00–0.07)
Basophils Absolute: 0.1 K/uL (ref 0.0–0.1)
Basophils Relative: 1 %
Eosinophils Absolute: 0.1 K/uL (ref 0.0–0.5)
Eosinophils Relative: 2 %
HCT: 39.6 % (ref 36.0–46.0)
Hemoglobin: 13.4 g/dL (ref 12.0–15.0)
Immature Granulocytes: 0 %
Lymphocytes Relative: 33 %
Lymphs Abs: 2.7 K/uL (ref 0.7–4.0)
MCH: 33.5 pg (ref 26.0–34.0)
MCHC: 33.8 g/dL (ref 30.0–36.0)
MCV: 99 fL (ref 80.0–100.0)
Monocytes Absolute: 0.5 K/uL (ref 0.1–1.0)
Monocytes Relative: 7 %
Neutro Abs: 4.7 K/uL (ref 1.7–7.7)
Neutrophils Relative %: 57 %
Platelet Count: 206 K/uL (ref 150–400)
RBC: 4 MIL/uL (ref 3.87–5.11)
RDW: 12.9 % (ref 11.5–15.5)
WBC Count: 8.1 K/uL (ref 4.0–10.5)
nRBC: 0 % (ref 0.0–0.2)

## 2024-08-20 LAB — LACTATE DEHYDROGENASE: LDH: 161 U/L (ref 98–192)

## 2024-08-20 NOTE — Progress Notes (Unsigned)
 [facility]  [address] [place],  [place]  [address] [phone] Clinic Day: 05/02/24  Referring physician: Jefferey Fitch, MD  ASSESSMENT & PLAN:  Assessment: DLBCL (diffuse large B cell lymphoma) (HCC) Recurrent diffuse large B-cell lymphoma, which was originally diagnosed in September 2018, and treated with 6 cycles of R-CHOP, as well as intrathecal chemotherapy.  She then relapsed in June 2020.  She received 2 cycles of R-ICE, which she tolerated well with an excellent response.  She was essentially in complete remission by PET scan, so underwent autologous stem cell transplant in September 2020, and is now 3 years out.  She completed her ibrutinib clinical trial in September 2021 and had her last follow-up visit in April, 2024.  PET scan in February 2021 remained negative.  CT scans and Labs 05/03/2023 show she remains in remission. CT chest, abdomen, and pelvis done on 04/24/2024 revealed for no evidence of lymphadenopathy or metastatic disease in the chest, abdomen, or pelvis with, an unchanged, large, and partially calcified hemangioma in the central liver dome.   Visit today for newly found knot on right clavicle. She states she has not noticed this in the past and wanted it evaluated. As noted above, scans from 04/24/2024 were negative. She denies any signs or symptoms of recurrent disease and exam today reveals no lymphadenopathy.    Plan: She had a CT chest, abdomen, and pelvis done on 04/24/2024 that revealed for no evidence of lymphadenopathy or metastatic disease in the chest, abdomen, or pelvis, an unchanged, large, partially calcified hemangioma in the central liver dome, descending and sigmoid diverticulosis without evidence of acute diverticulitis, and calcified uterine fibroids. 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 Patient informed me that she was released from [facility]  in April. She was told she did not have to follow-up unless any issues occurred and she will have her last survivorship meeting in September. LDH is 161 today and we will send for US  of this new area on the right clavicle.    Ultrasound today reveals Nonspecific ovoid hypoechoic 1.8 x 0.8 cm structure likely communicates with the sternoclavicular joint, raising concern for joint effusion or bursal collection. Degenerative synovial thickening was seen on prior CT. This could be further assessed with MRI as clinically indicated.   No lymphadenopathy on provided images.  We will arrange for ultrasound guided biopsy of the area.    The patient understands the plans discussed today and is in agreement with them.  She knows to contact our office if she develops concerns prior to her next appointment.   I provided 30 minutes of face-to-face time during this encounter and > 50% was spent counseling as documented under my assessment and plan.   Eleanor Bach, FNP- [facility] - A DEPT OF [facility] [address] [place] [address] Dept: [phone] Dept Fax: [phone]   No orders of the defined types were placed in this encounter.  CHIEF COMPLAINT:  CC: Stage IV high-grade diffuse large B-cell lymphoma  Current Treatment: Observation  HISTORY OF PRESENT ILLNESS:  Kristina Torres is a 78 y.o. female with a history of stage IV high-grade diffuse large B-cell lymphoma initially treated in 2018 with R-CHOP and intrathecal methotrexate , completed in January of 2019. She had a fairly complicated course, but finally recovered.  She was found to have recurrent disease in June 2020.  PET scan was positive in the retroperitoneal adenopathy but not elsewhere.  She saw Dr. Vaidya at [facility] and she agreed that Tatiana was a candidate for autologous stem cell transplant.  We were unable to obtain a tissue  confirmation as the only possible approach would be an open laparotomy.  Dr.  Bert has reviewed the case and feels she would need to go to a tertiary care center for this surgery.  We felt the morbidity of that surgery and the delay in starting her treatment was not worth the additional information.  Dr. Vaidya agreed.  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  Dr. Vaidya recommended two cycles of R-ICE followed by a PET scan.  The patient had her first cycle on June 23rd, and she did very well with no nausea, bowel problems, hematuria, or fever.  With her first cycle of R-ICE, her white count did go down to 0.6 and her platelets went as low as 6,000, so she did receive a plateletpheresis.  She had no fever or infection.  She received platelet pheresis in July after her 2nd cycle of R-ICE.  She was seen on August 10th after 2 cycles of R-ICE.  Repeat PET scan, revealed a marked interval response to therapy of the intensely hypermetabolic retroperitoneal abdominal lymphadenopathy seen on the prior PET-CT, as well as revealed a stable size of a calcified precarinal lymph node that reveal low level hypermetabolism.  Bone marrow biopsy done the week prior showed hypercellularity with plenty of iron stores.  In preparation for stem cell collection, she underwent pegfilgrastim  injections August 15th through the 17th and also received Mozobil , followed by stem cell collection.   She underwent stem cell transplant in September 2020.  A Hickman catheter was placed while she was admitted for transplant, but she went into ventricular tachycardia, so this was subsequently removed.  After transplant, she was admitted due to a low blood count and received platelets twice.  When we saw her in September, she was found to have an E coli urinary infection, which was treated with Bactrim DS.  She was placed on Bactrim DS prophylaxis once daily on Mondays, Wednesdays, and  Fridays in October for 3 months.   She was seen [facility] at [facility] in October.  She is on a double blinded clinical trial evaluating ibrutinib versus placebo posttransplant and takes 4 capsules daily.  She saw Dr. Vaidya in December for her day 100.  PET in February 2021 did not reveal any evidence of recurrent disease.  She has had continued routine imaging without evidence of recurrence. She had a CT neck, chest, abdomen and pelvis in February of 2023 at [facility] which did not reveal any evidence of recurrence.  Stable benign hepatic hemangioma was seen.  Colonic diverticulosis was seen.  Calcified uterine leiomyomas were seen.  She is up-to-date on screening mammogram with the last being in December 2022.  INTERVAL HISTORY:  Kristina Torres is here today for evaluation of new knot on her right clavicle. She first noticed this a couple of weeks ago. CT imaging from May 2025 revealed no evidence of disease or lymphadenopathy. She denies fever, chills, night sweats, or other signs of infection. She denies cardiorespiratory and gastrointestinal issues.  Her appetite is good and her weight has decreased 4 pounds over last 4 months.   REVIEW OF SYSTEMS:  Review of Systems  Constitutional: Negative.  Negative for appetite change,  chills, diaphoresis, fatigue, fever and unexpected weight change.  HENT:  Negative.  Negative for hearing loss, lump/mass, mouth sores, nosebleeds, sore throat, tinnitus, trouble swallowing and voice change.   Eyes: Negative.  Negative for eye problems and icterus.  Respiratory:  Negative for chest tightness, cough, hemoptysis, shortness of breath and wheezing.   Cardiovascular:  Positive for leg swelling (left foot). Negative for chest pain and palpitations.  Gastrointestinal:  Negative for abdominal distention, abdominal pain, blood in stool, constipation, diarrhea, nausea, rectal pain and vomiting.  Endocrine: Negative.  Negative for hot flashes.  Genitourinary:  Negative for  bladder incontinence, difficulty urinating, dyspareunia, dysuria, frequency, hematuria, menstrual problem, nocturia, pelvic pain, vaginal bleeding and vaginal discharge.   Musculoskeletal: Negative.  Negative for arthralgias, back pain, flank pain, gait problem, myalgias, neck pain and neck stiffness.  Skin: Negative.  Negative for itching, rash and wound.  Neurological:  Positive for numbness (bilateral legs). Negative for dizziness, extremity weakness, gait problem, headaches, light-headedness, seizures and speech difficulty.  Hematological: Negative.  Negative for adenopathy. Does not bruise/bleed easily.  Psychiatric/Behavioral: Negative.  Negative for confusion, decreased concentration, depression, sleep disturbance and suicidal ideas. The patient is not nervous/anxious.      VITALS:  There were no vitals taken for this visit.  Wt Readings from Last 3 Encounters:  05/02/24 171 lb 8 oz (77.8 kg)  01/03/24 175 lb 11.2 oz (79.7 kg)  05/03/23 170 lb 4.8 oz (77.2 kg)    There is no height or weight on file to calculate BMI.  Performance status (ECOG): 0 - Asymptomatic  PHYSICAL EXAM:  Physical Exam Vitals and nursing note reviewed.  Constitutional:      General: She is not in acute distress.    Appearance: Normal appearance. She is normal weight. She is not ill-appearing, toxic-appearing or diaphoretic.  HENT:     Head: Normocephalic and atraumatic.     Right Ear: Tympanic membrane, ear canal and external ear normal. There is no impacted cerumen.     Left Ear: Tympanic membrane, ear canal and external ear normal. There is no impacted cerumen.     Nose: Nose normal. No congestion or rhinorrhea.     Mouth/Throat:     Mouth: Mucous membranes are moist.     Pharynx: Oropharynx is clear. No oropharyngeal exudate or posterior oropharyngeal erythema.  Eyes:     General: No scleral icterus.       Right eye: No discharge.        Left eye: No discharge.     Extraocular Movements:  Extraocular movements intact.     Conjunctiva/sclera: Conjunctivae normal.     Pupils: Pupils are equal, round, and reactive to light.  Neck:     Vascular: No carotid bruit.  Cardiovascular:     Rate and Rhythm: Normal rate and regular rhythm.     Pulses: Normal pulses.     Heart sounds: Normal heart sounds. No murmur heard.    No friction rub. No gallop.  Pulmonary:     Effort: Pulmonary effort is normal. No respiratory distress.     Breath sounds: Normal breath sounds. No stridor. No wheezing, rhonchi or rales.  Chest:     Chest wall: No tenderness.  Abdominal:     General: Bowel sounds are normal. There is no distension.     Palpations: Abdomen is soft. There is no hepatomegaly, splenomegaly or mass.     Tenderness: There is no abdominal tenderness. There is no right CVA tenderness, left CVA  tenderness, guarding or rebound.     Hernia: No hernia is present.  Musculoskeletal:        General: No swelling, tenderness, deformity or signs of injury. Normal range of motion.     Cervical back: Normal range of motion and neck supple. No rigidity or tenderness.     Right lower leg: No edema.     Left lower leg: 1+ Edema present.     Comments: 1 cm firm nodule noted on the right clavicle.  Lymphadenopathy:     Cervical: No cervical adenopathy.     Right cervical: No superficial, deep or posterior cervical adenopathy.    Left cervical: No superficial, deep or posterior cervical adenopathy.     Upper Body:     Right upper body: No supraclavicular, axillary or pectoral adenopathy.     Left upper body: No supraclavicular, axillary or pectoral adenopathy.     Lower Body: No right inguinal adenopathy. No left inguinal adenopathy.  Skin:    General: Skin is warm and dry.     Coloration: Skin is not jaundiced or pale.     Findings: No bruising, erythema, lesion or rash.  Neurological:     General: No focal deficit present.     Mental Status: She is alert and oriented to person, place, and  time. Mental status is at baseline.     Cranial Nerves: No cranial nerve deficit.     Sensory: No sensory deficit.     Motor: No weakness.     Coordination: Coordination normal.     Gait: Gait normal.     Deep Tendon Reflexes: Reflexes normal.  Psychiatric:        Mood and Affect: Mood normal.        Behavior: Behavior normal.        Thought Content: Thought content normal.        Judgment: Judgment normal.     LABS:      Latest Ref Rng & Units 04/24/2024    3:15 PM 01/03/2024    9:52 AM 05/01/2023   12:00 AM  CBC  WBC 4.0 - 10.5 K/uL 7.9  6.7  8.9      Hemoglobin 12.0 - 15.0 g/dL 86.7  86.2  86.2      Hematocrit 36.0 - 46.0 % 39.6  39.1  40      Platelets 150 - 400 K/uL 178  185  214         This result is from an external source.      Latest Ref Rng & Units 04/24/2024    3:15 PM 01/03/2024    9:52 AM 05/01/2023   12:00 AM  CMP  Glucose 70 - 99 mg/dL 881  876    BUN 8 - 23 mg/dL 13  10  16       Creatinine 0.44 - 1.00 mg/dL 9.06  9.01  0.9      Sodium 135 - 145 mmol/L 142  142  141      Potassium 3.5 - 5.1 mmol/L 3.5  4.0  3.6      Chloride 98 - 111 mmol/L 106  106  109      CO2 22 - 32 mmol/L 24  23  23       Calcium  8.9 - 10.3 mg/dL 9.4  9.1  9.6      Total Protein 6.5 - 8.1 g/dL 6.3  6.5    Total Bilirubin 0.0 - 1.2 mg/dL 0.4  0.4  Alkaline Phos 38 - 126 U/L 78  78  61      AST 15 - 41 U/L 10  17  20       ALT 0 - 44 U/L 6  11  15          This result is from an external source.   Lab Results  Component Value Date   TSH 1.27 05/01/2023    No results found for: CEA1, CEA / No results found for: CEA1, CEA No results found for: PSA1 No results found for: CAN199 No results found for: RJW874  Lab Results  Component Value Date   TOTALPROTELP 3.5 (L) 08/24/2017   Lab Results  Component Value Date   TIBC 214 (L) 08/21/2017   FERRITIN 745 (H) 08/21/2017   IRONPCTSAT 14 08/21/2017   Lab Results  Component Value Date   LDH 154 04/24/2024   LDH  172 01/03/2024   LDH 134 05/02/2021    STUDIES:   EXAM: 04/24/2024 CT HEAD WITH & WITHOUT CONTRAST IMPRESSION: Pending result  EXAM: 04/24/2024 CT CHEST, ABDOMEN, AND PELVIS WITH CONTRAST IMPRESSION: 1. No evidence of lymphadenopathy or metastatic disease in the chest, abdomen, or pelvis. 2. Unchanged, large, partially calcified hemangioma in the central liver dome. 3. Descending and sigmoid diverticulosis without evidence of acute diverticulitis. 4. Calcified uterine fibroids. 5. 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   HISTORY:   Past Medical History:  Diagnosis Date   Acute urinary retention 08/29/2017   AKI (acute kidney injury) (HCC) 08/20/2017   Aortic atherosclerosis (HCC)    Bilateral pleural effusion    Cataracts, bilateral    Cytopenia    DLBCL (diffuse large B cell lymphoma) (HCC) 09/03/2017   non hodkins lymphoma right kidney and ureter   Essential hypertension    hx of off bp meds sept 2, 2018 due to weight loss   Gout    Grade I diastolic dysfunction    Heart murmur    remote history   History of blood transfusion 02/2018   History of bronchitis    History of chemotherapy finsished chemo Dec 19, 2017   History of constipation    HLD (hyperlipidemia)    Hydronephrosis    Right secondary to bulky retroperitoneal lymphadenopathy   Hyperbilirubinemia 08/29/2017   Hypercalcemia 08/20/2017   Hypomagnesemia 08/29/2017   Hypophosphatemia 08/29/2017   Hypotension 08/20/2017   Localized swelling of lower extremity    left lower leg keeps elevated   Lymphadenopathy    Macrocytic anemia 08/21/2017   Nausea & vomiting 08/20/2017   Neuromuscular disorder (HCC)    neuropathy all over from chemo   Obesity    Port-A-Cath in place    Rheumatoid arthritis (HCC)    Right renal mass 08/2017   Rotator cuff tear    left   Sepsis (HCC) 08/20/2017   Splenomegaly    Thrombocytopenia (HCC)    Ureteral  obstruction, right    Malignate    Past Surgical History:  Procedure Laterality Date   APPENDECTOMY  age 22   BONE MARROW BIOPSY  08/31/2017   BREAST SURGERY Right 30 yrs ago   benign   CARPAL TUNNEL RELEASE Bilateral    CATARACT EXTRACTION, BILATERAL     02/2018: right 03/2018 left   colonscopy     CYSTOSCOPY W/ URETERAL STENT PLACEMENT Right 02/18/2018   Procedure: CYSTOSCOPY WITH RETROGRADE PYELOGRAM/URETERAL STENT EXCHANGE;  Surgeon: Ottelin, Mark, MD;  Location: [facility];  Service: Urology;  Laterality: Right;   CYSTOSCOPY W/ URETERAL STENT PLACEMENT Right 08/02/2018   Procedure: CYSTOSCOPY WITH STENT REMOVAL AND REPLACEMENT;  Surgeon: Ottelin, Mark, MD;  Location: [facility];  Service: Urology;  Laterality: Right;   CYSTOSCOPY W/ URETERAL STENT PLACEMENT Right 12/30/2018   Procedure: CYSTOSCOPY, RETROGRADE  WITH STENT REMOVAL;  Surgeon: Ottelin, Mark, MD;  Location: [facility];  Service: Urology;  Laterality: Right;   IR FLUORO GUIDE PORT INSERTION RIGHT  09/06/2017   IR NEPHROSTOMY EXCHANGE RIGHT  08/31/2017   IR NEPHROSTOMY EXCHANGE RIGHT  09/11/2017   IR NEPHROSTOMY PLACEMENT RIGHT  08/21/2017   IR URETERAL STENT PLACEMENT EXISTING ACCESS RIGHT  11/19/2017   IR US  GUIDE VASC ACCESS RIGHT  09/06/2017   LAPAROSCOPIC SALPINGO OOPHERECTOMY Right    Left supraclavicular lymph node biopsy  08/24/2017   retroperitioneal lymph node biopsy   09/02/2017   THORACENTESIS Left 09/20/2017    Family History  Problem Relation Age of Onset   CVA Mother     Social History:  reports that she quit smoking about 7 years ago. Her smoking use included cigarettes. She started smoking about 57 years ago. She has a 25 pack-year smoking history. She has never used smokeless tobacco. She reports current alcohol  use. She reports that she does not use drugs.The patient is alone.  Her labs are unremarkable so there everything is fair we will do but will work on her  do maybe I did I will now today.  Allergies:  Allergies  Allergen Reactions   Midazolam  Other (See Comments)    Cannot use versed  per dr towana gi doctor  Other reaction(s): Other   Streptomycin Anaphylaxis, Other (See Comments) and Swelling    Numb tongue, fingers and mouth  Other reaction(s): Other  NUMBMESS OF MOUTH, TONGUE AND HANDS   Other reaction(s): facial swelling    Current Medications: Current Outpatient Medications  Medication Sig Dispense Refill   allopurinol  (ZYLOPRIM ) 300 MG tablet Take 300 mg by mouth as directed. Mon-Fri     Cholecalciferol 25 MCG (1000 UT) tablet Take by mouth. 5 days a week     potassium chloride  (MICRO-K ) 10 MEQ CR capsule TAKE 1 CAPSULE BY MOUTH EVERY DAY 90 capsule 1   simvastatin  (ZOCOR ) 20 MG tablet Take by mouth. 5 days a week     valACYclovir  (VALTREX ) 500 MG tablet Take 500 mg by mouth 2 (two) times daily as needed.     diphenoxylate -atropine  (LOMOTIL ) 2.5-0.025 MG tablet Take 1 tablet by mouth 4 (four) times daily as needed. (Patient not taking: Reported on 08/20/2024)     furosemide  (LASIX ) 20 MG tablet Take 20 mg by mouth as needed. (Patient not taking: Reported on 08/20/2024)     traMADol  (ULTRAM ) 50 MG tablet Take 1 tablet by mouth as needed. (Patient not taking: Reported on 08/20/2024)     No current facility-administered medications for this visit.

## 2024-08-21 ENCOUNTER — Encounter: Payer: Self-pay | Admitting: Oncology

## 2024-08-21 ENCOUNTER — Other Ambulatory Visit: Payer: Self-pay | Admitting: Hematology and Oncology

## 2024-08-21 DIAGNOSIS — C8338 Diffuse large B-cell lymphoma, lymph nodes of multiple sites: Secondary | ICD-10-CM

## 2024-08-22 NOTE — Progress Notes (Unsigned)
 Jenna Cordella LABOR, MD  Baldwin Rosella L Pt would need MRI of region prior to consideration of biopsy.       Previous Messages    ----- Message ----- From: Baldwin Rosella CROME Sent: 08/21/2024   1:12 PM EDT To: Rosella CROME Baldwin; Ir Procedure Requests Subject: CT US  GUIDED BIOPSY                            Procedure :CT US  GUIDED BIOPSY  Reason :Right clavicular nodule Dx: Diffuse large B-cell lymphoma of lymph nodes of multiple regions (HCC) [C83.38 (ICD-10-CM)]    History :US  SOFT TISSUE HEAD & NECK (NON-THYROID ),CT CHEST ABDOMEN PELVIS W CONTRAST,CT HEAD W & WO CONTRAST ( )  Provider: Harl Eleanor LABOR, NP  Provider contact ;  [phone]

## 2024-08-25 ENCOUNTER — Other Ambulatory Visit: Payer: Self-pay | Admitting: Hematology and Oncology

## 2024-08-25 DIAGNOSIS — C8338 Diffuse large B-cell lymphoma, lymph nodes of multiple sites: Secondary | ICD-10-CM

## 2024-08-27 ENCOUNTER — Other Ambulatory Visit (HOSPITAL_BASED_OUTPATIENT_CLINIC_OR_DEPARTMENT_OTHER): Payer: Self-pay | Admitting: Internal Medicine

## 2024-08-27 DIAGNOSIS — Z1231 Encounter for screening mammogram for malignant neoplasm of breast: Secondary | ICD-10-CM

## 2024-09-05 DIAGNOSIS — Z006 Encounter for examination for normal comparison and control in clinical research program: Secondary | ICD-10-CM | POA: Diagnosis not present

## 2024-09-05 DIAGNOSIS — C8333 Diffuse large B-cell lymphoma, intra-abdominal lymph nodes: Secondary | ICD-10-CM | POA: Diagnosis not present

## 2024-09-05 DIAGNOSIS — Z9484 Stem cells transplant status: Secondary | ICD-10-CM | POA: Diagnosis not present

## 2024-09-11 ENCOUNTER — Ambulatory Visit: Admitting: Oncology

## 2024-09-11 ENCOUNTER — Ambulatory Visit (INDEPENDENT_AMBULATORY_CARE_PROVIDER_SITE_OTHER)
Admission: RE | Admit: 2024-09-11 | Discharge: 2024-09-11 | Disposition: A | Discharge status: Home / Self Care | Source: Ambulatory Visit | Attending: Hematology and Oncology | Admitting: Hematology and Oncology

## 2024-09-11 ENCOUNTER — Inpatient Hospital Stay

## 2024-09-11 DIAGNOSIS — D1803 Hemangioma of intra-abdominal structures: Secondary | ICD-10-CM | POA: Diagnosis not present

## 2024-09-11 DIAGNOSIS — C8338 Diffuse large B-cell lymphoma, lymph nodes of multiple sites: Secondary | ICD-10-CM | POA: Diagnosis not present

## 2024-09-11 DIAGNOSIS — Z8572 Personal history of non-Hodgkin lymphomas: Secondary | ICD-10-CM | POA: Diagnosis not present

## 2024-09-11 MED ORDER — GADOBUTROL 1 MMOL/ML IV SOLN
7.5000 mL | Freq: Once | INTRAVENOUS | Status: AC | PRN
  Administered 2024-09-11: 7.5 mL via INTRAVENOUS

## 2024-09-12 ENCOUNTER — Telehealth: Payer: Self-pay

## 2024-09-12 NOTE — Telephone Encounter (Signed)
 Per Dr. Cornelius scans showed arthritis. Attempted to contact patient. No answer.

## 2024-09-23 NOTE — Progress Notes (Unsigned)
 Jenna Cordella LABOR, MD  Baldwin Rosella L Do not schedule.  This is simply arthritic changes.  Not an enlarged lymph node.  MRI and CT show no lymph nodes to biopsy.       Previous Messages    ----- Message ----- From: Baldwin Rosella CROME Sent: 09/23/2024  10:01 AM EDT To: Rosella CROME Baldwin; Ir Procedure Requests Subject: CT US  GUIDED BIOPSY                            Procedure :CT US  GUIDED BIOPSY  Reason :Right clavicular nodule Dx: Diffuse large B-cell lymphoma of lymph nodes of multiple regions (HCC) [C83.38 (ICD-10-CM)]    History :MR CHEST W WO CONTRAST,US  SOFT TISSUE HEAD & NECK,CT HEAD W & WO CONTRAST,CT CHEST ABDOMEN PELVIS W CONTRAST  Provider: Harl Eleanor LABOR, NP  Provider contact ; [phone]

## 2024-10-02 DIAGNOSIS — Z23 Encounter for immunization: Secondary | ICD-10-CM | POA: Diagnosis not present

## 2024-10-07 ENCOUNTER — Encounter: Payer: Self-pay | Admitting: Oncology

## 2024-10-30 ENCOUNTER — Inpatient Hospital Stay: Attending: Hematology and Oncology | Admitting: Oncology

## 2024-10-30 ENCOUNTER — Encounter: Payer: Self-pay | Admitting: Oncology

## 2024-10-30 ENCOUNTER — Telehealth: Payer: Self-pay | Admitting: Oncology

## 2024-10-30 ENCOUNTER — Inpatient Hospital Stay

## 2024-10-30 VITALS — BP 148/72 | HR 66 | Temp 97.8°F | Resp 16 | Ht 58.2 in | Wt 172.3 lb

## 2024-10-30 DIAGNOSIS — C8338 Diffuse large B-cell lymphoma, lymph nodes of multiple sites: Secondary | ICD-10-CM | POA: Diagnosis not present

## 2024-10-30 DIAGNOSIS — M47812 Spondylosis without myelopathy or radiculopathy, cervical region: Secondary | ICD-10-CM | POA: Diagnosis not present

## 2024-10-30 DIAGNOSIS — M50322 Other cervical disc degeneration at C5-C6 level: Secondary | ICD-10-CM | POA: Insufficient documentation

## 2024-10-30 DIAGNOSIS — D1803 Hemangioma of intra-abdominal structures: Secondary | ICD-10-CM | POA: Insufficient documentation

## 2024-10-30 DIAGNOSIS — Z9221 Personal history of antineoplastic chemotherapy: Secondary | ICD-10-CM | POA: Insufficient documentation

## 2024-10-30 DIAGNOSIS — C833A Diffuse large b-cell lymphoma, in remission: Secondary | ICD-10-CM | POA: Diagnosis present

## 2024-10-30 DIAGNOSIS — Z9484 Stem cells transplant status: Secondary | ICD-10-CM | POA: Diagnosis not present

## 2024-10-30 LAB — CBC WITH DIFFERENTIAL (CANCER CENTER ONLY)
Abs Immature Granulocytes: 0.01 K/uL (ref 0.00–0.07)
Basophils Absolute: 0.1 K/uL (ref 0.0–0.1)
Basophils Relative: 1 %
Eosinophils Absolute: 0.2 K/uL (ref 0.0–0.5)
Eosinophils Relative: 3 %
HCT: 41 % (ref 36.0–46.0)
Hemoglobin: 13.9 g/dL (ref 12.0–15.0)
Immature Granulocytes: 0 %
Lymphocytes Relative: 32 %
Lymphs Abs: 2 K/uL (ref 0.7–4.0)
MCH: 33.3 pg (ref 26.0–34.0)
MCHC: 33.9 g/dL (ref 30.0–36.0)
MCV: 98.3 fL (ref 80.0–100.0)
Monocytes Absolute: 0.5 K/uL (ref 0.1–1.0)
Monocytes Relative: 7 %
Neutro Abs: 3.6 K/uL (ref 1.7–7.7)
Neutrophils Relative %: 57 %
Platelet Count: 197 K/uL (ref 150–400)
RBC: 4.17 MIL/uL (ref 3.87–5.11)
RDW: 12.5 % (ref 11.5–15.5)
WBC Count: 6.2 K/uL (ref 4.0–10.5)
nRBC: 0 % (ref 0.0–0.2)

## 2024-10-30 LAB — CMP (CANCER CENTER ONLY)
ALT: 10 U/L (ref 0–44)
AST: 15 U/L (ref 15–41)
Albumin: 4.1 g/dL (ref 3.5–5.0)
Alkaline Phosphatase: 82 U/L (ref 38–126)
Anion gap: 11 (ref 5–15)
BUN: 16 mg/dL (ref 8–23)
CO2: 26 mmol/L (ref 22–32)
Calcium: 9.5 mg/dL (ref 8.9–10.3)
Chloride: 105 mmol/L (ref 98–111)
Creatinine: 0.98 mg/dL (ref 0.44–1.00)
GFR, Estimated: 59 mL/min — ABNORMAL LOW (ref >60)
Glucose, Bld: 135 mg/dL — ABNORMAL HIGH (ref 70–99)
Potassium: 4 mmol/L (ref 3.5–5.1)
Sodium: 142 mmol/L (ref 135–145)
Total Bilirubin: 0.6 mg/dL (ref 0.0–1.2)
Total Protein: 6.7 g/dL (ref 6.5–8.1)

## 2024-10-30 LAB — LACTATE DEHYDROGENASE: LDH: 155 U/L (ref 105–235)

## 2024-10-30 NOTE — Telephone Encounter (Signed)
 Patient has been scheduled for follow-up visit per 10/30/24 LOS.  Pt aware of scheduled appt details.

## 2024-10-30 NOTE — Progress Notes (Signed)
 [facility]  [address] [place],  [place]  [address] [phone]  Clinic Day: 10/30/2024  Referring physician: Jefferey Fitch, MD  ASSESSMENT & PLAN:  Assessment: DLBCL (diffuse large B cell lymphoma) (HCC) Recurrent diffuse large B-cell lymphoma, which was originally diagnosed in September 2018, and treated with 6 cycles of R-CHOP, as well as intrathecal chemotherapy.  She then relapsed in June 2020.  She received 2 cycles of R-ICE, which she tolerated well with an excellent response.  She was essentially in complete remission by PET scan, so underwent autologous stem cell transplant in September 2020, and is now 3 years out.  She completed her ibrutinib clinical trial in September 2021 and had her last follow-up visit in April, 2024.  PET scan in February 2021 remained negative.  CT scans and Labs 05/03/2023 show she remains in remission. CT chest, abdomen, and pelvis done on 04/24/2024 revealed for no evidence of lymphadenopathy or metastatic disease in the chest, abdomen, or pelvis with, an unchanged, large, and partially calcified hemangioma in the central liver dome.    Plan: She had a head and neck soft tissue ultrasound on 08/20/2024 which revealed a nonspecific ovoid hypoechoic 1.8 x 0.8 cm structure which likely communicates with the sternoclavicular joint, raising concern for joint effusion or bursal collection, and degenerative synovial thickening was seen on prior CT. 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 She has a WBC of 6.2, hemoglobin of 13.9, and platelet count of  197,000. Her CMP is completely normal. Her LDH is pending. She would like to know if she can decrease her oral potassium supplements from daily to three times weekly. Her potassium today is stable at 4.0 so I am okay with this. We will recheck her levels at her next appointment. She plans to visit Dr. Jefferey regarding her elevated blood pressure. I advised her to avoid hydrochlorothiazide due to its diuretic effects as it could cause her potassium to decrease. She had her COVID and flu vaccines in October.  She has a bilateral screening mammogram scheduled for 12/08/2024. I will see her back in 6 months with CBC, CMP, LDH, and port flush. The patient understands the plans discussed today and is in agreement with them.  She knows to contact our office if she develops concerns prior to her next appointment.   I provided 21 minutes of face-to-face time during this encounter and > 50% was spent counseling as documented under my assessment and plan.   Kristina VEAR Cornish, MD  [facility] A DEPT OF [facility] [address] [place] [address] Dept: [phone] Dept Fax: [phone]   No orders of the defined types were placed in this encounter.  CHIEF COMPLAINT:  CC: Stage IV high-grade diffuse large B-cell lymphoma  Current Treatment: Observation  HISTORY OF PRESENT ILLNESS:  Kristina Torres is a 78 y.o. female with a history of stage IV high-grade diffuse large B-cell lymphoma initially treated in 2018 with R-CHOP and intrathecal methotrexate , completed in January of 2019. She had a fairly complicated course, but finally recovered.  She was found to have  recurrent disease in June 2020.  PET scan was positive in the retroperitoneal adenopathy but not elsewhere.  She saw Dr. Vaidya at [facility] and she agreed that Almer was a candidate for autologous stem cell transplant.  We were unable to obtain a tissue  confirmation as the only possible approach would be an open laparotomy.  Dr.  Bert has reviewed the case and feels she would need to go to a tertiary care center for this surgery.  We felt the morbidity of that surgery and the delay in starting her treatment was not worth the additional information.  Dr. Vaidya agreed.  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  Dr. Vaidya recommended two cycles of R-ICE followed by a PET scan.  The patient had her first cycle on June 23rd, and she did very well with no nausea, bowel problems, hematuria, or fever.  With her first cycle of R-ICE, her white count did go down to 0.6 and her platelets went as low as 6,000, so she did receive a plateletpheresis.  She had no fever or infection.  She received platelet pheresis in July after her 2nd cycle of R-ICE.  She was seen on August 10th after 2 cycles of R-ICE.  Repeat PET scan, revealed a marked interval response to therapy of the intensely hypermetabolic retroperitoneal abdominal lymphadenopathy seen on the prior PET-CT, as well as revealed a stable size of a calcified precarinal lymph node that reveal low level hypermetabolism.  Bone marrow biopsy done the week prior showed hypercellularity with plenty of iron stores.  In preparation for stem cell collection, she underwent pegfilgrastim  injections August 15th through the 17th and also received Mozobil , followed by stem cell collection.   She underwent stem cell transplant in September 2020.  A Hickman catheter was placed while she was admitted for transplant, but she went into ventricular tachycardia, so this was subsequently removed.  After transplant, she was admitted due to a low blood count and received platelets twice.  When we saw her in September, she was found to have an E coli urinary infection, which was treated with Bactrim DS.  She was placed on Bactrim DS prophylaxis once daily on Mondays, Wednesdays, and  Fridays in October for 3 months.   She was seen again at [facility] in October.  She is on a double blinded clinical trial evaluating ibrutinib versus placebo posttransplant and takes 4 capsules daily.  She saw Dr. Vaidya in December for her day 100.  PET in February 2021 did not reveal any evidence of recurrent disease.  She has had continued routine imaging without evidence of recurrence. She had a CT neck, chest, abdomen and pelvis in February of 2023 at [facility] which did not reveal any evidence of recurrence.  Stable benign hepatic hemangioma was seen.  Colonic diverticulosis was seen.  Calcified uterine leiomyomas were seen.  She is up-to-date on screening mammogram with the last being in December 2022.  INTERVAL HISTORY:  Kristina Torres is here today for repeat clinical assessment for stage IV high-grade diffuse large B-cell lymphoma. Patient states that she feels well and has no complaints of pain. She had a head and neck soft tissue ultrasound on 08/20/2024 which revealed a nonspecific ovoid hypoechoic 1.8 x 0.8 cm structure which likely communicates with the sternoclavicular joint, raising concern for joint effusion or bursal collection, and degenerative synovial thickening was seen on prior CT. 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 She has a WBC of 6.2, hemoglobin of 13.9, and platelet count of 197,000. Her CMP is completely normal. Her LDH is pending. She would like to know if she can decrease her oral potassium supplements from daily to three times weekly. Her potassium today is stable at 4.0 so I am okay  with this. We will recheck her levels at her next appointment. She plans to visit Dr. Jefferey regarding her elevated blood pressure. I advised her to avoid hydrochlorothiazide due to its diuretic effects as it could cause her potassium to decrease. She had her COVID and flu vaccines in October.  She has a bilateral screening mammogram scheduled for 12/08/2024. I will see her back in 6 months with CBC, CMP, LDH, and port flush.  She denies fever, chills, night sweats, or other signs of infection. She denies cardiorespiratory and gastrointestinal issues. She  denies pain. Her appetite is great and Her weight has been stable. She is 172 lb 4.8 oz today.  REVIEW OF SYSTEMS:  Review of Systems  Constitutional: Negative.  Negative for appetite change, chills, diaphoresis, fatigue, fever and unexpected weight change.  HENT:  Negative.  Negative for hearing loss, lump/mass, mouth sores, nosebleeds, sore throat, tinnitus, trouble swallowing and voice change.   Eyes: Negative.  Negative for eye problems and icterus.  Respiratory:  Negative for chest tightness, cough, hemoptysis, shortness of breath and wheezing.   Cardiovascular:  Negative for chest pain, leg swelling and palpitations.  Gastrointestinal:  Negative for abdominal distention, abdominal pain, blood in stool, constipation, diarrhea, nausea, rectal pain and vomiting.  Endocrine: Negative.  Negative for hot flashes.  Genitourinary:  Negative for bladder incontinence, difficulty urinating, dyspareunia, dysuria, frequency, hematuria, menstrual problem, nocturia, pelvic pain, vaginal bleeding and vaginal discharge.   Musculoskeletal: Negative.  Negative for arthralgias, back pain, flank pain, gait problem, myalgias, neck pain and neck stiffness.  Skin: Negative.  Negative for itching, rash and wound.  Neurological:  Positive for numbness (bilateral legs). Negative for dizziness, extremity weakness, gait problem, headaches, light-headedness, seizures  and speech difficulty.  Hematological: Negative.  Negative for adenopathy. Does not bruise/bleed easily.  Psychiatric/Behavioral: Negative.  Negative for confusion, decreased concentration, depression, sleep disturbance and suicidal ideas. The patient is not nervous/anxious.      VITALS:  Blood pressure (!) 148/72, pulse 66, temperature 97.8 F (36.6 C), temperature source Oral, resp. rate 16, height 4' 10.2 (1.478 m), weight 172 lb 4.8 oz (78.2 kg), SpO2 99%.  Wt Readings from Last 3 Encounters:  10/30/24 172 lb 4.8 oz (78.2 kg)  09/11/24 171 lb (77.6 kg)  05/02/24 171 lb 8 oz (77.8 kg)    Body mass index is 35.76 kg/m.  Performance status (ECOG): 0 - Asymptomatic  PHYSICAL EXAM:  Physical Exam Vitals and nursing note reviewed.  Constitutional:      General: She is not in acute distress.    Appearance: Normal appearance. She is normal weight. She is not ill-appearing, toxic-appearing or diaphoretic.  HENT:     Head: Normocephalic and atraumatic.     Right Ear: Tympanic membrane, ear canal and external ear normal. There is no impacted cerumen.  Left Ear: Tympanic membrane, ear canal and external ear normal. There is no impacted cerumen.     Nose: Nose normal. No congestion or rhinorrhea.     Mouth/Throat:     Mouth: Mucous membranes are moist.     Pharynx: Oropharynx is clear. No oropharyngeal exudate or posterior oropharyngeal erythema.  Eyes:     General: No scleral icterus.       Right eye: No discharge.        Left eye: No discharge.     Extraocular Movements: Extraocular movements intact.     Conjunctiva/sclera: Conjunctivae normal.     Pupils: Pupils are equal, round, and reactive to light.  Neck:     Vascular: No carotid bruit.  Cardiovascular:     Rate and Rhythm: Normal rate and regular rhythm.     Pulses: Normal pulses.     Heart sounds: Normal heart sounds. No murmur heard.    No friction rub. No gallop.  Pulmonary:     Effort: Pulmonary effort is normal.  No respiratory distress.     Breath sounds: Normal breath sounds. No stridor. No wheezing, rhonchi or rales.  Chest:     Chest wall: No tenderness.  Abdominal:     General: Bowel sounds are normal. There is no distension.     Palpations: Abdomen is soft. There is no hepatomegaly, splenomegaly or mass.     Tenderness: There is no abdominal tenderness. There is no right CVA tenderness, left CVA tenderness, guarding or rebound.     Hernia: No hernia is present.  Musculoskeletal:        General: No swelling, tenderness, deformity or signs of injury. Normal range of motion.     Cervical back: Normal range of motion and neck supple. No rigidity or tenderness.     Right lower leg: No edema.     Left lower leg: Edema (trace) present.  Lymphadenopathy:     Cervical: No cervical adenopathy.     Right cervical: No superficial, deep or posterior cervical adenopathy.    Left cervical: No superficial, deep or posterior cervical adenopathy.     Upper Body:     Right upper body: No supraclavicular, axillary or pectoral adenopathy.     Left upper body: No supraclavicular, axillary or pectoral adenopathy.     Lower Body: No right inguinal adenopathy. No left inguinal adenopathy.  Skin:    General: Skin is warm and dry.     Coloration: Skin is not jaundiced or pale.     Findings: No bruising, erythema, lesion or rash.  Neurological:     General: No focal deficit present.     Mental Status: She is alert and oriented to person, place, and time. Mental status is at baseline.     Cranial Nerves: No cranial nerve deficit.     Sensory: No sensory deficit.     Motor: No weakness.     Coordination: Coordination normal.     Gait: Gait normal.     Deep Tendon Reflexes: Reflexes normal.  Psychiatric:        Mood and Affect: Mood normal.        Behavior: Behavior normal.        Thought Content: Thought content normal.        Judgment: Judgment normal.     LABS:      Latest Ref Rng & Units 10/30/2024     9:04 AM 08/20/2024    2:54 PM 04/24/2024    3:15 PM  CBC  WBC 4.0 - 10.5 K/uL 6.2  8.1  7.9   Hemoglobin 12.0 - 15.0 g/dL 86.0  86.5  86.7   Hematocrit 36.0 - 46.0 % 41.0  39.6  39.6   Platelets 150 - 400 K/uL 197  206  178       Latest Ref Rng & Units 10/30/2024    9:04 AM 08/20/2024    2:54 PM 04/24/2024    3:15 PM  CMP  Glucose 70 - 99 mg/dL 864  897  881   BUN 8 - 23 mg/dL 16  16  13    Creatinine 0.44 - 1.00 mg/dL 9.01  9.06  9.06   Sodium 135 - 145 mmol/L 142  139  142   Potassium 3.5 - 5.1 mmol/L 4.0  4.0  3.5   Chloride 98 - 111 mmol/L 105  105  106   CO2 22 - 32 mmol/L 26  23  24    Calcium  8.9 - 10.3 mg/dL 9.5  9.7  9.4   Total Protein 6.5 - 8.1 g/dL 6.7  6.9  6.3   Total Bilirubin 0.0 - 1.2 mg/dL 0.6  0.6  0.4   Alkaline Phos 38 - 126 U/L 82  77  78   AST 15 - 41 U/L 15  18  10    ALT 0 - 44 U/L 10  14  6     Lab Results  Component Value Date   TSH 1.27 05/01/2023    No results found for: CEA1, CEA / No results found for: CEA1, CEA No results found for: PSA1 No results found for: CAN199 No results found for: RJW874  Lab Results  Component Value Date   TOTALPROTELP 3.5 (L) 08/24/2017   Lab Results  Component Value Date   TIBC 214 (L) 08/21/2017   FERRITIN 745 (H) 08/21/2017   IRONPCTSAT 14 08/21/2017   Lab Results  Component Value Date   LDH 155 10/30/2024   LDH 161 08/20/2024   LDH 154 04/24/2024    STUDIES:  EXAM: 09/11/2024 MRI OF THE CHEST WITH AND WITHOUT CONTRAST IMPRESSION: 1. Asymmetric degenerative arthropathy of the right sternoclavicular joint without effusion or features of septic arthropathy. This corresponds to the region of concern. 2. Cervical spondylosis and degenerative disc disease at C5-6 and C6-7. 3. Partial anomalous pulmonary venous return to the left brachiocephalic vein. 4. Known hemangioma in the dome of the right hepatic lobe measuring 6.4 cm in long axis.  EXAM: 08/20/2024 ULTRASOUND OF HEAD/NECK SOFT  TISSUES IMPRESSION: Nonspecific ovoid hypoechoic 1.8 x 0.8 cm structure likely communicates with the sternoclavicular joint, raising concern for joint effusion or bursal collection. Degenerative synovial thickening was seen on prior CT. This could be further assessed with MRI as clinically indicated.  EXAM: 04/24/2024 CT HEAD WITH & WITHOUT CONTRAST IMPRESSION: Pending result  EXAM: 04/24/2024 CT CHEST, ABDOMEN, AND PELVIS WITH CONTRAST IMPRESSION: 1. No evidence of lymphadenopathy or metastatic disease in the chest, abdomen, or pelvis. 2. Unchanged, large, partially calcified hemangioma in the central liver dome. 3. Descending and sigmoid diverticulosis without evidence of acute diverticulitis. 4. Calcified uterine fibroids. 5. 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   HISTORY:   Past Medical History:  Diagnosis Date   Acute urinary retention 08/29/2017   AKI (acute kidney injury) 08/20/2017   Aortic atherosclerosis    Bilateral pleural effusion    Cataracts, bilateral    Cytopenia    DLBCL (diffuse large B cell  lymphoma) (HCC) 09/03/2017   non hodkins lymphoma right kidney and ureter   Essential hypertension    hx of off bp meds sept 2, 2018 due to weight loss   Gout    Grade I diastolic dysfunction    Heart murmur    remote history   History of blood transfusion 02/2018   History of bronchitis    History of chemotherapy finsished chemo Dec 19, 2017   History of constipation    HLD (hyperlipidemia)    Hydronephrosis    Right secondary to bulky retroperitoneal lymphadenopathy   Hyperbilirubinemia 08/29/2017   Hypercalcemia 08/20/2017   Hypomagnesemia 08/29/2017   Hypophosphatemia 08/29/2017   Hypotension 08/20/2017   Localized swelling of lower extremity    left lower leg keeps elevated   Lymphadenopathy    Macrocytic anemia 08/21/2017   Nausea & vomiting 08/20/2017   Neuromuscular disorder (HCC)     neuropathy all over from chemo   Obesity    Port-A-Cath in place    Rheumatoid arthritis (HCC)    Right renal mass 08/2017   Rotator cuff tear    left   Sepsis (HCC) 08/20/2017   Splenomegaly    Thrombocytopenia    Ureteral obstruction, right    Malignate    Past Surgical History:  Procedure Laterality Date   APPENDECTOMY  age 46   BONE MARROW BIOPSY  08/31/2017   BREAST SURGERY Right 30 yrs ago   benign   CARPAL TUNNEL RELEASE Bilateral    CATARACT EXTRACTION, BILATERAL     02/2018: right 03/2018 left   colonscopy     CYSTOSCOPY W/ URETERAL STENT PLACEMENT Right 02/18/2018   Procedure: CYSTOSCOPY WITH RETROGRADE PYELOGRAM/URETERAL STENT EXCHANGE;  Surgeon: Ottelin, Mark, MD;  Location: [facility];  Service: Urology;  Laterality: Right;   CYSTOSCOPY W/ URETERAL STENT PLACEMENT Right 08/02/2018   Procedure: CYSTOSCOPY WITH STENT REMOVAL AND REPLACEMENT;  Surgeon: Ottelin, Mark, MD;  Location: [facility];  Service: Urology;  Laterality: Right;   CYSTOSCOPY W/ URETERAL STENT PLACEMENT Right 12/30/2018   Procedure: CYSTOSCOPY, RETROGRADE  WITH STENT REMOVAL;  Surgeon: Ottelin, Mark, MD;  Location: [facility];  Service: Urology;  Laterality: Right;   IR FLUORO GUIDE PORT INSERTION RIGHT  09/06/2017   IR NEPHROSTOMY EXCHANGE RIGHT  08/31/2017   IR NEPHROSTOMY EXCHANGE RIGHT  09/11/2017   IR NEPHROSTOMY PLACEMENT RIGHT  08/21/2017   IR URETERAL STENT PLACEMENT EXISTING ACCESS RIGHT  11/19/2017   IR US  GUIDE VASC ACCESS RIGHT  09/06/2017   LAPAROSCOPIC SALPINGO OOPHERECTOMY Right    Left supraclavicular lymph node biopsy  08/24/2017   retroperitioneal lymph node biopsy   09/02/2017   THORACENTESIS Left 09/20/2017    Family History  Problem Relation Age of Onset   CVA Mother     Social History:  reports that she quit smoking about 7 years ago. Her smoking use included cigarettes. She started smoking about 57 years ago. She has a 25 pack-year  smoking history. She has never used smokeless tobacco. She reports current alcohol  use. She reports that she does not use drugs.The patient is alone.  Her labs are unremarkable so there everything is fair we will do but will work on her do maybe I did I will now today.  Allergies:  Allergies  Allergen Reactions   Midazolam  Other (See Comments)    Cannot use versed  per dr towana gi doctor  Other reaction(s): Other   Streptomycin Anaphylaxis, Other (See Comments) and Swelling  Numb tongue, fingers and mouth  Other reaction(s): Other  NUMBMESS OF MOUTH, TONGUE AND HANDS   Other reaction(s): facial swelling    Current Medications: Current Outpatient Medications  Medication Sig Dispense Refill   allopurinol  (ZYLOPRIM ) 300 MG tablet Take 300 mg by mouth as directed. Mon-Fri     Cholecalciferol 25 MCG (1000 UT) tablet Take by mouth. 5 days a week     diphenoxylate -atropine  (LOMOTIL ) 2.5-0.025 MG tablet Take 1 tablet by mouth 4 (four) times daily as needed. (Patient not taking: Reported on 08/20/2024)     furosemide  (LASIX ) 20 MG tablet Take 20 mg by mouth as needed. (Patient not taking: Reported on 08/20/2024)     potassium chloride  (MICRO-K ) 10 MEQ CR capsule TAKE 1 CAPSULE BY MOUTH EVERY DAY 90 capsule 1   simvastatin  (ZOCOR ) 20 MG tablet Take by mouth. 5 days a week     traMADol  (ULTRAM ) 50 MG tablet Take 1 tablet by mouth as needed. (Patient not taking: Reported on 08/20/2024)     valACYclovir  (VALTREX ) 500 MG tablet Take 500 mg by mouth 2 (two) times daily as needed.     No current facility-administered medications for this visit.   I,Kashayla Ungerer H Shanti Eichel,acting as a scribe for Kristina VEAR Cornish, MD.,have documented all relevant documentation on the behalf of Kristina VEAR Cornish, MD,as directed by  Kristina VEAR Cornish, MD while in the presence of Kristina VEAR Cornish, MD.  I have reviewed this report as typed by the medical scribe, and it is complete and accurate.

## 2024-11-13 ENCOUNTER — Encounter: Payer: Self-pay | Admitting: Oncology

## 2024-12-08 ENCOUNTER — Inpatient Hospital Stay (HOSPITAL_BASED_OUTPATIENT_CLINIC_OR_DEPARTMENT_OTHER): Admission: RE | Admit: 2024-12-08 | Discharge: 2024-12-08 | Attending: Internal Medicine | Admitting: Internal Medicine

## 2024-12-08 DIAGNOSIS — Z1231 Encounter for screening mammogram for malignant neoplasm of breast: Secondary | ICD-10-CM

## 2024-12-15 ENCOUNTER — Other Ambulatory Visit (HOSPITAL_BASED_OUTPATIENT_CLINIC_OR_DEPARTMENT_OTHER): Payer: Self-pay | Admitting: Internal Medicine

## 2024-12-15 ENCOUNTER — Encounter (HOSPITAL_BASED_OUTPATIENT_CLINIC_OR_DEPARTMENT_OTHER): Payer: Self-pay | Admitting: Internal Medicine

## 2024-12-15 DIAGNOSIS — R928 Other abnormal and inconclusive findings on diagnostic imaging of breast: Secondary | ICD-10-CM

## 2024-12-26 ENCOUNTER — Ambulatory Visit
Admission: RE | Admit: 2024-12-26 | Discharge: 2024-12-26 | Disposition: A | Source: Ambulatory Visit | Attending: Internal Medicine | Admitting: Internal Medicine

## 2024-12-26 DIAGNOSIS — R928 Other abnormal and inconclusive findings on diagnostic imaging of breast: Secondary | ICD-10-CM

## 2024-12-29 ENCOUNTER — Other Ambulatory Visit

## 2024-12-29 ENCOUNTER — Encounter

## 2025-01-21 ENCOUNTER — Other Ambulatory Visit: Payer: Self-pay | Admitting: Oncology

## 2025-01-21 DIAGNOSIS — E876 Hypokalemia: Secondary | ICD-10-CM

## 2025-01-22 ENCOUNTER — Encounter: Payer: Self-pay | Admitting: Oncology

## 2025-04-29 ENCOUNTER — Inpatient Hospital Stay: Admitting: Oncology

## 2025-04-29 ENCOUNTER — Inpatient Hospital Stay
